# Patient Record
Sex: Female | Born: 1935 | Race: White | Hispanic: No | Smoking: Never smoker
Health system: Southern US, Community
[De-identification: ages and names within clinical notes are randomized; demographics above are authoritative.]

## PROBLEM LIST (undated history)

## (undated) DIAGNOSIS — M1990 Unspecified osteoarthritis, unspecified site: Secondary | ICD-10-CM

## (undated) DIAGNOSIS — K8020 Calculus of gallbladder without cholecystitis without obstruction: Secondary | ICD-10-CM

## (undated) DIAGNOSIS — K224 Dyskinesia of esophagus: Secondary | ICD-10-CM

## (undated) DIAGNOSIS — K589 Irritable bowel syndrome without diarrhea: Secondary | ICD-10-CM

## (undated) DIAGNOSIS — Z9889 Other specified postprocedural states: Secondary | ICD-10-CM

## (undated) DIAGNOSIS — Z8711 Personal history of peptic ulcer disease: Secondary | ICD-10-CM

## (undated) DIAGNOSIS — Z859 Personal history of malignant neoplasm, unspecified: Secondary | ICD-10-CM

## (undated) DIAGNOSIS — Z872 Personal history of diseases of the skin and subcutaneous tissue: Secondary | ICD-10-CM

## (undated) DIAGNOSIS — Z87442 Personal history of urinary calculi: Secondary | ICD-10-CM

## (undated) DIAGNOSIS — K5790 Diverticulosis of intestine, part unspecified, without perforation or abscess without bleeding: Secondary | ICD-10-CM

## (undated) DIAGNOSIS — I10 Essential (primary) hypertension: Secondary | ICD-10-CM

## (undated) DIAGNOSIS — M797 Fibromyalgia: Secondary | ICD-10-CM

## (undated) DIAGNOSIS — F419 Anxiety disorder, unspecified: Secondary | ICD-10-CM

## (undated) DIAGNOSIS — K311 Adult hypertrophic pyloric stenosis: Secondary | ICD-10-CM

## (undated) DIAGNOSIS — L03115 Cellulitis of right lower limb: Secondary | ICD-10-CM

## (undated) DIAGNOSIS — Z8719 Personal history of other diseases of the digestive system: Secondary | ICD-10-CM

## (undated) DIAGNOSIS — K635 Polyp of colon: Secondary | ICD-10-CM

## (undated) DIAGNOSIS — I639 Cerebral infarction, unspecified: Secondary | ICD-10-CM

## (undated) DIAGNOSIS — J309 Allergic rhinitis, unspecified: Secondary | ICD-10-CM

## (undated) DIAGNOSIS — E7800 Pure hypercholesterolemia, unspecified: Secondary | ICD-10-CM

## (undated) DIAGNOSIS — M719 Bursopathy, unspecified: Secondary | ICD-10-CM

## (undated) DIAGNOSIS — J189 Pneumonia, unspecified organism: Secondary | ICD-10-CM

## (undated) DIAGNOSIS — K219 Gastro-esophageal reflux disease without esophagitis: Secondary | ICD-10-CM

## (undated) HISTORY — DX: Personal history of malignant neoplasm, unspecified: Z98.890

## (undated) HISTORY — PX: DIAGNOSTIC MAMMOGRAM: HXRAD719

## (undated) HISTORY — DX: Calculus of gallbladder without cholecystitis without obstruction: K80.20

## (undated) HISTORY — DX: Personal history of diseases of the skin and subcutaneous tissue: Z87.2

## (undated) HISTORY — DX: Adult hypertrophic pyloric stenosis: K31.1

## (undated) HISTORY — DX: Polyp of colon: K63.5

## (undated) HISTORY — PX: CATARACT EXTRACTION W/ INTRAOCULAR LENS  IMPLANT, BILATERAL: SHX1307

## (undated) HISTORY — DX: Allergic rhinitis, unspecified: J30.9

## (undated) HISTORY — DX: Diverticulosis of intestine, part unspecified, without perforation or abscess without bleeding: K57.90

## (undated) HISTORY — DX: Personal history of malignant neoplasm, unspecified: Z85.9

## (undated) HISTORY — DX: Other specified postprocedural states: Z98.890

## (undated) HISTORY — DX: Dyskinesia of esophagus: K22.4

## (undated) HISTORY — PX: CHOLECYSTECTOMY OPEN: SUR202

## (undated) HISTORY — PX: TONSILLECTOMY: SUR1361

## (undated) HISTORY — DX: Irritable bowel syndrome, unspecified: K58.9

## (undated) HISTORY — PX: COLONOSCOPY: SHX174

## (undated) HISTORY — PX: APPENDECTOMY: SHX54

---

## 2014-10-04 DIAGNOSIS — I639 Cerebral infarction, unspecified: Secondary | ICD-10-CM

## 2014-10-04 HISTORY — DX: Cerebral infarction, unspecified: I63.9

## 2016-01-20 DIAGNOSIS — M797 Fibromyalgia: Secondary | ICD-10-CM | POA: Diagnosis not present

## 2016-01-20 DIAGNOSIS — K219 Gastro-esophageal reflux disease without esophagitis: Secondary | ICD-10-CM | POA: Diagnosis not present

## 2016-01-20 DIAGNOSIS — R1310 Dysphagia, unspecified: Secondary | ICD-10-CM | POA: Diagnosis not present

## 2016-01-20 DIAGNOSIS — M3500 Sicca syndrome, unspecified: Secondary | ICD-10-CM | POA: Diagnosis not present

## 2016-01-24 DIAGNOSIS — M160 Bilateral primary osteoarthritis of hip: Secondary | ICD-10-CM | POA: Diagnosis not present

## 2016-01-24 DIAGNOSIS — M797 Fibromyalgia: Secondary | ICD-10-CM | POA: Diagnosis not present

## 2016-01-24 DIAGNOSIS — M3500 Sicca syndrome, unspecified: Secondary | ICD-10-CM | POA: Diagnosis not present

## 2016-01-24 DIAGNOSIS — M7061 Trochanteric bursitis, right hip: Secondary | ICD-10-CM | POA: Diagnosis not present

## 2016-02-07 DIAGNOSIS — E7800 Pure hypercholesterolemia, unspecified: Secondary | ICD-10-CM | POA: Diagnosis not present

## 2016-02-14 DIAGNOSIS — R0609 Other forms of dyspnea: Secondary | ICD-10-CM | POA: Diagnosis not present

## 2016-02-14 DIAGNOSIS — I10 Essential (primary) hypertension: Secondary | ICD-10-CM | POA: Diagnosis not present

## 2016-02-14 DIAGNOSIS — J849 Interstitial pulmonary disease, unspecified: Secondary | ICD-10-CM | POA: Diagnosis not present

## 2016-02-14 DIAGNOSIS — E7800 Pure hypercholesterolemia, unspecified: Secondary | ICD-10-CM | POA: Diagnosis not present

## 2016-02-15 LAB — BASIC METABOLIC PANEL
BUN: 18 mg/dL (ref 4–21)
CREATININE: 0.7 mg/dL (ref 0.5–1.1)
Glucose: 93 mg/dL
Potassium: 4 mmol/L (ref 3.4–5.3)
Sodium: 139 mmol/L (ref 137–147)

## 2016-02-15 LAB — LIPID PANEL
HDL: 99 mg/dL — AB (ref 35–70)
LDL CALC: 114 mg/dL
Triglycerides: 140 mg/dL (ref 40–160)

## 2016-02-15 LAB — TSH: TSH: 1.11 u[IU]/mL (ref 0.41–5.90)

## 2016-02-15 LAB — CBC AND DIFFERENTIAL
HCT: 40 % (ref 36–46)
HEMOGLOBIN: 13.2 g/dL (ref 12.0–16.0)
Neutrophils Absolute: 6 /uL
PLATELETS: 233 10*3/uL (ref 150–399)
WBC: 8.9 10*3/mL

## 2016-02-15 LAB — HEPATIC FUNCTION PANEL
ALK PHOS: 57 U/L (ref 25–125)
ALT: 25 U/L (ref 7–35)
AST: 23 U/L (ref 13–35)
Bilirubin, Total: 0.4 mg/dL

## 2016-02-22 DIAGNOSIS — I351 Nonrheumatic aortic (valve) insufficiency: Secondary | ICD-10-CM | POA: Diagnosis not present

## 2016-02-22 DIAGNOSIS — I272 Other secondary pulmonary hypertension: Secondary | ICD-10-CM | POA: Diagnosis not present

## 2016-02-22 DIAGNOSIS — I340 Nonrheumatic mitral (valve) insufficiency: Secondary | ICD-10-CM | POA: Diagnosis not present

## 2016-02-22 DIAGNOSIS — R0609 Other forms of dyspnea: Secondary | ICD-10-CM | POA: Diagnosis not present

## 2016-02-23 DIAGNOSIS — M25551 Pain in right hip: Secondary | ICD-10-CM | POA: Diagnosis not present

## 2016-02-23 DIAGNOSIS — M3500 Sicca syndrome, unspecified: Secondary | ICD-10-CM | POA: Diagnosis not present

## 2016-02-23 DIAGNOSIS — M160 Bilateral primary osteoarthritis of hip: Secondary | ICD-10-CM | POA: Diagnosis not present

## 2016-02-23 DIAGNOSIS — G5700 Lesion of sciatic nerve, unspecified lower limb: Secondary | ICD-10-CM | POA: Diagnosis not present

## 2016-02-23 DIAGNOSIS — M62838 Other muscle spasm: Secondary | ICD-10-CM | POA: Diagnosis not present

## 2016-02-23 DIAGNOSIS — M25552 Pain in left hip: Secondary | ICD-10-CM | POA: Diagnosis not present

## 2016-02-23 DIAGNOSIS — M7630 Iliotibial band syndrome, unspecified leg: Secondary | ICD-10-CM | POA: Diagnosis not present

## 2016-02-23 DIAGNOSIS — M797 Fibromyalgia: Secondary | ICD-10-CM | POA: Diagnosis not present

## 2016-02-23 DIAGNOSIS — M7061 Trochanteric bursitis, right hip: Secondary | ICD-10-CM | POA: Diagnosis not present

## 2016-03-06 DIAGNOSIS — H35363 Drusen (degenerative) of macula, bilateral: Secondary | ICD-10-CM | POA: Diagnosis not present

## 2016-03-06 DIAGNOSIS — H43812 Vitreous degeneration, left eye: Secondary | ICD-10-CM | POA: Diagnosis not present

## 2016-03-06 DIAGNOSIS — H16213 Exposure keratoconjunctivitis, bilateral: Secondary | ICD-10-CM | POA: Diagnosis not present

## 2016-03-06 DIAGNOSIS — Z961 Presence of intraocular lens: Secondary | ICD-10-CM | POA: Diagnosis not present

## 2016-03-06 DIAGNOSIS — H10503 Unspecified blepharoconjunctivitis, bilateral: Secondary | ICD-10-CM | POA: Diagnosis not present

## 2016-03-06 DIAGNOSIS — H04123 Dry eye syndrome of bilateral lacrimal glands: Secondary | ICD-10-CM | POA: Diagnosis not present

## 2016-03-07 DIAGNOSIS — L578 Other skin changes due to chronic exposure to nonionizing radiation: Secondary | ICD-10-CM | POA: Diagnosis not present

## 2016-03-07 DIAGNOSIS — I10 Essential (primary) hypertension: Secondary | ICD-10-CM | POA: Diagnosis not present

## 2016-03-07 DIAGNOSIS — L820 Inflamed seborrheic keratosis: Secondary | ICD-10-CM | POA: Diagnosis not present

## 2016-03-07 DIAGNOSIS — L570 Actinic keratosis: Secondary | ICD-10-CM | POA: Diagnosis not present

## 2016-03-13 DIAGNOSIS — R0609 Other forms of dyspnea: Secondary | ICD-10-CM | POA: Diagnosis not present

## 2016-03-23 DIAGNOSIS — M62838 Other muscle spasm: Secondary | ICD-10-CM | POA: Diagnosis not present

## 2016-03-23 DIAGNOSIS — M797 Fibromyalgia: Secondary | ICD-10-CM | POA: Diagnosis not present

## 2016-03-23 DIAGNOSIS — M7061 Trochanteric bursitis, right hip: Secondary | ICD-10-CM | POA: Diagnosis not present

## 2016-03-23 DIAGNOSIS — M160 Bilateral primary osteoarthritis of hip: Secondary | ICD-10-CM | POA: Diagnosis not present

## 2016-03-23 DIAGNOSIS — G5700 Lesion of sciatic nerve, unspecified lower limb: Secondary | ICD-10-CM | POA: Diagnosis not present

## 2016-03-23 DIAGNOSIS — M7630 Iliotibial band syndrome, unspecified leg: Secondary | ICD-10-CM | POA: Diagnosis not present

## 2016-03-23 DIAGNOSIS — M3500 Sicca syndrome, unspecified: Secondary | ICD-10-CM | POA: Diagnosis not present

## 2016-04-03 HISTORY — PX: CARDIOVASCULAR STRESS TEST: SHX262

## 2016-04-05 DIAGNOSIS — E785 Hyperlipidemia, unspecified: Secondary | ICD-10-CM | POA: Diagnosis not present

## 2016-04-05 DIAGNOSIS — I340 Nonrheumatic mitral (valve) insufficiency: Secondary | ICD-10-CM | POA: Diagnosis not present

## 2016-04-05 DIAGNOSIS — I350 Nonrheumatic aortic (valve) stenosis: Secondary | ICD-10-CM | POA: Diagnosis not present

## 2016-04-05 DIAGNOSIS — I10 Essential (primary) hypertension: Secondary | ICD-10-CM | POA: Diagnosis not present

## 2016-04-05 DIAGNOSIS — R0600 Dyspnea, unspecified: Secondary | ICD-10-CM | POA: Diagnosis not present

## 2016-04-05 DIAGNOSIS — R079 Chest pain, unspecified: Secondary | ICD-10-CM | POA: Diagnosis not present

## 2016-04-05 DIAGNOSIS — I351 Nonrheumatic aortic (valve) insufficiency: Secondary | ICD-10-CM | POA: Diagnosis not present

## 2016-04-18 DIAGNOSIS — R0602 Shortness of breath: Secondary | ICD-10-CM | POA: Diagnosis not present

## 2016-04-18 DIAGNOSIS — R079 Chest pain, unspecified: Secondary | ICD-10-CM | POA: Diagnosis not present

## 2016-04-18 DIAGNOSIS — R0600 Dyspnea, unspecified: Secondary | ICD-10-CM | POA: Diagnosis not present

## 2016-05-04 DIAGNOSIS — M797 Fibromyalgia: Secondary | ICD-10-CM | POA: Diagnosis not present

## 2016-05-04 DIAGNOSIS — G5700 Lesion of sciatic nerve, unspecified lower limb: Secondary | ICD-10-CM | POA: Diagnosis not present

## 2016-05-04 DIAGNOSIS — M160 Bilateral primary osteoarthritis of hip: Secondary | ICD-10-CM | POA: Diagnosis not present

## 2016-05-04 DIAGNOSIS — M7630 Iliotibial band syndrome, unspecified leg: Secondary | ICD-10-CM | POA: Diagnosis not present

## 2016-05-04 DIAGNOSIS — M3500 Sicca syndrome, unspecified: Secondary | ICD-10-CM | POA: Diagnosis not present

## 2016-05-04 DIAGNOSIS — M7061 Trochanteric bursitis, right hip: Secondary | ICD-10-CM | POA: Diagnosis not present

## 2016-05-04 DIAGNOSIS — M62838 Other muscle spasm: Secondary | ICD-10-CM | POA: Diagnosis not present

## 2016-05-09 DIAGNOSIS — I350 Nonrheumatic aortic (valve) stenosis: Secondary | ICD-10-CM | POA: Diagnosis not present

## 2016-05-09 DIAGNOSIS — R0600 Dyspnea, unspecified: Secondary | ICD-10-CM | POA: Diagnosis not present

## 2016-05-09 DIAGNOSIS — I10 Essential (primary) hypertension: Secondary | ICD-10-CM | POA: Diagnosis not present

## 2016-05-09 DIAGNOSIS — E785 Hyperlipidemia, unspecified: Secondary | ICD-10-CM | POA: Diagnosis not present

## 2016-05-09 DIAGNOSIS — R079 Chest pain, unspecified: Secondary | ICD-10-CM | POA: Diagnosis not present

## 2016-05-09 DIAGNOSIS — I340 Nonrheumatic mitral (valve) insufficiency: Secondary | ICD-10-CM | POA: Diagnosis not present

## 2016-05-09 DIAGNOSIS — I351 Nonrheumatic aortic (valve) insufficiency: Secondary | ICD-10-CM | POA: Diagnosis not present

## 2016-05-15 DIAGNOSIS — R1310 Dysphagia, unspecified: Secondary | ICD-10-CM | POA: Diagnosis not present

## 2016-05-15 DIAGNOSIS — I10 Essential (primary) hypertension: Secondary | ICD-10-CM | POA: Diagnosis not present

## 2016-05-15 DIAGNOSIS — M3509 Sicca syndrome with other organ involvement: Secondary | ICD-10-CM | POA: Diagnosis not present

## 2016-05-15 DIAGNOSIS — K219 Gastro-esophageal reflux disease without esophagitis: Secondary | ICD-10-CM | POA: Diagnosis not present

## 2016-06-14 DIAGNOSIS — Z79899 Other long term (current) drug therapy: Secondary | ICD-10-CM | POA: Diagnosis not present

## 2016-06-15 DIAGNOSIS — R0600 Dyspnea, unspecified: Secondary | ICD-10-CM | POA: Diagnosis not present

## 2016-06-15 DIAGNOSIS — I10 Essential (primary) hypertension: Secondary | ICD-10-CM | POA: Diagnosis not present

## 2016-06-15 DIAGNOSIS — K219 Gastro-esophageal reflux disease without esophagitis: Secondary | ICD-10-CM | POA: Diagnosis not present

## 2016-06-15 DIAGNOSIS — R079 Chest pain, unspecified: Secondary | ICD-10-CM | POA: Diagnosis not present

## 2016-06-15 DIAGNOSIS — I351 Nonrheumatic aortic (valve) insufficiency: Secondary | ICD-10-CM | POA: Diagnosis not present

## 2016-06-15 DIAGNOSIS — I340 Nonrheumatic mitral (valve) insufficiency: Secondary | ICD-10-CM | POA: Diagnosis not present

## 2016-06-15 DIAGNOSIS — I350 Nonrheumatic aortic (valve) stenosis: Secondary | ICD-10-CM | POA: Diagnosis not present

## 2016-06-15 DIAGNOSIS — E785 Hyperlipidemia, unspecified: Secondary | ICD-10-CM | POA: Diagnosis not present

## 2016-06-19 DIAGNOSIS — M797 Fibromyalgia: Secondary | ICD-10-CM | POA: Diagnosis not present

## 2016-06-19 DIAGNOSIS — M160 Bilateral primary osteoarthritis of hip: Secondary | ICD-10-CM | POA: Diagnosis not present

## 2016-06-19 DIAGNOSIS — M7630 Iliotibial band syndrome, unspecified leg: Secondary | ICD-10-CM | POA: Diagnosis not present

## 2016-06-19 DIAGNOSIS — G5700 Lesion of sciatic nerve, unspecified lower limb: Secondary | ICD-10-CM | POA: Diagnosis not present

## 2016-06-19 DIAGNOSIS — M62838 Other muscle spasm: Secondary | ICD-10-CM | POA: Diagnosis not present

## 2016-06-19 DIAGNOSIS — M3500 Sicca syndrome, unspecified: Secondary | ICD-10-CM | POA: Diagnosis not present

## 2016-06-19 DIAGNOSIS — M7061 Trochanteric bursitis, right hip: Secondary | ICD-10-CM | POA: Diagnosis not present

## 2016-06-21 DIAGNOSIS — I10 Essential (primary) hypertension: Secondary | ICD-10-CM | POA: Diagnosis not present

## 2016-06-21 DIAGNOSIS — E7800 Pure hypercholesterolemia, unspecified: Secondary | ICD-10-CM | POA: Diagnosis not present

## 2016-07-10 DIAGNOSIS — M160 Bilateral primary osteoarthritis of hip: Secondary | ICD-10-CM | POA: Diagnosis not present

## 2016-07-10 DIAGNOSIS — M25552 Pain in left hip: Secondary | ICD-10-CM | POA: Diagnosis not present

## 2016-07-12 DIAGNOSIS — R109 Unspecified abdominal pain: Secondary | ICD-10-CM | POA: Diagnosis not present

## 2016-07-12 DIAGNOSIS — K219 Gastro-esophageal reflux disease without esophagitis: Secondary | ICD-10-CM | POA: Diagnosis not present

## 2016-07-12 DIAGNOSIS — I10 Essential (primary) hypertension: Secondary | ICD-10-CM | POA: Diagnosis not present

## 2016-07-12 DIAGNOSIS — M3500 Sicca syndrome, unspecified: Secondary | ICD-10-CM | POA: Diagnosis not present

## 2016-08-01 DIAGNOSIS — M47816 Spondylosis without myelopathy or radiculopathy, lumbar region: Secondary | ICD-10-CM | POA: Diagnosis not present

## 2016-08-01 DIAGNOSIS — G5700 Lesion of sciatic nerve, unspecified lower limb: Secondary | ICD-10-CM | POA: Diagnosis not present

## 2016-08-01 DIAGNOSIS — M7061 Trochanteric bursitis, right hip: Secondary | ICD-10-CM | POA: Diagnosis not present

## 2016-08-01 DIAGNOSIS — M7630 Iliotibial band syndrome, unspecified leg: Secondary | ICD-10-CM | POA: Diagnosis not present

## 2016-08-01 DIAGNOSIS — M160 Bilateral primary osteoarthritis of hip: Secondary | ICD-10-CM | POA: Diagnosis not present

## 2016-08-01 DIAGNOSIS — M3500 Sicca syndrome, unspecified: Secondary | ICD-10-CM | POA: Diagnosis not present

## 2016-08-01 DIAGNOSIS — M62838 Other muscle spasm: Secondary | ICD-10-CM | POA: Diagnosis not present

## 2016-08-01 DIAGNOSIS — M797 Fibromyalgia: Secondary | ICD-10-CM | POA: Diagnosis not present

## 2016-09-05 DIAGNOSIS — I10 Essential (primary) hypertension: Secondary | ICD-10-CM | POA: Diagnosis not present

## 2016-09-05 DIAGNOSIS — L918 Other hypertrophic disorders of the skin: Secondary | ICD-10-CM | POA: Diagnosis not present

## 2016-09-05 DIAGNOSIS — L570 Actinic keratosis: Secondary | ICD-10-CM | POA: Diagnosis not present

## 2016-09-05 DIAGNOSIS — L820 Inflamed seborrheic keratosis: Secondary | ICD-10-CM | POA: Diagnosis not present

## 2016-09-05 DIAGNOSIS — L578 Other skin changes due to chronic exposure to nonionizing radiation: Secondary | ICD-10-CM | POA: Diagnosis not present

## 2016-10-09 DIAGNOSIS — I10 Essential (primary) hypertension: Secondary | ICD-10-CM | POA: Diagnosis not present

## 2016-10-09 DIAGNOSIS — E7800 Pure hypercholesterolemia, unspecified: Secondary | ICD-10-CM | POA: Diagnosis not present

## 2016-10-10 LAB — CBC AND DIFFERENTIAL
HCT: 37 % (ref 36–46)
HEMATOCRIT: 37 % (ref 36–46)
Hemoglobin: 12.4 g/dL (ref 12.0–16.0)
Hemoglobin: 12.4 g/dL (ref 12.0–16.0)
NEUTROS ABS: 4 /uL
Neutrophils Absolute: 4 /uL
PLATELETS: 301 10*3/uL (ref 150–399)
Platelets: 301 10*3/uL (ref 150–399)
WBC: 5.7 10^3/mL
WBC: 5.7 10^3/mL

## 2016-10-10 LAB — HEPATIC FUNCTION PANEL
ALT: 14 U/L (ref 7–35)
AST: 22 U/L (ref 13–35)
Alkaline Phosphatase: 49 U/L (ref 25–125)
BILIRUBIN, TOTAL: 0.4 mg/dL

## 2016-10-10 LAB — BASIC METABOLIC PANEL
BUN: 17 mg/dL (ref 4–21)
Glucose: 109 mg/dL
POTASSIUM: 4.3 mmol/L (ref 3.4–5.3)
Sodium: 143 mmol/L (ref 137–147)

## 2016-10-12 DIAGNOSIS — R109 Unspecified abdominal pain: Secondary | ICD-10-CM | POA: Diagnosis not present

## 2016-10-12 DIAGNOSIS — K219 Gastro-esophageal reflux disease without esophagitis: Secondary | ICD-10-CM | POA: Diagnosis not present

## 2016-10-12 DIAGNOSIS — I10 Essential (primary) hypertension: Secondary | ICD-10-CM | POA: Diagnosis not present

## 2016-10-16 DIAGNOSIS — Z23 Encounter for immunization: Secondary | ICD-10-CM | POA: Diagnosis not present

## 2016-10-16 DIAGNOSIS — E7800 Pure hypercholesterolemia, unspecified: Secondary | ICD-10-CM | POA: Diagnosis not present

## 2016-10-16 DIAGNOSIS — I10 Essential (primary) hypertension: Secondary | ICD-10-CM | POA: Diagnosis not present

## 2016-10-16 DIAGNOSIS — M8580 Other specified disorders of bone density and structure, unspecified site: Secondary | ICD-10-CM | POA: Diagnosis not present

## 2016-11-01 DIAGNOSIS — M160 Bilateral primary osteoarthritis of hip: Secondary | ICD-10-CM | POA: Diagnosis not present

## 2016-11-01 DIAGNOSIS — M797 Fibromyalgia: Secondary | ICD-10-CM | POA: Diagnosis not present

## 2016-11-01 DIAGNOSIS — M47816 Spondylosis without myelopathy or radiculopathy, lumbar region: Secondary | ICD-10-CM | POA: Diagnosis not present

## 2016-11-01 DIAGNOSIS — G5700 Lesion of sciatic nerve, unspecified lower limb: Secondary | ICD-10-CM | POA: Diagnosis not present

## 2016-11-01 DIAGNOSIS — M62838 Other muscle spasm: Secondary | ICD-10-CM | POA: Diagnosis not present

## 2016-11-01 DIAGNOSIS — M3500 Sicca syndrome, unspecified: Secondary | ICD-10-CM | POA: Diagnosis not present

## 2016-11-01 DIAGNOSIS — M7630 Iliotibial band syndrome, unspecified leg: Secondary | ICD-10-CM | POA: Diagnosis not present

## 2016-11-01 DIAGNOSIS — M7061 Trochanteric bursitis, right hip: Secondary | ICD-10-CM | POA: Diagnosis not present

## 2016-11-10 DIAGNOSIS — M25552 Pain in left hip: Secondary | ICD-10-CM | POA: Diagnosis not present

## 2016-11-10 DIAGNOSIS — M1612 Unilateral primary osteoarthritis, left hip: Secondary | ICD-10-CM | POA: Diagnosis not present

## 2016-11-15 DIAGNOSIS — M47816 Spondylosis without myelopathy or radiculopathy, lumbar region: Secondary | ICD-10-CM | POA: Diagnosis not present

## 2016-11-15 DIAGNOSIS — M7061 Trochanteric bursitis, right hip: Secondary | ICD-10-CM | POA: Diagnosis not present

## 2016-11-15 DIAGNOSIS — G5700 Lesion of sciatic nerve, unspecified lower limb: Secondary | ICD-10-CM | POA: Diagnosis not present

## 2016-11-15 DIAGNOSIS — M797 Fibromyalgia: Secondary | ICD-10-CM | POA: Diagnosis not present

## 2016-11-15 DIAGNOSIS — M3500 Sicca syndrome, unspecified: Secondary | ICD-10-CM | POA: Diagnosis not present

## 2016-11-15 DIAGNOSIS — M62838 Other muscle spasm: Secondary | ICD-10-CM | POA: Diagnosis not present

## 2016-11-15 DIAGNOSIS — M7630 Iliotibial band syndrome, unspecified leg: Secondary | ICD-10-CM | POA: Diagnosis not present

## 2016-11-15 DIAGNOSIS — M160 Bilateral primary osteoarthritis of hip: Secondary | ICD-10-CM | POA: Diagnosis not present

## 2016-12-06 DIAGNOSIS — Z79899 Other long term (current) drug therapy: Secondary | ICD-10-CM | POA: Diagnosis not present

## 2016-12-13 DIAGNOSIS — M7061 Trochanteric bursitis, right hip: Secondary | ICD-10-CM | POA: Diagnosis not present

## 2016-12-13 DIAGNOSIS — M3500 Sicca syndrome, unspecified: Secondary | ICD-10-CM | POA: Diagnosis not present

## 2016-12-13 DIAGNOSIS — M7630 Iliotibial band syndrome, unspecified leg: Secondary | ICD-10-CM | POA: Diagnosis not present

## 2016-12-13 DIAGNOSIS — M797 Fibromyalgia: Secondary | ICD-10-CM | POA: Diagnosis not present

## 2016-12-13 DIAGNOSIS — G5700 Lesion of sciatic nerve, unspecified lower limb: Secondary | ICD-10-CM | POA: Diagnosis not present

## 2016-12-13 DIAGNOSIS — M62838 Other muscle spasm: Secondary | ICD-10-CM | POA: Diagnosis not present

## 2016-12-13 DIAGNOSIS — M47816 Spondylosis without myelopathy or radiculopathy, lumbar region: Secondary | ICD-10-CM | POA: Diagnosis not present

## 2016-12-13 DIAGNOSIS — M160 Bilateral primary osteoarthritis of hip: Secondary | ICD-10-CM | POA: Diagnosis not present

## 2016-12-18 DIAGNOSIS — M160 Bilateral primary osteoarthritis of hip: Secondary | ICD-10-CM | POA: Diagnosis not present

## 2016-12-22 DIAGNOSIS — E7800 Pure hypercholesterolemia, unspecified: Secondary | ICD-10-CM | POA: Diagnosis not present

## 2016-12-22 DIAGNOSIS — I10 Essential (primary) hypertension: Secondary | ICD-10-CM | POA: Diagnosis not present

## 2016-12-23 LAB — CBC AND DIFFERENTIAL
HCT: 36 % (ref 36–46)
HEMATOCRIT: 36 % (ref 36–46)
HEMOGLOBIN: 12.2 g/dL (ref 12.0–16.0)
Hemoglobin: 12.2 g/dL (ref 12.0–16.0)
Neutrophils Absolute: 4 /uL
PLATELETS: 396 10*3/uL (ref 150–399)
WBC: 5.9 10^3/mL
WBC: 5.9 10^3/mL

## 2016-12-23 LAB — BASIC METABOLIC PANEL
BUN: 14 mg/dL (ref 4–21)
Creatinine: 0.7 mg/dL (ref 0.5–1.1)
Glucose: 83 mg/dL
Glucose: 83 mg/dL
Potassium: 3.9 mmol/L (ref 3.4–5.3)
SODIUM: 141 mmol/L (ref 137–147)

## 2016-12-23 LAB — HEPATIC FUNCTION PANEL
ALT: 15 U/L (ref 7–35)
AST: 22 U/L (ref 13–35)
Alkaline Phosphatase: 62 U/L (ref 25–125)
BILIRUBIN, TOTAL: 0.3 mg/dL

## 2016-12-23 LAB — LIPID PANEL
CHOLESTEROL: 182 mg/dL (ref 0–200)
CHOLESTEROL: 182 mg/dL (ref 0–200)
HDL: 73 mg/dL — AB (ref 35–70)
LDL CALC: 83 mg/dL
TRIGLYCERIDES: 129 mg/dL (ref 40–160)

## 2016-12-26 DIAGNOSIS — I10 Essential (primary) hypertension: Secondary | ICD-10-CM | POA: Diagnosis not present

## 2016-12-26 DIAGNOSIS — F419 Anxiety disorder, unspecified: Secondary | ICD-10-CM | POA: Diagnosis not present

## 2016-12-26 DIAGNOSIS — E7800 Pure hypercholesterolemia, unspecified: Secondary | ICD-10-CM | POA: Diagnosis not present

## 2017-01-02 DIAGNOSIS — M797 Fibromyalgia: Secondary | ICD-10-CM | POA: Diagnosis not present

## 2017-01-02 DIAGNOSIS — M7630 Iliotibial band syndrome, unspecified leg: Secondary | ICD-10-CM | POA: Diagnosis not present

## 2017-01-02 DIAGNOSIS — M7061 Trochanteric bursitis, right hip: Secondary | ICD-10-CM | POA: Diagnosis not present

## 2017-01-02 DIAGNOSIS — M3500 Sicca syndrome, unspecified: Secondary | ICD-10-CM | POA: Diagnosis not present

## 2017-01-02 DIAGNOSIS — M47816 Spondylosis without myelopathy or radiculopathy, lumbar region: Secondary | ICD-10-CM | POA: Diagnosis not present

## 2017-01-02 DIAGNOSIS — G5700 Lesion of sciatic nerve, unspecified lower limb: Secondary | ICD-10-CM | POA: Diagnosis not present

## 2017-01-02 DIAGNOSIS — M160 Bilateral primary osteoarthritis of hip: Secondary | ICD-10-CM | POA: Diagnosis not present

## 2017-01-02 DIAGNOSIS — M62838 Other muscle spasm: Secondary | ICD-10-CM | POA: Diagnosis not present

## 2017-01-11 DIAGNOSIS — M1612 Unilateral primary osteoarthritis, left hip: Secondary | ICD-10-CM | POA: Diagnosis not present

## 2017-01-20 DIAGNOSIS — I10 Essential (primary) hypertension: Secondary | ICD-10-CM | POA: Diagnosis not present

## 2017-01-20 DIAGNOSIS — M79605 Pain in left leg: Secondary | ICD-10-CM | POA: Diagnosis not present

## 2017-01-20 DIAGNOSIS — M25552 Pain in left hip: Secondary | ICD-10-CM | POA: Diagnosis not present

## 2017-01-24 DIAGNOSIS — M7630 Iliotibial band syndrome, unspecified leg: Secondary | ICD-10-CM | POA: Diagnosis not present

## 2017-01-24 DIAGNOSIS — G5700 Lesion of sciatic nerve, unspecified lower limb: Secondary | ICD-10-CM | POA: Diagnosis not present

## 2017-01-24 DIAGNOSIS — M160 Bilateral primary osteoarthritis of hip: Secondary | ICD-10-CM | POA: Diagnosis not present

## 2017-01-24 DIAGNOSIS — M3500 Sicca syndrome, unspecified: Secondary | ICD-10-CM | POA: Diagnosis not present

## 2017-01-24 DIAGNOSIS — M797 Fibromyalgia: Secondary | ICD-10-CM | POA: Diagnosis not present

## 2017-01-24 DIAGNOSIS — M47816 Spondylosis without myelopathy or radiculopathy, lumbar region: Secondary | ICD-10-CM | POA: Diagnosis not present

## 2017-01-24 DIAGNOSIS — M7061 Trochanteric bursitis, right hip: Secondary | ICD-10-CM | POA: Diagnosis not present

## 2017-01-24 DIAGNOSIS — M62838 Other muscle spasm: Secondary | ICD-10-CM | POA: Diagnosis not present

## 2017-01-30 ENCOUNTER — Encounter (HOSPITAL_BASED_OUTPATIENT_CLINIC_OR_DEPARTMENT_OTHER): Payer: Self-pay | Admitting: Emergency Medicine

## 2017-01-30 ENCOUNTER — Inpatient Hospital Stay (HOSPITAL_BASED_OUTPATIENT_CLINIC_OR_DEPARTMENT_OTHER)
Admission: EM | Admit: 2017-01-30 | Discharge: 2017-02-01 | DRG: 872 | Disposition: A | Discharge status: Home / Self Care | Payer: Medicare Other | Attending: Internal Medicine | Admitting: Internal Medicine

## 2017-01-30 DIAGNOSIS — Z79899 Other long term (current) drug therapy: Secondary | ICD-10-CM | POA: Diagnosis not present

## 2017-01-30 DIAGNOSIS — R748 Abnormal levels of other serum enzymes: Secondary | ICD-10-CM

## 2017-01-30 DIAGNOSIS — Z8249 Family history of ischemic heart disease and other diseases of the circulatory system: Secondary | ICD-10-CM | POA: Diagnosis not present

## 2017-01-30 DIAGNOSIS — Z8673 Personal history of transient ischemic attack (TIA), and cerebral infarction without residual deficits: Secondary | ICD-10-CM | POA: Diagnosis not present

## 2017-01-30 DIAGNOSIS — I248 Other forms of acute ischemic heart disease: Secondary | ICD-10-CM | POA: Diagnosis present

## 2017-01-30 DIAGNOSIS — Z885 Allergy status to narcotic agent status: Secondary | ICD-10-CM

## 2017-01-30 DIAGNOSIS — A46 Erysipelas: Secondary | ICD-10-CM | POA: Diagnosis present

## 2017-01-30 DIAGNOSIS — Z9889 Other specified postprocedural states: Secondary | ICD-10-CM

## 2017-01-30 DIAGNOSIS — Z823 Family history of stroke: Secondary | ICD-10-CM | POA: Diagnosis not present

## 2017-01-30 DIAGNOSIS — Z7982 Long term (current) use of aspirin: Secondary | ICD-10-CM

## 2017-01-30 DIAGNOSIS — I10 Essential (primary) hypertension: Secondary | ICD-10-CM | POA: Diagnosis not present

## 2017-01-30 DIAGNOSIS — L03115 Cellulitis of right lower limb: Secondary | ICD-10-CM | POA: Diagnosis present

## 2017-01-30 DIAGNOSIS — A419 Sepsis, unspecified organism: Principal | ICD-10-CM | POA: Diagnosis present

## 2017-01-30 DIAGNOSIS — E876 Hypokalemia: Secondary | ICD-10-CM | POA: Diagnosis not present

## 2017-01-30 DIAGNOSIS — R778 Other specified abnormalities of plasma proteins: Secondary | ICD-10-CM | POA: Diagnosis present

## 2017-01-30 DIAGNOSIS — M797 Fibromyalgia: Secondary | ICD-10-CM | POA: Diagnosis present

## 2017-01-30 DIAGNOSIS — K580 Irritable bowel syndrome with diarrhea: Secondary | ICD-10-CM | POA: Diagnosis present

## 2017-01-30 DIAGNOSIS — Z9049 Acquired absence of other specified parts of digestive tract: Secondary | ICD-10-CM

## 2017-01-30 DIAGNOSIS — Z66 Do not resuscitate: Secondary | ICD-10-CM | POA: Diagnosis present

## 2017-01-30 DIAGNOSIS — R7989 Other specified abnormal findings of blood chemistry: Secondary | ICD-10-CM | POA: Diagnosis present

## 2017-01-30 DIAGNOSIS — Z886 Allergy status to analgesic agent status: Secondary | ICD-10-CM | POA: Diagnosis not present

## 2017-01-30 HISTORY — DX: Essential (primary) hypertension: I10

## 2017-01-30 HISTORY — DX: Bursopathy, unspecified: M71.9

## 2017-01-30 HISTORY — DX: Gastro-esophageal reflux disease without esophagitis: K21.9

## 2017-01-30 HISTORY — DX: Personal history of other diseases of the digestive system: Z87.19

## 2017-01-30 HISTORY — DX: Pneumonia, unspecified organism: J18.9

## 2017-01-30 HISTORY — DX: Other specified abnormal findings of blood chemistry: R79.89

## 2017-01-30 HISTORY — DX: Cellulitis of right lower limb: L03.115

## 2017-01-30 HISTORY — DX: Personal history of peptic ulcer disease: Z87.11

## 2017-01-30 HISTORY — DX: Pure hypercholesterolemia, unspecified: E78.00

## 2017-01-30 HISTORY — DX: Personal history of urinary calculi: Z87.442

## 2017-01-30 HISTORY — DX: Unspecified osteoarthritis, unspecified site: M19.90

## 2017-01-30 HISTORY — DX: Anxiety disorder, unspecified: F41.9

## 2017-01-30 HISTORY — DX: Fibromyalgia: M79.7

## 2017-01-30 HISTORY — DX: Cerebral infarction, unspecified: I63.9

## 2017-01-30 LAB — I-STAT CG4 LACTIC ACID, ED
LACTIC ACID, VENOUS: 2.66 mmol/L — AB (ref 0.5–1.9)
LACTIC ACID, VENOUS: 0.82 mmol/L (ref 0.5–1.9)

## 2017-01-30 LAB — BASIC METABOLIC PANEL
Anion gap: 11 (ref 5–15)
BUN: 20 mg/dL (ref 6–20)
CALCIUM: 9 mg/dL (ref 8.9–10.3)
CO2: 30 mmol/L (ref 22–32)
CREATININE: 0.76 mg/dL (ref 0.44–1.00)
Chloride: 95 mmol/L — ABNORMAL LOW (ref 101–111)
GFR calc Af Amer: >60 mL/min (ref >60)
GLUCOSE: 137 mg/dL — AB (ref 65–99)
Potassium: 2.6 mmol/L — CL (ref 3.5–5.1)
Sodium: 136 mmol/L (ref 135–145)

## 2017-01-30 LAB — CBC WITH DIFFERENTIAL/PLATELET
BASOS ABS: 0 10*3/uL (ref 0.0–0.1)
Basophils Relative: 0 %
EOS ABS: 0 10*3/uL (ref 0.0–0.7)
Eosinophils Relative: 0 %
HCT: 39.9 % (ref 36.0–46.0)
Hemoglobin: 13.2 g/dL (ref 12.0–15.0)
LYMPHS ABS: 0.9 10*3/uL (ref 0.7–4.0)
Lymphocytes Relative: 7 %
MCH: 31.1 pg (ref 26.0–34.0)
MCHC: 33.1 g/dL (ref 30.0–36.0)
MCV: 94.1 fL (ref 78.0–100.0)
Monocytes Absolute: 0.8 10*3/uL (ref 0.1–1.0)
Monocytes Relative: 6 %
NEUTROS ABS: 11.4 10*3/uL — AB (ref 1.7–7.7)
Neutrophils Relative %: 87 %
Platelets: 213 10*3/uL (ref 150–400)
RBC: 4.24 MIL/uL (ref 3.87–5.11)
RDW: 14.8 % (ref 11.5–15.5)
WBC: 13.1 10*3/uL — ABNORMAL HIGH (ref 4.0–10.5)

## 2017-01-30 LAB — MAGNESIUM: Magnesium: 1.8 mg/dL (ref 1.7–2.4)

## 2017-01-30 LAB — TROPONIN I
Troponin I: 0.08 ng/mL (ref <0.03)
Troponin I: 0.07 ng/mL (ref <0.03)

## 2017-01-30 MED ORDER — POTASSIUM CHLORIDE CRYS ER 20 MEQ PO TBCR
40.0000 meq | EXTENDED_RELEASE_TABLET | Freq: Once | ORAL | Status: AC
  Administered 2017-01-30: 40 meq via ORAL
  Filled 2017-01-30: qty 2

## 2017-01-30 MED ORDER — POTASSIUM CHLORIDE 10 MEQ/100ML IV SOLN
10.0000 meq | INTRAVENOUS | Status: AC
  Administered 2017-01-30 (×3): 10 meq via INTRAVENOUS
  Filled 2017-01-30 (×3): qty 100

## 2017-01-30 MED ORDER — ONDANSETRON HCL 4 MG/2ML IJ SOLN: 4.0000 mg | Freq: Four times a day (QID) | INTRAMUSCULAR | Status: DC | PRN

## 2017-01-30 MED ORDER — CEFAZOLIN IN D5W 1 GM/50ML IV SOLN
1.0000 g | Freq: Three times a day (TID) | INTRAVENOUS | Status: DC
Start: 2017-01-30 — End: 2017-02-01
  Administered 2017-01-30 – 2017-02-01 (×5): 1 g via INTRAVENOUS
  Filled 2017-01-30 (×6): qty 50

## 2017-01-30 MED ORDER — SODIUM CHLORIDE 0.9 % IV SOLN
INTRAVENOUS | Status: AC
  Administered 2017-01-30: 23:00:00 via INTRAVENOUS

## 2017-01-30 MED ORDER — SODIUM CHLORIDE 0.9% FLUSH
3.0000 mL | Freq: Two times a day (BID) | INTRAVENOUS | Status: DC
  Administered 2017-01-31 – 2017-02-01 (×3): 3 mL via INTRAVENOUS

## 2017-01-30 MED ORDER — TRAMADOL HCL 50 MG PO TABS: 50.0000 mg | ORAL_TABLET | Freq: Four times a day (QID) | ORAL | Status: DC | PRN

## 2017-01-30 MED ORDER — FAMOTIDINE 20 MG PO TABS
20.0000 mg | ORAL_TABLET | Freq: Two times a day (BID) | ORAL | Status: DC
  Administered 2017-01-30 – 2017-02-01 (×4): 20 mg via ORAL
  Filled 2017-01-30 (×4): qty 1

## 2017-01-30 MED ORDER — ACETAMINOPHEN 650 MG RE SUPP: 650.0000 mg | Freq: Four times a day (QID) | RECTAL | Status: DC | PRN

## 2017-01-30 MED ORDER — DIPHENOXYLATE-ATROPINE 2.5-0.025 MG PO TABS
1.0000 | ORAL_TABLET | Freq: Four times a day (QID) | ORAL | Status: DC | PRN
  Administered 2017-01-31: 1 via ORAL
  Filled 2017-01-30: qty 1

## 2017-01-30 MED ORDER — ASPIRIN 81 MG PO CHEW
324.0000 mg | CHEWABLE_TABLET | Freq: Once | ORAL | Status: AC
  Administered 2017-01-30: 324 mg via ORAL
  Filled 2017-01-30: qty 4

## 2017-01-30 MED ORDER — HYDROCHLOROTHIAZIDE 12.5 MG PO CAPS: 12.5000 mg | ORAL_CAPSULE | Freq: Every day | ORAL | Status: DC

## 2017-01-30 MED ORDER — MAGNESIUM OXIDE 400 (241.3 MG) MG PO TABS
400.0000 mg | ORAL_TABLET | Freq: Once | ORAL | Status: AC
  Administered 2017-01-30: 400 mg via ORAL
  Filled 2017-01-30: qty 1

## 2017-01-30 MED ORDER — ENSURE ENLIVE PO LIQD
237.0000 mL | Freq: Two times a day (BID) | ORAL | Status: DC
Start: 2017-01-31 — End: 2017-02-01
  Administered 2017-01-31: 237 mL via ORAL

## 2017-01-30 MED ORDER — ASPIRIN EC 325 MG PO TBEC
325.0000 mg | DELAYED_RELEASE_TABLET | Freq: Every day | ORAL | Status: DC
  Administered 2017-01-31 – 2017-02-01 (×2): 325 mg via ORAL
  Filled 2017-01-30 (×2): qty 1

## 2017-01-30 MED ORDER — ENOXAPARIN SODIUM 40 MG/0.4ML ~~LOC~~ SOLN
40.0000 mg | SUBCUTANEOUS | Status: DC
  Administered 2017-01-30 – 2017-01-31 (×2): 40 mg via SUBCUTANEOUS
  Filled 2017-01-30 (×2): qty 0.4

## 2017-01-30 MED ORDER — ACETAMINOPHEN 500 MG PO TABS
500.0000 mg | ORAL_TABLET | Freq: Once | ORAL | Status: AC
  Administered 2017-01-30: 500 mg via ORAL
  Filled 2017-01-30: qty 1

## 2017-01-30 MED ORDER — ONDANSETRON HCL 4 MG PO TABS: 4.0000 mg | ORAL_TABLET | Freq: Four times a day (QID) | ORAL | Status: DC | PRN

## 2017-01-30 MED ORDER — SODIUM CHLORIDE 0.9 % IV BOLUS (SEPSIS)
500.0000 mL | Freq: Once | INTRAVENOUS | Status: AC
  Administered 2017-01-30: 500 mL via INTRAVENOUS

## 2017-01-30 MED ORDER — ONDANSETRON 4 MG PO TBDP
4.0000 mg | ORAL_TABLET | Freq: Once | ORAL | Status: AC
  Administered 2017-01-30: 4 mg via ORAL
  Filled 2017-01-30: qty 1

## 2017-01-30 MED ORDER — ACETAMINOPHEN 325 MG PO TABS
650.0000 mg | ORAL_TABLET | Freq: Four times a day (QID) | ORAL | Status: DC | PRN
  Administered 2017-01-31 – 2017-02-01 (×3): 650 mg via ORAL
  Filled 2017-01-30 (×3): qty 2

## 2017-01-30 MED ORDER — DICYCLOMINE HCL 10 MG PO CAPS
10.0000 mg | ORAL_CAPSULE | Freq: Three times a day (TID) | ORAL | Status: DC
  Administered 2017-01-30 – 2017-02-01 (×6): 10 mg via ORAL
  Filled 2017-01-30 (×6): qty 1

## 2017-01-30 MED ORDER — ATORVASTATIN CALCIUM 10 MG PO TABS
10.0000 mg | ORAL_TABLET | Freq: Every day | ORAL | Status: DC
  Administered 2017-01-30 – 2017-02-01 (×3): 10 mg via ORAL
  Filled 2017-01-30 (×3): qty 1

## 2017-01-30 MED ORDER — CEFAZOLIN IN D5W 1 GM/50ML IV SOLN
1.0000 g | Freq: Once | INTRAVENOUS | Status: AC
  Administered 2017-01-30: 1 g via INTRAVENOUS
  Filled 2017-01-30: qty 50

## 2017-01-30 MED ORDER — FOSINOPRIL SODIUM-HCTZ 10-12.5 MG PO TABS: 1.0000 | ORAL_TABLET | Freq: Every day | ORAL | Status: DC

## 2017-01-30 MED ORDER — LISINOPRIL 10 MG PO TABS: 10.0000 mg | ORAL_TABLET | Freq: Every day | ORAL | Status: DC

## 2017-01-30 MED ORDER — ALPRAZOLAM 0.5 MG PO TABS
0.5000 mg | ORAL_TABLET | Freq: Every evening | ORAL | Status: DC | PRN
  Administered 2017-01-30 – 2017-01-31 (×2): 0.5 mg via ORAL
  Filled 2017-01-30 (×2): qty 1

## 2017-01-30 NOTE — ED Notes (Signed)
Attempt to call report nurse not available 

## 2017-01-30 NOTE — ED Notes (Signed)
Report given to [facility] 

## 2017-01-30 NOTE — ED Notes (Signed)
Pt now reports that she awoke feeling bad. Says she was nauseated but did not vomit and then had several episodes of diarrhea. Denies blood in stool. States she took her lomotil today.

## 2017-01-30 NOTE — Progress Notes (Signed)
   Patient coming from [facility] [place] for extensive cellulitis of the right lower extremity, elevated lactic acid, hypokalemia with EKG changes, and a vague chest pain complaint that does not appear to be related to ACS. Patient will be started on IV antibiotics, IV fluids and monitored under observation status on telemetry. Patient opted to [facility].  Linna Darner, MD [facility] 01/30/2017, 3:55 PM

## 2017-01-30 NOTE — H&P (Signed)
History and Physical  Patient Name: Susan [name]     [id number]    DOB: 03/25/1924    DOA: 01/30/2017 PCP: No PCP Per Patient   Patient coming from: Home  Chief Complaint: Foot pain and redness/swelling  HPI: Susan [name] is a 83 y.o. female with a past medical history significant for HTN, hx of stroke, IBS who presents with foot pain and redness for 1 week.  The patient was in her usual state of health until about a week ago when she hit her RIGHT shin and got an abrasion.  She subsequently developed swelling and pain of her right foot, swelling on the dorsum, then redness, then worsening pain.  Yesterday, she had severe pain with standing, then today she woke up with generalized malaise, nausea and so finally came to the ER.    ED course: -Temp 99.73F, heart rate 95, respirations 20, pulse ox normal, blood pressure 113/75 -Na 136, K 2.6, Cr 0.76, WBC 13.1K, Hgb 13.2 -Lactic acid 2.66 -Troponin 0.08 -Magnesium 1.8 -Blood cultures were obtained, cefazolin was given, aspirin 325 was given, 70 mEq of potassium were given, gentle fluids were given, and TRH rest except in transfer for cellulitis and early sepsis    With regard to elevated troponin, the patient denies chest pain per se, but does note that she got no sleep last night, normal metabolic heart was "pounding. She has no exertional dyspnea or exertional chest discomfort today. She is no previous history of coronary disease, although she did have a stroke some years ago, no residual deficits.  The patient has lived alone in [place] until about a week ago when she moved down to [place], to move into independent living at [facility].       ROS: Review of Systems  Constitutional: Positive for malaise/fatigue. Negative for chills and fever.  Cardiovascular: Positive for palpitations. Negative for chest pain.  Gastrointestinal: Positive for nausea.  Skin: Positive for rash (rednes and sselling of right foot).  All other  systems reviewed and are negative.         Past Medical History:  Diagnosis Date  . Anxiety   . Bursitis   . Fibromyalgia   . Hypertension   . Osteoarthritis     Past Surgical History:  Procedure Laterality Date  . APPENDECTOMY    . CHOLECYSTECTOMY    . TONSILLECTOMY      Social History: Patient lived alone in [place], still drives, but gave up her car at time of movign down here (which she is actually still in the process of doing, staying with her daguthers while they move things into her new indep living apartment at [employer]).  The patient walks unassisgted.  She is from [place] originally.  She is a never smoker.  Mostly a homemaker.  Independent with all ADLs and IADLs at baseline, no dementia.    Allergies  Allergen Reactions  . Codeine   . Morphine And Related     Family history: family history includes Heart attack in her father, maternal grandfather, maternal grandmother, mother, paternal grandfather, and paternal grandmother; Stroke in her father, maternal grandfather, maternal grandmother, mother, paternal grandfather, and paternal grandmother.  Prior to Admission medications   Medication Sig Start Date End Date Taking? Authorizing Provider  acetaminophen (TYLENOL) 650 MG CR tablet Take 650 mg by mouth every 8 (eight) hours as needed for pain.   Yes Historical Provider, MD  ALPRAZolam Duanne Moron) 0.5 MG tablet Take 0.5 mg by mouth at bedtime as  needed for anxiety.   Yes Historical Provider, MD  aspirin EC 325 MG tablet Take 325 mg by mouth daily.   Yes Historical Provider, MD  atorvastatin (LIPITOR) 10 MG tablet Take 10 mg by mouth daily.   Yes Historical Provider, MD  Biotin (BIOTIN 5000) 5 MG CAPS Take by mouth.   Yes Historical Provider, MD  calcium-vitamin D (OSCAL WITH D) 500-200 MG-UNIT tablet Take 1 tablet by mouth.   Yes Historical Provider, MD  dicyclomine (BENTYL) 10 MG capsule Take 10 mg by mouth 4 (four) times daily -  before meals and at bedtime.    Yes Historical Provider, MD  diphenoxylate-atropine (LOMOTIL) 2.5-0.025 MG tablet Take 1 tablet by mouth 4 (four) times daily as needed for diarrhea or loose stools.   Yes Historical Provider, MD  famotidine (PEPCID) 20 MG tablet Take 20 mg by mouth 2 (two) times daily.   Yes Historical Provider, MD  fexofenadine (ALLEGRA) 180 MG tablet Take 180 mg by mouth daily.   Yes Historical Provider, MD  fosinopril-hydrochlorothiazide (MONOPRIL-HCT) 10-12.5 MG tablet Take 1 tablet by mouth daily.   Yes Historical Provider, MD  loperamide (IMODIUM) 2 MG capsule Take 2 mg by mouth as needed for diarrhea or loose stools.   Yes Historical Provider, MD  traMADol (ULTRAM) 50 MG tablet Take 50 mg by mouth every 6 (six) hours as needed.   Yes Historical Provider, MD       Physical Exam: BP 104/64   Pulse 71   Temp 99.4 F (37.4 C) (Rectal)   Resp 17   Ht 5\' 1"  (1.549 m)   Wt 49 kg (108 lb)   SpO2 96%   BMI 20.41 kg/m  General appearance: Well-developed, elderly adult female, alert and in no acute distress.   Eyes: Anicteric, conjunctiva pink, lids and lashes normal. PERRL.    ENT: No nasal deformity, discharge, epistaxis.  Hearing diminished. OP moist without lesions.   Neck: No neck masses.  Trachea midline.  No thyromegaly/tenderness. Lymph: No cervical or supraclavicular lymphadenopathy. Skin: Warm and dry.  No jaundice.  No suspicious rashes or lesions other than pictured below on right foot:  Cardiac: Tachcyardic, nl [id number], soft systolic murmur.  Capillary refill is brisk.  JVP normal.  Trace pitting LE edema in right foot.  Radial and DP pulses 2+ and symmetric. Respiratory: Normal respiratory rate and rhythm.  CTAB without rales or wheezes. Abdomen: Abdomen soft.  No TTP. No ascites, distension, hepatosplenomegaly.   MSK: No deformities or effusions.  No cyanosis or clubbing. Neuro: Cranial nerves 3-12 intact.  Sensation intact to light touch. Speech is fluent.  Muscle strength normal.      Psych: Sensorium intact and responding to questions, attention normal.  Behavior appropriate.  Affect normal.  Judgment and insight appear normal.     Labs on Admission:  I have personally reviewed following labs and imaging studies: CBC:  Recent Labs Lab 01/30/17 1312  WBC 13.1*  NEUTROABS 11.4*  HGB 13.2  HCT 39.9  MCV 94.1  PLT 123456   Basic Metabolic Panel:  Recent Labs Lab 01/30/17 1312  NA 136  K 2.6*  CL 95*  CO2 30  GLUCOSE 137*  BUN 20  CREATININE 0.76  CALCIUM 9.0  MG 1.8   GFR: Estimated Creatinine Clearance: 35.3 mL/min (by C-G formula based on SCr of 0.76 mg/dL).  Liver Function Tests: No results for input(s): AST, ALT, ALKPHOS, BILITOT, PROT, ALBUMIN in the last 168 hours. No results for input(s): LIPASE,  AMYLASE in the last 168 hours. No results for input(s): AMMONIA in the last 168 hours. Coagulation Profile: No results for input(s): INR, PROTIME in the last 168 hours. Cardiac Enzymes:  Recent Labs Lab 01/30/17 1312  TROPONINI 0.08*   BNP (last 3 results) No results for input(s): PROBNP in the last 8760 hours. HbA1C: No results for input(s): HGBA1C in the last 72 hours. CBG: No results for input(s): GLUCAP in the last 168 hours. Lipid Profile: No results for input(s): CHOL, HDL, LDLCALC, TRIG, CHOLHDL, LDLDIRECT in the last 72 hours. Thyroid Function Tests: No results for input(s): TSH, T4TOTAL, FREET4, T3FREE, THYROIDAB in the last 72 hours. Anemia Panel: No results for input(s): VITAMINB12, FOLATE, FERRITIN, TIBC, IRON, RETICCTPCT in the last 72 hours. Sepsis Labs: Lactic acid 2.66 --> 0.8 on repeat within 6 hours Invalid input(s): PROCALCITONIN, LACTICIDVEN Recent Results (from the past 240 hour(s))  Blood culture (routine x 2)     Status: None (Preliminary result)   Collection Time: 01/30/17  1:42 PM  Result Value Ref Range Status   Specimen Description   Final    BLOOD LEFT ARM Performed at [facility], [address], [place], [place] [address]    Special Requests BOTTLES DRAWN AEROBIC AND ANAEROBIC [facility]  Final   Culture PENDING  Incomplete   Report Status PENDING  Incomplete  Blood culture (routine x 2)     Status: None (Preliminary result)   Collection Time: 01/30/17  1:42 PM  Result Value Ref Range Status   Specimen Description   Final    BLOOD RIGHT ARM Performed at [facility], [address], [place],  [address]    Special Requests BOTTLES DRAWN AEROBIC AND ANAEROBIC [facility]  Final   Culture PENDING  Incomplete   Report Status PENDING  Incomplete         Radiological Exams on Admission: Personally reviewed: No results found.  EKG: Independently reviewed. Rate 69, QTc 438, inferior and lateral ST depressions, no previous for comparison.      Assessment/Plan  1. Cellulitis/erysipelas of right foot, early sepsis:  Suspected source cellulitis. Organism unknown.   Patient meets criteria given tachycardia, tachypnea, leukocytosis, and evidence of organ dysfunction.  Lactate 2.66 mmol/L and repeat completed within 6 hours.  This patient is not at high risk of poor outcomes with a qSOFA score of 1.  Antibiotics delivered in the ED.    -Sepsis bundle utilized:  -Blood and urine cultures drawn  -Small bolus given in ED  -Antibiotics: cefazolin IV  -Repeat renal function and complete blood count in AM  -Check uric acid  -Elevate extremity     2. Hypokalemia:  Got 70 mEq K at [facility], mag low normal. -Repeat K tomorrow -Small oral mag suppl  3. Troponin:  Has hx of CV disease (stroke), no personal hx of MI.  Probably demand ischemia in setting of sepsis.  Has no PCP set up here in [place] yet.   -Trend troponin -Defer cards consult while in hospital vs after discharge pending troponin trend  4. Hypertension and stroke:  Normotensive at arrival. -Hold ACEi-HCTZ until hemodynamics  clearer -Continue aspirin 325 and statin  5. IBS:  -Continue bentyl scheduled and  Lomotil PRN  6. Other medications:  -Continue alprazolam -Continue H2RA      DVT prophylaxis: Lovenox  Code Status: DO NOT RESUSCITATE  Family Communication: Daughter at bedside, overnight plan discussed, CODE STATUS confirmed  Disposition Plan: Anticipate IV antibiotics, trend  cultures and monitor clinical status. Consults called: None Admission status: INPATIENT        Medical decision making: Patient seen at 9:42 PM on 01/30/2017.  What exists of the patient's chart was reviewed in depth and summarized above.  Clinical condition: improving symptoms, hemodynamicsally stable, BP soft and heart rate fast but stable for telemetry floor.        Edwin Dada [facility] Pager [phone]      At the time of admission, it appears that the appropriate admission status for this patient is INPATIENT. This is judged to be reasonable and necessary in order to provide the required intensity of service to ensure the patient's safety given the presenting symptoms, physical exam findings, and initial radiographic and laboratory data in the context of their chronic comorbidities.  Together, these circumstances are felt to place her at high risk for further clinical deterioration threatening life, limb, or organ.   Patient requires inpatient status due to high intensity of service, high risk for further deterioration and high frequency of surveillance required because of this acute illness that poses a threat to life, limb or bodily function.  Factors that support inpatient status include:  cellulitis with evidence of sepsis/organ injury (lactate 2.66, troponin 0.08) Likely derangements (hypokalemia, K2.6) Advanced age History of stroke  I certify that at the point of admission it is my clinical judgment that the patient will require inpatient hospital care spanning beyond 2 midnights from the point of admission and that early discharge would result in unnecessary risk of  decompensation and readmission or threat to life, limb or bodily function.

## 2017-01-30 NOTE — ED Provider Notes (Signed)
[facility] Provider Note   CSN: [id number] Arrival date & time: 01/30/17  1222     History   Chief Complaint Chief Complaint  Patient presents with  . Foot Pain  . Diarrhea  . Nausea    HPI Susan [name] is a 83 y.o. female.  HPI  83 year old female with right foot pain and swelling and redness over the past 48 hours. About for 5 days ago she bumped it in to a table and has a small abrasion. Since then has had a little clear drainage from the injury and then now has noticed swelling and redness. No fevers. Since last night patient has also had nausea. She feels like she wants to vomit but hasn't. She has also had multiple loose watery stools this morning. Had some mild abdominal cramping that is gone. The GI issues are recurrent issue for her. She was given Zofran earlier and feels much better. Denies chest pain or shortness of breath but she felt some palpitations last night.  Past Medical History:  Diagnosis Date  . Anxiety   . Bursitis   . Fibromyalgia   . Hypertension   . Osteoarthritis     Patient Active Problem List   Diagnosis Date Noted  . Cellulitis 01/30/2017    Past Surgical History:  Procedure Laterality Date  . APPENDECTOMY    . CHOLECYSTECTOMY    . TONSILLECTOMY      OB History    No data available       Home Medications    Prior to Admission medications   Medication Sig Start Date End Date Taking? Authorizing Provider  acetaminophen (TYLENOL) 650 MG CR tablet Take 650 mg by mouth every 8 (eight) hours as needed for pain.   Yes Historical Provider, MD  ALPRAZolam Duanne Moron) 0.5 MG tablet Take 0.5 mg by mouth at bedtime as needed for anxiety.   Yes Historical Provider, MD  aspirin EC 325 MG tablet Take 325 mg by mouth daily.   Yes Historical Provider, MD  atorvastatin (LIPITOR) 10 MG tablet Take 10 mg by mouth daily.   Yes Historical Provider, MD  Biotin (BIOTIN 5000) 5 MG CAPS Take by mouth.   Yes Historical Provider, MD    calcium-vitamin D (OSCAL WITH D) 500-200 MG-UNIT tablet Take 1 tablet by mouth.   Yes Historical Provider, MD  dicyclomine (BENTYL) 10 MG capsule Take 10 mg by mouth 4 (four) times daily -  before meals and at bedtime.   Yes Historical Provider, MD  diphenoxylate-atropine (LOMOTIL) 2.5-0.025 MG tablet Take 1 tablet by mouth 4 (four) times daily as needed for diarrhea or loose stools.   Yes Historical Provider, MD  famotidine (PEPCID) 20 MG tablet Take 20 mg by mouth 2 (two) times daily.   Yes Historical Provider, MD  fexofenadine (ALLEGRA) 180 MG tablet Take 180 mg by mouth daily.   Yes Historical Provider, MD  fosinopril-hydrochlorothiazide (MONOPRIL-HCT) 10-12.5 MG tablet Take 1 tablet by mouth daily.   Yes Historical Provider, MD  loperamide (IMODIUM) 2 MG capsule Take 2 mg by mouth as needed for diarrhea or loose stools.   Yes Historical Provider, MD  traMADol (ULTRAM) 50 MG tablet Take 50 mg by mouth every 6 (six) hours as needed.   Yes Historical Provider, MD    Family History No family history on file.  Social History Social History  Substance Use Topics  . Smoking status: Never Smoker  . Smokeless tobacco: Never Used  . Alcohol use No  Allergies   Codeine and Morphine and related   Review of Systems Review of Systems  Constitutional: Negative for fever.  Cardiovascular: Positive for palpitations. Negative for chest pain.  Gastrointestinal: Positive for abdominal pain, diarrhea and nausea. Negative for vomiting.  Musculoskeletal: Positive for myalgias.  Skin: Positive for color change and wound.  All other systems reviewed and are negative.    Physical Exam Updated Vital Signs BP 118/59   Pulse 67   Temp 99.4 F (37.4 C) (Rectal)   Resp 13   Ht 5\' 1"  (1.549 m)   Wt 108 lb (49 kg)   SpO2 98%   BMI 20.41 kg/m   Physical Exam  Constitutional: She is oriented to person, place, and time. She appears well-developed and well-nourished.  HENT:  Head:  Normocephalic and atraumatic.  Right Ear: External ear normal.  Left Ear: External ear normal.  Nose: Nose normal.  Eyes: Right eye exhibits no discharge. Left eye exhibits no discharge.  Cardiovascular: Normal rate, regular rhythm and normal heart sounds.   Pulses:      Dorsalis pedis pulses are 1+ on the right side, and 1+ on the left side.  Pulmonary/Chest: Effort normal and breath sounds normal.  Abdominal: Soft. There is no tenderness.  Musculoskeletal:       Right foot: There is tenderness (mild) and swelling.  See image below. Edema with redness in foot primarily tracking up to mid-shin. Small superificial abrasion to anterior mid shin  Neurological: She is alert and oriented to person, place, and time.  Skin: Skin is warm and dry.  Nursing note and vitals reviewed.      ED Treatments / Results  Labs (all labs ordered are listed, but only abnormal results are displayed) Labs Reviewed  BASIC METABOLIC PANEL - Abnormal; Notable for the following:       Result Value   Potassium 2.6 (*)    Chloride 95 (*)    Glucose, Bld 137 (*)    All other components within normal limits  CBC WITH DIFFERENTIAL/PLATELET - Abnormal; Notable for the following:    WBC 13.1 (*)    Neutro Abs 11.4 (*)    All other components within normal limits  TROPONIN I - Abnormal; Notable for the following:    Troponin I 0.08 (*)    All other components within normal limits  I-STAT CG4 LACTIC ACID, ED - Abnormal; Notable for the following:    Lactic Acid, Venous 2.66 (*)    All other components within normal limits  CULTURE, BLOOD (ROUTINE X 2)  CULTURE, BLOOD (ROUTINE X 2)  MAGNESIUM    EKG  EKG Interpretation  Date/Time:  Tuesday January 30 2017 13:44:51 EST Ventricular Rate:  69 PR Interval:    QRS Duration: 94 QT Interval:  401 QTC Calculation: 430 R Axis:   18 Text Interpretation:  Sinus rhythm Consider left atrial enlargement Probable LVH with secondary repol abnrm ST depr, consider  ischemia, inferior leads No old tracing to compare Confirmed by Taiten Brawn MD, Bhavesh Vazquez [phone]) on 01/30/2017 3:22:20 PM       Radiology No results found.  Procedures Procedures (including critical care time)  Medications Ordered in ED Medications  potassium chloride 10 mEq in 100 mL IVPB (10 mEq Intravenous New Bag/Given 01/30/17 1527)  ondansetron (ZOFRAN-ODT) disintegrating tablet 4 mg (4 mg Oral Given 01/30/17 1259)  sodium chloride 0.9 % bolus 500 mL (0 mLs Intravenous Stopped 01/30/17 1450)  ceFAZolin (ANCEF) IVPB 1 g/50 mL premix (0 g Intravenous  Stopped 01/30/17 1430)  potassium chloride SA (K-DUR,KLOR-CON) CR tablet 40 mEq (40 mEq Oral Given 01/30/17 1351)  acetaminophen (TYLENOL) tablet 500 mg (500 mg Oral Given 01/30/17 1414)  aspirin chewable tablet 324 mg (324 mg Oral Given 01/30/17 1527)     Initial Impression / Assessment and Plan / ED Course  I have reviewed the triage vital signs and the nursing notes.  Pertinent labs & imaging results that were available during my care of the patient were reviewed by me and considered in my medical decision making (see chart for details).  Clinical Course as of Jan 31 1556  Tue Jan 30, 2017  1319 Overall appears well. Will check rectal temp, labs, give fluids.  [SG]  1518 Patient is feeling better. However given her hypokalemia elevated white blood cell count, elevated lactate and mildly elevated troponin, I feel like observation is warranted. I don't think she is truly having a heart attack but she did have a nonspecific uncomfortable feeling in her chest with an abnormal ECG with no comparison. I think she's septic but she is 89 and could decompensate quicker. We'll admit to the hospitalist.  [SG]    Clinical Course User Index [SG] Sherwood Gambler, MD   Dr Marily Memos to admit, tele obs.  Final Clinical Impressions(s) / ED Diagnoses   Final diagnoses:  Cellulitis of right foot  Hypokalemia    New Prescriptions New Prescriptions   No  medications on file     Sherwood Gambler, MD 01/30/17 1557

## 2017-01-30 NOTE — ED Triage Notes (Signed)
Pt reports R foot pain and swelling since last week. Pt reports redness started yesterday. Pitting edema noted and hot to touch.

## 2017-01-31 LAB — BASIC METABOLIC PANEL
Anion gap: 4 — ABNORMAL LOW (ref 5–15)
BUN: 9 mg/dL (ref 6–20)
CO2: 29 mmol/L (ref 22–32)
Calcium: 8 mg/dL — ABNORMAL LOW (ref 8.9–10.3)
Chloride: 106 mmol/L (ref 101–111)
Creatinine, Ser: 0.56 mg/dL (ref 0.44–1.00)
GFR calc Af Amer: >60 mL/min (ref >60)
GLUCOSE: 99 mg/dL (ref 65–99)
POTASSIUM: 3.9 mmol/L (ref 3.5–5.1)
Sodium: 139 mmol/L (ref 135–145)

## 2017-01-31 LAB — CBC
HCT: 31.9 % — ABNORMAL LOW (ref 36.0–46.0)
Hemoglobin: 10.3 g/dL — ABNORMAL LOW (ref 12.0–15.0)
MCH: 30.6 pg (ref 26.0–34.0)
MCHC: 32.3 g/dL (ref 30.0–36.0)
MCV: 94.7 fL (ref 78.0–100.0)
PLATELETS: 183 10*3/uL (ref 150–400)
RBC: 3.37 MIL/uL — AB (ref 3.87–5.11)
RDW: 15 % (ref 11.5–15.5)
WBC: 8.6 10*3/uL (ref 4.0–10.5)

## 2017-01-31 LAB — TROPONIN I: Troponin I: 0.07 ng/mL (ref <0.03)

## 2017-01-31 LAB — URIC ACID: Uric Acid, Serum: 5.4 mg/dL (ref 2.3–6.6)

## 2017-01-31 NOTE — Progress Notes (Signed)
Initial Nutrition Assessment  DOCUMENTATION CODES:   Not applicable  INTERVENTION:  Continue Ensure Enlive po BID, each supplement provides 350 kcal and 20 grams of protein  Encourage adequate PO intake.   NUTRITION DIAGNOSIS:   Increased nutrient needs related to wound healing as evidenced by estimated needs.  GOAL:   Patient will meet greater than or equal to 90% of their needs  MONITOR:   PO intake, Supplement acceptance, Labs, Weight trends, Skin, I & O's  REASON FOR ASSESSMENT:   Malnutrition Screening Tool    ASSESSMENT:   83 y.o. female with a past medical history significant for HTN, hx of stroke, IBS who presents with foot pain and redness for 1 week.  Pt reports having a decreased appetite which has been ongoing over the past 2 week which she states is stress induced. Meal completion 25% this AM. Pt reports usually consuming at least 2 meals a day with an Ensure shake at least once daily. Usual body weight unknown to patient however reports having a 5 lb weight loss over the past 2 months (weight loss not significant for time frame). Pt currently has Ensure ordered. RD to continue with current orders. Pt encouraged to eat her food at meals. Pt educated on the importance of protein intake.   Nutrition-Focused physical exam completed. Findings are no fat depletion, moderate to severe muscle depletion, and moderate edema.   Labs and medications reviewed.   Diet Order:  Diet Heart Room service appropriate? Yes; Fluid consistency: Thin  Skin:  Wound (see comment) (cellulitis R foot)  Last BM:  2/28  Height:   Ht Readings from Last 1 Encounters:  01/30/17 5\' 1"  (1.549 m)    Weight:   Wt Readings from Last 1 Encounters:  01/30/17 108 lb (49 kg)    Ideal Body Weight:  47.7 kg  BMI:  Body mass index is 20.41 kg/m.  Estimated Nutritional Needs:   Kcal:  1400-1600  Protein:  55-65 grams  Fluid:  >/= 1.5 L/day  EDUCATION NEEDS:   Education needs  addressed  Corrin Parker, MS, RD, LDN Pager # [phone] After hours/ weekend pager # [phone]

## 2017-01-31 NOTE — Progress Notes (Signed)
PROGRESS NOTE  Susan [name]  MEQ:[id number] DOB: Apr 25, 1924 DOA: 01/30/2017 PCP: Pcp Not In System Outpatient Specialists:  Subjective: Feels much better, redness and swelling improving, continue antibiotics and elevation.  Brief Narrative:  Susan [name] is a 83 y.o. female with a past medical history significant for HTN, hx of stroke, IBS who presents with foot pain and redness for 1 week.  The patient was in her usual state of health until about a week ago when she hit her RIGHT shin and got an abrasion.  She subsequently developed swelling and pain of her right foot, swelling on the dorsum, then redness, then worsening pain.  Yesterday, she had severe pain with standing, then today she woke up with generalized malaise, nausea and so finally came to the ER.    Assessment & Plan:   Principal Problem:   Cellulitis of right foot Active Problems:   Hypokalemia   Essential hypertension   Elevated troponin   Cellulitis/erysipelas of right foot, early sepsis:  -Suspected source cellulitis. Organism unknown.  -Met criteria on admission with tachycardia, leukocytosis and evidence of organ dysfunction with elevated lactate of 2.66.  -Sepsis bundle utilized:             -Blood and urine cultures drawn             -Small bolus given in ED             -Antibiotics: cefazolin IV             -Repeat renal function and complete blood count in AM             -Check uric acid             -Elevate extremity -Ambulate.  Hypokalemia:  -Severe hypokalemia with potassium of 2.6, this is repleted with oral supplements, potassium today 3.9.  Troponin elevation   -Troponin slightly elevated at 0.07, 3 sets showed flat trend. -The pattern of troponin elevation is consistent with demand ischemia secondary to sepsis rather than it is ACS. -Continue aspirin and statins.  Hypertension and stroke:  Normotensive at arrival. -Hold ACEi-HCTZ until hemodynamics  clearer -Continue aspirin 325 and  statin  IBS:  -Continue bentyl scheduled and Lomotil PRN  Other medications:  -Continue alprazolam -Continue H2RA    DVT prophylaxis: Subcutaneous heparin Code Status: Full Code Family Communication:  Disposition Plan:  Diet: Diet Heart Room service appropriate? Yes; Fluid consistency: Thin  Consultants:   None   Procedures:   None  Antimicrobials:   Cefazolin  Objective: Vitals:   01/30/17 1830 01/30/17 1900 01/31/17 0217 01/31/17 0500  BP: 102/70 104/64  (!) 126/56  Pulse: 72 71  60  Resp: _0 Temp:   98.5 F (36.9 C) 98.5 F (36.9 C)  TempSrc:   Oral Oral  SpO2: 99% 96%  97%  Weight:      Height:        Intake/Output Summary (Last 24 hours) at 01/31/17 1121 Last data filed at 01/31/17 0900  Gross per 24 hour  Intake              770 ml  Output                0 ml  Net              770 ml   Filed Weights   01/30/17 1234  Weight: 49 kg (108 lb)    Examination: General exam: Appears calm and comfortable  Respiratory system: Clear to auscultation. Respiratory effort normal. Cardiovascular system: S1 & S2 heard, RRR. No JVD, murmurs, rubs, gallops or clicks. No pedal edema. Gastrointestinal system: Abdomen is nondistended, soft and nontender. No organomegaly or masses felt. Normal bowel sounds heard. Central nervous system: Alert and oriented. No focal neurological deficits. Extremities: Symmetric 5 x 5 power. Skin: No rashes, lesions or ulcers Psychiatry: Judgement and insight appear normal. Mood & affect appropriate.   Data Reviewed: I have personally reviewed following labs and imaging studies  CBC:  Recent Labs Lab 01/30/17 1312 01/31/17 0236  WBC 13.1* 8.6  NEUTROABS 11.4*  --   HGB 13.2 10.3*  HCT 39.9 31.9*  MCV 94.1 94.7  PLT 213 628   Basic Metabolic Panel:  Recent Labs Lab 01/30/17 1312 01/31/17 0236  NA 136 139  K 2.6* 3.9  CL 95* 106  CO2 30 29  GLUCOSE 137* 99  BUN 20 9  CREATININE 0.76 0.56  CALCIUM  9.0 8.0*  MG 1.8  --    GFR: Estimated Creatinine Clearance: 35.3 mL/min (by C-G formula based on SCr of 0.56 mg/dL). Liver Function Tests: No results for input(s): AST, ALT, ALKPHOS, BILITOT, PROT, ALBUMIN in the last 168 hours. No results for input(s): LIPASE, AMYLASE in the last 168 hours. No results for input(s): AMMONIA in the last 168 hours. Coagulation Profile: No results for input(s): INR, PROTIME in the last 168 hours. Cardiac Enzymes:  Recent Labs Lab 01/30/17 1312 01/30/17 2149 01/31/17 0236  TROPONINI 0.08* 0.07* 0.07*   BNP (last 3 results) No results for input(s): PROBNP in the last 8760 hours. HbA1C: No results for input(s): HGBA1C in the last 72 hours. CBG: No results for input(s): GLUCAP in the last 168 hours. Lipid Profile: No results for input(s): CHOL, HDL, LDLCALC, TRIG, CHOLHDL, LDLDIRECT in the last 72 hours. Thyroid Function Tests: No results for input(s): TSH, T4TOTAL, FREET4, T3FREE, THYROIDAB in the last 72 hours. Anemia Panel: No results for input(s): VITAMINB12, FOLATE, FERRITIN, TIBC, IRON, RETICCTPCT in the last 72 hours. Urine analysis: No results found for: COLORURINE, APPEARANCEUR, [place], [place], GLUCOSEU, HGBUR, BILIRUBINUR, KETONESUR, PROTEINUR, UROBILINOGEN, NITRITE, LEUKOCYTESUR Sepsis Labs: _0 (procalcitonin:4,lacticidven:4)  ) Recent Results (from the past 240 hour(s))  Blood culture (routine x 2)     Status: None (Preliminary result)   Collection Time: 01/30/17  1:42 PM  Result Value Ref Range Status   Specimen Description   Final    BLOOD LEFT ARM Performed at [facility], [address], [place], [place] [address]    Special Requests BOTTLES DRAWN AEROBIC AND ANAEROBIC [facility] EACH  Final   Culture PENDING  Incomplete   Report Status PENDING  Incomplete  Blood culture (routine x 2)     Status: None (Preliminary result)   Collection Time: 01/30/17  1:42 PM  Result Value Ref Range Status   Specimen Description    Final    BLOOD RIGHT ARM Performed at [facility], [address], [place], [place] [address]    Special Requests BOTTLES DRAWN AEROBIC AND ANAEROBIC [facility] EACH  Final   Culture PENDING  Incomplete   Report Status PENDING  Incomplete     Invalid input(s): PROCALCITONIN, LACTICACIDVEN   Radiology Studies: No results found.      Scheduled Meds: . aspirin EC  325 mg Oral Daily  . atorvastatin  10 mg Oral Daily  .  ceFAZolin (ANCEF) IV  1 g Intravenous Q8H  . dicyclomine  10 mg Oral TID  AC & HS  . enoxaparin (LOVENOX) injection  40 mg Subcutaneous Q24H  . famotidine  20 mg Oral BID  . feeding supplement (ENSURE ENLIVE)  237 mL Oral BID BM  . sodium chloride flush  3 mL Intravenous Q12H   Continuous Infusions:   LOS: 1 day    Time spent: 35 minutes    Kylena Mole A, MD [facility] Pager [phone]  If 7PM-7AM, please contact night-coverage [email or web address] Password [email or web address] 01/31/2017, 11:21 AM

## 2017-02-01 LAB — HEMOGLOBIN A1C
Hgb A1c MFr Bld: 5.6 % (ref 4.8–5.6)
Mean Plasma Glucose: 114 mg/dL

## 2017-02-01 MED ORDER — CEPHALEXIN 500 MG PO CAPS: 500.0000 mg | ORAL_CAPSULE | Freq: Three times a day (TID) | ORAL | 0 refills | Status: DC

## 2017-02-01 MED ORDER — DOXYCYCLINE HYCLATE 100 MG PO TABS: 100.0000 mg | ORAL_TABLET | Freq: Two times a day (BID) | ORAL | 0 refills | Status: DC

## 2017-02-01 NOTE — Plan of Care (Signed)
Problem: Skin Integrity: Goal: Risk for impaired skin integrity will decrease Outcome: Not Progressing Multiple abraisions noted to arms and legs and covered with band aids  Problem: Tissue Perfusion: Goal: Risk factors for ineffective tissue perfusion will decrease Outcome: Progressing No S/S of DVT noted  Problem: Activity: Goal: Risk for activity intolerance will decrease Outcome: Progressing Ambulates to BR with minimum assistance, tolerates well  Problem: Bowel/Gastric: Goal: Will not experience complications related to bowel motility Outcome: Progressing No bowel issues reported

## 2017-02-01 NOTE — Progress Notes (Signed)
Responded to [facility] Consult to assist with AD/HPOA.  Patient has AD from [place]. I instructed her to give her nurse a copy for her chart. I also spoke with her nurse to insure that copy was put in chart.  Patient was very pleasant and said she was feeling very well today and was excited about being discharged today. She also wanted a AD form from [facility] to consider possibly changing later after talking with family.  Chaplain available as needed.   02/01/17 1038  Clinical Encounter Type  Visited With Patient;Health care provider  Visit Type Initial;Spiritual support  Referral From Nurse  Spiritual Encounters  Spiritual Needs Literature;Emotional  Stress Factors  Patient Stress Factors None identified  Advance Directives (For Healthcare)  Does Patient Have a Medical Advance Directive? Yes  Type of Advance Directive Healthcare Power of Susan [name], [place]

## 2017-02-01 NOTE — Discharge Summary (Signed)
Physician Discharge Summary  [name]:[id number] DOB: [birth year] DOA: 01/30/2017  PCP: Pcp Not In System  Admit date: 01/30/2017 Discharge date: 02/01/2017  Admitted From: Home Disposition: Home  Recommendations for Outpatient Follow-up:  1. Follow up with PCP in 1-2 weeks 2. Please obtain BMP/CBC in one week 3. Continue antibiotics for 1 more week.  Home Health: NA Equipment/Devices:NA  Discharge Condition: Stable CODE STATUS: Full Code Diet recommendation: Diet Heart Room service appropriate? Yes; Fluid consistency: Thin Diet - low sodium heart healthy  Brief/Interim Summary: Susan [name] a 83 y.o.femalewith a past medical history significant for HTN, hx of stroke, IBSwho presents with foot pain and redness for 1 week. The patient was in her usual state of health until about a week ago when she hit her RIGHT shin and got an abrasion. She subsequently developed swelling and pain of her right foot, swelling on the dorsum, then redness, then worsening pain. Yesterday, she had severe pain with standing, then today she woke up with generalized malaise, nausea and so finally came to the ER.    Discharge Diagnoses:  Principal Problem:   Cellulitis of right foot Active Problems:   Hypokalemia   Essential hypertension   Elevated troponin   Cellulitis/erysipelasof right foot, early sepsis: -Suspected source cellulitis. Organism unknown.  -Met criteria on admission with tachycardia, leukocytosis and evidence of organ dysfunction with elevated lactate of 2.66.  -Sepsis bundle utilized with IV fluids and IV antibiotics. Lactate back to normal at 0.82. -Sepsis physiology resolved. -Patient was in cefazolin, on discharge doxycycline and Keflex for one more week.  Hypokalemia: -Severe hypokalemia with potassium of 2.6, this is repleted with oral supplements, potassium today 3.9.  Troponin elevation  -Troponin slightly elevated at 0.08--> 0.07-->0.07, 3 sets  showed flat trend. -The pattern of troponin elevation is consistent with demand ischemia secondary to sepsis rather than it is ACS. -Continue aspirin and statins. No further workup.  Hypertension and stroke: Normotensive at arrival. -Hold ACEi-HCTZ until hemodynamics clearer -Continue aspirin 325 and statin  IBS: -Continue bentyl scheduled and Lomotil PRN  Other medications: -Continue alprazolam -Continue H2RA   Discharge Instructions  Discharge Instructions    Diet - low sodium heart healthy    Complete by:  As directed    Increase activity slowly    Complete by:  As directed      Allergies as of 02/01/2017      Reactions   Butrans [buprenorphine] Other (See Comments)   Severe chest pains   Codeine Other (See Comments)   Severe Chest pains   Morphine And Related Other (See Comments)   Severe chest pains      Medication List    TAKE these medications   acetaminophen 650 MG CR tablet Commonly known as:  TYLENOL Take 650 mg by mouth every 8 (eight) hours as needed for pain.   ALPRAZolam 0.5 MG tablet Commonly known as:  XANAX Take 0.5 mg by mouth at bedtime as needed for anxiety.   aspirin EC 325 MG tablet Take 325 mg by mouth daily.   atorvastatin 10 MG tablet Commonly known as:  LIPITOR Take 10 mg by mouth daily.   BIOTIN 5000 5 MG Caps Generic drug:  Biotin Take 5 mg by mouth daily.   calcium-vitamin D 500-200 MG-UNIT tablet Commonly known as:  OSCAL WITH D Take 1 tablet by mouth.   cephALEXin 500 MG capsule Commonly known as:  KEFLEX Take 1 capsule (500 mg total) by mouth 3 (three) times daily.  cyclobenzaprine 5 MG tablet Commonly known as:  FLEXERIL Take 5 mg by mouth 3 (three) times daily as needed for spasms.   dicyclomine 10 MG capsule Commonly known as:  BENTYL Take 10 mg by mouth 2 (two) times daily.   diphenoxylate-atropine 2.5-0.025 MG tablet Commonly known as:  LOMOTIL Take 1 tablet by mouth 4 (four) times daily as needed  for diarrhea or loose stools.   doxycycline 100 MG tablet Commonly known as:  VIBRA-TABS Take 1 tablet (100 mg total) by mouth 2 (two) times daily.   Enalapril-Hydrochlorothiazide 5-12.5 MG tablet Take 1 tablet by mouth daily.   famotidine 20 MG tablet Commonly known as:  PEPCID Take 20 mg by mouth 2 (two) times daily.   fexofenadine 180 MG tablet Commonly known as:  ALLEGRA Take 180 mg by mouth daily.   loperamide 2 MG capsule Commonly known as:  IMODIUM Take 2 mg by mouth as needed for diarrhea or loose stools.   traMADol 50 MG tablet Commonly known as:  ULTRAM Take 50 mg by mouth every 6 (six) hours as needed for moderate pain.       Allergies  Allergen Reactions  . Butrans [Buprenorphine] Other (See Comments)    Severe chest pains  . Codeine Other (See Comments)    Severe Chest pains  . Morphine And Related Other (See Comments)    Severe chest pains    Consultations:  None  Procedures None  Radiological studies:  No results found.  Subjective:  Discharge Exam: Vitals:   01/31/17 0500 01/31/17 1500 01/31/17 2000 02/01/17 0630  BP: (!) 126/56 (!) 124/52 (!) 142/68 (!) 115/50  Pulse: 60 62 69 71  Resp: '18 18 18   ' Temp: 98.5 F (36.9 C) 98.3 F (36.8 C) 99 F (37.2 C) 98.9 F (37.2 C)  TempSrc: Oral Oral Oral Oral  SpO2: 97% 98% 96% 96%  Weight:      Height:       General: Pt is alert, awake, not in acute distress Cardiovascular: RRR, S1/S2 +, no rubs, no gallops Respiratory: CTA bilaterally, no wheezing, no rhonchi Abdominal: Soft, NT, ND, bowel sounds + Extremities: no edema, no cyanosis   The results of significant diagnostics from this hospitalization (including imaging, microbiology, ancillary and laboratory) are listed below for reference.    Microbiology: Recent Results (from the past 240 hour(s))  Blood culture (routine x 2)     Status: None (Preliminary result)   Collection Time: 01/30/17  1:42 PM  Result Value Ref Range Status    Specimen Description BLOOD LEFT ARM  Final   Special Requests BOTTLES DRAWN AEROBIC AND ANAEROBIC 5CC EACH  Final   Culture   Final    NO GROWTH < 24 HOURS Performed at [facility], [place] [address], [place], [place] [address]    Report Status PENDING  Incomplete  Blood culture (routine x 2)     Status: None (Preliminary result)   Collection Time: 01/30/17  1:42 PM  Result Value Ref Range Status   Specimen Description BLOOD RIGHT ARM  Final   Special Requests BOTTLES DRAWN AEROBIC AND ANAEROBIC 5CC EACH  Final   Culture   Final    NO GROWTH < 24 HOURS Performed at [facility], [place] [address] [place], [place] [address]    Report Status PENDING  Incomplete     Labs: BNP (last 3 results) No results for input(s): BNP in the last 8760 hours. Basic Metabolic Panel:  Recent  Labs Lab 01/30/17 1312 01/31/17 0236  NA 136 139  K 2.6* 3.9  CL 95* 106  CO2 30 29  GLUCOSE 137* 99  BUN 20 9  CREATININE 0.76 0.56  CALCIUM 9.0 8.0*  MG 1.8  --    Liver Function Tests: No results for input(s): AST, ALT, ALKPHOS, BILITOT, PROT, ALBUMIN in the last 168 hours. No results for input(s): LIPASE, AMYLASE in the last 168 hours. No results for input(s): AMMONIA in the last 168 hours. CBC:  Recent Labs Lab 01/30/17 1312 01/31/17 0236  WBC 13.1* 8.6  NEUTROABS 11.4*  --   HGB 13.2 10.3*  HCT 39.9 31.9*  MCV 94.1 94.7  PLT 213 183   Cardiac Enzymes:  Recent Labs Lab 01/30/17 1312 01/30/17 2149 01/31/17 0236  TROPONINI 0.08* 0.07* 0.07*   BNP: Invalid input(s): POCBNP CBG: No results for input(s): GLUCAP in the last 168 hours. D-Dimer No results for input(s): DDIMER in the last 72 hours. Hgb A1c  Recent Labs  01/31/17 0236  HGBA1C 5.6   Lipid Profile No results for input(s): CHOL, HDL, LDLCALC, TRIG, CHOLHDL, LDLDIRECT in the last 72 hours. Thyroid function studies No results for input(s): TSH, T4TOTAL, T3FREE, THYROIDAB in the last 72  hours.  Invalid input(s): FREET3 Anemia work up No results for input(s): VITAMINB12, FOLATE, FERRITIN, TIBC, IRON, RETICCTPCT in the last 72 hours. Urinalysis No results found for: COLORURINE, APPEARANCEUR, [place], [place], [place], [place], [place], [place], PROTEINUR, UROBILINOGEN, NITRITE, LEUKOCYTESUR Sepsis Labs Invalid input(s): PROCALCITONIN,  WBC,  LACTICIDVEN Microbiology Recent Results (from the past 240 hour(s))  Blood culture (routine x 2)     Status: None (Preliminary result)   Collection Time: 01/30/17  1:42 PM  Result Value Ref Range Status   Specimen Description BLOOD LEFT ARM  Final   Special Requests BOTTLES DRAWN AEROBIC AND ANAEROBIC 5CC EACH  Final   Culture   Final    NO GROWTH < 24 HOURS Performed at [facility], [place] [address] [place], [place] [address]    Report Status PENDING  Incomplete  Blood culture (routine x 2)     Status: None (Preliminary result)   Collection Time: 01/30/17  1:42 PM  Result Value Ref Range Status   Specimen Description BLOOD RIGHT ARM  Final   Special Requests BOTTLES DRAWN AEROBIC AND ANAEROBIC 5CC EACH  Final   Culture   Final    NO GROWTH < 24 HOURS Performed at [facility], [place] [address], [place], [place] [address]    Report Status PENDING  Incomplete     Time coordinating discharge: Over 30 minutes  SIGNED:   Birdie Hopes, MD  [facility] 02/01/2017, 10:32 AM Pager   If 7PM-7AM, please contact night-coverage [email or web address] Password [email or web address]

## 2017-02-04 LAB — CULTURE, BLOOD (ROUTINE X 2)
CULTURE: NO GROWTH
Culture: NO GROWTH

## 2017-02-12 NOTE — Progress Notes (Signed)
[name] [place], [place] [address] Phone: [phone] Subjective:     CC: Arthritis  MCN:[id number]  Susan [name] is a 83 y.o. female coming in with complaint of arthritis. Patient just moved here recently. Patient has known arthritis changes of multiple joints patient states. Has been seen for left hip arthritis. Has had 3 different intra-articular injections over the course of time last one approximate 6 weeks previously. Patient states that in she has pain all the time. Takes Tylenol regularly. Unable to take anti-inflammatories secondary to history of a TIA and is on a high-dose aspirin. Patient wants to make sure that she is steadily some if she needs any type of intervention. Patient was avoid any type of surgical intervention. Patient continues to try to stay active but needs T8 of a walker. Sometimes mild radiation to the feet with toes. Seems noticing more with sitting. Some increasing fatigue recently. Patient was seen in the hospital recently for more of a cellulitis. Healing well.     Past Medical History:  Diagnosis Date  . Anxiety   . Arthritis    "spine, hips" (01/30/2017)  . Bursitis   . Cellulitis of right foot 01/30/2017  . Fibromyalgia    "qwhere" (01/30/2017)  . GERD (gastroesophageal reflux disease)   . High cholesterol   . History of kidney stones   . History of stomach ulcers   . Hypertension   . Osteoarthritis   . Pneumonia 2000s X 2   "twice" (01/30/2017)  . Stroke [facility]) 10/2014   denies residual on 01/30/2017   Past Surgical History:  Procedure Laterality Date  . APPENDECTOMY    . CATARACT EXTRACTION W/ INTRAOCULAR LENS  IMPLANT, BILATERAL Bilateral   . CHOLECYSTECTOMY OPEN    . TONSILLECTOMY     Social History   Social History  . Marital status: Widowed    Spouse name: N/A  . Number of children: N/A  . Years of education: N/A   Social History Main Topics  . Smoking status: Never Smoker  .  Smokeless tobacco: Never Used  . Alcohol use Yes     Comment: 01/30/2017 "glass of red wine maybe once/month"  . Drug use: No  . Sexual activity: Not Asked   Other Topics Concern  . None   Social History Narrative  . None   Allergies  Allergen Reactions  . Butrans [Buprenorphine] Other (See Comments)    Severe chest pains  . Codeine Other (See Comments)    Severe Chest pains  . Morphine And Related Other (See Comments)    Severe chest pains   Family History  Problem Relation Age of Onset  . Stroke Mother   . Heart attack Mother   . Stroke Father   . Heart attack Father   . Heart attack Maternal Grandmother   . Stroke Maternal Grandmother   . Stroke Paternal Grandmother   . Heart attack Paternal Grandmother   . Heart attack Maternal Grandfather   . Stroke Maternal Grandfather   . Stroke Paternal Grandfather   . Heart attack Paternal Grandfather     Past medical history, social, surgical and family history all reviewed in electronic medical record.  No pertanent information unless stated regarding to the chief complaint.   Review of Systems:Review of systems updated and as accurate as of 02/13/17  No headache, visual changes, nausea, vomiting, diarrhea, constipation, dizziness, abdominal pain, skin rash, fevers, chills, night sweats, weight loss, swollen lymph nodes,  chest  pain, shortness of breath, mood changes. Positive body aches, muscle aches.  Objective  Blood pressure 120/82, pulse 79, height 5\' 1"  (1.549 m), weight 108 lb (49 kg), SpO2 98 %. Systems examined below as of 02/13/17   General: No apparent distress alert and oriented x3 mood and affect normal, dressed appropriately.  HEENT: Pupils equal, extraocular movements intact  Respiratory: Patient's speak in full sentences and does not appear short of breath  Cardiovascular: No lower extremity edema, non tender, no erythema  Skin: Warm dry intact with no signs of infection or rash on extremities or on axial  skeleton. Significant bruising noted multiple areas. Abdomen: Soft nontender  Neuro: Cranial nerves II through XII are intact, neurovascularly intact in all extremities with 2+ DTRs and 2+ pulses.  Lymph: No lymphadenopathy of posterior or anterior cervical chain or axillae bilaterally.  Gait Antalgic gait with the aid of a walker MSK:  Mild tender with mild limitation of range of motion and good stability and symmetric strength and tone of shoulders, elbows, wrist,  knee and ankles bilaterally. Severe arthritic changes of multiple joints. Hip exam shows the patient does have decreased internal range of motion right greater than left more pain on the left side. Neurovascular intact distally. Patient does have muscle atrophy of the legs bilaterally. Decreased range of motion of the back in all planes.    Impression and Recommendations:     This case required medical decision making of moderate complexity.      Note: This dictation was prepared with Dragon dictation along with smaller phrase technology. Any transcriptional errors that result from this process are unintentional.

## 2017-02-13 ENCOUNTER — Ambulatory Visit (INDEPENDENT_AMBULATORY_CARE_PROVIDER_SITE_OTHER): Payer: Medicare Other | Admitting: Family Medicine

## 2017-02-13 ENCOUNTER — Encounter: Payer: Self-pay | Admitting: Family Medicine

## 2017-02-13 DIAGNOSIS — M1610 Unilateral primary osteoarthritis, unspecified hip: Secondary | ICD-10-CM | POA: Diagnosis not present

## 2017-02-13 MED ORDER — VITAMIN D (ERGOCALCIFEROL) 1.25 MG (50000 UNIT) PO CAPS: 50000.0000 [IU] | ORAL_CAPSULE | ORAL | 0 refills | Status: DC

## 2017-02-13 NOTE — Assessment & Plan Note (Signed)
Severe arthritis of the hips bilaterally. Patient has more pain on the left side. Has responded previously to injections in week we'll be able to do this under ultrasound guidance at necessary. Has been 6 weeks since the previous one. Discussed with patient about potential icing, home exercises, once weekly vitamin D given for muscle strength and endurance. We discussed proper shoes that can decrease the amount of pain. Patient also to consider formal physical therapy which patient declined today. Patient come back again in 4 weeks for further evaluation.

## 2017-02-13 NOTE — Patient Instructions (Addendum)
Good to see you  When in pain Ice 20 minutes 2 times daily. Usually after activity and before bed. Stay active.  Tylenol 500mg  3 times daily scheduled Once weekly vitamin D for next 12 weeks. Stop the daily  Over the counter look for Turmeric 500mg  daily but watch for easy bruising and stomach pain  Iron 65mg  with 500mg  of vitamin C 3 times a week  Good shoes with rigid bottom.  Susan [name], [place] or New balance greater then 700 See me again in 4 weeks we can always repeat the hip injections if needed.

## 2017-02-14 DIAGNOSIS — E785 Hyperlipidemia, unspecified: Secondary | ICD-10-CM | POA: Diagnosis not present

## 2017-02-14 DIAGNOSIS — J302 Other seasonal allergic rhinitis: Secondary | ICD-10-CM | POA: Diagnosis not present

## 2017-02-14 DIAGNOSIS — I10 Essential (primary) hypertension: Secondary | ICD-10-CM | POA: Diagnosis not present

## 2017-02-14 DIAGNOSIS — K219 Gastro-esophageal reflux disease without esophagitis: Secondary | ICD-10-CM | POA: Diagnosis not present

## 2017-02-15 ENCOUNTER — Encounter: Payer: Self-pay | Admitting: Internal Medicine

## 2017-03-13 ENCOUNTER — Encounter: Payer: Self-pay | Admitting: Family Medicine

## 2017-03-13 ENCOUNTER — Ambulatory Visit (INDEPENDENT_AMBULATORY_CARE_PROVIDER_SITE_OTHER): Payer: Medicare Other | Admitting: Family Medicine

## 2017-03-13 DIAGNOSIS — M1610 Unilateral primary osteoarthritis, unspecified hip: Secondary | ICD-10-CM

## 2017-03-13 NOTE — Progress Notes (Signed)
Pre-visit discussion using our clinic review tool. No additional management support is needed unless otherwise documented below in the visit note.  

## 2017-03-13 NOTE — Assessment & Plan Note (Signed)
Stable at the moment. No cement changes. Follow-up again in 6 weeks. Can repeat injections as needed. Otherwise we'll not do any more aggressive therapy.

## 2017-03-13 NOTE — Patient Instructions (Addendum)
Good to see you  Heat then when you need it Do not change a thing We will have Baldo Ash get the labs COnsider our other building Which will be [facility] and Dr. Deborra Medina.  See em again in 6-8 weeks or otherwise when you need me.

## 2017-03-13 NOTE — Progress Notes (Signed)
[name] [place], [place] [address] Phone: [phone] Subjective:     CC: Arthritis  NOM:[id number]  Susan [name] is a 83 y.o. female coming in with complaint of arthritis. Patient just moved here recently. Patient has known arthritis changes of multiple joints patient states. Has been seen for left hip arthritis. Has had 3 different intra-articular injections over the course of time last one approximately 10 weeks ago. Patient was to do conservative therapy including vitamin D, iron, Tylenol as well as some mild home exercises. Patient states 50% better. States that she is not having as many aches and pains. Patient states that she is doing relatively well. She is walking better with the walker as well.     Past Medical History:  Diagnosis Date  . Allergic rhinitis   . Anxiety   . Arthritis    "spine, hips" (01/30/2017)  . Bursitis   . Cellulitis of right foot 01/30/2017  . Fibromyalgia    "qwhere" (01/30/2017)  . GERD (gastroesophageal reflux disease)   . High cholesterol   . History of kidney stones   . History of stomach ulcers   . Hypertension   . IBS (irritable bowel syndrome)   . Osteoarthritis   . Pneumonia 2000s X 2   "twice" (01/30/2017)  . Stroke [facility]) 10/2014   denies residual on 01/30/2017   Past Surgical History:  Procedure Laterality Date  . APPENDECTOMY    . CATARACT EXTRACTION W/ INTRAOCULAR LENS  IMPLANT, BILATERAL Bilateral   . CHOLECYSTECTOMY OPEN    . TONSILLECTOMY     Social History   Social History  . Marital status: Widowed    Spouse name: N/A  . Number of children: N/A  . Years of education: N/A   Social History Main Topics  . Smoking status: Never Smoker  . Smokeless tobacco: Never Used  . Alcohol use Yes     Comment: 01/30/2017 "glass of red wine maybe once/month"  . Drug use: No  . Sexual activity: Not Asked   Other Topics Concern  . None   Social History Narrative  . None    Allergies  Allergen Reactions  . Butrans [Buprenorphine] Other (See Comments)    Severe chest pains  . Codeine Other (See Comments)    Severe Chest pains  . Morphine And Related Other (See Comments)    Severe chest pains   Family History  Problem Relation Age of Onset  . Stroke Mother   . Heart attack Mother   . Stroke Father   . Heart attack Father   . Heart attack Maternal Grandmother   . Stroke Maternal Grandmother   . Stroke Paternal Grandmother   . Heart attack Paternal Grandmother   . Heart attack Maternal Grandfather   . Stroke Maternal Grandfather   . Stroke Paternal Grandfather   . Heart attack Paternal Grandfather     Past medical history, social, surgical and family history all reviewed in electronic medical record.  No pertanent information unless stated regarding to the chief complaint.   Review of Systems: No headache, visual changes, nausea, vomiting, diarrhea, constipation, dizziness, abdominal pain, skin rash, fevers, chills, night sweats, weight loss, swollen lymph nodes, chest pain, shortness of breath, mood changes.  Positive joint pain and muscle aches  Objective  Blood pressure (!) 102/56, pulse 71, resp. rate 16, weight 107 lb 8 oz (48.8 kg), SpO2 97 %.   Systems examined below as of 03/13/17 General: NAD A&O x3  mood, affect normal  HEENT: Pupils equal, extraocular movements intact no nystagmus Respiratory: not short of breath at rest or with speaking Cardiovascular: No lower extremity edema, non tender Skin: Warm dry intact with no signs of infection or rash on extremities or on axial skeleton. Abdomen: Soft nontender, no masses Neuro: Cranial nerves  intact, neurovascularly intact in all extremities with 2+ DTRs and 2+ pulses. Lymph: No lymphadenopathy appreciated today  Gait Antalgic gait with the aid of a walker MSK: Minimal tender with mild limitation of range of motion and good stability and symmetric strength and tone of shoulders, elbows,  wrist,  knee and ankles bilaterally. Severe arthritic changes of multiple joints. Hip exam shows the patient does have decreased internal range of motion mild discomfort over the greater trochanteric area bilaterally. Still has significant muscle atrophy of the legs bilaterally..    Impression and Recommendations:     This case required medical decision making of moderate complexity.      Note: This dictation was prepared with Dragon dictation along with smaller phrase technology. Any transcriptional errors that result from this process are unintentional.

## 2017-03-16 ENCOUNTER — Ambulatory Visit (INDEPENDENT_AMBULATORY_CARE_PROVIDER_SITE_OTHER): Payer: Medicare Other | Admitting: Nurse Practitioner

## 2017-03-16 ENCOUNTER — Encounter: Payer: Self-pay | Admitting: Nurse Practitioner

## 2017-03-16 DIAGNOSIS — I639 Cerebral infarction, unspecified: Secondary | ICD-10-CM | POA: Diagnosis not present

## 2017-03-16 DIAGNOSIS — K2100 Gastro-esophageal reflux disease with esophagitis, without bleeding: Secondary | ICD-10-CM

## 2017-03-16 DIAGNOSIS — R1310 Dysphagia, unspecified: Secondary | ICD-10-CM | POA: Diagnosis not present

## 2017-03-16 DIAGNOSIS — K589 Irritable bowel syndrome without diarrhea: Secondary | ICD-10-CM | POA: Insufficient documentation

## 2017-03-16 DIAGNOSIS — K582 Mixed irritable bowel syndrome: Secondary | ICD-10-CM

## 2017-03-16 DIAGNOSIS — M797 Fibromyalgia: Secondary | ICD-10-CM

## 2017-03-16 NOTE — Assessment & Plan Note (Signed)
Last colonoscopy 2014: colon polyp removed (benign per patient). Lomotil and imodium prn.

## 2017-03-16 NOTE — Patient Instructions (Addendum)
If you have medicare related insurance (such as traditional Medicare, [employer], [employer], or similar), Please make an appointment at the scheduling desk with Sharee Pimple, the [employer], for your Wellness visit in this office, which is a benefit with your insurance.  Please sign medical release to get records from previous pcpc and cardiologist.  Please wear knee high compression stocking during the day and off at night.  Maintain upcoming appt with GI.  She provided copy of living will (reviewed and sent to medical record to scan into chart).  Fall Prevention in the Home Falls can cause injuries and can affect people from all age groups. There are many simple things that you can do to make your home safe and to help prevent falls. What can I do on the outside of my home?  Regularly repair the edges of walkways and driveways and fix any cracks.  Remove high doorway thresholds.  Trim any shrubbery on the main path into your home.  Use bright outdoor lighting.  Clear walkways of debris and clutter, including tools and rocks.  Regularly check that handrails are securely fastened and in good repair. Both sides of any steps should have handrails.  Install guardrails along the edges of any raised decks or porches.  Have leaves, snow, and ice cleared regularly.  Use sand or salt on walkways during winter months.  In the garage, clean up any spills right away, including grease or oil spills. What can I do in the bathroom?  Use night lights.  Install grab bars by the toilet and in the tub and shower. Do not use towel bars as grab bars.  Use non-skid mats or decals on the floor of the tub or shower.  If you need to sit down while you are in the shower, use a plastic, non-slip stool.  Keep the floor dry. Immediately clean up any water that spills on the floor.  Remove soap buildup in the tub or shower on a regular basis.  Attach bath mats securely  with double-sided non-slip rug tape.  Remove throw rugs and other tripping hazards from the floor. What can I do in the bedroom?  Use night lights.  Make sure that a bedside light is easy to reach.  Do not use oversized bedding that drapes onto the floor.  Have a firm chair that has side arms to use for getting dressed.  Remove throw rugs and other tripping hazards from the floor. What can I do in the kitchen?  Clean up any spills right away.  Avoid walking on wet floors.  Place frequently used items in easy-to-reach places.  If you need to reach for something above you, use a sturdy step stool that has a grab bar.  Keep electrical cables out of the way.  Do not use floor polish or wax that makes floors slippery. If you have to use wax, make sure that it is non-skid floor wax.  Remove throw rugs and other tripping hazards from the floor. What can I do in the stairways?  Do not leave any items on the stairs.  Make sure that there are handrails on both sides of the stairs. Fix handrails that are broken or loose. Make sure that handrails are as long as the stairways.  Check any carpeting to make sure that it is firmly attached to the stairs. Fix any carpet that is loose or worn.  Avoid having throw rugs at the top or bottom of stairways, or  secure the rugs with carpet tape to prevent them from moving.  Make sure that you have a light switch at the top of the stairs and the bottom of the stairs. If you do not have them, have them installed. What are some other fall prevention tips?  Wear closed-toe shoes that fit well and support your feet. Wear shoes that have rubber soles or low heels.  When you use a stepladder, make sure that it is completely opened and that the sides are firmly locked. Have someone hold the ladder while you are using it. Do not climb a closed stepladder.  Add color or contrast paint or tape to grab bars and handrails in your home. Place contrasting  color strips on the first and last steps.  Use mobility aids as needed, such as canes, walkers, scooters, and crutches.  Turn on lights if it is dark. Replace any light bulbs that burn out.  Set up furniture so that there are clear paths. Keep the furniture in the same spot.  Fix any uneven floor surfaces.  Choose a carpet design that does not hide the edge of steps of a stairway.  Be aware of any and all pets.  Review your medicines with your healthcare provider. Some medicines can cause dizziness or changes in blood pressure, which increase your risk of falling. Talk with your health care provider about other ways that you can decrease your risk of falls. This may include working with a physical therapist or trainer to improve your strength, balance, and endurance. This information is not intended to replace advice given to you by your health care provider. Make sure you discuss any questions you have with your health care provider. Document Released: 11/10/2002 Document Revised: 04/18/2016 Document Reviewed: 12/25/2014 Elsevier Interactive Patient Education  2017 [employer].

## 2017-03-16 NOTE — Progress Notes (Signed)
Subjective:    Patient ID: Susan [name], female    DOB: Feb 07, 1924, 83 y.o.   MRN: [id number]  Patient presents today for establish care (new patient)   HPI  Edema: Bilateral feet Intermittent over several years. Worsen since moved from [place] to [facility] 12/2016. No compression stocking use. Previous furosemide used with some improvement. No pain with ambulation. No fever  She is in office today with daughter Vaughan Basta. HCPOA in daughter deborah Rallis.  She lives in [facility] ([facility] [place] [place] from [place] to [facility] 01/2017.  Needs to be scheduled for AWV.   denies need for medication refill at this time.  Immunizations: (TDAP, Hep C screen, Pneumovax, Influenza, zoster)  Health Maintenance  Topic Date Due  . Tetanus Vaccine  11/26/1945  . DEXA scan (bone density measurement)  11/27/1991  . Pneumonia vaccines (1 of 2 - PCV13) 11/27/1991  . Flu Shot  07/04/2017   Diet:regular Weight:  Wt Readings from Last 3 Encounters:  03/16/17 106 lb (48.1 kg)  03/13/17 107 lb 8 oz (48.8 kg)  02/13/17 108 lb (49 kg)    Exercise:none Fall Risk: Fall Risk  03/16/2017  Falls in the past year? Yes  Number falls in past yr: 2 or more  Injury with Fall? No   Home Safety:Independent living [facility])  Depression/Suicide: Depression screen [facility] 2/9 03/16/2017  Decreased Interest 0  Down, Depressed, Hopeless 0  PHQ - 2 Score 0   Advanced Directive: Advanced Directives 02/01/2017  Does Patient Have a Medical Advance Directive? Yes  Type of Advance Directive [place]  Does patient want to make changes to medical advance directive? -  Copy of [place] in Chart? -  Pre-existing out of facility DNR order (yellow form or pink MOST form) -    Medications and allergies reviewed with patient and updated if appropriate.  Patient Active Problem List   Diagnosis Date Noted  . IBS (irritable bowel syndrome) 03/16/2017  . Dysphagia 03/16/2017  .  GERD with esophagitis 03/16/2017  . Arthritis of hip 02/13/2017  . Cellulitis of right foot 01/30/2017  . Hypokalemia 01/30/2017  . Essential hypertension 01/30/2017  . Elevated troponin 01/30/2017    Current Outpatient Prescriptions on File Prior to Visit  Medication Sig Dispense Refill  . acetaminophen (TYLENOL) 650 MG CR tablet Take 650 mg by mouth every 8 (eight) hours as needed for pain.    Marland Kitchen ALPRAZolam (XANAX) 0.5 MG tablet Take 0.5 mg by mouth at bedtime as needed for anxiety.    Marland Kitchen aspirin EC 325 MG tablet Take 325 mg by mouth daily.    Marland Kitchen atorvastatin (LIPITOR) 10 MG tablet Take 10 mg by mouth daily.    . Biotin (BIOTIN 5000) 5 MG CAPS Take 5 mg by mouth daily.     Marland Kitchen dicyclomine (BENTYL) 10 MG capsule Take 10 mg by mouth 2 (two) times daily.     . diphenoxylate-atropine (LOMOTIL) 2.5-0.025 MG tablet Take 1 tablet by mouth 4 (four) times daily as needed for diarrhea or loose stools.    . Enalapril-Hydrochlorothiazide 5-12.5 MG tablet Take 1 tablet by mouth daily.   3  . famotidine (PEPCID) 20 MG tablet Take 20 mg by mouth 2 (two) times daily.    . fexofenadine (ALLEGRA) 180 MG tablet Take 180 mg by mouth daily.    Marland Kitchen loperamide (IMODIUM) 2 MG capsule Take 2 mg by mouth as needed for diarrhea or loose stools.    . Vitamin D,  Ergocalciferol, (DRISDOL) 50000 units CAPS capsule Take 1 capsule (50,000 Units total) by mouth every 7 (seven) days. 12 capsule 0  . cyclobenzaprine (FLEXERIL) 5 MG tablet Take 5 mg by mouth 3 (three) times daily as needed for spasms.  0   No current facility-administered medications on file prior to visit.     Past Medical History:  Diagnosis Date  . Allergic rhinitis   . Anxiety   . Arthritis    "spine, hips" (01/30/2017)  . Bursitis   . Cellulitis of right foot 01/30/2017  . Fibromyalgia    "qwhere" (01/30/2017)  . GERD (gastroesophageal reflux disease)   . High cholesterol   . History of kidney stones   . History of stomach ulcers   . Hypertension    . IBS (irritable bowel syndrome)   . Osteoarthritis   . Pneumonia 2000s X 2   "twice" (01/30/2017)  . Stroke [facility]) 10/2014   denies residual on 01/30/2017    Past Surgical History:  Procedure Laterality Date  . APPENDECTOMY    . CATARACT EXTRACTION W/ INTRAOCULAR LENS  IMPLANT, BILATERAL Bilateral   . CHOLECYSTECTOMY OPEN    . TONSILLECTOMY      Social History   Social History  . Marital status: Widowed    Spouse name: N/A  . Number of children: N/A  . Years of education: N/A   Social History Main Topics  . Smoking status: Never Smoker  . Smokeless tobacco: Never Used  . Alcohol use Yes     Comment: 01/30/2017 "glass of red wine maybe once/month"  . Drug use: No  . Sexual activity: Not Asked   Other Topics Concern  . None   Social History Narrative  . None    Family History  Problem Relation Age of Onset  . Stroke Mother   . Heart attack Mother   . Stroke Father   . Heart attack Father   . Heart attack Maternal Grandmother   . Stroke Maternal Grandmother   . Stroke Paternal Grandmother   . Heart attack Paternal Grandmother   . Heart attack Maternal Grandfather   . Stroke Maternal Grandfather   . Stroke Paternal Grandfather   . Heart attack Paternal Grandfather         Review of Systems  Constitutional: Negative for fever, malaise/fatigue and weight loss.  HENT: Negative for congestion and sore throat.   Eyes:       Negative for visual changes  Respiratory: Negative for cough and shortness of breath.   Cardiovascular: Positive for leg swelling. Negative for chest pain and palpitations.  Gastrointestinal: Positive for abdominal pain, constipation and diarrhea. Negative for blood in stool, heartburn, melena and nausea.  Genitourinary: Negative for dysuria, frequency and urgency.  Musculoskeletal: Negative for falls, joint pain and myalgias.  Skin: Negative for rash.  Neurological: Negative for dizziness, sensory change and headaches.    Endo/Heme/Allergies: Does not bruise/bleed easily.  Psychiatric/Behavioral: Negative for depression, substance abuse and suicidal ideas. The patient is not nervous/anxious.     Objective:   Vitals:   03/16/17 1408  BP: 122/68  Pulse: 60  Temp: 98.2 F (36.8 C)    Body mass index is 20.03 kg/m.   Physical Examination:  Physical Exam  Constitutional: She is oriented to person, place, and time and well-developed, well-nourished, and in no distress. No distress.  HENT:  Right Ear: External ear normal.  Left Ear: External ear normal.  Nose: Nose normal.  Mouth/Throat: Oropharynx is clear and moist. No  oropharyngeal exudate.  Eyes: Conjunctivae and EOM are normal. Pupils are equal, round, and reactive to light. No scleral icterus.  Neck: Normal range of motion. Neck supple. No thyromegaly present.  Cardiovascular: Normal rate and intact distal pulses.   Murmur heard. Regular heart sounds with ectopic beats  Pulmonary/Chest: Effort normal and breath sounds normal. She exhibits no tenderness.  Abdominal: Soft. Bowel sounds are normal. She exhibits no distension. There is no tenderness.  Musculoskeletal: Normal range of motion. She exhibits edema. She exhibits no tenderness.  Ambulates with walker  Lymphadenopathy:    She has no cervical adenopathy.  Neurological: She is alert and oriented to person, place, and time. Gait normal.  Skin: Skin is warm and dry. There is erythema.  Psychiatric: Affect and judgment normal.  Vitals reviewed.   ASSESSMENT and PLAN:  Zuzanna was seen today for establish care.  Diagnoses and all orders for this visit:  Irritable bowel syndrome with both constipation and diarrhea  Dysphagia, unspecified type  GERD with esophagitis   IBS (irritable bowel syndrome) Last colonoscopy 2014: colon polyp removed (benign per patient). Lomotil and imodium prn.  Dysphagia Last endoscopy 2008: pyloric stenosis      Follow up: Return in about 7  weeks (around 05/04/2017) for HTN, edema (61mins appt) need to be fasting.  Wilfred Lacy, NP

## 2017-03-16 NOTE — Assessment & Plan Note (Signed)
Last endoscopy 2008: pyloric stenosis

## 2017-03-16 NOTE — Progress Notes (Signed)
Pre visit review using our clinic review tool, if applicable. No additional management support is needed unless otherwise documented below in the visit note. 

## 2017-03-19 ENCOUNTER — Encounter: Payer: Self-pay | Admitting: Nurse Practitioner

## 2017-03-19 NOTE — Progress Notes (Signed)
Abstracted result and sent to scan  

## 2017-03-21 ENCOUNTER — Encounter: Payer: Self-pay | Admitting: Nurse Practitioner

## 2017-03-21 DIAGNOSIS — I639 Cerebral infarction, unspecified: Secondary | ICD-10-CM | POA: Insufficient documentation

## 2017-03-21 DIAGNOSIS — Z8673 Personal history of transient ischemic attack (TIA), and cerebral infarction without residual deficits: Secondary | ICD-10-CM | POA: Insufficient documentation

## 2017-03-21 DIAGNOSIS — M797 Fibromyalgia: Secondary | ICD-10-CM | POA: Insufficient documentation

## 2017-03-23 ENCOUNTER — Encounter: Payer: Self-pay | Admitting: Internal Medicine

## 2017-03-23 ENCOUNTER — Telehealth: Payer: Self-pay | Admitting: Nurse Practitioner

## 2017-03-23 ENCOUNTER — Ambulatory Visit (INDEPENDENT_AMBULATORY_CARE_PROVIDER_SITE_OTHER): Payer: Medicare Other | Admitting: Internal Medicine

## 2017-03-23 VITALS — BP 164/70 | HR 60 | Ht 61.0 in | Wt 109.2 lb

## 2017-03-23 DIAGNOSIS — K219 Gastro-esophageal reflux disease without esophagitis: Secondary | ICD-10-CM | POA: Diagnosis not present

## 2017-03-23 DIAGNOSIS — K224 Dyskinesia of esophagus: Secondary | ICD-10-CM | POA: Diagnosis not present

## 2017-03-23 DIAGNOSIS — I639 Cerebral infarction, unspecified: Secondary | ICD-10-CM

## 2017-03-23 DIAGNOSIS — K259 Gastric ulcer, unspecified as acute or chronic, without hemorrhage or perforation: Secondary | ICD-10-CM | POA: Diagnosis not present

## 2017-03-23 DIAGNOSIS — K311 Adult hypertrophic pyloric stenosis: Secondary | ICD-10-CM

## 2017-03-23 DIAGNOSIS — K580 Irritable bowel syndrome with diarrhea: Secondary | ICD-10-CM | POA: Diagnosis not present

## 2017-03-23 MED ORDER — COLESTIPOL HCL 1 G PO TABS: 2.0000 g | ORAL_TABLET | Freq: Two times a day (BID) | ORAL | 6 refills | Status: DC

## 2017-03-23 MED ORDER — FAMOTIDINE 20 MG PO TABS: 20.0000 mg | ORAL_TABLET | Freq: Two times a day (BID) | ORAL | 6 refills | Status: DC

## 2017-03-23 NOTE — Patient Instructions (Signed)
We have sent the following medications to your pharmacy for you to pick up at your convenience: Pepcid, Colestid  Please call back in one month to let us know how you are doing.

## 2017-03-23 NOTE — Telephone Encounter (Signed)
Incoming records from Dr. Kathrene Alu 3 Pages

## 2017-03-27 ENCOUNTER — Encounter: Payer: Self-pay | Admitting: Internal Medicine

## 2017-03-27 NOTE — Progress Notes (Signed)
HISTORY OF PRESENT ILLNESS:  Susan [name] is a 83 y.o. female with past medical history as listed below who is referred by her primary care provider Dr. Jani [name] established GI care. The patient is accompanied by her daughter. The patient reports long-standing GI problems including irritable bowel syndrome (diarrhea predominant), GERD, and dysphagia associated with esophageal dysmotility. Also, a history of pyloric stenosis secondary to peptic ulcer disease for which she is undergone balloon dilation of the pylorus by her report. Her GI care has been principally provided in [place]. We have very limited outside records which have been reviewed. Specifically, a report indicating that she underwent colonoscopy 04/03/2013 and was found to have benign adenomatous colon polyps measuring less than 1 cm. 3 year follow-up recommended despite her age. Also, report that stated she has esophageal dysmotility on an x-ray which explained her swallowing difficulties. No stricture or other lesions. The patient's #1 complaint is that of chronic diarrhea alternating with less frequent constipation. She has used loperamide and Lomotil, particularly when she needs to leave the house. No bleeding. She states that she has been diagnosed with IBS for many years. She is status post remote cholecystectomy 1953. She denies being evaluated for microscopic colitis. She denies any empiric therapies other than antidiarrheals. She states she will have difficulty with her bowels proximate 10 days out of the month. Negative as much as 3 days without trouble. Next, she does have occasional indigestion with bloating. Her chronic dysphagia to liquids and solids is unchanged. She describes coughing or choking spells with liquids. She denies any obstructive symptoms consistent with symptomatic pyloric stenosis such as early satiety or episodes of regurgitation or vomiting. No bleeding. She takes famotidine for GERD. Dicyclomine for abdominal  cramps. Comprehensive metabolic panel from February 2018 shows no significant abnormalities. CBC reveals hemoglobin 10.3 with MCV 94.7. Hemoglobin A1c 5.6. She ambulates with a walker  REVIEW OF SYSTEMS:  Relevant review of systems in history of present illness. All non-GI ROS negative except for allergies, anxiety, arthritis, back pain, fatigue, hearing impairment, muscle pains, ankle swelling  Past Medical History:  Diagnosis Date  . Allergic rhinitis   . Anxiety   . Arthritis    "spine, hips" (01/30/2017)  . Bursitis   . Cellulitis of right foot 01/30/2017  . Colon polyps   . Diverticulosis   . Fibromyalgia    "qwhere" (01/30/2017)  . Gallstones   . GERD (gastroesophageal reflux disease)   . High cholesterol   . History of kidney stones   . History of stomach ulcers   . Hypertension   . IBS (irritable bowel syndrome)   . Osteoarthritis   . Pneumonia 2000s X 2   "twice" (01/30/2017)  . Pyloric stenosis   . Stroke [facility]) 10/2014   denies residual on 01/30/2017    Past Surgical History:  Procedure Laterality Date  . APPENDECTOMY    . CATARACT EXTRACTION W/ INTRAOCULAR LENS  IMPLANT, BILATERAL Bilateral   . CHOLECYSTECTOMY OPEN    . TONSILLECTOMY      Social History Susan [name]  reports that she has never smoked. She has never used smokeless tobacco. She reports that she drinks alcohol. She reports that she does not use drugs.  family history includes Heart attack in her father, maternal grandfather, maternal grandmother, mother, paternal grandfather, and paternal grandmother; Stroke in her father, maternal grandfather, maternal grandmother, mother, paternal grandfather, and paternal grandmother.  Allergies  Allergen Reactions  . Butrans [Buprenorphine] Other (See Comments)    Severe  chest pains  . Codeine Other (See Comments)    Severe Chest pains  . Morphine And Related Other (See Comments)    Severe chest pains       PHYSICAL EXAMINATION: Vital signs: BP (!)  164/70   Pulse 60   Ht 5\' 1"  (1.549 m)   Wt 109 lb 3.2 oz (49.5 kg)   BMI 20.63 kg/m   Constitutional: generally well-appearing, no acute distress Psychiatric: alert and oriented x3, cooperative Eyes: extraocular movements intact, anicteric, conjunctiva pink Mouth: oral pharynx moist, no lesions Neck: suppleWithout thyromegaly Lymph: no lymphadenopathy Cardiovascular: heart regular rate and rhythm, no murmur Lungs: clear to auscultation bilaterally Abdomen: soft, nontender, nondistended, no obvious ascites, no peritoneal signs, normal bowel sounds, no organomegaly. Prior surgical incision well-healed Rectal: Omitted Extremities: no lower extremity edema bilaterally Skin: no lesions on visible extremities Neuro: No focal deficits. Cranial nerves intact  ASSESSMENT:  #1. Diarrhea predominant IBS. Cannot exclude microscopic colitis or bile salt related diarrhea #2. GERD. Minimal classic symptoms on famotidine #3. Chronic dysphagia related to dysmotility #4. History of pyloric stenosis requiring balloon dilation. Currently asymptomatic #5. History of adenomatous colon polyps. Last colonoscopy 2014   PLAN:  #1. Discussed the role of colonoscopy to evaluate for microscopic colitis. She is not interested #2. Continue famotidine #3. Reflux precautions #4. Prescribed Colestid 2 g twice a day. Proper way to take the agent reviewed #5. To make it easy around the patient, I have asked her to Contact the office in one month to report to [place] how she is responding to Colestid. Continue, adjust, or discontinue as indicated. #6. Ongoing general medical care with Dr. Maudie [name] #7. Extensive Outside records requested. To be reviewed  A copy of this consultation note has been sent to Dr. Maudie [name] , Susan [name]

## 2017-04-02 ENCOUNTER — Encounter: Payer: Self-pay | Admitting: Family Medicine

## 2017-04-06 ENCOUNTER — Encounter: Payer: Self-pay | Admitting: Nurse Practitioner

## 2017-04-06 DIAGNOSIS — E785 Hyperlipidemia, unspecified: Secondary | ICD-10-CM | POA: Insufficient documentation

## 2017-04-06 DIAGNOSIS — K311 Adult hypertrophic pyloric stenosis: Secondary | ICD-10-CM | POA: Insufficient documentation

## 2017-04-06 DIAGNOSIS — M3500 Sicca syndrome, unspecified: Secondary | ICD-10-CM | POA: Insufficient documentation

## 2017-04-06 DIAGNOSIS — H3530 Unspecified macular degeneration: Secondary | ICD-10-CM | POA: Insufficient documentation

## 2017-04-13 ENCOUNTER — Encounter: Payer: Self-pay | Admitting: Nurse Practitioner

## 2017-04-16 ENCOUNTER — Encounter: Payer: Self-pay | Admitting: Nurse Practitioner

## 2017-04-16 LAB — BRAIN NATRIURETIC PEPTIDE: Brain Natriuretic Peptide: 42.5

## 2017-04-16 NOTE — Progress Notes (Signed)
Abstracted result and sent to scan  

## 2017-04-23 ENCOUNTER — Encounter: Payer: Self-pay | Admitting: Nurse Practitioner

## 2017-04-23 DIAGNOSIS — F418 Other specified anxiety disorders: Secondary | ICD-10-CM

## 2017-04-23 MED ORDER — ALPRAZOLAM 0.5 MG PO TABS: 0.5000 mg | ORAL_TABLET | Freq: Every evening | ORAL | 0 refills | Status: DC | PRN

## 2017-04-23 NOTE — Progress Notes (Signed)
[name] [address], [place] [address] Phone: [phone] Subjective:     CC: ArthritisHips bilaterally follow-up  GEX:[id number]  Susan [name] is a 83 y.o. female coming in with complaint of arthritis. Patient just moved here recently. Patient has known arthritis changes of multiple joints patient states. Has been seen for left hip arthritis. Has had 3 different intra-articular injections  Patient is having worsening pain on the lateral aspect of the hips. Patient states that his bilateral. Describes pain as a dull, throbbing aching sensation. Has to be using a walker on a more regular basis. Patient states it is waking her up at night and affecting daily activities.      Past Medical History:  Diagnosis Date  . Actinic keratosis, hx of    chest wall (2012)  . Allergic rhinitis   . Anxiety   . Arthritis    "spine, hips" (01/30/2017)  . Bursitis   . Cellulitis of right foot 01/30/2017  . Colon polyps   . Diverticulosis   . Fibromyalgia    "qwhere" (01/30/2017)  . Gallstones   . GERD (gastroesophageal reflux disease)   . High cholesterol   . History of kidney stones   . History of stomach ulcers   . Hx of squamous cell carcinoma excision    right mandible (2011), right arm (2014)  . Hypertension   . IBS (irritable bowel syndrome)   . Osteoarthritis   . Pneumonia 2000s X 2   "twice" (01/30/2017)  . Pyloric stenosis   . Stroke [facility]) 10/2014   denies residual on 01/30/2017   Past Surgical History:  Procedure Laterality Date  . APPENDECTOMY    . CARDIOVASCULAR STRESS TEST  04/2016   nuclear cardiolite stress test done in [place] (no ischemia, no LVH, no LV systolic function)  . CATARACT EXTRACTION W/ INTRAOCULAR LENS  IMPLANT, BILATERAL Bilateral   . CHOLECYSTECTOMY OPEN    . COLONOSCOPY  2009, 2014  . DIAGNOSTIC MAMMOGRAM  2009, 2012  . TONSILLECTOMY     Social History   Social History  . Marital status: Widowed   Spouse name: N/A  . Number of children: N/A  . Years of education: N/A   Social History Main Topics  . Smoking status: Never Smoker  . Smokeless tobacco: Never Used  . Alcohol use Yes     Comment: glass of red wine maybe once week  . Drug use: No  . Sexual activity: Not Asked   Other Topics Concern  . None   Social History Narrative  . None   Allergies  Allergen Reactions  . Butrans [Buprenorphine] Other (See Comments)    Severe chest pains  . Codeine Other (See Comments)    Severe Chest pains  . Morphine And Related Other (See Comments)    Severe chest pains   Family History  Problem Relation Age of Onset  . Stroke Mother   . Heart attack Mother   . Stroke Father   . Heart attack Father   . Heart attack Maternal Grandmother   . Stroke Maternal Grandmother   . Stroke Paternal Grandmother   . Heart attack Paternal Grandmother   . Heart attack Maternal Grandfather   . Stroke Maternal Grandfather   . Stroke Paternal Grandfather   . Heart attack Paternal Grandfather   . Colon cancer Neg Hx   . Pancreatic cancer Neg Hx   . Stomach cancer Neg Hx   . Esophageal cancer Neg Hx  Past medical history, social, surgical and family history all reviewed in electronic medical record.  No pertanent information unless stated regarding to the chief complaint.  Review of Systems: No headache, visual changes, nausea, vomiting, diarrhea, constipation, dizziness, abdominal pain, skin rash, fevers, chills, night sweats, weight loss, swollen lymph nodes, , chest pain, shortness of breath, mood changes.  Positive muscle aches, joint aches, body aches  Objective  Blood pressure 122/74, pulse 67, height 5\' 1"  (1.549 m), weight 111 lb (50.3 kg), SpO2 96 %.   Systems examined below as of 04/24/17 General: NAD A&O x3 mood, affect normal  HEENT: Pupils equal, extraocular movements intact no nystagmus Respiratory: not short of breath at rest or with speaking Cardiovascular: No lower  extremity edema, non tender Skin: Warm dry intact with no signs of infection or rash on extremities or on axial skeleton. Abdomen: Soft nontender, no masses Neuro: Cranial nerves  intact, neurovascularly intact in all extremities with 2+ DTRs and 2+ pulses. Lymph: No lymphadenopathy appreciated today  Gait Antalgic gait with the aid of a walker MSK: Minimal tender with mild limitation of range of motion and good stability and symmetric strength and tone of shoulders, elbows, wrist,  knee and ankles bilaterally. Severe arthritic changes of multiple joints. Hip exam shows the patient does have decreased internal range of motion bilaterally in a patient does have increasing discomfort with Corky Sox test bilaterally. More pain over the lateral aspect of the hips bilaterally. Decreasing range of motion. Neurovascular intact distally.   Procedure: Real-time Ultrasound Guided Injection of right greater trochanteric bursitis secondary to patient's body habitus Device: GE Logiq Q7 Ultrasound guided injection is preferred based studies that show increased duration, increased effect, greater accuracy, decreased procedural pain, increased response rate, and decreased cost with ultrasound guided versus blind injection.  Verbal informed consent obtained.  Time-out conducted.  Noted no overlying erythema, induration, or other signs of local infection.  Skin prepped in a sterile fashion.  Local anesthesia: Topical Ethyl chloride.  With sterile technique and under real time ultrasound guidance:  Greater trochanteric area was visualized and patient's bursa was noted. A 22-gauge 3 inch needle was inserted and 4 cc of 0.5% Marcaine and 1 cc of Kenalog 40 mg/dL was injected. Pictures taken Completed without difficulty  Pain immediately resolved suggesting accurate placement of the medication.  Advised to call if fevers/chills, erythema, induration, drainage, or persistent bleeding.  Images permanently stored and  available for review in the ultrasound unit.  Impression: Technically successful ultrasound guided injection.   Procedure: Real-time Ultrasound Guided Injection of left  greater trochanteric bursitis secondary to patient's body habitus Device: GE Logiq Q7  Ultrasound guided injection is preferred based studies that show increased duration, increased effect, greater accuracy, decreased procedural pain, increased response rate, and decreased cost with ultrasound guided versus blind injection.  Verbal informed consent obtained.  Time-out conducted.  Noted no overlying erythema, induration, or other signs of local infection.  Skin prepped in a sterile fashion.  Local anesthesia: Topical Ethyl chloride.  With sterile technique and under real time ultrasound guidance:  Greater trochanteric area was visualized and patient's bursa was noted. A 22-gauge 3 inch needle was inserted and 4 cc of 0.5% Marcaine and 1 cc of Kenalog 40 mg/dL was injected. Pictures taken Completed without difficulty  Pain immediately resolved suggesting accurate placement of the medication.  Advised to call if fevers/chills, erythema, induration, drainage, or persistent bleeding.  Images permanently stored and available for review in the ultrasound unit.  Impression: Technically successful ultrasound guided injection.    Impression and Recommendations:     This case required medical decision making of moderate complexity.      Note: This dictation was prepared with Dragon dictation along with smaller phrase technology. Any transcriptional errors that result from this process are unintentional.

## 2017-04-24 ENCOUNTER — Encounter: Payer: Self-pay | Admitting: Family Medicine

## 2017-04-24 ENCOUNTER — Ambulatory Visit (INDEPENDENT_AMBULATORY_CARE_PROVIDER_SITE_OTHER): Payer: Medicare Other | Admitting: Family Medicine

## 2017-04-24 ENCOUNTER — Ambulatory Visit: Payer: Self-pay

## 2017-04-24 VITALS — BP 122/74 | HR 67 | Ht 61.0 in | Wt 111.0 lb

## 2017-04-24 DIAGNOSIS — M7062 Trochanteric bursitis, left hip: Secondary | ICD-10-CM | POA: Diagnosis not present

## 2017-04-24 DIAGNOSIS — M25552 Pain in left hip: Secondary | ICD-10-CM

## 2017-04-24 DIAGNOSIS — M7061 Trochanteric bursitis, right hip: Secondary | ICD-10-CM | POA: Insufficient documentation

## 2017-04-24 DIAGNOSIS — I639 Cerebral infarction, unspecified: Secondary | ICD-10-CM | POA: Diagnosis not present

## 2017-04-24 MED ORDER — VITAMIN D (ERGOCALCIFEROL) 1.25 MG (50000 UNIT) PO CAPS: 50000.0000 [IU] | ORAL_CAPSULE | ORAL | 0 refills | Status: DC

## 2017-04-24 NOTE — Patient Instructions (Signed)
Good to see you  We will inject both sides of the house today  Ice is your friend.  Continue to be active.  Can repeat every 3 months See me again in 6-8 weeks if still having pain  .

## 2017-04-24 NOTE — Assessment & Plan Note (Signed)
Bilateral injections given. Patient's did have some good resolution of pain medially. We discussed that there can be some possible lumbar pathology. Patient also has known hip arthritis that could be contribute in. We will discuss at follow-up in 4-6 weeks. Patient has other medications for breakthrough pain.

## 2017-05-16 ENCOUNTER — Encounter: Payer: Self-pay | Admitting: Nurse Practitioner

## 2017-05-16 ENCOUNTER — Ambulatory Visit: Payer: Medicare Other | Admitting: Nurse Practitioner

## 2017-05-16 ENCOUNTER — Other Ambulatory Visit (INDEPENDENT_AMBULATORY_CARE_PROVIDER_SITE_OTHER): Payer: Medicare Other

## 2017-05-16 ENCOUNTER — Ambulatory Visit (INDEPENDENT_AMBULATORY_CARE_PROVIDER_SITE_OTHER): Payer: Medicare Other | Admitting: Nurse Practitioner

## 2017-05-16 VITALS — BP 138/76 | HR 55 | Temp 98.2°F | Ht 61.0 in | Wt 109.0 lb

## 2017-05-16 DIAGNOSIS — E876 Hypokalemia: Secondary | ICD-10-CM | POA: Diagnosis not present

## 2017-05-16 DIAGNOSIS — K582 Mixed irritable bowel syndrome: Secondary | ICD-10-CM | POA: Diagnosis not present

## 2017-05-16 DIAGNOSIS — D649 Anemia, unspecified: Secondary | ICD-10-CM

## 2017-05-16 DIAGNOSIS — R4589 Other symptoms and signs involving emotional state: Secondary | ICD-10-CM

## 2017-05-16 DIAGNOSIS — E782 Mixed hyperlipidemia: Secondary | ICD-10-CM

## 2017-05-16 DIAGNOSIS — I10 Essential (primary) hypertension: Secondary | ICD-10-CM

## 2017-05-16 DIAGNOSIS — Z0000 Encounter for general adult medical examination without abnormal findings: Secondary | ICD-10-CM | POA: Diagnosis not present

## 2017-05-16 DIAGNOSIS — F419 Anxiety disorder, unspecified: Secondary | ICD-10-CM | POA: Insufficient documentation

## 2017-05-16 DIAGNOSIS — Z23 Encounter for immunization: Secondary | ICD-10-CM | POA: Diagnosis not present

## 2017-05-16 DIAGNOSIS — H9193 Unspecified hearing loss, bilateral: Secondary | ICD-10-CM

## 2017-05-16 DIAGNOSIS — F418 Other specified anxiety disorders: Secondary | ICD-10-CM

## 2017-05-16 DIAGNOSIS — H9190 Unspecified hearing loss, unspecified ear: Secondary | ICD-10-CM | POA: Insufficient documentation

## 2017-05-16 LAB — CBC WITH DIFFERENTIAL/PLATELET
BASOS ABS: 0 10*3/uL (ref 0.0–0.1)
Basophils Relative: 0.6 % (ref 0.0–3.0)
EOS PCT: 0.7 % (ref 0.0–5.0)
Eosinophils Absolute: 0 10*3/uL (ref 0.0–0.7)
HEMATOCRIT: 41.8 % (ref 36.0–46.0)
Hemoglobin: 13.7 g/dL (ref 12.0–15.0)
LYMPHS PCT: 19 % (ref 12.0–46.0)
Lymphs Abs: 1.3 10*3/uL (ref 0.7–4.0)
MCHC: 32.8 g/dL (ref 30.0–36.0)
MCV: 93 fl (ref 78.0–100.0)
MONOS PCT: 9.2 % (ref 3.0–12.0)
Monocytes Absolute: 0.7 10*3/uL (ref 0.1–1.0)
NEUTROS ABS: 5 10*3/uL (ref 1.4–7.7)
Neutrophils Relative %: 70.5 % (ref 43.0–77.0)
Platelets: 275 10*3/uL (ref 150.0–400.0)
RBC: 4.49 Mil/uL (ref 3.87–5.11)
RDW: 13.5 % (ref 11.5–15.5)
WBC: 7.1 10*3/uL (ref 4.0–10.5)

## 2017-05-16 LAB — BASIC METABOLIC PANEL
BUN: 13 mg/dL (ref 6–23)
CHLORIDE: 99 meq/L (ref 96–112)
CO2: 31 mEq/L (ref 19–32)
CREATININE: 0.67 mg/dL (ref 0.40–1.20)
Calcium: 10.1 mg/dL (ref 8.4–10.5)
GFR: 87.79 mL/min (ref >60.00)
Glucose, Bld: 95 mg/dL (ref 70–99)
Potassium: 3.9 mEq/L (ref 3.5–5.1)
Sodium: 139 mEq/L (ref 135–145)

## 2017-05-16 LAB — LIPID PANEL
Cholesterol: 235 mg/dL — ABNORMAL HIGH (ref 0–200)
HDL: 100.7 mg/dL (ref >39.00)
LDL CALC: 111 mg/dL — AB (ref 0–99)
NONHDL: 134.41
Total CHOL/HDL Ratio: 2
Triglycerides: 118 mg/dL (ref 0.0–149.0)
VLDL: 23.6 mg/dL (ref 0.0–40.0)

## 2017-05-16 LAB — IRON AND TIBC
%SAT: 25 % (ref 11–50)
IRON: 98 ug/dL (ref 45–160)
TIBC: 397 ug/dL (ref 250–450)
UIBC: 299 ug/dL

## 2017-05-16 LAB — FERRITIN: Ferritin: 51.4 ng/mL (ref 10.0–291.0)

## 2017-05-16 MED ORDER — DIPHENOXYLATE-ATROPINE 2.5-0.025 MG/5ML PO LIQD: 5.0000 mL | Freq: Three times a day (TID) | ORAL | 0 refills | Status: DC | PRN

## 2017-05-16 MED ORDER — ALPRAZOLAM 0.5 MG PO TABS: 0.5000 mg | ORAL_TABLET | Freq: Every evening | ORAL | 1 refills | Status: DC | PRN

## 2017-05-16 NOTE — Progress Notes (Signed)
Pre visit review using our clinic review tool, if applicable. No additional management support is needed unless otherwise documented below in the visit note. 

## 2017-05-16 NOTE — Assessment & Plan Note (Signed)
Has appt with [facility].

## 2017-05-16 NOTE — Progress Notes (Signed)
Subjective:  Patient ID: Susan [name], female    DOB: [birth year]  Age: 83 y.o. MRN: [id number]  CC: Follow-up (follow up and lab-better for diarrhea and constipation-see Susan [name] at 13--tdap?) and Medicare Wellness  Medical screening examination/treatment/procedure(s) were performed by he Nino Parsley, Therapist, sports. As primary care provider I was immediately available for consulation/collaboration. I agree with above documentation. Wilfred Lacy, AGNP-C  HPI   IBS-mixed: Managed with lomotil or imodium or bentyl. No blood in stool, no melena, no mucus, no change in appetite, no weight loss. No nausea, no vomiting. Recent flare due to consumption of Hot dogs 2days ago.  Edema: Resolved since last OV.  Hyperlipidemia: Taking Lipitor as prescribed.  HTN: Stable with enalapril HCTZ.  Outpatient Medications Prior to Visit  Medication Sig Dispense Refill  . acetaminophen (TYLENOL) 650 MG CR tablet Take 650 mg by mouth every 8 (eight) hours as needed for pain.    . Ascorbic Acid (VITAMIN C) 250 MG CHEW Chew 250 mg by mouth 3 (three) times a week. 2 tab 3 time a week    . aspirin EC 325 MG tablet Take 325 mg by mouth daily.    Marland Kitchen atorvastatin (LIPITOR) 10 MG tablet Take 10 mg by mouth daily.    Marland Kitchen dicyclomine (BENTYL) 10 MG capsule Take 10 mg by mouth 2 (two) times daily.     . Enalapril-Hydrochlorothiazide 5-12.5 MG tablet Take 1 tablet by mouth daily.   3  . famotidine (PEPCID) 20 MG tablet Take 1 tablet (20 mg total) by mouth 2 (two) times daily. 60 tablet 6  . fexofenadine (ALLEGRA) 180 MG tablet Take 180 mg by mouth daily.    . IRON PO Take 65 mg by mouth 3 (three) times a week.    . Vitamin D, Ergocalciferol, (DRISDOL) 50000 units CAPS capsule Take 1 capsule (50,000 Units total) by mouth every 7 (seven) days. 12 capsule 0  . ALPRAZolam (XANAX) 0.5 MG tablet Take 1 tablet (0.5 mg total) by mouth at bedtime as needed for anxiety. 30 tablet 0  . diphenoxylate-atropine (LOMOTIL) 2.5-0.025 MG  tablet Take 1 tablet by mouth 4 (four) times daily as needed for diarrhea or loose stools.    Marland Kitchen loperamide (IMODIUM) 2 MG capsule Take 2 mg by mouth as needed for diarrhea or loose stools.    . Biotin (BIOTIN 5000) 5 MG CAPS Take 5 mg by mouth daily.      No facility-administered medications prior to visit.     ROS See HPI  Objective:  BP 138/76   Pulse (!) 55   Temp 98.2 F (36.8 C)   Ht 5\' 1"  (1.549 m)   Wt 109 lb (49.4 kg)   SpO2 97%   BMI 20.60 kg/m   BP Readings from Last 3 Encounters:  05/16/17 138/76  04/24/17 122/74  03/23/17 (!) 164/70    Wt Readings from Last 3 Encounters:  05/16/17 109 lb (49.4 kg)  04/24/17 111 lb (50.3 kg)  03/23/17 109 lb 3.2 oz (49.5 kg)    Physical Exam  Constitutional: She is oriented to person, place, and time.  HENT:  Mouth/Throat: No oropharyngeal exudate.  Neck: Normal range of motion. Neck supple.  Cardiovascular: Normal rate and regular rhythm.   Murmur heard. Pulmonary/Chest: Effort normal and breath sounds normal.  Abdominal: Soft. She exhibits distension. There is no tenderness. There is no rebound and no guarding.  Musculoskeletal: She exhibits no edema or tenderness.  Neurological: She is alert and oriented to person, place,  and time.  Vitals reviewed.   Lab Results  Component Value Date   WBC 7.1 05/16/2017   HGB 13.7 05/16/2017   HCT 41.8 05/16/2017   PLT 275.0 05/16/2017   GLUCOSE 95 05/16/2017   CHOL 235 (H) 05/16/2017   TRIG 118.0 05/16/2017   HDL 100.70 05/16/2017   LDLCALC 111 (H) 05/16/2017   ALT 15 12/23/2016   AST 22 12/23/2016   NA 139 05/16/2017   K 3.9 05/16/2017   CL 99 05/16/2017   CREATININE 0.67 05/16/2017   BUN 13 05/16/2017   CO2 31 05/16/2017   TSH 1.11 02/15/2016   HGBA1C 5.6 01/31/2017    No results found.  Assessment & Plan:   Susan [name] was seen today for follow-up and medicare wellness.  Diagnoses and all orders for this visit:  Irritable bowel syndrome with both constipation  and diarrhea -     diphenoxylate-atropine (LOMOTIL) 2.5-0.025 MG/5ML liquid; Take 5 mLs by mouth 3 (three) times daily as needed for diarrhea or loose stools.  Anxiety about health -     ALPRAZolam (XANAX) 0.5 MG tablet; Take 1 tablet (0.5 mg total) by mouth at bedtime as needed for anxiety.  Essential hypertension -     Basic metabolic panel; Future  Mixed hyperlipidemia -     Lipid panel; Future  Hypokalemia -     Basic metabolic panel; Future  Anemia, unspecified type -     CBC w/Diff; Future -     Iron and TIBC; Future -     Ferritin; Future  Need for pneumococcal vaccination -     Pneumococcal conjugate vaccine 13-valent  Encounter for Medicare annual wellness exam  Bilateral hearing loss, unspecified hearing loss type   I have discontinued Susan [name]'s diphenoxylate-atropine and loperamide. I am also having her start on diphenoxylate-atropine. Additionally, I am having her maintain her atorvastatin, fexofenadine, aspirin EC, Biotin, acetaminophen, dicyclomine, Enalapril-Hydrochlorothiazide, IRON PO, Vitamin C, famotidine, Vitamin D (Ergocalciferol), and ALPRAZolam.  Meds ordered this encounter  Medications  . ALPRAZolam (XANAX) 0.5 MG tablet    Sig: Take 1 tablet (0.5 mg total) by mouth at bedtime as needed for anxiety.    Dispense:  30 tablet    Refill:  1    Do not fill before 05/24/2017    Order Specific Question:   Supervising Provider    Answer:   Cassandria Anger [[id number]]  . diphenoxylate-atropine (LOMOTIL) 2.5-0.025 MG/5ML liquid    Sig: Take 5 mLs by mouth 3 (three) times daily as needed for diarrhea or loose stools.    Dispense:  120 mL    Refill:  0    Order Specific Question:   Supervising Provider    Answer:   Cassandria Anger [[id number]]    Follow-up: Return in about 6 months (around 11/15/2017) for hyperlipidemia, HTN.  Wilfred Lacy, NP   Medical screening examination/treatment/procedure(s) were performed by he Nino Parsley, RN. As primary  care provider I was immediately available for consulation/collaboration. I agree with above documentation. Wilfred Lacy, AGNP-C

## 2017-05-16 NOTE — Progress Notes (Signed)
Subjective:   Susan [name] is a 83 y.o. female who presents for an Initial Medicare Annual Wellness Visit.  Review of Systems    No ROS.  Medicare Wellness Visit. Additional risk factors are reflected in the social history.  Cardiac Risk Factors include: advanced age (>71men, >23 women);dyslipidemia;hypertension Sleep patterns: feels rested on waking, gets up 1-2 times nightly to void and sleeps 6-7 hours nightly.    Home Safety/Smoke Alarms: Feels safe in home. Smoke alarms in place.  Living environment; residence and Adult nurse: assisted living, equipment: Walkers, Type: [employer] fast streamer, Type: Tub Surveyor, quantity, no firearms. Lives at [facility], good family support Seat Belt Safety/Bike Helmet: Wears seat belt.   Counseling:   Eye Exam- appointment yearly Dental- appointment every 6 months     Objective:    Today's Vitals   05/16/17 0959 05/16/17 1057  BP: 138/76   Pulse: (!) 55   Temp: 98.2 F (36.8 C)   SpO2: 97%   Weight: 109 lb (49.4 kg)   Height: 5\' 1"  (1.549 m)   PainSc:  2    Body mass index is 20.6 kg/m.   Current Medications (verified) Outpatient Encounter Prescriptions as of 05/16/2017  Medication Sig  . acetaminophen (TYLENOL) 650 MG CR tablet Take 650 mg by mouth every 8 (eight) hours as needed for pain.  Marland Kitchen ALPRAZolam (XANAX) 0.5 MG tablet Take 1 tablet (0.5 mg total) by mouth at bedtime as needed for anxiety.  . Ascorbic Acid (VITAMIN C) 250 MG CHEW Chew 250 mg by mouth 3 (three) times a week. 2 tab 3 time a week  . aspirin EC 325 MG tablet Take 325 mg by mouth daily.  Marland Kitchen atorvastatin (LIPITOR) 10 MG tablet Take 10 mg by mouth daily.  Marland Kitchen dicyclomine (BENTYL) 10 MG capsule Take 10 mg by mouth 2 (two) times daily.   . Enalapril-Hydrochlorothiazide 5-12.5 MG tablet Take 1 tablet by mouth daily.   . famotidine (PEPCID) 20 MG tablet Take 1 tablet (20 mg total) by mouth 2 (two) times daily.  . fexofenadine (ALLEGRA) 180 MG tablet Take  180 mg by mouth daily.  . IRON PO Take 65 mg by mouth 3 (three) times a week.  . Vitamin D, Ergocalciferol, (DRISDOL) 50000 units CAPS capsule Take 1 capsule (50,000 Units total) by mouth every 7 (seven) days.  . [DISCONTINUED] ALPRAZolam (XANAX) 0.5 MG tablet Take 1 tablet (0.5 mg total) by mouth at bedtime as needed for anxiety.  . [DISCONTINUED] diphenoxylate-atropine (LOMOTIL) 2.5-0.025 MG tablet Take 1 tablet by mouth 4 (four) times daily as needed for diarrhea or loose stools.  . [DISCONTINUED] loperamide (IMODIUM) 2 MG capsule Take 2 mg by mouth as needed for diarrhea or loose stools.  . Biotin (BIOTIN 5000) 5 MG CAPS Take 5 mg by mouth daily.   . diphenoxylate-atropine (LOMOTIL) 2.5-0.025 MG/5ML liquid Take 5 mLs by mouth 3 (three) times daily as needed for diarrhea or loose stools.   No facility-administered encounter medications on file as of 05/16/2017.     Allergies (verified) Butrans [buprenorphine]; Codeine; and Morphine and related   History: Past Medical History:  Diagnosis Date  . Actinic keratosis, hx of    chest wall (2012)  . Allergic rhinitis   . Anxiety   . Arthritis    "spine, hips" (01/30/2017)  . Bursitis   . Cellulitis of right foot 01/30/2017  . Colon polyps   . Diverticulosis   . Fibromyalgia    "qwhere" (01/30/2017)  .  Gallstones   . GERD (gastroesophageal reflux disease)   . High cholesterol   . History of kidney stones   . History of stomach ulcers   . Hx of squamous cell carcinoma excision    right mandible (2011), right arm (2014)  . Hypertension   . IBS (irritable bowel syndrome)   . Osteoarthritis   . Pneumonia 2000s X 2   "twice" (01/30/2017)  . Pyloric stenosis   . Stroke [facility]) 10/2014   denies residual on 01/30/2017   Past Surgical History:  Procedure Laterality Date  . APPENDECTOMY    . CARDIOVASCULAR STRESS TEST  04/2016   nuclear cardiolite stress test done in [place] (no ischemia, no LVH, no LV systolic function)  . CATARACT  EXTRACTION W/ INTRAOCULAR LENS  IMPLANT, BILATERAL Bilateral   . CHOLECYSTECTOMY OPEN    . COLONOSCOPY  2009, 2014  . DIAGNOSTIC MAMMOGRAM  2009, 2012  . TONSILLECTOMY     Family History  Problem Relation Age of Onset  . Stroke Mother   . Heart attack Mother   . Stroke Father   . Heart attack Father   . Heart attack Maternal Grandmother   . Stroke Maternal Grandmother   . Stroke Paternal Grandmother   . Heart attack Paternal Grandmother   . Heart attack Maternal Grandfather   . Stroke Maternal Grandfather   . Stroke Paternal Grandfather   . Heart attack Paternal Grandfather   . Colon cancer Neg Hx   . Pancreatic cancer Neg Hx   . Stomach cancer Neg Hx   . Esophageal cancer Neg Hx    Social History   Occupational History  . Not on file.   Social History Main Topics  . Smoking status: Never Smoker  . Smokeless tobacco: Never Used  . Alcohol use Yes     Comment: glass of red Mykael Trott maybe once week  . Drug use: No  . Sexual activity: Not on file    Tobacco Counseling Counseling given: Not Answered   Activities of Daily Living In your present state of health, do you have any difficulty performing the following activities: 05/16/2017 01/30/2017  Hearing? Tempie Donning  Vision? N N  Difficulty concentrating or making decisions? N Y  Walking or climbing stairs? Y Y  Dressing or bathing? N N  Doing errands, shopping? Tempie Donning  Preparing Food and eating ? Y -  Using the Toilet? N -  In the past six months, have you accidently leaked urine? N -  Do you have problems with loss of bowel control? Y -  Managing your Medications? N -  Managing your Finances? N -  Housekeeping or managing your Housekeeping? Y -  Some recent data might be hidden    Immunizations and Health Maintenance Immunization History  Administered Date(s) Administered  . Pneumococcal Conjugate-13 05/16/2017   Health Maintenance Due  Topic Date Due  . PNA vac Low Risk Adult (2 of 2 - PPSV23) 12/04/2013     Patient Care Team: Nche, Charlene Brooke, NP as PCP - General (Internal Medicine)  Indicate any recent Medical Services you may have received from other than [facility] providers in the past year (date may be approximate).     Assessment:   This is a routine wellness examination for Yennifer.Physical assessment deferred to PCP.   Hearing/Vision screen Hearing Screening Comments: HOH, recently went to audiologist  Dietary issues and exercise activities discussed: Current Exercise Habits: Home exercise routine, Type of exercise: stretching;Other - see comments (Exercises from physical  therapy ), Time (Minutes): 20, Frequency (Times/Week): 5, Weekly Exercise (Minutes/Week): 100, Intensity: Mild, Exercise limited by: orthopedic condition(s)  Diet (meal preparation, eat out, water intake, caffeinated beverages, dairy products, fruits and vegetables): in general, a "healthy" diet  , well balanced, low salt drinks 2-3 glasses of water per day, states she does not have much of an appetite and supplements with ensure.   Discussed small frequent meals, snacks, encouraged patient to increase daily water intake.     Goals    . To be as active and as independent as possible      Depression Screen PHQ 2/9 Scores 05/16/2017 03/16/2017  PHQ - 2 Score 0 0    Fall Risk Fall Risk  05/16/2017 03/16/2017  Falls in the past year? Yes Yes  Number falls in past yr: 2 or more 2 or more  Injury with Fall? - No  Risk for fall due to : Impaired balance/gait;Impaired mobility -  Follow up Falls prevention discussed -    Cognitive Function: MMSE - Mini Mental State Exam 05/16/2017  Orientation to time 5  Orientation to Place 5  Registration 3  Attention/ Calculation 5  Recall 2  Language- name 2 objects 2  Language- repeat 1  Language- follow 3 step command 3  Language- read & follow direction 1  Write a sentence 1  Copy design 1  Total score 29        Screening Tests Health Maintenance  Topic Date  Due  . PNA vac Low Risk Adult (2 of 2 - PPSV23) 12/04/2013  . TETANUS/TDAP  05/06/2018 (Originally 11/26/1943)  . INFLUENZA VACCINE  07/04/2017  . DEXA SCAN  Completed      Plan:  Continue doing brain stimulating activities (puzzles, reading, adult coloring books, staying active) to keep memory sharp.   Continue to eat heart healthy diet (full of fruits, vegetables, whole grains, lean protein, water--limit salt, fat, and sugar intake) and increase physical activity as tolerated.  I have personally reviewed and noted the following in the patient's chart:   . Medical and social history . Use of alcohol, tobacco or illicit drugs  . Current medications and supplements . Functional ability and status . Nutritional status . Physical activity . Advanced directives . List of other physicians . Vitals . Screenings to include cognitive, depression, and falls . Referrals and appointments  In addition, I have reviewed and discussed with patient certain preventive protocols, quality metrics, and best practice recommendations. A written personalized care plan for preventive services as well as general preventive health recommendations were provided to patient.     Michiel Cowboy, RN   05/16/2017

## 2017-05-16 NOTE — Patient Instructions (Signed)
Please go to basement for blood draw.  Use only lomotil for diarrhea  Diet for Irritable Bowel Syndrome When you have irritable bowel syndrome (IBS), the foods you eat and your eating habits are very important. IBS may cause various symptoms, such as abdominal pain, constipation, or diarrhea. Choosing the right foods can help ease discomfort caused by these symptoms. Work with your health care provider and dietitian to find the best eating plan to help control your symptoms. What general guidelines do I need to follow?  Keep a food diary. This will help you identify foods that cause symptoms. Write down: ? What you eat and when. ? What symptoms you have. ? When symptoms occur in relation to your meals.  Avoid foods that cause symptoms. Talk with your dietitian about other ways to get the same nutrients that are in these foods.  Eat more foods that contain fiber. Take a fiber supplement if directed by your dietitian.  Eat your meals slowly, in a relaxed setting.  Aim to eat 5-6 small meals per day. Do not skip meals.  Drink enough fluids to keep your urine clear or pale yellow.  Ask your health care provider if you should take an over-the-counter probiotic during flare-ups to help restore healthy gut bacteria.  If you have cramping or diarrhea, try making your meals low in fat and high in carbohydrates. Examples of carbohydrates are pasta, rice, whole grain breads and cereals, fruits, and vegetables.  If dairy products cause your symptoms to flare up, try eating less of them. You might be able to handle yogurt better than other dairy products because it contains bacteria that help with digestion. What foods are not recommended? The following are some foods and drinks that may worsen your symptoms:  Fatty foods, such as [place] fries.  Milk products, such as cheese or ice cream.  Chocolate.  Alcohol.  Products with caffeine, such as coffee.  Carbonated drinks, such as  soda.  The items listed above may not be a complete list of foods and beverages to avoid. Contact your dietitian for more information. What foods are good sources of fiber? Your health care provider or dietitian may recommend that you eat more foods that contain fiber. Fiber can help reduce constipation and other IBS symptoms. Add foods with fiber to your diet a little at a time so that your body can get used to them. Too much fiber at once might cause gas and swelling of your abdomen. The following are some foods that are good sources of fiber:  Apples.  Peaches.  Pears.  Berries.  Figs.  Broccoli (raw).  Cabbage.  Carrots.  Raw peas.  Kidney beans.  Lima beans.  Whole grain bread.  Whole grain cereal.  Where to find more information: [employer] for Functional Gastrointestinal Disorders: www.iffgd.[employer] of Diabetes and Digestive and Kidney Diseases: NetworkAffair.co.za.aspx This information is not intended to replace advice given to you by your health care provider. Make sure you discuss any questions you have with your health care provider. Document Released: 02/10/2004 Document Revised: 04/27/2016 Document Reviewed: 02/20/2014 Elsevier Interactive Patient Education  2018 [employer].    Dysphagia Dysphagia is trouble swallowing. This condition occurs when solids and liquids stick in a person's throat on the way down to the stomach, or when food takes longer to get to the stomach. You may have problems swallowing food, liquids, or both. You may also have pain while trying to swallow. It may take you more time  and effort to swallow something. What are the causes? This condition is caused by:  Problems with the muscles. They may make it difficult for you to move food and liquids through the tube that connects your mouth to your stomach (esophagus). You may have ulcers, scar  tissue, or inflammation that blocks the normal passage of food and liquids. Causes of these problems include: ? Acid reflux from your stomach into your esophagus (gastroesophageal reflux). ? Infections. ? Radiation treatment for cancer. ? Medicines taken without enough fluids to wash them down into your stomach.  Nerve problems. These prevent signals from being sent to the muscles of your esophagus to squeeze (contract) and move what you swallow down to your stomach.  Globus pharyngeus. This is a common problem that involves feeling like something is stuck in the throat or a sense of trouble with swallowing even though nothing is wrong with the swallowing passages.  Stroke. This can affect the nerves and make it difficult to swallow.  Certain conditions, such as cerebral palsy or Parkinson disease.  What are the signs or symptoms? Common symptoms of this condition include:  A feeling that solids or liquids are stuck in your throat on the way down to the stomach.  Food taking too long to get to the stomach.  Other symptoms include:  Food moving back from your stomach to your mouth (regurgitation).  Noises coming from your throat.  Chest discomfort with swallowing.  A feeling of fullness when swallowing.  Drooling, especially when the throat is blocked.  Pain while swallowing.  Heartburn.  Coughing or gagging while trying to swallow.  How is this diagnosed? This condition is diagnosed by:  Barium X-ray. In this test, you swallow a white substance (contrast medium)that sticks to the inside of your esophagus. X-ray images are then taken.  Endoscopy. In this test, a flexible telescope is inserted down your throat to look at your esophagus and your stomach.  CT scans and MRI.  How is this treated? Treatment for dysphagia depends on the cause of the condition:  If the dysphagia is caused by acid reflux or infection, medicines may be used. They may include antibiotics and  heartburn medicines.  If the dysphagia is caused by problems with your muscles, swallowing therapy may be used to help you strengthen your swallowing muscles. You may have to do specific exercises to strengthen the muscles or stretch them.  If the dysphagia is caused by a blockage or mass, procedures to remove the blockage may be done. You may need surgery and a feeding tube.  You may need to make diet changes. Ask your health care provider for specific instructions. Follow these instructions at home: Eating and drinking  Try to eat soft food that is easier to swallow.  Follow any diet changes as told by your health care provider.  Cut your food into small pieces and eat slowly.  Eat and drink only when you are sitting upright.  Do not drink alcohol or caffeine. If you need help quitting, ask your health care provider. General instructions  Check your weight every day to make sure you are not losing weight.  Take over-the-counter and prescription medicines only as told by your health care provider.  If you were prescribed an antibiotic medicine, take it as told by your health care provider. Do not stop taking the antibiotic even if you start to feel better.  Do not use any products that contain nicotine or tobacco, such as cigarettes and e-cigarettes.  If you need help quitting, ask your health care provider.  Keep all follow-up visits as told by your health care provider. This is important. Contact a health care provider if:  You lose weight because you cannot swallow.  You cough when you drink liquids (aspiration).  You cough up partially digested food. Get help right away if:  You cannot swallow your saliva.  You have shortness of breath or a fever, or both.  You have a hoarse voice and also have trouble swallowing. Summary  Dysphagia is trouble swallowing. This condition occurs when solids and liquids stick in a person's throat on the way down to the stomach, or when  food takes longer to get to the stomach.  Dysphagia has many possible causes and symptoms.  Treatment for dysphagia depends on the cause of the condition. This information is not intended to replace advice given to you by your health care provider. Make sure you discuss any questions you have with your health care provider. Document Released: 11/17/2000 Document Revised: 11/09/2016 Document Reviewed: 11/09/2016 Elsevier Interactive Patient Education  2017 [employer].

## 2017-05-17 ENCOUNTER — Ambulatory Visit: Payer: Medicare Other | Admitting: Nurse Practitioner

## 2017-05-29 ENCOUNTER — Encounter: Payer: Self-pay | Admitting: Nurse Practitioner

## 2017-05-29 DIAGNOSIS — H9193 Unspecified hearing loss, bilateral: Secondary | ICD-10-CM

## 2017-05-31 ENCOUNTER — Encounter: Payer: Self-pay | Admitting: Nurse Practitioner

## 2017-06-08 DIAGNOSIS — H01001 Unspecified blepharitis right upper eyelid: Secondary | ICD-10-CM | POA: Diagnosis not present

## 2017-06-08 DIAGNOSIS — H52203 Unspecified astigmatism, bilateral: Secondary | ICD-10-CM | POA: Diagnosis not present

## 2017-06-08 DIAGNOSIS — H43813 Vitreous degeneration, bilateral: Secondary | ICD-10-CM | POA: Diagnosis not present

## 2017-06-08 DIAGNOSIS — H04123 Dry eye syndrome of bilateral lacrimal glands: Secondary | ICD-10-CM | POA: Diagnosis not present

## 2017-06-18 ENCOUNTER — Encounter: Payer: Self-pay | Admitting: Family Medicine

## 2017-06-19 ENCOUNTER — Ambulatory Visit: Payer: Self-pay

## 2017-06-19 ENCOUNTER — Encounter: Payer: Self-pay | Admitting: Family Medicine

## 2017-06-19 ENCOUNTER — Ambulatory Visit (INDEPENDENT_AMBULATORY_CARE_PROVIDER_SITE_OTHER): Payer: Medicare Other | Admitting: Family Medicine

## 2017-06-19 VITALS — BP 122/70 | HR 95 | Ht 61.0 in | Wt 111.0 lb

## 2017-06-19 DIAGNOSIS — M25552 Pain in left hip: Secondary | ICD-10-CM

## 2017-06-19 DIAGNOSIS — M7062 Trochanteric bursitis, left hip: Secondary | ICD-10-CM | POA: Diagnosis not present

## 2017-06-19 DIAGNOSIS — I639 Cerebral infarction, unspecified: Secondary | ICD-10-CM | POA: Diagnosis not present

## 2017-06-19 DIAGNOSIS — M7061 Trochanteric bursitis, right hip: Secondary | ICD-10-CM | POA: Diagnosis not present

## 2017-06-19 NOTE — Progress Notes (Signed)
[name] [address],  [address] Phone: [phone] Subjective:    I'm seeing this patient by the request  of:    CC: left hip pain   [id number]  Susan [name] is a 83 y.o. female coming in with complaint of eft hip pain. Patient has been seen previously and had greater than 10 bursitis. Has been many months since patient has had an injection. Recently did have a fall. Fell onto her left hip. Had significant bruising. Difficulty walking for 2 days and now seems to be improving again.Patient states that unfortunately the pain on the lateral aspect of the hip is worsening. Waking her up at night. Some dull, throbbing aching discomfort. Patient states that it is affecting some her daily activities such as walking long distances.     Past Medical History:  Diagnosis Date  . Actinic keratosis, hx of    chest wall (2012)  . Allergic rhinitis   . Anxiety   . Arthritis    "spine, hips" (01/30/2017)  . Bursitis   . Cellulitis of right foot 01/30/2017  . Colon polyps   . Diverticulosis   . Fibromyalgia    "qwhere" (01/30/2017)  . Gallstones   . GERD (gastroesophageal reflux disease)   . High cholesterol   . History of kidney stones   . History of stomach ulcers   . Hx of squamous cell carcinoma excision    right mandible (2011), right arm (2014)  . Hypertension   . IBS (irritable bowel syndrome)   . Osteoarthritis   . Pneumonia 2000s X 2   "twice" (01/30/2017)  . Pyloric stenosis   . Stroke [facility]) 10/2014   denies residual on 01/30/2017   Past Surgical History:  Procedure Laterality Date  . APPENDECTOMY    . CARDIOVASCULAR STRESS TEST  04/2016   nuclear cardiolite stress test done in [place] (no ischemia, no LVH, no LV systolic function)  . CATARACT EXTRACTION W/ INTRAOCULAR LENS  IMPLANT, BILATERAL Bilateral   . CHOLECYSTECTOMY OPEN    . COLONOSCOPY  2009, 2014  . DIAGNOSTIC MAMMOGRAM  2009, 2012  . TONSILLECTOMY      Social History   Social History  . Marital status: Widowed    Spouse name: N/A  . Number of children: N/A  . Years of education: N/A   Social History Main Topics  . Smoking status: Never Smoker  . Smokeless tobacco: Never Used  . Alcohol use Yes     Comment: glass of red wine maybe once week  . Drug use: No  . Sexual activity: Not on file   Other Topics Concern  . Not on file   Social History Narrative  . No narrative on file   Allergies  Allergen Reactions  . Butrans [Buprenorphine] Other (See Comments)    Severe chest pains  . Codeine Other (See Comments)    Severe Chest pains  . Morphine And Related Other (See Comments)    Severe chest pains   Family History  Problem Relation Age of Onset  . Stroke Mother   . Heart attack Mother   . Stroke Father   . Heart attack Father   . Heart attack Maternal Grandmother   . Stroke Maternal Grandmother   . Stroke Paternal Grandmother   . Heart attack Paternal Grandmother   . Heart attack Maternal Grandfather   . Stroke Maternal Grandfather   . Stroke Paternal Grandfather   . Heart attack Paternal Grandfather   .  Colon cancer Neg Hx   . Pancreatic cancer Neg Hx   . Stomach cancer Neg Hx   . Esophageal cancer Neg Hx     Past medical history, social, surgical and family history all reviewed in electronic medical record.  No pertanent information unless stated regarding to the chief complaint.   Review of Systems:Review of systems updated and as accurate as of 06/19/17  No headache, visual changes, nausea, vomiting, diarrhea, constipation, dizziness, abdominal pain, skin rash, fevers, chills, night sweats, weight loss, swollen lymph nodes, body aches, joint swelling, muscle aches, chest pain, shortness of breath, mood changes.   Objective  Height 5\' 1"  (1.549 m), weight 111 lb (50.3 kg). Systems examined below as of 06/19/17   General: No apparent distress alert and oriented x3 mood and affect normal, dressed  appropriately.  HEENT: Pupils equal, extraocular movements intact  Respiratory: Patient's speak in full sentences and does not appear short of breath  Cardiovascular: No lower extremity edema, non tender, no erythema  Skin: Warm dry intact with no signs of infection or rash on extremities or on axial skeleton.  Abdomen: Soft nontender  Neuro: Cranial nerves II through XII are intact, neurovascularly intact in all extremities with 2+ DTRs and 2+ pulses.  Lymph: No lymphadenopathy of posterior or anterior cervical chain or axillae bilaterally.  Gait antalgic gait walking with the aid of a walker.  MSK:  tender with full range of motion and good stability and symmetric strength and tone of shoulders, elbows, wrist,  knee and ankles bilaterally. arthritic changes of multiple joints Hip exam bilaterally shows significant pain on the side of the left hip. Patient pain GT area.    Procedure: Real-time Ultrasound Guided Injection of left  greater trochanteric bursitis secondary to patient's body habitus Device: GE Logiq Q7  Ultrasound guided injection is preferred based studies that show increased duration, increased effect, greater accuracy, decreased procedural pain, increased response rate, and decreased cost with ultrasound guided versus blind injection.  Verbal informed consent obtained.  Time-out conducted.  Noted no overlying erythema, induration, or other signs of local infection.  Skin prepped in a sterile fashion.  Local anesthesia: Topical Ethyl chloride.  With sterile technique and under real time ultrasound guidance:  Greater trochanteric area was visualized and patient's bursa was noted. A 22-gauge 3 inch needle was inserted and 4 cc of 0.5% Marcaine and 1 cc of Kenalog 40 mg/dL was injected. Pictures taken  Completed without difficulty  Pain immediately resolved suggesting accurate placement of the medication.  Advised to call if fevers/chills, erythema, induration, drainage, or  persistent bleeding.  Images permanently stored and available for review in the ultrasound unit.  Impression: Technically successful ultrasound guided injection.   Impression and Recommendations:     This case required medical decision making of moderate complexity.      Note: This dictation was prepared with Dragon dictation along with smaller phrase technology. Any transcriptional errors that result from this process are unintentional.

## 2017-06-19 NOTE — Assessment & Plan Note (Signed)
Did have fall. contusion noted.  Will do well after injection, discuss arnica No sign of fracture Continue conservative therapy  See me again in 4 weeks.

## 2017-06-19 NOTE — Progress Notes (Signed)
Pre visit review using our clinic review tool, if applicable. No additional management support is needed unless otherwise documented below in the visit note. 

## 2017-06-19 NOTE — Patient Instructions (Signed)
Good to see yo u Ice 20 minutes 2 times daily. Usually after activity and before bed. pennsaid pinkie amount topically 2 times daily as needed.  Arnica lotion can help a lot for brusing 2 times a day  See me again in 4 weeks if you need me

## 2017-06-25 DIAGNOSIS — H903 Sensorineural hearing loss, bilateral: Secondary | ICD-10-CM | POA: Diagnosis not present

## 2017-07-17 ENCOUNTER — Encounter: Payer: Self-pay | Admitting: Nurse Practitioner

## 2017-07-17 ENCOUNTER — Telehealth: Payer: Self-pay | Admitting: Nurse Practitioner

## 2017-07-17 DIAGNOSIS — F418 Other specified anxiety disorders: Secondary | ICD-10-CM

## 2017-07-17 MED ORDER — ENALAPRIL-HYDROCHLOROTHIAZIDE 5-12.5 MG PO TABS: 1.0000 | ORAL_TABLET | Freq: Every day | ORAL | 1 refills | Status: DC

## 2017-07-17 MED ORDER — ALPRAZOLAM 0.5 MG PO TABS: 0.5000 mg | ORAL_TABLET | Freq: Every evening | ORAL | 1 refills | Status: DC | PRN

## 2017-07-17 NOTE — Telephone Encounter (Signed)
rx sent to pharmacy as requested

## 2017-07-18 ENCOUNTER — Ambulatory Visit (INDEPENDENT_AMBULATORY_CARE_PROVIDER_SITE_OTHER): Payer: Medicare Other | Admitting: Family Medicine

## 2017-07-18 ENCOUNTER — Ambulatory Visit (INDEPENDENT_AMBULATORY_CARE_PROVIDER_SITE_OTHER)
Admission: RE | Admit: 2017-07-18 | Discharge: 2017-07-18 | Disposition: A | Discharge status: Home / Self Care | Payer: Medicare Other | Source: Ambulatory Visit | Attending: Family Medicine | Admitting: Family Medicine

## 2017-07-18 ENCOUNTER — Other Ambulatory Visit: Payer: Self-pay

## 2017-07-18 ENCOUNTER — Encounter: Payer: Self-pay | Admitting: Family Medicine

## 2017-07-18 VITALS — BP 138/72 | HR 78 | Ht 61.0 in | Wt 111.0 lb

## 2017-07-18 DIAGNOSIS — M5416 Radiculopathy, lumbar region: Secondary | ICD-10-CM

## 2017-07-18 DIAGNOSIS — M7062 Trochanteric bursitis, left hip: Secondary | ICD-10-CM

## 2017-07-18 DIAGNOSIS — I639 Cerebral infarction, unspecified: Secondary | ICD-10-CM

## 2017-07-18 DIAGNOSIS — M545 Low back pain: Secondary | ICD-10-CM | POA: Diagnosis not present

## 2017-07-18 DIAGNOSIS — M7061 Trochanteric bursitis, right hip: Secondary | ICD-10-CM | POA: Diagnosis not present

## 2017-07-18 MED ORDER — GABAPENTIN 100 MG PO CAPS: 100.0000 mg | ORAL_CAPSULE | Freq: Every day | ORAL | 3 refills | Status: DC

## 2017-07-18 NOTE — Assessment & Plan Note (Signed)
Patient did not make any significant improvement. Does have a history of fibromyalgia. It is concerned with patients different medical problems I do not want to increase any type of treatment or medications due to much. Will start a very low dose gabapentin 100 mg. Warned of potential side effects. We discussed continuing icing regimen. X-rays the hip to rule out occult fracture from the fall as well as the lower back for any lumbar radiculopathy. Do think that there is some degenerative changes and encourage patient to continue the vitamin D and iron supplementation. Follow-up again in 2 weeks

## 2017-07-18 NOTE — Progress Notes (Signed)
[name] [address], [place] [address] Phone: [phone] Subjective:     CC: left hip pain f/u  [id number]  Susan [name] is a 83 y.o. female coming in with complaint of eft hip pain. Found to have more of a greater trochanter bursitis. Was seen one month ago and was given an injection for the greater trochanter bursa. Because patient did have an acute injury patient is following up again. Patient states it is better little bit. Not completely gone. Still having severe amount of pain and she tries to increase activity. Seems down and radiate towards her knee. Patient is concerned with this. Patient was continued to be active and tries to take care of first..    Past Medical History:  Diagnosis Date  . Actinic keratosis, hx of    chest wall (2012)  . Allergic rhinitis   . Anxiety   . Arthritis    "spine, hips" (01/30/2017)  . Bursitis   . Cellulitis of right foot 01/30/2017  . Colon polyps   . Diverticulosis   . Fibromyalgia    "qwhere" (01/30/2017)  . Gallstones   . GERD (gastroesophageal reflux disease)   . High cholesterol   . History of kidney stones   . History of stomach ulcers   . Hx of squamous cell carcinoma excision    right mandible (2011), right arm (2014)  . Hypertension   . IBS (irritable bowel syndrome)   . Osteoarthritis   . Pneumonia 2000s X 2   "twice" (01/30/2017)  . Pyloric stenosis   . Stroke [facility]) 10/2014   denies residual on 01/30/2017   Past Surgical History:  Procedure Laterality Date  . APPENDECTOMY    . CARDIOVASCULAR STRESS TEST  04/2016   nuclear cardiolite stress test done in [place] (no ischemia, no LVH, no LV systolic function)  . CATARACT EXTRACTION W/ INTRAOCULAR LENS  IMPLANT, BILATERAL Bilateral   . CHOLECYSTECTOMY OPEN    . COLONOSCOPY  2009, 2014  . DIAGNOSTIC MAMMOGRAM  2009, 2012  . TONSILLECTOMY     Social History   Social History  . Marital status: Widowed    Spouse name:  N/A  . Number of children: N/A  . Years of education: N/A   Social History Main Topics  . Smoking status: Never Smoker  . Smokeless tobacco: Never Used  . Alcohol use Yes     Comment: glass of red wine maybe once week  . Drug use: No  . Sexual activity: Not Asked   Other Topics Concern  . None   Social History Narrative  . None   Allergies  Allergen Reactions  . Butrans [Buprenorphine] Other (See Comments)    Severe chest pains  . Codeine Other (See Comments)    Severe Chest pains  . Morphine And Related Other (See Comments)    Severe chest pains   Family History  Problem Relation Age of Onset  . Stroke Mother   . Heart attack Mother   . Stroke Father   . Heart attack Father   . Heart attack Maternal Grandmother   . Stroke Maternal Grandmother   . Stroke Paternal Grandmother   . Heart attack Paternal Grandmother   . Heart attack Maternal Grandfather   . Stroke Maternal Grandfather   . Stroke Paternal Grandfather   . Heart attack Paternal Grandfather   . Colon cancer Neg Hx   . Pancreatic cancer Neg Hx   . Stomach cancer Neg Hx   .  Esophageal cancer Neg Hx     Past medical history, social, surgical and family history all reviewed in electronic medical record.  No pertanent information unless stated regarding to the chief complaint.   Review of Systems: No headache, visual changes, nausea, vomiting, diarrhea, constipation, dizziness, abdominal pain, skin rash, fevers, chills, night sweats, weight loss, swollen lymph nodes, body aches, joint swelling, chest pain, shortness of breath, mood changes.    Objective  Blood pressure 138/72, pulse 78, height 5\' 1"  (1.549 m), weight 111 lb (50.3 kg), SpO2 96 %.   Systems examined below as of 07/18/17 General: NAD A&O x3 mood, affect normal  HEENT: Pupils equal, extraocular movements intact no nystagmus Respiratory: not short of breath at rest or with speaking Cardiovascular: No lower extremity edema, non tender Skin:  Significant dryness of the skin and does have senile spots Abdomen: Soft nontender, no masses Neuro: Cranial nerves  intact, neurovascularly intact in all extremities with 2+ DTRs and 2+ pulses. Lymph: No lymphadenopathy appreciated today  Gait Antalgic gait with a walker  MSK: Non tender with full range of motion and good stability and symmetric strength and tone of shoulders, elbows, wrist,  knee and ankles bilaterally.  Severe arthritic changes of multiple joints atrophy of the lower Jimmy's bilaterally  Left hip exam shows diffuse tenderness over the lateral aspect, buttocks, as well as in the groin area. Patient does have some limited range of motion lacking the last 10 of internal rotation in the last 10 of external rotation. No crepitus felt. Negative straight leg test. Moderate tenderness to palpation in the paraspinal musculature. Contralateral hip has severe and limited range of motion with internal rotation      Impression and Recommendations:     This case required medical decision making of moderate complexity.      Note: This dictation was prepared with Dragon dictation along with smaller phrase technology. Any transcriptional errors that result from this process are unintentional.

## 2017-07-18 NOTE — Patient Instructions (Addendum)
Good to see you  Xray today downstairs Gabapentin 100mg  at night  Continue the vitamin D once weekly Consider tart cherry extract any dose at night as well  See me again in 2ish weeks to make sure we are doing better

## 2017-07-23 DIAGNOSIS — D485 Neoplasm of uncertain behavior of skin: Secondary | ICD-10-CM | POA: Diagnosis not present

## 2017-07-23 DIAGNOSIS — D1801 Hemangioma of skin and subcutaneous tissue: Secondary | ICD-10-CM | POA: Diagnosis not present

## 2017-07-23 DIAGNOSIS — Z85828 Personal history of other malignant neoplasm of skin: Secondary | ICD-10-CM | POA: Diagnosis not present

## 2017-07-23 DIAGNOSIS — L820 Inflamed seborrheic keratosis: Secondary | ICD-10-CM | POA: Diagnosis not present

## 2017-07-23 DIAGNOSIS — C44329 Squamous cell carcinoma of skin of other parts of face: Secondary | ICD-10-CM | POA: Diagnosis not present

## 2017-07-23 DIAGNOSIS — L821 Other seborrheic keratosis: Secondary | ICD-10-CM | POA: Diagnosis not present

## 2017-07-23 DIAGNOSIS — L814 Other melanin hyperpigmentation: Secondary | ICD-10-CM | POA: Diagnosis not present

## 2017-07-23 DIAGNOSIS — L570 Actinic keratosis: Secondary | ICD-10-CM | POA: Diagnosis not present

## 2017-08-01 ENCOUNTER — Ambulatory Visit (INDEPENDENT_AMBULATORY_CARE_PROVIDER_SITE_OTHER): Payer: Medicare Other | Admitting: Family Medicine

## 2017-08-01 ENCOUNTER — Encounter: Payer: Self-pay | Admitting: Family Medicine

## 2017-08-01 DIAGNOSIS — M7062 Trochanteric bursitis, left hip: Secondary | ICD-10-CM

## 2017-08-01 DIAGNOSIS — I639 Cerebral infarction, unspecified: Secondary | ICD-10-CM

## 2017-08-01 DIAGNOSIS — M7061 Trochanteric bursitis, right hip: Secondary | ICD-10-CM

## 2017-08-01 NOTE — Patient Instructions (Signed)
Good to see you  I am glad the gabapentin is helping Continue everything else you are doing Use the pennsaid when you need it up to 2 times a day  See me again in 2-3 months

## 2017-08-01 NOTE — Progress Notes (Signed)
[facility] [name], [place] [address] Phone: [phone] Subjective:     CC: left hip pain f/u  [id number]  Susan [name] is a 83 y.o. female coming in with complaint of eft hip pain. Found to have more of a greater trochanter bursitis. Was seen one month ago and was given an injection for the greater trochanter bursa. Was having pain that seems to be more radicular. Sent to have x-rays of the back. This was inability visualized by me showing severe degenerative disc disease of the lumbar spine with osteopenia. Patient states that the gabapentin that she was prescribed has helped out significantly. Patient states that she is 50% better.  Past Medical History:  Diagnosis Date  . Actinic keratosis, hx of    chest wall (2012)  . Allergic rhinitis   . Anxiety   . Arthritis    "spine, hips" (01/30/2017)  . Bursitis   . Cellulitis of right foot 01/30/2017  . Colon polyps   . Diverticulosis   . Fibromyalgia    "qwhere" (01/30/2017)  . Gallstones   . GERD (gastroesophageal reflux disease)   . High cholesterol   . History of kidney stones   . History of stomach ulcers   . Hx of squamous cell carcinoma excision    right mandible (2011), right arm (2014)  . Hypertension   . IBS (irritable bowel syndrome)   . Osteoarthritis   . Pneumonia 2000s X 2   "twice" (01/30/2017)  . Pyloric stenosis   . Stroke [facility]) 10/2014   denies residual on 01/30/2017   Past Surgical History:  Procedure Laterality Date  . APPENDECTOMY    . CARDIOVASCULAR STRESS TEST  04/2016   nuclear cardiolite stress test done in [place] (no ischemia, no LVH, no LV systolic function)  . CATARACT EXTRACTION W/ INTRAOCULAR LENS  IMPLANT, BILATERAL Bilateral   . CHOLECYSTECTOMY OPEN    . COLONOSCOPY  2009, 2014  . DIAGNOSTIC MAMMOGRAM  2009, 2012  . TONSILLECTOMY     Social History   Social History  . Marital status: Widowed    Spouse name: N/A  . Number of children:  N/A  . Years of education: N/A   Social History Main Topics  . Smoking status: Never Smoker  . Smokeless tobacco: Never Used  . Alcohol use Yes     Comment: glass of red wine maybe once week  . Drug use: No  . Sexual activity: Not Asked   Other Topics Concern  . None   Social History Narrative  . None   Allergies  Allergen Reactions  . Butrans [Buprenorphine] Other (See Comments)    Severe chest pains  . Codeine Other (See Comments)    Severe Chest pains  . Morphine And Related Other (See Comments)    Severe chest pains   Family History  Problem Relation Age of Onset  . Stroke Mother   . Heart attack Mother   . Stroke Father   . Heart attack Father   . Heart attack Maternal Grandmother   . Stroke Maternal Grandmother   . Stroke Paternal Grandmother   . Heart attack Paternal Grandmother   . Heart attack Maternal Grandfather   . Stroke Maternal Grandfather   . Stroke Paternal Grandfather   . Heart attack Paternal Grandfather   . Colon cancer Neg Hx   . Pancreatic cancer Neg Hx   . Stomach cancer Neg Hx   . Esophageal cancer Neg Hx  Past medical history, social, surgical and family history all reviewed in electronic medical record.  No pertanent information unless stated regarding to the chief complaint.   Review of Systems: No headache, visual changes, nausea, vomiting, diarrhea, constipation, dizziness, abdominal pain, skin rash, fevers, chills, night sweats, weight loss, swollen lymph nodes, body aches, joint swelling, muscle aches, chest pain, shortness of breath, mood changes.   Objective  Blood pressure 122/80, pulse 63, height 5\' 1"  (1.549 m), weight 114 lb (51.7 kg), SpO2 97 %.   Systems examined below as of 08/01/17 General: NAD A&O x3 mood, affect normal  HEENT: Pupils equal, extraocular movements intact no nystagmus Respiratory: not short of breath at rest or with speaking Cardiovascular: No lower extremity edema, non tender Skin: Warm dry intact  with no signs of infection or rash on extremities or on axial skeleton. Abdomen: Soft nontender, no masses Neuro: Cranial nerves  intact, neurovascularly intact in all extremities with 2+ DTRs and 2+ pulses. Lymph: No lymphadenopathy appreciated today    Gait Antalgic gait with a walker  MSK: Non tender with full range of motion and good stability and symmetric strength and tone of shoulders, elbows, wrist,  knee and ankles bilaterally.  Severe arthritic changes of multiple joints  Patient's hip exam shows significant decrease in internal range of motion bilaterally. Patient still has soreness that is out of proportion to the amount of palpation to the lateral aspect of the hips. Patient feels that it is improved.     Impression and Recommendations:     This case required medical decision making of moderate complexity.      Note: This dictation was prepared with Dragon dictation along with smaller phrase technology. Any transcriptional errors that result from this process are unintentional.

## 2017-08-01 NOTE — Assessment & Plan Note (Signed)
Patient seems to be doing relatively well at this time. No significant changes in management. Continue the gabapentin at night as well as the topical anti-inflammatories and follow-up again in 2-3 months.

## 2017-08-02 ENCOUNTER — Encounter: Payer: Self-pay | Admitting: Nurse Practitioner

## 2017-08-02 MED ORDER — ATORVASTATIN CALCIUM 10 MG PO TABS: 10.0000 mg | ORAL_TABLET | Freq: Every day | ORAL | 1 refills | Status: DC

## 2017-08-14 ENCOUNTER — Other Ambulatory Visit: Payer: Self-pay | Admitting: Family Medicine

## 2017-08-14 DIAGNOSIS — D0439 Carcinoma in situ of skin of other parts of face: Secondary | ICD-10-CM | POA: Diagnosis not present

## 2017-08-14 MED ORDER — VITAMIN D (ERGOCALCIFEROL) 1.25 MG (50000 UNIT) PO CAPS: 50000.0000 [IU] | ORAL_CAPSULE | ORAL | 0 refills | Status: DC

## 2017-08-24 ENCOUNTER — Encounter: Payer: Self-pay | Admitting: Family Medicine

## 2017-08-24 DIAGNOSIS — H01004 Unspecified blepharitis left upper eyelid: Secondary | ICD-10-CM | POA: Diagnosis not present

## 2017-08-24 DIAGNOSIS — H01005 Unspecified blepharitis left lower eyelid: Secondary | ICD-10-CM | POA: Diagnosis not present

## 2017-08-24 DIAGNOSIS — H01001 Unspecified blepharitis right upper eyelid: Secondary | ICD-10-CM | POA: Diagnosis not present

## 2017-08-24 DIAGNOSIS — H04123 Dry eye syndrome of bilateral lacrimal glands: Secondary | ICD-10-CM | POA: Diagnosis not present

## 2017-09-03 ENCOUNTER — Encounter: Payer: Self-pay | Admitting: Nurse Practitioner

## 2017-09-03 NOTE — Telephone Encounter (Signed)
Pt send a massage report taking 2 times daily but our direction once a day. Will inform pt that rx sent to [facility] on 07/17/2017 for 6 mo supply.

## 2017-09-06 ENCOUNTER — Encounter: Payer: Self-pay | Admitting: Nurse Practitioner

## 2017-09-07 ENCOUNTER — Encounter: Payer: Self-pay | Admitting: Nurse Practitioner

## 2017-09-07 ENCOUNTER — Ambulatory Visit (INDEPENDENT_AMBULATORY_CARE_PROVIDER_SITE_OTHER): Payer: Medicare Other | Admitting: Nurse Practitioner

## 2017-09-07 VITALS — BP 160/78 | HR 84 | Temp 98.1°F | Ht 61.0 in | Wt 116.0 lb

## 2017-09-07 DIAGNOSIS — F418 Other specified anxiety disorders: Secondary | ICD-10-CM | POA: Diagnosis not present

## 2017-09-07 DIAGNOSIS — I10 Essential (primary) hypertension: Secondary | ICD-10-CM

## 2017-09-07 DIAGNOSIS — Z23 Encounter for immunization: Secondary | ICD-10-CM | POA: Diagnosis not present

## 2017-09-07 DIAGNOSIS — K582 Mixed irritable bowel syndrome: Secondary | ICD-10-CM

## 2017-09-07 MED ORDER — HYDROCHLOROTHIAZIDE 12.5 MG PO TABS: 12.5000 mg | ORAL_TABLET | Freq: Every day | ORAL | 1 refills | Status: DC

## 2017-09-07 MED ORDER — DIPHENOXYLATE-ATROPINE 2.5-0.025 MG PO TABS: 1.0000 | ORAL_TABLET | Freq: Three times a day (TID) | ORAL | 1 refills | Status: DC | PRN

## 2017-09-07 MED ORDER — ENALAPRIL MALEATE 5 MG PO TABS: 5.0000 mg | ORAL_TABLET | Freq: Two times a day (BID) | ORAL | 1 refills | Status: DC

## 2017-09-07 MED ORDER — ALPRAZOLAM 0.5 MG PO TABS: 0.5000 mg | ORAL_TABLET | Freq: Every evening | ORAL | 2 refills | Status: DC | PRN

## 2017-09-07 MED ORDER — METOPROLOL SUCCINATE ER 25 MG PO TB24: 25.0000 mg | ORAL_TABLET | Freq: Every day | ORAL | 1 refills | Status: DC

## 2017-09-07 NOTE — Assessment & Plan Note (Signed)
Continue use of xanax 0.5mg  hs prn

## 2017-09-07 NOTE — Patient Instructions (Signed)
Please call medical records about paper copy of advance directive: [phone].  Continue to hold gabapentin.  Stop enalapril-HCTZ. Start Enalapril 5mg  twice a day, HCTZ 12.5mg  once a day, and metoprolol 25mg  once a day.  Continue to check BP 3 times a week and record. Bring BP records to next office visit.

## 2017-09-07 NOTE — Progress Notes (Signed)
Subjective:  Patient ID: Susan [name], female    DOB: [birth year]  Age: 83 y.o. MRN: [id number]  CC: Medication Refill (medication follow up--lomotil consult: req tablet? enalapil consult. flu shot?) and Foot Swelling (feet swelling--comes and goes--high BP up and down)   HPI HTN: Home BP reading: 140s-180s. HR: 60-80 Restless with elevated BP x 1week. She increased enalapril dose to 5-12.5 BID in last 2weeks No change in diet. Gabapentin was started 11month ago to treat hip pain. She stopped medication thinking that me be cause of acute increase in BP. Has not noticed improvement in BP with holding gabapenitn in last 1week.  Anxiety: Stable with use of xanax prn.  IBS: Need lomotil changed from liquid to tablet.  Outpatient Medications Prior to Visit  Medication Sig Dispense Refill  . acetaminophen (TYLENOL) 650 MG CR tablet Take 650 mg by mouth every 8 (eight) hours as needed for pain.    . Ascorbic Acid (VITAMIN C) 250 MG CHEW Chew 250 mg by mouth 3 (three) times a week. 2 tab 3 time a week    . aspirin EC 325 MG tablet Take 325 mg by mouth daily.    Marland Kitchen atorvastatin (LIPITOR) 10 MG tablet Take 1 tablet (10 mg total) by mouth daily. 90 tablet 1  . dicyclomine (BENTYL) 10 MG capsule Take 10 mg by mouth 2 (two) times daily.     . famotidine (PEPCID) 20 MG tablet Take 1 tablet (20 mg total) by mouth 2 (two) times daily. 60 tablet 6  . fexofenadine (ALLEGRA) 180 MG tablet Take 180 mg by mouth daily.    . IRON PO Take 65 mg by mouth 3 (three) times a week.    . Vitamin D, Ergocalciferol, (DRISDOL) 50000 units CAPS capsule Take 1 capsule (50,000 Units total) by mouth every 7 (seven) days. 12 capsule 0  . ALPRAZolam (XANAX) 0.5 MG tablet Take 1 tablet (0.5 mg total) by mouth at bedtime as needed for anxiety. 30 tablet 1  . diphenoxylate-atropine (LOMOTIL) 2.5-0.025 MG/5ML liquid Take 5 mLs by mouth 3 (three) times daily as needed for diarrhea or loose stools. 120 mL 0  .  Enalapril-Hydrochlorothiazide 5-12.5 MG tablet Take 1 tablet by mouth daily. (Patient taking differently: Take 2 tablets by mouth daily. ) 90 tablet 1  . gabapentin (NEURONTIN) 100 MG capsule Take 1 capsule (100 mg total) by mouth at bedtime. (Patient not taking: Reported on 09/07/2017) 30 capsule 3   No facility-administered medications prior to visit.     ROS Review of Systems  Constitutional: Negative for malaise/fatigue.  Eyes: Negative for blurred vision.  Respiratory: Negative for cough, sputum production and shortness of breath.   Cardiovascular: Positive for leg swelling. Negative for chest pain and palpitations.       Chronic edema: waxing and waning  Gastrointestinal: Negative for nausea.  Musculoskeletal: Negative for falls.  Skin: Negative.   Neurological: Negative for dizziness, weakness and headaches.  Psychiatric/Behavioral: Negative for depression. The patient does not have insomnia.     Objective:  BP (!) 160/78   Pulse 84   Temp 98.1 F (36.7 C)   Ht 5\' 1"  (1.549 m)   Wt 116 lb (52.6 kg)   SpO2 97%   BMI 21.92 kg/m   BP Readings from Last 3 Encounters:  09/07/17 (!) 160/78  08/01/17 122/80  07/18/17 138/72    Wt Readings from Last 3 Encounters:  09/07/17 116 lb (52.6 kg)  08/01/17 114 lb (51.7 kg)  07/18/17 111 lb (  50.3 kg)    Physical Exam  Constitutional: She is oriented to person, place, and time. No distress.  Cardiovascular: Normal rate and regular rhythm.   Murmur heard. Pulmonary/Chest: Effort normal and breath sounds normal.  Musculoskeletal: She exhibits edema. She exhibits no tenderness.  Neurological: She is alert and oriented to person, place, and time.  Skin: No erythema.  Psychiatric: She has a normal mood and affect. Her behavior is normal.  Vitals reviewed.   Lab Results  Component Value Date   WBC 7.1 05/16/2017   HGB 13.7 05/16/2017   HCT 41.8 05/16/2017   PLT 275.0 05/16/2017   GLUCOSE 95 05/16/2017   CHOL 235 (H)  05/16/2017   TRIG 118.0 05/16/2017   HDL 100.70 05/16/2017   LDLCALC 111 (H) 05/16/2017   ALT 15 12/23/2016   AST 22 12/23/2016   NA 139 05/16/2017   K 3.9 05/16/2017   CL 99 05/16/2017   CREATININE 0.67 05/16/2017   BUN 13 05/16/2017   CO2 31 05/16/2017   TSH 1.11 02/15/2016   HGBA1C 5.6 01/31/2017    Dg Lumbar Spine Complete  Result Date: 07/18/2017 CLINICAL DATA:  Status post fall 1 month ago.  Low back pain EXAM: LUMBAR SPINE - COMPLETE 4+ VIEW COMPARISON:  None. FINDINGS: There are 5 nonrib bearing lumbar-type vertebral bodies. The vertebral body heights are maintained. There generalized osteopenia. 5 mm anterolisthesis of L4 on L5 secondary to facet disease. There is no spondylolysis. There is no acute fracture. Degenerative disc disease with disc height loss at L2-3, L3-4, L4-5 L5-S1. Bilateral facet arthropathy throughout the lumbar spine most severe at L4-5 and L5-S1. The SI joints are unremarkable. There is abdominal aortic atherosclerosis. IMPRESSION: 1.  No acute osseous injury of the lumbar spine. 2. Lumbar spine spondylosis as described above. Electronically Signed   By: Susan [name]   On: 07/18/2017 14:08    Assessment & Plan:   Susan [name] was seen today for medication refill and foot swelling.  Diagnoses and all orders for this visit:  Essential hypertension -     hydrochlorothiazide (HYDRODIURIL) 12.5 MG tablet; Take 1 tablet (12.5 mg total) by mouth daily. -     enalapril (VASOTEC) 5 MG tablet; Take 1 tablet (5 mg total) by mouth 2 (two) times daily. -     metoprolol succinate (TOPROL-XL) 25 MG 24 hr tablet; Take 1 tablet (25 mg total) by mouth daily.  Anxiety about health -     ALPRAZolam (XANAX) 0.5 MG tablet; Take 1 tablet (0.5 mg total) by mouth at bedtime as needed for anxiety.  Irritable bowel syndrome with both constipation and diarrhea -     diphenoxylate-atropine (LOMOTIL) 2.5-0.025 MG tablet; Take 1 tablet by mouth 3 (three) times daily as needed for  diarrhea or loose stools.  Need for influenza vaccination -     Flu vaccine HIGH DOSE PF   I have discontinued Susan [name]'s diphenoxylate-atropine, Enalapril-Hydrochlorothiazide, and gabapentin. I am also having her start on hydrochlorothiazide, enalapril, metoprolol succinate, and diphenoxylate-atropine. Additionally, I am having her maintain her fexofenadine, aspirin EC, acetaminophen, dicyclomine, IRON PO, Vitamin C, famotidine, atorvastatin, Vitamin D (Ergocalciferol), fluorouracil, and ALPRAZolam.  Meds ordered this encounter  Medications  . fluorouracil (EFUDEX) 5 % cream  . hydrochlorothiazide (HYDRODIURIL) 12.5 MG tablet    Sig: Take 1 tablet (12.5 mg total) by mouth daily.    Dispense:  90 tablet    Refill:  1    Order Specific Question:   Supervising Provider  Answer:   Cassandria Anger [[id number]]  . enalapril (VASOTEC) 5 MG tablet    Sig: Take 1 tablet (5 mg total) by mouth 2 (two) times daily.    Dispense:  180 tablet    Refill:  1    Order Specific Question:   Supervising Provider    Answer:   Cassandria Anger [[id number]]  . metoprolol succinate (TOPROL-XL) 25 MG 24 hr tablet    Sig: Take 1 tablet (25 mg total) by mouth daily.    Dispense:  30 tablet    Refill:  1    Order Specific Question:   Supervising Provider    Answer:   Cassandria Anger [[id number]]  . diphenoxylate-atropine (LOMOTIL) 2.5-0.025 MG tablet    Sig: Take 1 tablet by mouth 3 (three) times daily as needed for diarrhea or loose stools.    Dispense:  90 tablet    Refill:  1    Order Specific Question:   Supervising Provider    Answer:   Cassandria Anger [[id number]]  . ALPRAZolam (XANAX) 0.5 MG tablet    Sig: Take 1 tablet (0.5 mg total) by mouth at bedtime as needed for anxiety.    Dispense:  30 tablet    Refill:  2    Do not refill before 09/16/2017    Order Specific Question:   Supervising Provider    Answer:   Cassandria Anger [[id number]]    Follow-up: Return in about 4 weeks (around  10/05/2017) for HTN (fasting for repeat labs: BMP hepatic panel, lipid panel) at [facility] location[facility].  Wilfred Lacy, NP

## 2017-09-07 NOTE — Assessment & Plan Note (Addendum)
Waxing and waning BP Enalapril dose was already increased by patient. No change in BP. Today I added metoprolol XL 25mg  once a day, maintain enalapril 5mg  BID and decrease HCTZ to 12.5mg  once a day. Amlodipine not used due to chronic LE edema. F/up in 4weeks, repeat labs

## 2017-10-01 ENCOUNTER — Other Ambulatory Visit: Payer: Medicare Other

## 2017-10-01 ENCOUNTER — Encounter: Payer: Self-pay | Admitting: Family Medicine

## 2017-10-01 ENCOUNTER — Ambulatory Visit (INDEPENDENT_AMBULATORY_CARE_PROVIDER_SITE_OTHER): Payer: Medicare Other | Admitting: Family Medicine

## 2017-10-01 ENCOUNTER — Ambulatory Visit: Payer: Self-pay

## 2017-10-01 VITALS — BP 110/70 | HR 69 | Ht 62.0 in | Wt 119.0 lb

## 2017-10-01 DIAGNOSIS — M25551 Pain in right hip: Secondary | ICD-10-CM

## 2017-10-01 DIAGNOSIS — M25552 Pain in left hip: Secondary | ICD-10-CM | POA: Diagnosis not present

## 2017-10-01 DIAGNOSIS — I639 Cerebral infarction, unspecified: Secondary | ICD-10-CM

## 2017-10-01 DIAGNOSIS — M7061 Trochanteric bursitis, right hip: Secondary | ICD-10-CM

## 2017-10-01 DIAGNOSIS — M7062 Trochanteric bursitis, left hip: Secondary | ICD-10-CM | POA: Diagnosis not present

## 2017-10-01 NOTE — Progress Notes (Signed)
[name] [address] [place], [place] [address] Phone: [phone] Subjective:    I'm seeing this patient by the request  of:    CC: Bilateral hip pain  [id number]  Susan [name] is a 83 y.o. female coming in with complaint of bilateral hip pain. Has had greater trochanter bursitis previously as well as left hip arthritis. Patient was last given bilateral greater trochanteric injections back in May. Does have known arthritic changes of the back. Was doing better with gabapentin but concern that was causing some swelling of the legs. Patient discontinued due to continued to have swelling of the legs. Patient states  she discontinued the medication and then unfortunate still having the swelling. Following up with her primary care provider for this. Patient sates that the pain though in the hips is keeping her up at night. Mild back pain but not as bad as much she's had previously.     Past Medical History:  Diagnosis Date  . Actinic keratosis, hx of    chest wall (2012)  . Allergic rhinitis   . Anxiety   . Arthritis    "spine, hips" (01/30/2017)  . Bursitis   . Cellulitis of right foot 01/30/2017  . Colon polyps   . Diverticulosis   . Fibromyalgia    "qwhere" (01/30/2017)  . Gallstones   . GERD (gastroesophageal reflux disease)   . High cholesterol   . History of kidney stones   . History of stomach ulcers   . Hx of squamous cell carcinoma excision    right mandible (2011), right arm (2014)  . Hypertension   . IBS (irritable bowel syndrome)   . Osteoarthritis   . Pneumonia 2000s X 2   "twice" (01/30/2017)  . Pyloric stenosis   . Stroke [facility]) 10/2014   denies residual on 01/30/2017   Past Surgical History:  Procedure Laterality Date  . APPENDECTOMY    . CARDIOVASCULAR STRESS TEST  04/2016   nuclear cardiolite stress test done in [place] (no ischemia, no LVH, no LV systolic function)  . CATARACT EXTRACTION W/ INTRAOCULAR LENS  IMPLANT,  BILATERAL Bilateral   . CHOLECYSTECTOMY OPEN    . COLONOSCOPY  2009, 2014  . DIAGNOSTIC MAMMOGRAM  2009, 2012  . TONSILLECTOMY     Social History   Social History  . Marital status: Widowed    Spouse name: N/A  . Number of children: N/A  . Years of education: N/A   Social History Main Topics  . Smoking status: Never Smoker  . Smokeless tobacco: Never Used  . Alcohol use Yes     Comment: glass of red wine maybe once week  . Drug use: No  . Sexual activity: Not Asked   Other Topics Concern  . None   Social History Narrative  . None   Allergies  Allergen Reactions  . Butrans [Buprenorphine] Other (See Comments)    Severe chest pains  . Codeine Other (See Comments)    Severe Chest pains  . Morphine And Related Other (See Comments)    Severe chest pains   Family History  Problem Relation Age of Onset  . Stroke Mother   . Heart attack Mother   . Stroke Father   . Heart attack Father   . Heart attack Maternal Grandmother   . Stroke Maternal Grandmother   . Stroke Paternal Grandmother   . Heart attack Paternal Grandmother   . Heart attack Maternal Grandfather   . Stroke Maternal Grandfather   .  Stroke Paternal Grandfather   . Heart attack Paternal Grandfather   . Colon cancer Neg Hx   . Pancreatic cancer Neg Hx   . Stomach cancer Neg Hx   . Esophageal cancer Neg Hx      Past medical history, social, surgical and family history all reviewed in electronic medical record.  No pertanent information unless stated regarding to the chief complaint.   Review of Systems:Review of systems updated and as accurate as of 10/01/17  No headache, visual changes, nausea, vomiting, diarrhea, constipation, dizziness, abdominal pain, skin rash, fevers, chills, night sweats, weight loss, swollen lymph nodes, body aches, joint swelling, muscle aches, chest pain, shortness of breath, mood changes.   Objective  Blood pressure 110/70, pulse 69, height 5\' 2"  (1.575 m), weight 119 lb (54  kg), SpO2 96 %. Systems examined below as of 10/01/17   General: No apparent distress alert and oriented x3 mood and affect normal, dressed appropriately.  HEENT: Pupils equal, extraocular movements intact  Respiratory: Patient's speak in full sentences and does not appear short of breath  Cardiovascular: No lower extremity edema, non tender, no erythema  Skin: Warm dry intact with no signs of infection or rash on extremities or on axial skeleton.  Abdomen: Soft nontender  Neuro: Cranial nerves II through XII are intact, neurovascularly intact in all extremities with 2+ DTRs and 2+ pulses.  Lymph: No lymphadenopathy of posterior or anterior cervical chain or axillae bilaterally.  Gait Antalgic gait MSK:  Non tender with full range of motion and good stability and symmetric strength and tone of shoulders, elbows, wrist,  knee and ankles bilaterally. Severe arthritic changes of multiple joints Hip exam shows severe tenderness to palpation over the greater trochanteric area. Patient does have some mild swelling on the left lateral aspect of the hip. Patient does have decreasing internal range of motion of the left hip compared to the right hip. Negative straight leg test but positive hamstring tightness bilaterally.   Procedure: Real-time Ultrasound Guided Injection of left  greater trochanteric bursitis secondary to patient's body habitus Device: GE Logiq Q7  Ultrasound guided injection is preferred based studies that show increased duration, increased effect, greater accuracy, decreased procedural pain, increased response rate, and decreased cost with ultrasound guided versus blind injection.  Verbal informed consent obtained.  Time-out conducted.  Noted no overlying erythema, induration, or other signs of local infection.  Skin prepped in a sterile fashion.  Local anesthesia: Topical Ethyl chloride.  With sterile technique and under real time ultrasound guidance:  Greater trochanteric area  was visualized and patient's bursa was noted. A 22-gauge 3 inch needle was inserted and 4 cc of 0.5% Marcaine andspirated 7 ml ofblood-tinged fluidthen injected 1 cc of Kenalog 40 mg/dL was injected. Pictures taken Completed without difficulty  Pain immediately resolved suggesting accurate placement of the medication.  Advised to call if fevers/chills, erythema, induration, drainage, or persistent bleeding.  Images permanently stored and available for review in the ultrasound unit.  Impression: Technically successful ultrasound guided injection.   Procedure: Real-time Ultrasound Guided Injection of right greater trochanteric bursitis secondary to patient's body habitus Device: GE Logiq Q7 Ultrasound guided injection is preferred based studies that show increased duration, increased effect, greater accuracy, decreased procedural pain, increased response rate, and decreased cost with ultrasound guided versus blind injection.  Verbal informed consent obtained.  Time-out conducted.  Noted no overlying erythema, induration, or other signs of local infection.  Skin prepped in a sterile fashion.  Local anesthesia: Topical  Ethyl chloride.  With sterile technique and under real time ultrasound guidance:  Greater trochanteric area was visualized and patient's bursa was noted. A 22-gauge 3 inch needle was inserted and 4 cc of 0.5% Marcaine and 1 cc of Kenalog 40 mg/dL was injected. Pictures taken Completed without difficulty  Pain immediately resolved suggesting accurate placement of the medication.  Advised to call if fevers/chills, erythema, induration, drainage, or persistent bleeding.  Images permanently stored and available for review in the ultrasound unit.  Impression: Technically successful ultrasound guided injection.   Impression and Recommendations:     This case required medical decision making of moderate complexity.      Note: This dictation was prepared with Dragon dictation along  with smaller phrase technology. Any transcriptional errors that result from this process are unintentional.

## 2017-10-01 NOTE — Assessment & Plan Note (Signed)
Bilateral injections given today. Worsening symptoms. Patient can have some underlying radiculopathy. Patient does not want to start on the gabapentin again because the secondary to the swelling. We discussed other medications but would patient's other comorbidities and do not think that it is worth the potential risk. We discussed icing regimen, home exercise, which activities to do a which ones to avoid. Patient follow-up again in 4 weeks for further evaluation and treatment.

## 2017-10-01 NOTE — Patient Instructions (Signed)
Good to see you  Consider 100mg  of the gabapentin at night Injected both hips today  Ice is your friend.  Can repeat every 10 weeks if needed Make another appointment in 10 weeks Discuss lasix with Baldo Ash.

## 2017-10-01 NOTE — Addendum Note (Signed)
Addended by: Douglass Rivers T on: 10/01/2017 04:24 PM   Modules accepted: Orders

## 2017-10-04 DIAGNOSIS — D0439 Carcinoma in situ of skin of other parts of face: Secondary | ICD-10-CM | POA: Diagnosis not present

## 2017-10-10 ENCOUNTER — Ambulatory Visit: Payer: Medicare Other | Admitting: Nurse Practitioner

## 2017-10-16 ENCOUNTER — Other Ambulatory Visit (INDEPENDENT_AMBULATORY_CARE_PROVIDER_SITE_OTHER): Payer: Medicare Other

## 2017-10-16 ENCOUNTER — Ambulatory Visit (INDEPENDENT_AMBULATORY_CARE_PROVIDER_SITE_OTHER): Payer: Medicare Other | Admitting: Internal Medicine

## 2017-10-16 ENCOUNTER — Encounter: Payer: Self-pay | Admitting: Internal Medicine

## 2017-10-16 VITALS — BP 142/84 | HR 66 | Temp 98.2°F | Ht 62.0 in | Wt 114.0 lb

## 2017-10-16 DIAGNOSIS — I639 Cerebral infarction, unspecified: Secondary | ICD-10-CM | POA: Diagnosis not present

## 2017-10-16 DIAGNOSIS — I10 Essential (primary) hypertension: Secondary | ICD-10-CM

## 2017-10-16 DIAGNOSIS — Z1329 Encounter for screening for other suspected endocrine disorder: Secondary | ICD-10-CM

## 2017-10-16 DIAGNOSIS — E782 Mixed hyperlipidemia: Secondary | ICD-10-CM

## 2017-10-16 DIAGNOSIS — K224 Dyskinesia of esophagus: Secondary | ICD-10-CM | POA: Diagnosis not present

## 2017-10-16 HISTORY — DX: Dyskinesia of esophagus: K22.4

## 2017-10-16 LAB — LIPID PANEL
CHOLESTEROL: 225 mg/dL — AB (ref 0–200)
HDL: 90.7 mg/dL (ref >39.00)
LDL CALC: 104 mg/dL — AB (ref 0–99)
NonHDL: 134.59
TRIGLYCERIDES: 154 mg/dL — AB (ref 0.0–149.0)
Total CHOL/HDL Ratio: 2
VLDL: 30.8 mg/dL (ref 0.0–40.0)

## 2017-10-16 LAB — TSH: TSH: 1.77 u[IU]/mL (ref 0.35–4.50)

## 2017-10-16 LAB — COMPREHENSIVE METABOLIC PANEL
ALBUMIN: 4 g/dL (ref 3.5–5.2)
ALT: 15 U/L (ref 0–35)
AST: 17 U/L (ref 0–37)
Alkaline Phosphatase: 67 U/L (ref 39–117)
BUN: 24 mg/dL — AB (ref 6–23)
CHLORIDE: 102 meq/L (ref 96–112)
CO2: 31 meq/L (ref 19–32)
Calcium: 9.5 mg/dL (ref 8.4–10.5)
Creatinine, Ser: 0.67 mg/dL (ref 0.40–1.20)
GFR: 87.71 mL/min (ref >60.00)
Glucose, Bld: 135 mg/dL — ABNORMAL HIGH (ref 70–99)
POTASSIUM: 4.1 meq/L (ref 3.5–5.1)
SODIUM: 141 meq/L (ref 135–145)
Total Bilirubin: 0.3 mg/dL (ref 0.2–1.2)
Total Protein: 7 g/dL (ref 6.0–8.3)

## 2017-10-16 LAB — CBC WITH DIFFERENTIAL/PLATELET
BASOS PCT: 0.5 % (ref 0.0–3.0)
Basophils Absolute: 0 10*3/uL (ref 0.0–0.1)
Eosinophils Absolute: 0.1 10*3/uL (ref 0.0–0.7)
Eosinophils Relative: 1.6 % (ref 0.0–5.0)
HCT: 41 % (ref 36.0–46.0)
HEMOGLOBIN: 13.5 g/dL (ref 12.0–15.0)
Lymphocytes Relative: 19.2 % (ref 12.0–46.0)
Lymphs Abs: 1.7 10*3/uL (ref 0.7–4.0)
MCHC: 32.9 g/dL (ref 30.0–36.0)
MCV: 94.4 fl (ref 78.0–100.0)
MONO ABS: 0.8 10*3/uL (ref 0.1–1.0)
Monocytes Relative: 8.9 % (ref 3.0–12.0)
NEUTROS ABS: 6.2 10*3/uL (ref 1.4–7.7)
Neutrophils Relative %: 69.8 % (ref 43.0–77.0)
PLATELETS: 258 10*3/uL (ref 150.0–400.0)
RBC: 4.34 Mil/uL (ref 3.87–5.11)
RDW: 13.5 % (ref 11.5–15.5)
WBC: 8.9 10*3/uL (ref 4.0–10.5)

## 2017-10-16 MED ORDER — LOSARTAN POTASSIUM 100 MG PO TABS: 100.0000 mg | ORAL_TABLET | Freq: Every day | ORAL | 3 refills | Status: DC

## 2017-10-16 NOTE — Patient Instructions (Addendum)
OK to stop the enalapril  Please take all new medication as prescribed - the losartan 100 mg  Please continue all other medications as before, and refills have been done if requested.  Please have the pharmacy call with any other refills you may need.  Please continue your efforts at being more active, low cholesterol diet, and weight control.  You are otherwise up to date with prevention measures today.  Please keep your appointments with your specialists as you may have planned  Please go to the LAB in the Basement (turn left off the elevator) for the tests to be done today  You will be contacted by phone if any changes need to be made immediately.  Otherwise, you will receive a letter about your results with an explanation, but please check with MyChart first.  Please remember to sign up for MyChart if you have not done so, as this will be important to you in the future with finding out test results, communicating by private email, and scheduling acute appointments online when needed.  Please return in 6 months, or sooner if needed

## 2017-10-16 NOTE — Assessment & Plan Note (Addendum)
With distal RUE weakness since then (just the hand per pt), stable overall by history and exam, and pt to continue medical treatment as before,  to f/u any worsening symptoms or concerns

## 2017-10-16 NOTE — Progress Notes (Signed)
Subjective:    Patient ID: Susan [name], female    DOB: February 26, 1924, 83 y.o.   MRN: [id number]  HPI  Here to f/u; overall doing ok,  Pt denies chest pain, increasing sob or doe, wheezing, orthopnea, PND, increased LE swelling, palpitations, dizziness or syncope.  Pt denies new neurological symptoms such as new headache, or facial or extremity weakness or numbness.  Pt denies polydipsia, polyuria, or low sugar episode.  Pt states overall good compliance with meds, mostly trying to follow appropriate diet, with wt overall down several lbs recently. Denies reduced appetite,.   Wt Readings from Last 3 Encounters:  10/16/17 114 lb (51.7 kg)  10/01/17 119 lb (54 kg)  09/07/17 116 lb (52.6 kg)  Did have recent BP med change due to higher values and feet swelling. Since then BP at home seems labile with low value of 124/70 and high of 198/105, but seems average is likely more in the 140/-160 range.  Recently has hct decrsaed from 25 to 12.5, and  New metoprololXL 25 mg daily.   BP Readings from Last 3 Encounters:  10/16/17 (!) 142/84  10/01/17 110/70  09/07/17 (!) 160/78  Also c/o intermitent mild dysphagia chronic, mentions a GI ordered Barium swallow with esophageal motility dysfunction years ago, but now worse recently  But has to monitor pill size.  Just moved here from [place] as she has family here.  Has been seeing Dr Tamala Julian;   Had myalgias with lipitor 20 mg, but can tolerate the 10 mg.   Sold her car in Feb, lives in independent living [place] which transports her here.   Overall pain level is much improved with seeing sports medicine, especially with improved hip pain.  No other interval hx Past Medical History:  Diagnosis Date  . Actinic keratosis, hx of    chest wall (2012)  . Allergic rhinitis   . Anxiety   . Arthritis    "spine, hips" (01/30/2017)  . Bursitis   . Cellulitis of right foot 01/30/2017  . Colon polyps   . Diverticulosis   . Esophageal dysmotilities 10/16/2017  .  Fibromyalgia    "qwhere" (01/30/2017)  . Gallstones   . GERD (gastroesophageal reflux disease)   . High cholesterol   . History of kidney stones   . History of stomach ulcers   . Hx of squamous cell carcinoma excision    right mandible (2011), right arm (2014)  . Hypertension   . IBS (irritable bowel syndrome)   . Osteoarthritis   . Pneumonia 2000s X 2   "twice" (01/30/2017)  . Pyloric stenosis   . Stroke [facility]) 10/2014   denies residual on 01/30/2017   Past Surgical History:  Procedure Laterality Date  . APPENDECTOMY    . CARDIOVASCULAR STRESS TEST  04/2016   nuclear cardiolite stress test done in [place] (no ischemia, no LVH, no LV systolic function)  . CATARACT EXTRACTION W/ INTRAOCULAR LENS  IMPLANT, BILATERAL Bilateral   . CHOLECYSTECTOMY OPEN    . COLONOSCOPY  2009, 2014  . DIAGNOSTIC MAMMOGRAM  2009, 2012  . TONSILLECTOMY      reports that  has never smoked. she has never used smokeless tobacco. She reports that she drinks alcohol. She reports that she does not use drugs. family history includes Heart attack in her father, maternal grandfather, maternal grandmother, mother, paternal grandfather, and paternal grandmother; Stroke in her father, maternal grandfather, maternal grandmother, mother, paternal grandfather, and paternal grandmother. Allergies  Allergen Reactions  . Butrans [  Buprenorphine] Other (See Comments)    Severe chest pains  . Codeine Other (See Comments)    Severe Chest pains  . Morphine And Related Other (See Comments)    Severe chest pains   Current Outpatient Medications on File Prior to Visit  Medication Sig Dispense Refill  . acetaminophen (TYLENOL) 650 MG CR tablet Take 650 mg by mouth every 8 (eight) hours as needed for pain.    Marland Kitchen ALPRAZolam (XANAX) 0.5 MG tablet Take 1 tablet (0.5 mg total) by mouth at bedtime as needed for anxiety. 30 tablet 2  . Ascorbic Acid (VITAMIN C) 250 MG CHEW Chew 250 mg by mouth 3 (three) times a week. 2 tab 3 time a  week    . aspirin EC 325 MG tablet Take 325 mg by mouth daily.    Marland Kitchen atorvastatin (LIPITOR) 10 MG tablet Take 1 tablet (10 mg total) by mouth daily. 90 tablet 1  . dicyclomine (BENTYL) 10 MG capsule Take 10 mg by mouth 2 (two) times daily.     . diphenoxylate-atropine (LOMOTIL) 2.5-0.025 MG tablet Take 1 tablet by mouth 3 (three) times daily as needed for diarrhea or loose stools. 90 tablet 1  . famotidine (PEPCID) 20 MG tablet Take 1 tablet (20 mg total) by mouth 2 (two) times daily. 60 tablet 6  . fexofenadine (ALLEGRA) 180 MG tablet Take 180 mg by mouth daily.    . fluorouracil (EFUDEX) 5 % cream     . hydrochlorothiazide (HYDRODIURIL) 12.5 MG tablet Take 1 tablet (12.5 mg total) by mouth daily. 90 tablet 1  . IRON PO Take 65 mg by mouth 3 (three) times a week.    . metoprolol succinate (TOPROL-XL) 25 MG 24 hr tablet Take 1 tablet (25 mg total) by mouth daily. 30 tablet 1  . Omega-3 1000 MG CAPS Take by mouth.    . Vitamin D, Ergocalciferol, (DRISDOL) 50000 units CAPS capsule Take 1 capsule (50,000 Units total) by mouth every 7 (seven) days. 12 capsule 0   No current facility-administered medications on file prior to visit.    Review of Systems  Constitutional: Negative for other unusual diaphoresis or sweats HENT: Negative for ear discharge or swelling Eyes: Negative for other worsening visual disturbances Respiratory: Negative for stridor or other swelling  Gastrointestinal: Negative for worsening distension or other blood Genitourinary: Negative for retention or other urinary change Musculoskeletal: Negative for other MSK pain or swelling Skin: Negative for color change or other new lesions Neurological: Negative for worsening tremors and other numbness  Psychiatric/Behavioral: Negative for worsening agitation or other fatigue All other system neg per pt    Objective:   Physical Exam BP (!) 142/84   Pulse 66   Temp 98.2 F (36.8 C) (Oral)   Ht 5\' 2"  (1.575 m)   Wt 114 lb  (51.7 kg)   SpO2 98%   BMI 20.85 kg/m  VS noted,  Constitutional: Pt appears in NAD HENT: Head: NCAT.  Right Ear: External ear normal.  Left Ear: External ear normal.  Eyes: . Pupils are equal, round, and reactive to light. Conjunctivae and EOM are normal Nose: without d/c or deformity Neck: Neck supple. Gross normal ROM Cardiovascular: Normal rate and regular rhythm.   Pulmonary/Chest: Effort normal and breath sounds without rales or wheezing.  Abd:  Soft, NT, ND, + BS, no organomegaly Neurological: Pt is alert. At baseline orientation, motor grossly intact except for mild distal RUE motor 4+/56 Skin: Skin is warm. No rashes, other new  lesions, no LE edema Psychiatric: Pt behavior is normal without agitation  No other exam findings    Assessment & Plan:

## 2017-10-17 ENCOUNTER — Other Ambulatory Visit (INDEPENDENT_AMBULATORY_CARE_PROVIDER_SITE_OTHER): Payer: Medicare Other

## 2017-10-17 DIAGNOSIS — R739 Hyperglycemia, unspecified: Secondary | ICD-10-CM

## 2017-10-17 LAB — HEMOGLOBIN A1C: HEMOGLOBIN A1C: 5.8 % (ref 4.6–6.5)

## 2017-10-18 NOTE — Assessment & Plan Note (Signed)
Mild, declines GI referral,  to f/u any worsening symptoms or concerns

## 2017-10-18 NOTE — Assessment & Plan Note (Signed)
Lab Results  Component Value Date   LDLCALC 104 (H) 10/16/2017  stable overall by history and exam, recent data reviewed with pt, and pt to continue medical treatment as before,  to f/u any worsening symptoms or concerns

## 2017-10-18 NOTE — Assessment & Plan Note (Signed)
Ok for change enalapril to losartan 100 qd for better control,  to f/u any worsening symptoms or concerns

## 2017-11-02 ENCOUNTER — Other Ambulatory Visit: Payer: Self-pay | Admitting: Family Medicine

## 2017-11-02 ENCOUNTER — Encounter: Payer: Self-pay | Admitting: Internal Medicine

## 2017-11-02 DIAGNOSIS — I10 Essential (primary) hypertension: Secondary | ICD-10-CM

## 2017-11-02 MED ORDER — VITAMIN D (ERGOCALCIFEROL) 1.25 MG (50000 UNIT) PO CAPS: 50000.0000 [IU] | ORAL_CAPSULE | ORAL | 0 refills | Status: DC

## 2017-11-02 MED ORDER — METOPROLOL SUCCINATE ER 25 MG PO TB24: 25.0000 mg | ORAL_TABLET | Freq: Every day | ORAL | 1 refills | Status: DC

## 2017-11-02 MED ORDER — FAMOTIDINE 20 MG PO TABS: 20.0000 mg | ORAL_TABLET | Freq: Two times a day (BID) | ORAL | 6 refills | Status: DC

## 2017-11-05 ENCOUNTER — Encounter: Payer: Self-pay | Admitting: Family Medicine

## 2017-11-05 ENCOUNTER — Ambulatory Visit: Payer: Medicare Other | Admitting: Nurse Practitioner

## 2017-11-07 ENCOUNTER — Ambulatory Visit: Payer: Medicare Other | Admitting: Family Medicine

## 2017-12-11 ENCOUNTER — Encounter: Payer: Self-pay | Admitting: Internal Medicine

## 2017-12-11 NOTE — Progress Notes (Signed)
[name] [address], [place] [address] Phone: [phone] Subjective:    I'm seeing this patient by the request  of:    CC: Back and bilateral hip pain follow-up  [id number]  Susan [name] is a 84 y.o. female coming in with complaint of back and bilateral hip pain.  Found to have greater trochanteric bursitis.  Last injection was given 10 weeks ago.  Patient states that she did have relief from the last injection and would like them again.   Past Medical History:  Diagnosis Date  . Actinic keratosis, hx of    chest wall (2012)  . Allergic rhinitis   . Anxiety   . Arthritis    "spine, hips" (01/30/2017)  . Bursitis   . Cellulitis of right foot 01/30/2017  . Colon polyps   . Diverticulosis   . Esophageal dysmotilities 10/16/2017  . Fibromyalgia    "qwhere" (01/30/2017)  . Gallstones   . GERD (gastroesophageal reflux disease)   . High cholesterol   . History of kidney stones   . History of stomach ulcers   . Hx of squamous cell carcinoma excision    right mandible (2011), right arm (2014)  . Hypertension   . IBS (irritable bowel syndrome)   . Osteoarthritis   . Pneumonia 2000s X 2   "twice" (01/30/2017)  . Pyloric stenosis   . Stroke [facility]) 10/2014   denies residual on 01/30/2017   Past Surgical History:  Procedure Laterality Date  . APPENDECTOMY    . CARDIOVASCULAR STRESS TEST  04/2016   nuclear cardiolite stress test done in [place] (no ischemia, no LVH, no LV systolic function)  . CATARACT EXTRACTION W/ INTRAOCULAR LENS  IMPLANT, BILATERAL Bilateral   . CHOLECYSTECTOMY OPEN    . COLONOSCOPY  2009, 2014  . DIAGNOSTIC MAMMOGRAM  2009, 2012  . TONSILLECTOMY     Social History   Socioeconomic History  . Marital status: Widowed    Spouse name: None  . Number of children: None  . Years of education: None  . Highest education level: None  Social Needs  . Financial resource strain: None  . Food insecurity - worry:  None  . Food insecurity - inability: None  . Transportation needs - medical: None  . Transportation needs - non-medical: None  Occupational History  . None  Tobacco Use  . Smoking status: Never Smoker  . Smokeless tobacco: Never Used  Substance and Sexual Activity  . Alcohol use: Yes    Comment: glass of red wine maybe once week  . Drug use: No  . Sexual activity: None  Other Topics Concern  . None  Social History Narrative  . None   Allergies  Allergen Reactions  . Butrans [Buprenorphine] Other (See Comments)    Severe chest pains  . Codeine Other (See Comments)    Severe Chest pains  . Morphine And Related Other (See Comments)    Severe chest pains   Family History  Problem Relation Age of Onset  . Stroke Mother   . Heart attack Mother   . Stroke Father   . Heart attack Father   . Heart attack Maternal Grandmother   . Stroke Maternal Grandmother   . Stroke Paternal Grandmother   . Heart attack Paternal Grandmother   . Heart attack Maternal Grandfather   . Stroke Maternal Grandfather   . Stroke Paternal Grandfather   . Heart attack Paternal Grandfather   . Colon cancer Neg Hx   .  Pancreatic cancer Neg Hx   . Stomach cancer Neg Hx   . Esophageal cancer Neg Hx      Past medical history, social, surgical and family history all reviewed in electronic medical record.  No pertanent information unless stated regarding to the chief complaint.   Review of Systems:Review of systems updated and as accurate as of 12/12/17  No headache, visual changes, nausea, vomiting, diarrhea, constipation, dizziness, abdominal pain, skin rash, fevers, chills, night sweats, weight loss, swollen lymph nodes, body aches, joint swelling,  chest pain, shortness of breath, mood changes.  Positive muscle aches  Objective  Blood pressure 120/76, pulse 62, height 5\' 2"  (1.575 m), weight 114 lb (51.7 kg), SpO2 96 %. Systems examined below as of 12/12/17   General: No apparent distress alert  and oriented x3 mood and affect normal, dressed appropriately.  HEENT: Pupils equal, extraocular movements intact  Respiratory: Patient's speak in full sentences and does not appear short of breath  Cardiovascular: No lower extremity edema, non tender, no erythema  Skin: Warm dry intact with no signs of infection or rash on extremities or on axial skeleton.  Abdomen: Soft nontender  Neuro: Cranial nerves II through XII are intact, neurovascularly intact in all extremities with 2+ DTRs and 2+ pulses.  Lymph: No lymphadenopathy of posterior or anterior cervical chain or axillae bilaterally.  Gait antalgic gait MSK:  Non tender with full range of motion and good stability and symmetric strength and tone of shoulders, elbows, wrist,  knee and ankles bilaterally.  Significant arthritic changes Bilateral hips does show some decreased range of motion in all planes.  Severe tenderness over the greater trochanteric area.  Mild positive neurovascularly intact distally.  2+ distal pulses.  Strength is 4 out of 5 but symmetric  After verbal consent patient was prepped in sterile fashion with alcohol swabs. Ethyl chloride used patient was injected with a 22-gauge 3 inch needle into the RIGHT lateral hip in the greater trochanteric area under ultrasound guidance. Picture was taken. Patient had 4 cc of 0.5% Marcaine and 1 cc of Kenalog 40 mg/dL injected. Patient tolerated the procedure well and no blood loss. Pain completely resolved after injection stating proper placement. Post injection instructions given.   After verbal consent patient was prepped in sterile fashion with alcohol swabs. Ethyl chloride used patient was injected with a 22-gauge 3 inch needle into the left lateral hip in the greater trochanteric area under ultrasound guidance. Picture was taken. Patient had 4 cc of 0.5% Marcaine and 1 cc of Kenalog 40 mg/dL injected. Patient tolerated the procedure well and no blood loss. Pain completely resolved  after injection stating proper placement. Post injection instructions given.    Impression and Recommendations:     This case required medical decision making of moderate complexity.      Note: This dictation was prepared with Dragon dictation along with smaller phrase technology. Any transcriptional errors that result from this process are unintentional.

## 2017-12-12 ENCOUNTER — Encounter: Payer: Self-pay | Admitting: Family Medicine

## 2017-12-12 ENCOUNTER — Other Ambulatory Visit: Payer: Self-pay | Admitting: Internal Medicine

## 2017-12-12 ENCOUNTER — Ambulatory Visit (INDEPENDENT_AMBULATORY_CARE_PROVIDER_SITE_OTHER): Payer: Medicare Other | Admitting: Family Medicine

## 2017-12-12 DIAGNOSIS — M7061 Trochanteric bursitis, right hip: Secondary | ICD-10-CM

## 2017-12-12 DIAGNOSIS — M7062 Trochanteric bursitis, left hip: Secondary | ICD-10-CM | POA: Diagnosis not present

## 2017-12-12 NOTE — Patient Instructions (Signed)
Good to see you See me again in 10 weeks 

## 2017-12-12 NOTE — Assessment & Plan Note (Addendum)
Patient given bilateral injections again today..  Follow-up again in 10 weeks topical anti-inflammatories given.  Discussed icing regimen and home exercise.

## 2017-12-17 ENCOUNTER — Telehealth: Payer: Self-pay | Admitting: Internal Medicine

## 2017-12-17 DIAGNOSIS — F418 Other specified anxiety disorders: Secondary | ICD-10-CM

## 2017-12-17 MED ORDER — ALPRAZOLAM 0.5 MG PO TABS: 0.5000 mg | ORAL_TABLET | Freq: Every evening | ORAL | 2 refills | Status: DC | PRN

## 2017-12-17 NOTE — Telephone Encounter (Signed)
Done erx 

## 2018-01-15 ENCOUNTER — Encounter: Payer: Self-pay | Admitting: Internal Medicine

## 2018-01-16 ENCOUNTER — Telehealth: Payer: Self-pay | Admitting: Internal Medicine

## 2018-01-16 NOTE — Telephone Encounter (Signed)
Anywhere else that we could schedule the patient with Dr Jenny Reichmann?

## 2018-01-16 NOTE — Telephone Encounter (Signed)
Copied from [place]. Topic: Appointment Scheduling - Scheduling Inquiry for Clinic >> Jan 16, 2018  2:25 PM Neva Seat wrote: Pt's daughter called to say pt is getting worse from last visit w/ Dr. Jenny Reichmann.  He told them to call back if she doesn't get better.  Pt wants to only see Dr. Jenny Reichmann this week.  There was no available time to fit pt in, but the [place] slots on Friday.

## 2018-01-16 NOTE — Telephone Encounter (Signed)
Schedule in one of the same day slots on Friday. Thanks!

## 2018-01-17 NOTE — Telephone Encounter (Signed)
Appointment scheduled.

## 2018-01-18 ENCOUNTER — Encounter: Payer: Self-pay | Admitting: Internal Medicine

## 2018-01-18 ENCOUNTER — Ambulatory Visit (INDEPENDENT_AMBULATORY_CARE_PROVIDER_SITE_OTHER): Payer: Medicare Other | Admitting: Internal Medicine

## 2018-01-18 ENCOUNTER — Ambulatory Visit (INDEPENDENT_AMBULATORY_CARE_PROVIDER_SITE_OTHER)
Admission: RE | Admit: 2018-01-18 | Discharge: 2018-01-18 | Disposition: A | Discharge status: Home / Self Care | Payer: Medicare Other | Source: Ambulatory Visit | Attending: Internal Medicine | Admitting: Internal Medicine

## 2018-01-18 VITALS — BP 128/86 | HR 61 | Temp 98.3°F | Ht 62.0 in | Wt 114.0 lb

## 2018-01-18 DIAGNOSIS — R059 Cough, unspecified: Secondary | ICD-10-CM | POA: Insufficient documentation

## 2018-01-18 DIAGNOSIS — I10 Essential (primary) hypertension: Secondary | ICD-10-CM

## 2018-01-18 DIAGNOSIS — R062 Wheezing: Secondary | ICD-10-CM | POA: Diagnosis not present

## 2018-01-18 DIAGNOSIS — R05 Cough: Secondary | ICD-10-CM | POA: Diagnosis not present

## 2018-01-18 DIAGNOSIS — R0602 Shortness of breath: Secondary | ICD-10-CM | POA: Diagnosis not present

## 2018-01-18 MED ORDER — METHYLPREDNISOLONE ACETATE 80 MG/ML IJ SUSP
80.0000 mg | Freq: Once | INTRAMUSCULAR | Status: AC
  Administered 2018-01-18: 80 mg via INTRAMUSCULAR

## 2018-01-18 MED ORDER — PREDNISONE 10 MG PO TABS
ORAL_TABLET | ORAL | 0 refills | Status: DC
Start: 2018-01-18 — End: 2018-05-01

## 2018-01-18 MED ORDER — HYDROCODONE-HOMATROPINE 5-1.5 MG/5ML PO SYRP: 5.0000 mL | ORAL_SOLUTION | Freq: Four times a day (QID) | ORAL | 0 refills | Status: AC | PRN

## 2018-01-18 MED ORDER — LEVOFLOXACIN 250 MG PO TABS: 250.0000 mg | ORAL_TABLET | Freq: Every day | ORAL | 0 refills | Status: AC

## 2018-01-18 NOTE — Patient Instructions (Signed)
You had the steroid shot today  Please take all new medication as prescribed - the antibiotic, cough medicine if needed, and Prednisone  Please continue all other medications as before, and refills have been done if requested.  Please have the pharmacy call with any other refills you may need.  Please keep your appointments with your specialists as you may have planned  Please go to the XRAY Department in the Basement (go straight as you get off the elevator) for the x-ray testing  You will be contacted by phone if any changes need to be made immediately.  Otherwise, you will receive a letter about your results with an explanation, but please check with MyChart first.  Please remember to sign up for MyChart if you have not done so, as this will be important to you in the future with finding out test results, communicating by private email, and scheduling acute appointments online when needed.

## 2018-01-18 NOTE — Progress Notes (Signed)
Subjective:    Patient ID: Susan [name], female    DOB: February 09, 1924, 84 y.o.   MRN: [id number]  HPI   Here with acute onset mild to mod 7 days gradually worsening ST, HA, myalgias, hoarseness, general weakness and malaise, with prod cough greenish sputum with much chest congestion, but Pt denies chest pain, increased sob or doe, wheezing, orthopnea, PND, increased LE swelling, palpitations, dizziness or syncope.  Pt denies polydipsia, polyuria.  Pt denies new neurological symptoms such as new headache, or facial or extremity weakness or numbness   Past Medical History:  Diagnosis Date  . Actinic keratosis, hx of    chest wall (2012)  . Allergic rhinitis   . Anxiety   . Arthritis    "spine, hips" (01/30/2017)  . Bursitis   . Cellulitis of right foot 01/30/2017  . Colon polyps   . Diverticulosis   . Esophageal dysmotilities 10/16/2017  . Fibromyalgia    "qwhere" (01/30/2017)  . Gallstones   . GERD (gastroesophageal reflux disease)   . High cholesterol   . History of kidney stones   . History of stomach ulcers   . Hx of squamous cell carcinoma excision    right mandible (2011), right arm (2014)  . Hypertension   . IBS (irritable bowel syndrome)   . Osteoarthritis   . Pneumonia 2000s X 2   "twice" (01/30/2017)  . Pyloric stenosis   . Stroke [facility]) 10/2014   denies residual on 01/30/2017   Past Surgical History:  Procedure Laterality Date  . APPENDECTOMY    . CARDIOVASCULAR STRESS TEST  04/2016   nuclear cardiolite stress test done in [place] (no ischemia, no LVH, no LV systolic function)  . CATARACT EXTRACTION W/ INTRAOCULAR LENS  IMPLANT, BILATERAL Bilateral   . CHOLECYSTECTOMY OPEN    . COLONOSCOPY  2009, 2014  . DIAGNOSTIC MAMMOGRAM  2009, 2012  . TONSILLECTOMY      reports that  has never smoked. she has never used smokeless tobacco. She reports that she drinks alcohol. She reports that she does not use drugs. family history includes Heart attack in her father, maternal  grandfather, maternal grandmother, mother, paternal grandfather, and paternal grandmother; Stroke in her father, maternal grandfather, maternal grandmother, mother, paternal grandfather, and paternal grandmother. Allergies  Allergen Reactions  . Butrans [Buprenorphine] Other (See Comments)    Severe chest pains  . Codeine Other (See Comments)    Severe Chest pains  . Morphine And Related Other (See Comments)    Severe chest pains   Current Outpatient Medications on File Prior to Visit  Medication Sig Dispense Refill  . acetaminophen (TYLENOL) 650 MG CR tablet Take 650 mg by mouth every 8 (eight) hours as needed for pain.    Marland Kitchen ALPRAZolam (XANAX) 0.5 MG tablet Take 1 tablet (0.5 mg total) by mouth at bedtime as needed for anxiety. 30 tablet 2  . Ascorbic Acid (VITAMIN C) 250 MG CHEW Chew 250 mg by mouth 3 (three) times a week. 2 tab 3 time a week    . aspirin EC 325 MG tablet Take 325 mg by mouth daily.    Marland Kitchen atorvastatin (LIPITOR) 10 MG tablet Take 1 tablet (10 mg total) by mouth daily. 90 tablet 1  . dicyclomine (BENTYL) 10 MG capsule Take 10 mg by mouth 2 (two) times daily.     . diphenoxylate-atropine (LOMOTIL) 2.5-0.025 MG tablet Take 1 tablet by mouth 3 (three) times daily as needed for diarrhea or loose stools. 90 tablet 1  .  famotidine (PEPCID) 20 MG tablet Take 1 tablet (20 mg total) by mouth 2 (two) times daily. 60 tablet 6  . fexofenadine (ALLEGRA) 180 MG tablet Take 180 mg by mouth daily.    . hydrochlorothiazide (HYDRODIURIL) 12.5 MG tablet Take 1 tablet (12.5 mg total) by mouth daily. 90 tablet 1  . IRON PO Take 65 mg by mouth 3 (three) times a week.    . loperamide (IMODIUM) 2 MG capsule Take by mouth as needed for diarrhea or loose stools.    Marland Kitchen losartan (COZAAR) 100 MG tablet Take 1 tablet (100 mg total) daily by mouth. 90 tablet 3  . metoprolol succinate (TOPROL-XL) 25 MG 24 hr tablet Take 1 tablet (25 mg total) by mouth daily. 90 tablet 1  . Omega-3 1000 MG CAPS Take by  mouth.    . Vitamin D, Ergocalciferol, (DRISDOL) 50000 units CAPS capsule Take 1 capsule (50,000 Units total) by mouth every 7 (seven) days. 12 capsule 0   No current facility-administered medications on file prior to visit.    Review of Systems  Constitutional: Negative for other unusual diaphoresis or sweats HENT: Negative for ear discharge or swelling Eyes: Negative for other worsening visual disturbances Respiratory: Negative for stridor or other swelling  Gastrointestinal: Negative for worsening distension or other blood Genitourinary: Negative for retention or other urinary change Musculoskeletal: Negative for other MSK pain or swelling Skin: Negative for color change or other new lesions Neurological: Negative for worsening tremors and other numbness  Psychiatric/Behavioral: Negative for worsening agitation or other fatigue All other system neg per pt    Objective:   Physical Exam BP 128/86   Pulse 61   Temp 98.3 F (36.8 C) (Oral)   Ht 5\' 2"  (1.575 m)   Wt 114 lb (51.7 kg)   SpO2 96%   BMI 20.85 kg/m  VS noted, mild to mod ill Constitutional: Pt appears in NAD HENT: Head: NCAT.  Right Ear: External ear normal.  Left Ear: External ear normal.  Eyes: . Pupils are equal, round, and reactive to light. Conjunctivae and EOM are normal Nose: without d/c or deformity Neck: Neck supple. Gross normal ROM Cardiovascular: Normal rate and regular rhythm.   Pulmonary/Chest: Effort normal and breath sounds decreased without rales but with few scattered wheezing.  Neurological: Pt is alert. At baseline orientation, motor grossly intact Skin: Skin is warm. No rashes, other new lesions, no LE edema Psychiatric: Pt behavior is normal without agitation  No other exam findings    Assessment & Plan:

## 2018-01-19 NOTE — Assessment & Plan Note (Signed)
Mild to mod, for depomedrol 80 IM, predpac asd,  to f/u any worsening symptoms or concerns

## 2018-01-19 NOTE — Assessment & Plan Note (Signed)
Mild to mod, c/w bonchitis vs pna, for antibx course, cough med prn,  to f/u any worsening symptoms or concerns

## 2018-01-19 NOTE — Assessment & Plan Note (Signed)
stable overall by history and exam, recent data reviewed with pt, and pt to continue medical treatment as before,  to f/u any worsening symptoms or concerns BP Readings from Last 3 Encounters:  01/18/18 128/86  12/12/17 120/76  10/16/17 (!) 142/84

## 2018-01-29 ENCOUNTER — Other Ambulatory Visit: Payer: Self-pay | Admitting: Nurse Practitioner

## 2018-02-11 ENCOUNTER — Other Ambulatory Visit: Payer: Self-pay | Admitting: Family Medicine

## 2018-02-20 ENCOUNTER — Ambulatory Visit: Payer: Self-pay

## 2018-02-20 ENCOUNTER — Ambulatory Visit (INDEPENDENT_AMBULATORY_CARE_PROVIDER_SITE_OTHER): Payer: Medicare Other | Admitting: Family Medicine

## 2018-02-20 ENCOUNTER — Encounter: Payer: Self-pay | Admitting: Family Medicine

## 2018-02-20 VITALS — BP 130/72 | HR 56 | Ht 62.0 in | Wt 115.0 lb

## 2018-02-20 DIAGNOSIS — M7062 Trochanteric bursitis, left hip: Secondary | ICD-10-CM | POA: Diagnosis not present

## 2018-02-20 DIAGNOSIS — M25552 Pain in left hip: Principal | ICD-10-CM

## 2018-02-20 DIAGNOSIS — M7061 Trochanteric bursitis, right hip: Secondary | ICD-10-CM | POA: Diagnosis not present

## 2018-02-20 DIAGNOSIS — M25551 Pain in right hip: Secondary | ICD-10-CM | POA: Diagnosis not present

## 2018-02-20 NOTE — Progress Notes (Signed)
[name] [address], [place] [address] Phone: [phone] Subjective:      CC: Bilateral hip pain  SEG:[id number]  Susan [name] is a 84 y.o. female coming in with complaint of hip pain. The injections did alleviate her pain but she has had an increase in her pain since last visit.  Patient has not been seen for greater than 10 weeks.  Has responded well to the greater trochanteric injections previously.  Starting to have more pain that is waking her up at night.       Past Medical History:  Diagnosis Date  . Actinic keratosis, hx of    chest wall (2012)  . Allergic rhinitis   . Anxiety   . Arthritis    "spine, hips" (01/30/2017)  . Bursitis   . Cellulitis of right foot 01/30/2017  . Colon polyps   . Diverticulosis   . Esophageal dysmotilities 10/16/2017  . Fibromyalgia    "qwhere" (01/30/2017)  . Gallstones   . GERD (gastroesophageal reflux disease)   . High cholesterol   . History of kidney stones   . History of stomach ulcers   . Hx of squamous cell carcinoma excision    right mandible (2011), right arm (2014)  . Hypertension   . IBS (irritable bowel syndrome)   . Osteoarthritis   . Pneumonia 2000s X 2   "twice" (01/30/2017)  . Pyloric stenosis   . Stroke [facility]) 10/2014   denies residual on 01/30/2017   Past Surgical History:  Procedure Laterality Date  . APPENDECTOMY    . CARDIOVASCULAR STRESS TEST  04/2016   nuclear cardiolite stress test done in [place] (no ischemia, no LVH, no LV systolic function)  . CATARACT EXTRACTION W/ INTRAOCULAR LENS  IMPLANT, BILATERAL Bilateral   . CHOLECYSTECTOMY OPEN    . COLONOSCOPY  2009, 2014  . DIAGNOSTIC MAMMOGRAM  2009, 2012  . TONSILLECTOMY     Social History   Socioeconomic History  . Marital status: Widowed    Spouse name: None  . Number of children: None  . Years of education: None  . Highest education level: None  Social Needs  . Financial resource strain: None  .  Food insecurity - worry: None  . Food insecurity - inability: None  . Transportation needs - medical: None  . Transportation needs - non-medical: None  Occupational History  . None  Tobacco Use  . Smoking status: Never Smoker  . Smokeless tobacco: Never Used  Substance and Sexual Activity  . Alcohol use: Yes    Comment: glass of red wine maybe once week  . Drug use: No  . Sexual activity: None  Other Topics Concern  . None  Social History Narrative  . None   Allergies  Allergen Reactions  . Butrans [Buprenorphine] Other (See Comments)    Severe chest pains  . Codeine Other (See Comments)    Severe Chest pains  . Morphine And Related Other (See Comments)    Severe chest pains   Family History  Problem Relation Age of Onset  . Stroke Mother   . Heart attack Mother   . Stroke Father   . Heart attack Father   . Heart attack Maternal Grandmother   . Stroke Maternal Grandmother   . Stroke Paternal Grandmother   . Heart attack Paternal Grandmother   . Heart attack Maternal Grandfather   . Stroke Maternal Grandfather   . Stroke Paternal Grandfather   . Heart attack Paternal  Grandfather   . Colon cancer Neg Hx   . Pancreatic cancer Neg Hx   . Stomach cancer Neg Hx   . Esophageal cancer Neg Hx      Past medical history, social, surgical and family history all reviewed in electronic medical record.  No pertanent information unless stated regarding to the chief complaint.   Review of Systems:Review of systems updated and as accurate as of 02/20/18  No headache, visual changes, nausea, vomiting, diarrhea, constipation, dizziness, abdominal pain, skin rash, fevers, chills, night sweats, weight loss, swollen lymph nodes, body aches, joint swelling, chest pain, shortness of breath, mood changes.  Positive muscle aches  Objective  Blood pressure 130/72, pulse (!) 56, height 5\' 2"  (1.575 m), weight 115 lb (52.2 kg), SpO2 97 %. Systems examined below as of 02/20/18   General:  No apparent distress alert and oriented x3 mood and affect normal, dressed appropriately.  HEENT: Pupils equal, extraocular movements intact  Respiratory: Patient's speak in full sentences and does not appear short of breath  Cardiovascular: No lower extremity edema, non tender, no erythema  Skin: Warm dry intact with no signs of infection or rash on extremities or on axial skeleton.  Abdomen: Soft nontender  Neuro: Cranial nerves II through XII are intact, neurovascularly intact in all extremities with 2+ DTRs and 2+ pulses.  Lymph: No lymphadenopathy of posterior or anterior cervical chain or axillae bilaterally.  Gait antalgic gait walking with the aid of a walker MSK:  tender with limited range of motion and good stability and symmetric strength and tone of shoulders, elbows, wrist,and ankles bilaterally.  Bilateral hip pain, seems to be over the greater trochanteric area.  Arthritic changes of multiple joints including the hips with decreasing range of motion in all planes.  Negative straight leg test bilaterally     Impression and Recommendations:     This case required medical decision making of moderate complexity.      Note: This dictation was prepared with Dragon dictation along with smaller phrase technology. Any transcriptional errors that result from this process are unintentional.

## 2018-02-20 NOTE — Assessment & Plan Note (Signed)
Patient given injections.  Tolerated the procedure well.  Discussed icing regimen and home exercises.  Discussed which activities to doing which wants to avoid.  Discussed icing regimen.  Follow-up again in 10 weeks.

## 2018-02-20 NOTE — Patient Instructions (Addendum)
Good to see you  Susan [name] is your friend. Ice 20 minutes 2 times daily. Usually after activity and before bed. Injected both sides  See me again in 10 weeks

## 2018-03-01 ENCOUNTER — Other Ambulatory Visit: Payer: Self-pay | Admitting: Nurse Practitioner

## 2018-03-01 DIAGNOSIS — I10 Essential (primary) hypertension: Secondary | ICD-10-CM

## 2018-03-12 ENCOUNTER — Ambulatory Visit: Payer: Medicare Other | Admitting: Family Medicine

## 2018-03-14 ENCOUNTER — Other Ambulatory Visit: Payer: Self-pay | Admitting: Internal Medicine

## 2018-03-14 DIAGNOSIS — F418 Other specified anxiety disorders: Secondary | ICD-10-CM

## 2018-03-14 NOTE — Telephone Encounter (Signed)
02/11/2018 30# 

## 2018-03-14 NOTE — Telephone Encounter (Signed)
Done erx 

## 2018-04-12 ENCOUNTER — Other Ambulatory Visit: Payer: Self-pay | Admitting: Internal Medicine

## 2018-04-12 DIAGNOSIS — I10 Essential (primary) hypertension: Secondary | ICD-10-CM

## 2018-04-16 ENCOUNTER — Other Ambulatory Visit: Payer: Self-pay | Admitting: Internal Medicine

## 2018-04-16 DIAGNOSIS — I10 Essential (primary) hypertension: Secondary | ICD-10-CM

## 2018-04-17 ENCOUNTER — Ambulatory Visit: Payer: Medicare Other | Admitting: Internal Medicine

## 2018-04-20 ENCOUNTER — Other Ambulatory Visit: Payer: Self-pay | Admitting: Internal Medicine

## 2018-04-20 DIAGNOSIS — I10 Essential (primary) hypertension: Secondary | ICD-10-CM

## 2018-04-21 NOTE — Progress Notes (Addendum)
[name] [place], [place] [address] Phone: [phone] Subjective:     CC: Bilateral hip pain  ION:[id number]  Susan [name] is a 84 y.o. female coming in with complaint of bilateral hip pain.  Patient states the left side of her hip started having increasing pain.  Went from a sitting to standing position he has severe amount of pain.  This was associated with bruising that is still there.  Approximately 1 week ago.  Patient states that the right side of the hip is starting to worsen again.     Past Medical History:  Diagnosis Date  . Actinic keratosis, hx of    chest wall (2012)  . Allergic rhinitis   . Anxiety   . Arthritis    "spine, hips" (01/30/2017)  . Bursitis   . Cellulitis of right foot 01/30/2017  . Colon polyps   . Diverticulosis   . Esophageal dysmotilities 10/16/2017  . Fibromyalgia    "qwhere" (01/30/2017)  . Gallstones   . GERD (gastroesophageal reflux disease)   . High cholesterol   . History of kidney stones   . History of stomach ulcers   . Hx of squamous cell carcinoma excision    right mandible (2011), right arm (2014)  . Hypertension   . IBS (irritable bowel syndrome)   . Osteoarthritis   . Pneumonia 2000s X 2   "twice" (01/30/2017)  . Pyloric stenosis   . Stroke [facility]) 10/2014   denies residual on 01/30/2017   Past Surgical History:  Procedure Laterality Date  . APPENDECTOMY    . CARDIOVASCULAR STRESS TEST  04/2016   nuclear cardiolite stress test done in [place] (no ischemia, no LVH, no LV systolic function)  . CATARACT EXTRACTION W/ INTRAOCULAR LENS  IMPLANT, BILATERAL Bilateral   . CHOLECYSTECTOMY OPEN    . COLONOSCOPY  2009, 2014  . DIAGNOSTIC MAMMOGRAM  2009, 2012  . TONSILLECTOMY     Social History   Socioeconomic History  . Marital status: Widowed    Spouse name: Not on file  . Number of children: Not on file  . Years of education: Not on file  . Highest education level: Not on file    Occupational History  . Not on file  Social Needs  . Financial resource strain: Not on file  . Food insecurity:    Worry: Not on file    Inability: Not on file  . Transportation needs:    Medical: Not on file    Non-medical: Not on file  Tobacco Use  . Smoking status: Never Smoker  . Smokeless tobacco: Never Used  Substance and Sexual Activity  . Alcohol use: Yes    Comment: glass of red wine maybe once week  . Drug use: No  . Sexual activity: Not on file  Lifestyle  . Physical activity:    Days per week: Not on file    Minutes per session: Not on file  . Stress: Not on file  Relationships  . Social connections:    Talks on phone: Not on file    Gets together: Not on file    Attends religious service: Not on file    Active member of club or organization: Not on file    Attends meetings of clubs or organizations: Not on file    Relationship status: Not on file  Other Topics Concern  . Not on file  Social History Narrative  . Not on file   Allergies  Allergen Reactions  . Butrans [Buprenorphine] Other (See Comments)    Severe chest pains  . Codeine Other (See Comments)    Severe Chest pains  . Morphine And Related Other (See Comments)    Severe chest pains   Family History  Problem Relation Age of Onset  . Stroke Mother   . Heart attack Mother   . Stroke Father   . Heart attack Father   . Heart attack Maternal Grandmother   . Stroke Maternal Grandmother   . Stroke Paternal Grandmother   . Heart attack Paternal Grandmother   . Heart attack Maternal Grandfather   . Stroke Maternal Grandfather   . Stroke Paternal Grandfather   . Heart attack Paternal Grandfather   . Colon cancer Neg Hx   . Pancreatic cancer Neg Hx   . Stomach cancer Neg Hx   . Esophageal cancer Neg Hx      Past medical history, social, surgical and family history all reviewed in electronic medical record.  No pertanent information unless stated regarding to the chief complaint.    Review of Systems:Review of systems updated and as accurate as of 04/22/18  No headache, visual changes, nausea, vomiting, diarrhea, constipation, dizziness, abdominal pain, skin rash, fevers, chills, night sweats, weight loss, swollen lymph nodes, body aches, , chest pain, shortness of breath, mood changes.  Positive joint swelling and muscle aches  Objective  Blood pressure 140/82, pulse 66, height 5\' 2"  (1.575 m), weight 112 lb (50.8 kg), SpO2 97 %. Systems examined below as of 04/22/18   General: No apparent distress alert and oriented x3 mood and affect normal, dressed appropriately.  HEENT: Pupils equal, extraocular movements intact  Respiratory: Patient's speak in full sentences and does not appear short of breath  Cardiovascular: No lower extremity edema, non tender, no erythema  Skin: Warm dry intact with no signs of infection or rash on extremities or on axial skeleton.  Abdomen: Soft nontender  Neuro: Cranial nerves II through XII are intact, neurovascularly intact in all extremities with 2+ DTRs and 2+ pulses.  Lymph: No lymphadenopathy of posterior or anterior cervical chain or axillae bilaterally.  Gait antalgic gait MSK:  Non tender with full range of motion and good stability and symmetric strength and tone of shoulders, elbows, wrist, knee and ankles bilaterally.  Left hip shows significant bruising.  Tenderness to palpation over the lateral aspect mild positive Corky Sox  Patient is severely tender to palpation.  Limited range of motion in all planes. Right hip shows significant tenderness to palpation over the lateral aspect of the greater trochanteric area.   After verbal consent patient was prepped with alcohol swab and with a 21-gauge 2 inch needle patient was injected with 1 cc of 0.5% Marcaine and 1 cc of Kenalog 40 mg/mL into the right greater trochanteric area.  No blood loss.  After verbal consent patient was prepped with alcohol swabs and with a 21-gauge 2 inch  needle patient had an attempted aspiration of the fluctuant hematoma noted.  Unfortunately needle aspiration was  notsuccessful.  1 cc of 0.5% Marcaine used.  Postinjection instructions given    Impression and Recommendations:     This case required medical decision making of moderate complexity.      Note: This dictation was prepared with Dragon dictation along with smaller phrase technology. Any transcriptional errors that result from this process are unintentional.

## 2018-04-22 ENCOUNTER — Encounter: Payer: Self-pay | Admitting: Family Medicine

## 2018-04-22 ENCOUNTER — Ambulatory Visit (INDEPENDENT_AMBULATORY_CARE_PROVIDER_SITE_OTHER): Payer: Medicare Other | Admitting: Family Medicine

## 2018-04-22 DIAGNOSIS — M7062 Trochanteric bursitis, left hip: Secondary | ICD-10-CM | POA: Diagnosis not present

## 2018-04-22 DIAGNOSIS — S7002XA Contusion of left hip, initial encounter: Secondary | ICD-10-CM | POA: Insufficient documentation

## 2018-04-22 DIAGNOSIS — M7061 Trochanteric bursitis, right hip: Secondary | ICD-10-CM | POA: Diagnosis not present

## 2018-04-22 NOTE — Assessment & Plan Note (Signed)
Patient does have a large contusion of the left hip.  Patient will has been doing relatively well and is able to ambulate.  We discussed with patient at great length about what to avoid.  Patient had attempted aspiration today with no significant improvement.  We discussed with patient that if he continued doing the injections at this frequency we can may have more atrophy of the musculature surrounding the area.  Do not feel comfortable giving patient pain medications.  Patient wants relief of the pain so we do injections and patient understands the potential consequences of this.  Patient will follow-up with me again in 2 to 3 weeks to make sure that the bruising goes away

## 2018-04-22 NOTE — Patient Instructions (Signed)
Good to see you  Injected the hips again today  I hope it helps.  Try to stay active  pennsaid pinkie amount topically 2 times daily as needed.  See me again in 10 weeks

## 2018-04-30 ENCOUNTER — Other Ambulatory Visit: Payer: Self-pay | Admitting: Family Medicine

## 2018-04-30 ENCOUNTER — Other Ambulatory Visit: Payer: Self-pay | Admitting: Internal Medicine

## 2018-05-01 ENCOUNTER — Ambulatory Visit (INDEPENDENT_AMBULATORY_CARE_PROVIDER_SITE_OTHER): Payer: Medicare Other | Admitting: Internal Medicine

## 2018-05-01 ENCOUNTER — Other Ambulatory Visit (INDEPENDENT_AMBULATORY_CARE_PROVIDER_SITE_OTHER): Payer: Medicare Other

## 2018-05-01 ENCOUNTER — Encounter: Payer: Self-pay | Admitting: Internal Medicine

## 2018-05-01 VITALS — BP 114/72 | HR 83 | Temp 98.1°F | Ht 62.0 in | Wt 111.0 lb

## 2018-05-01 DIAGNOSIS — I10 Essential (primary) hypertension: Secondary | ICD-10-CM

## 2018-05-01 DIAGNOSIS — E782 Mixed hyperlipidemia: Secondary | ICD-10-CM

## 2018-05-01 DIAGNOSIS — I639 Cerebral infarction, unspecified: Secondary | ICD-10-CM

## 2018-05-01 DIAGNOSIS — R739 Hyperglycemia, unspecified: Secondary | ICD-10-CM | POA: Insufficient documentation

## 2018-05-01 LAB — LIPID PANEL
CHOL/HDL RATIO: 3
CHOLESTEROL: 227 mg/dL — AB (ref 0–200)
HDL: 80.1 mg/dL (ref >39.00)
NonHDL: 146.41
TRIGLYCERIDES: 213 mg/dL — AB (ref 0.0–149.0)
VLDL: 42.6 mg/dL — ABNORMAL HIGH (ref 0.0–40.0)

## 2018-05-01 LAB — BASIC METABOLIC PANEL
BUN: 22 mg/dL (ref 6–23)
CALCIUM: 9.7 mg/dL (ref 8.4–10.5)
CO2: 32 mEq/L (ref 19–32)
CREATININE: 0.64 mg/dL (ref 0.40–1.20)
Chloride: 101 mEq/L (ref 96–112)
GFR: 92.36 mL/min (ref >60.00)
Glucose, Bld: 127 mg/dL — ABNORMAL HIGH (ref 70–99)
Potassium: 3.5 mEq/L (ref 3.5–5.1)
Sodium: 142 mEq/L (ref 135–145)

## 2018-05-01 LAB — HEPATIC FUNCTION PANEL
ALBUMIN: 4.1 g/dL (ref 3.5–5.2)
ALK PHOS: 47 U/L (ref 39–117)
ALT: 15 U/L (ref 0–35)
AST: 18 U/L (ref 0–37)
Bilirubin, Direct: 0.1 mg/dL (ref 0.0–0.3)
TOTAL PROTEIN: 7.3 g/dL (ref 6.0–8.3)
Total Bilirubin: 0.5 mg/dL (ref 0.2–1.2)

## 2018-05-01 LAB — LDL CHOLESTEROL, DIRECT: LDL DIRECT: 102 mg/dL

## 2018-05-01 LAB — HEMOGLOBIN A1C: HEMOGLOBIN A1C: 5.6 % (ref 4.6–6.5)

## 2018-05-01 NOTE — Patient Instructions (Signed)

## 2018-05-01 NOTE — Progress Notes (Signed)
Subjective:    Patient ID: Susan [name], female    DOB: 03-25-1924, 84 y.o.   MRN: [id number]  HPI  Here with daughter to f/u general medical concernes,  overall doing ok,  Pt denies chest pain, increasing sob or doe, wheezing, orthopnea, PND, increased LE swelling, palpitations, dizziness or syncope.  Pt denies new neurological symptoms such as new headache, or facial or extremity weakness or numbness.  Pt denies polydipsia, polyuria, or low sugar episode.  Pt states overall good compliance with meds, mostly trying to follow appropriate diet, with wt overall stable, and remains fairly active for age.  No new complaints or interval hx except C/o left lateral thigh pain, had an appt with DrSmith last wk, being tx with topical after cortisone recent.   Past Medical History:  Diagnosis Date  . Actinic keratosis, hx of    chest wall (2012)  . Allergic rhinitis   . Anxiety   . Arthritis    "spine, hips" (01/30/2017)  . Bursitis   . Cellulitis of right foot 01/30/2017  . Colon polyps   . Diverticulosis   . Esophageal dysmotilities 10/16/2017  . Fibromyalgia    "qwhere" (01/30/2017)  . Gallstones   . GERD (gastroesophageal reflux disease)   . High cholesterol   . History of kidney stones   . History of stomach ulcers   . Hx of squamous cell carcinoma excision    right mandible (2011), right arm (2014)  . Hypertension   . IBS (irritable bowel syndrome)   . Osteoarthritis   . Pneumonia 2000s X 2   "twice" (01/30/2017)  . Pyloric stenosis   . Stroke [facility]) 10/2014   denies residual on 01/30/2017   Past Surgical History:  Procedure Laterality Date  . APPENDECTOMY    . CARDIOVASCULAR STRESS TEST  04/2016   nuclear cardiolite stress test done in [place] (no ischemia, no LVH, no LV systolic function)  . CATARACT EXTRACTION W/ INTRAOCULAR LENS  IMPLANT, BILATERAL Bilateral   . CHOLECYSTECTOMY OPEN    . COLONOSCOPY  2009, 2014  . DIAGNOSTIC MAMMOGRAM  2009, 2012  . TONSILLECTOMY      reports that she has never smoked. She has never used smokeless tobacco. She reports that she drinks alcohol. She reports that she does not use drugs. family history includes Heart attack in her father, maternal grandfather, maternal grandmother, mother, paternal grandfather, and paternal grandmother; Stroke in her father, maternal grandfather, maternal grandmother, mother, paternal grandfather, and paternal grandmother. Allergies  Allergen Reactions  . Butrans [Buprenorphine] Other (See Comments)    Severe chest pains  . Codeine Other (See Comments)    Severe Chest pains  . Morphine And Related Other (See Comments)    Severe chest pains   Current Outpatient Medications on File Prior to Visit  Medication Sig Dispense Refill  . acetaminophen (TYLENOL) 650 MG CR tablet Take 650 mg by mouth every 8 (eight) hours as needed for pain.    Marland Kitchen ALPRAZolam (XANAX) 0.5 MG tablet TAKE 1 TABLET BY MOUTH ONCE DAILY AT BEDTIME AS NEEDED FOR ANXIETY 30 tablet 2  . Ascorbic Acid (VITAMIN C) 250 MG CHEW Chew 250 mg by mouth 3 (three) times a week. 2 tab 3 time a week    . aspirin EC 325 MG tablet Take 325 mg by mouth daily.    Marland Kitchen atorvastatin (LIPITOR) 10 MG tablet TAKE 1 TABLET BY MOUTH ONCE DAILY 90 tablet 0  . dicyclomine (BENTYL) 10 MG capsule Take 10 mg by  mouth 2 (two) times daily.     . diphenoxylate-atropine (LOMOTIL) 2.5-0.025 MG tablet Take 1 tablet by mouth 3 (three) times daily as needed for diarrhea or loose stools. 90 tablet 1  . famotidine (PEPCID) 20 MG tablet Take 1 tablet (20 mg total) by mouth 2 (two) times daily. 60 tablet 6  . fexofenadine (ALLEGRA) 180 MG tablet Take 180 mg by mouth daily.    . hydrochlorothiazide (HYDRODIURIL) 12.5 MG tablet TAKE 1 TABLET BY MOUTH ONCE DAILY 90 tablet 0  . IRON PO Take 65 mg by mouth 3 (three) times a week.    . loperamide (IMODIUM) 2 MG capsule Take by mouth as needed for diarrhea or loose stools.    Marland Kitchen losartan (COZAAR) 100 MG tablet Take 1 tablet (100 mg  total) daily by mouth. 90 tablet 3  . metoprolol succinate (TOPROL-XL) 25 MG 24 hr tablet TAKE 1 TABLET BY MOUTH ONCE DAILY 90 tablet 0  . Omega-3 1000 MG CAPS Take by mouth.     No current facility-administered medications on file prior to visit.    Review of Systems  Constitutional: Negative for other unusual diaphoresis or sweats HENT: Negative for ear discharge or swelling Eyes: Negative for other worsening visual disturbances Respiratory: Negative for stridor or other swelling  Gastrointestinal: Negative for worsening distension or other blood Genitourinary: Negative for retention or other urinary change Musculoskeletal: Negative for other MSK pain or swelling Skin: Negative for color change or other new lesions Neurological: Negative for worsening tremors and other numbness  Psychiatric/Behavioral: Negative for worsening agitation or other fatigue All other system neg per pt    Objective:   Physical Exam BP 114/72   Pulse 83   Temp 98.1 F (36.7 C) (Oral)   Ht 5\' 2"  (1.575 m)   Wt 111 lb (50.3 kg)   SpO2 96%   BMI 20.30 kg/m  VS noted, frail but able to get up on exam table with small 1 person assist Constitutional: Pt appears in NAD HENT: Head: NCAT.  Right Ear: External ear normal.  Left Ear: External ear normal.  Eyes: . Pupils are equal, round, and reactive to light. Conjunctivae and EOM are normal Nose: without d/c or deformity Neck: Neck supple. Gross normal ROM Cardiovascular: Normal rate and regular rhythm.   Pulmonary/Chest: Effort normal and breath sounds without rales or wheezing.  Abd:  Soft, NT, ND, + BS, no organomegaly Neurological: Pt is alert. At baseline orientation, motor grossly intact Skin: Skin is warm. No rashes, other new lesions, no LE edema Psychiatric: Pt behavior is normal without agitation  No other exam findings    Assessment & Plan:

## 2018-05-01 NOTE — Assessment & Plan Note (Signed)
stable overall by history and exam, recent data reviewed with pt, and pt to continue medical treatment as before,  to f/u any worsening symptoms or concerns. Lab Results  Component Value Date   LDLCALC 104 (H) 10/16/2017

## 2018-05-01 NOTE — Assessment & Plan Note (Signed)
stable overall by history and exam, recent data reviewed with pt, and pt to continue medical treatment as before,  to f/u any worsening symptoms or concerns Lab Results  Component Value Date   HGBA1C 5.6 05/01/2018

## 2018-05-01 NOTE — Assessment & Plan Note (Signed)
stable overall by history and exam, recent data reviewed with pt, and pt to continue medical treatment as before,  to f/u any worsening symptoms or concerns BP Readings from Last 3 Encounters:  05/01/18 114/72  04/22/18 140/82  02/20/18 130/72

## 2018-05-01 NOTE — Assessment & Plan Note (Signed)
stable overall by history and exam, and pt to continue medical treatment as before,  to f/u any worsening symptoms or concerns 

## 2018-05-07 NOTE — Progress Notes (Signed)
[name] [address] [place],  [address] Phone: [phone] Subjective:    I'm seeing this patient by the request  of:    CC: Hip pain follow-up  [id number]  Susan [name] is a 84 y.o. female coming in with complaint of hip pain.  Patient fell previously and did have a large hematoma on the inside of the hip.  Attempted aspiration with no significant success.  Patient has been doing compression shorts, heat, and monitoring.  Patient since then making significant progress.  80% better.  Feels that the soccer compression shorts at night are helping.     Past Medical History:  Diagnosis Date  . Actinic keratosis, hx of    chest wall (2012)  . Allergic rhinitis   . Anxiety   . Arthritis    "spine, hips" (01/30/2017)  . Bursitis   . Cellulitis of right foot 01/30/2017  . Colon polyps   . Diverticulosis   . Esophageal dysmotilities 10/16/2017  . Fibromyalgia    "qwhere" (01/30/2017)  . Gallstones   . GERD (gastroesophageal reflux disease)   . High cholesterol   . History of kidney stones   . History of stomach ulcers   . Hx of squamous cell carcinoma excision    right mandible (2011), right arm (2014)  . Hypertension   . IBS (irritable bowel syndrome)   . Osteoarthritis   . Pneumonia 2000s X 2   "twice" (01/30/2017)  . Pyloric stenosis   . Stroke [facility]) 10/2014   denies residual on 01/30/2017   Past Surgical History:  Procedure Laterality Date  . APPENDECTOMY    . CARDIOVASCULAR STRESS TEST  04/2016   nuclear cardiolite stress test done in [place] (no ischemia, no LVH, no LV systolic function)  . CATARACT EXTRACTION W/ INTRAOCULAR LENS  IMPLANT, BILATERAL Bilateral   . CHOLECYSTECTOMY OPEN    . COLONOSCOPY  2009, 2014  . DIAGNOSTIC MAMMOGRAM  2009, 2012  . TONSILLECTOMY     Social History   Socioeconomic History  . Marital status: Widowed    Spouse name: Not on file  . Number of children: Not on file  . Years of  education: Not on file  . Highest education level: Not on file  Occupational History  . Not on file  Social Needs  . Financial resource strain: Not on file  . Food insecurity:    Worry: Not on file    Inability: Not on file  . Transportation needs:    Medical: Not on file    Non-medical: Not on file  Tobacco Use  . Smoking status: Never Smoker  . Smokeless tobacco: Never Used  Substance and Sexual Activity  . Alcohol use: Yes    Comment: glass of red wine maybe once week  . Drug use: No  . Sexual activity: Not on file  Lifestyle  . Physical activity:    Days per week: Not on file    Minutes per session: Not on file  . Stress: Not on file  Relationships  . Social connections:    Talks on phone: Not on file    Gets together: Not on file    Attends religious service: Not on file    Active member of club or organization: Not on file    Attends meetings of clubs or organizations: Not on file    Relationship status: Not on file  Other Topics Concern  . Not on file  Social History Narrative  .  Not on file   Allergies  Allergen Reactions  . Butrans [Buprenorphine] Other (See Comments)    Severe chest pains  . Codeine Other (See Comments)    Severe Chest pains  . Morphine And Related Other (See Comments)    Severe chest pains   Family History  Problem Relation Age of Onset  . Stroke Mother   . Heart attack Mother   . Stroke Father   . Heart attack Father   . Heart attack Maternal Grandmother   . Stroke Maternal Grandmother   . Stroke Paternal Grandmother   . Heart attack Paternal Grandmother   . Heart attack Maternal Grandfather   . Stroke Maternal Grandfather   . Stroke Paternal Grandfather   . Heart attack Paternal Grandfather   . Colon cancer Neg Hx   . Pancreatic cancer Neg Hx   . Stomach cancer Neg Hx   . Esophageal cancer Neg Hx      Past medical history, social, surgical and family history all reviewed in electronic medical record.  No pertanent  information unless stated regarding to the chief complaint.   Review of Systems:Review of systems updated and as accurate as of 05/08/18  No headache, visual changes, nausea, vomiting, diarrhea, constipation, dizziness, abdominal pain, skin rash, fevers, chills, night sweats, weight loss, swollen lymph nodes, body aches, joint swelling, chest pain, shortness of breath, mood changes.  Positive muscle aches  Objective  Blood pressure 130/90, pulse 61, height 5\' 2"  (1.575 m), weight 113 lb (51.3 kg), SpO2 97 %. Systems examined below as of 05/08/18   General: No apparent distress alert and oriented x3 mood and affect normal, dressed appropriately.  HEENT: Pupils equal, extraocular movements intact  Respiratory: Patient's speak in full sentences and does not appear short of breath  Cardiovascular: No lower extremity edema, non tender, no erythema  Skin: Warm dry intact with no signs of infection or rash on extremities or on axial skeleton.  Abdomen: Soft nontender  Neuro: Cranial nerves II through XII are intact, neurovascularly intact in all extremities with 2+ DTRs and 2+ pulses.  Lymph: No lymphadenopathy of posterior or anterior cervical chain or axillae bilaterally.  Gait antalgic MSK:  tender with mild limited range of motion and good stability and symmetric strength and tone of shoulders, elbows, wrist, knee and ankles bilaterally.  Right hip exam shows the patient does have some atrophy of the musculature.  Patient's hematoma significantly down in size by 80%.  Mild bruising still noted. Still tender to palpation in the area.      Impression and Recommendations:     This case required medical decision making of moderate complexity.      Note: This dictation was prepared with Dragon dictation along with smaller phrase technology. Any transcriptional errors that result from this process are unintentional.

## 2018-05-08 ENCOUNTER — Ambulatory Visit (INDEPENDENT_AMBULATORY_CARE_PROVIDER_SITE_OTHER): Payer: Medicare Other | Admitting: Family Medicine

## 2018-05-08 ENCOUNTER — Encounter: Payer: Self-pay | Admitting: Family Medicine

## 2018-05-08 DIAGNOSIS — S7002XD Contusion of left hip, subsequent encounter: Secondary | ICD-10-CM

## 2018-05-08 DIAGNOSIS — I639 Cerebral infarction, unspecified: Secondary | ICD-10-CM | POA: Diagnosis not present

## 2018-05-08 NOTE — Assessment & Plan Note (Signed)
Improvement at this time.  Continue conservative therapy.  Follow-up in 4 weeks

## 2018-05-08 NOTE — Patient Instructions (Signed)
Good to see you I so glad you are better  Stay active  Can see me again in 2 months if needed for the injections

## 2018-05-14 DIAGNOSIS — D225 Melanocytic nevi of trunk: Secondary | ICD-10-CM | POA: Diagnosis not present

## 2018-05-14 DIAGNOSIS — D1801 Hemangioma of skin and subcutaneous tissue: Secondary | ICD-10-CM | POA: Diagnosis not present

## 2018-05-14 DIAGNOSIS — L814 Other melanin hyperpigmentation: Secondary | ICD-10-CM | POA: Diagnosis not present

## 2018-05-14 DIAGNOSIS — L821 Other seborrheic keratosis: Secondary | ICD-10-CM | POA: Diagnosis not present

## 2018-05-14 DIAGNOSIS — Z85828 Personal history of other malignant neoplasm of skin: Secondary | ICD-10-CM | POA: Diagnosis not present

## 2018-06-10 ENCOUNTER — Other Ambulatory Visit: Payer: Self-pay | Admitting: Internal Medicine

## 2018-06-10 DIAGNOSIS — F418 Other specified anxiety disorders: Secondary | ICD-10-CM

## 2018-06-10 DIAGNOSIS — I10 Essential (primary) hypertension: Secondary | ICD-10-CM

## 2018-06-10 NOTE — Telephone Encounter (Signed)
Done erx 

## 2018-07-05 NOTE — Progress Notes (Signed)
[name] [address], [place] [address] Phone: [phone] Subjective:     CC: Bilateral hips  [id number]  Susan [name] is a 84 y.o. female coming in with complaint of bilateral hip pain. Patient feels like she gets 10 weeks of relief in both hips. Pain is starting to increase again.  Patient states that it is affecting daily activities, waking her up at night.  Makes it very difficult to sleep at night and has been sleeping in a recliner sometimes because of the pain.  States after sitting for a long amount of time and getting up pain seems to be worse as well.     Past Medical History:  Diagnosis Date  . Actinic keratosis, hx of    chest wall (2012)  . Allergic rhinitis   . Anxiety   . Arthritis    "spine, hips" (01/30/2017)  . Bursitis   . Cellulitis of right foot 01/30/2017  . Colon polyps   . Diverticulosis   . Esophageal dysmotilities 10/16/2017  . Fibromyalgia    "qwhere" (01/30/2017)  . Gallstones   . GERD (gastroesophageal reflux disease)   . High cholesterol   . History of kidney stones   . History of stomach ulcers   . Hx of squamous cell carcinoma excision    right mandible (2011), right arm (2014)  . Hypertension   . IBS (irritable bowel syndrome)   . Osteoarthritis   . Pneumonia 2000s X 2   "twice" (01/30/2017)  . Pyloric stenosis   . Stroke [facility]) 10/2014   denies residual on 01/30/2017   Past Surgical History:  Procedure Laterality Date  . APPENDECTOMY    . CARDIOVASCULAR STRESS TEST  04/2016   nuclear cardiolite stress test done in [place] (no ischemia, no LVH, no LV systolic function)  . CATARACT EXTRACTION W/ INTRAOCULAR LENS  IMPLANT, BILATERAL Bilateral   . CHOLECYSTECTOMY OPEN    . COLONOSCOPY  2009, 2014  . DIAGNOSTIC MAMMOGRAM  2009, 2012  . TONSILLECTOMY     Social History   Socioeconomic History  . Marital status: Widowed    Spouse name: Not on file  . Number of children: Not on file  .  Years of education: Not on file  . Highest education level: Not on file  Occupational History  . Not on file  Social Needs  . Financial resource strain: Not on file  . Food insecurity:    Worry: Not on file    Inability: Not on file  . Transportation needs:    Medical: Not on file    Non-medical: Not on file  Tobacco Use  . Smoking status: Never Smoker  . Smokeless tobacco: Never Used  Substance and Sexual Activity  . Alcohol use: Yes    Comment: glass of red wine maybe once week  . Drug use: No  . Sexual activity: Not on file  Lifestyle  . Physical activity:    Days per week: Not on file    Minutes per session: Not on file  . Stress: Not on file  Relationships  . Social connections:    Talks on phone: Not on file    Gets together: Not on file    Attends religious service: Not on file    Active member of club or organization: Not on file    Attends meetings of clubs or organizations: Not on file    Relationship status: Not on file  Other Topics Concern  . Not  on file  Social History Narrative  . Not on file   Allergies  Allergen Reactions  . Butrans [Buprenorphine] Other (See Comments)    Severe chest pains  . Codeine Other (See Comments)    Severe Chest pains  . Morphine And Related Other (See Comments)    Severe chest pains   Family History  Problem Relation Age of Onset  . Stroke Mother   . Heart attack Mother   . Stroke Father   . Heart attack Father   . Heart attack Maternal Grandmother   . Stroke Maternal Grandmother   . Stroke Paternal Grandmother   . Heart attack Paternal Grandmother   . Heart attack Maternal Grandfather   . Stroke Maternal Grandfather   . Stroke Paternal Grandfather   . Heart attack Paternal Grandfather   . Colon cancer Neg Hx   . Pancreatic cancer Neg Hx   . Stomach cancer Neg Hx   . Esophageal cancer Neg Hx      Past medical history, social, surgical and family history all reviewed in electronic medical record.  No  pertanent information unless stated regarding to the chief complaint.   Review of Systems:Review of systems updated and as accurate as of 07/08/18  No headache, visual changes, nausea, vomiting, diarrhea, constipation, dizziness, abdominal pain, skin rash, fevers, chills, night sweats, weight loss, swollen lymph nodes, body aches, joint swelling,  chest pain, shortness of breath, mood changes.  Positive muscle aches  Objective  Blood pressure 140/80, pulse 79, height 5\' 2"  (1.575 m), weight 113 lb (51.3 kg), SpO2 97 %. Systems examined below as of 07/08/18   General: No apparent distress alert and oriented x3 mood and affect normal, dressed appropriately.  HEENT: Pupils equal, extraocular movements intact  Respiratory: Patient's speak in full sentences and does not appear short of breath  Cardiovascular: Trace lower extremity edema, non tender, no erythema  Skin: Warm dry intact with no signs of infection or rash on extremities or on axial skeleton.  Abdomen: Soft nontender  Neuro: Cranial nerves II through XII are intact, neurovascularly intact in all extremities with 2+ DTRs and 2+ pulses.  Lymph: No lymphadenopathy of posterior or anterior cervical chain or axillae bilaterally.  Gait antalgic MSK:  tender with limited range of motion ity and symmetric strength and tone of shoulders, elbows, wrist,  knee and ankles bilaterally.  Hip Exam bilaterally in the setting of loss of range of motion.  Patient has atrophy of the lateral aspects of the hip soft tissue.  Severely tender to palpation over the greater trochanteric area bilaterally.   Procedure: Real-time Ultrasound Guided Injection of right greater trochanteric bursitis secondary to patient's body habitus Device: GE Logiq Q7 Ultrasound guided injection is preferred based studies that show increased duration, increased effect, greater accuracy, decreased procedural pain, increased response rate, and decreased cost with ultrasound guided  versus blind injection.  Verbal informed consent obtained.  Time-out conducted.  Noted no overlying erythema, induration, or other signs of local infection.  Skin prepped in a sterile fashion.  Local anesthesia: Topical Ethyl chloride.  With sterile technique and under real time ultrasound guidance:  Greater trochanteric area was visualized and patient's bursa was noted. A 22-gauge 3 inch needle was inserted and 4 cc of 0.5% Marcaine and 1 cc of Kenalog 40 mg/dL was injected. Pictures taken Completed without difficulty  Pain immediately resolved suggesting accurate placement of the medication.  Advised to call if fevers/chills, erythema, induration, drainage, or persistent bleeding.  Images  permanently stored and available for review in the ultrasound unit.  Impression: Technically successful ultrasound guided injection.   Procedure: Real-time Ultrasound Guided Injection of left  greater trochanteric bursitis secondary to patient's body habitus Device: GE Logiq Q7  Ultrasound guided injection is preferred based studies that show increased duration, increased effect, greater accuracy, decreased procedural pain, increased response rate, and decreased cost with ultrasound guided versus blind injection.  Verbal informed consent obtained.  Time-out conducted.  Noted no overlying erythema, induration, or other signs of local infection.  Skin prepped in a sterile fashion.  Local anesthesia: Topical Ethyl chloride.  With sterile technique and under real time ultrasound guidance:  Greater trochanteric area was visualized and patient's bursa was noted. A 22-gauge 3 inch needle was inserted and 4 cc of 0.5% Marcaine and 1 cc of Kenalog 40 mg/dL was injected. Pictures taken Completed without difficulty  Pain immediately resolved suggesting accurate placement of the medication.  Advised to call if fevers/chills, erythema, induration, drainage, or persistent bleeding.  Images permanently stored and  available for review in the ultrasound unit.  Impression: Technically successful ultrasound guided injection.    Impression and Recommendations:     This case required medical decision making of moderate complexity.      Note: This dictation was prepared with Dragon dictation along with smaller phrase technology. Any transcriptional errors that result from this process are unintentional.

## 2018-07-08 ENCOUNTER — Ambulatory Visit (INDEPENDENT_AMBULATORY_CARE_PROVIDER_SITE_OTHER): Payer: Medicare Other | Admitting: Family Medicine

## 2018-07-08 ENCOUNTER — Ambulatory Visit: Payer: Self-pay

## 2018-07-08 VITALS — BP 140/80 | HR 79 | Ht 62.0 in | Wt 113.0 lb

## 2018-07-08 DIAGNOSIS — M25552 Pain in left hip: Principal | ICD-10-CM

## 2018-07-08 DIAGNOSIS — M7061 Trochanteric bursitis, right hip: Secondary | ICD-10-CM | POA: Diagnosis not present

## 2018-07-08 DIAGNOSIS — M25551 Pain in right hip: Secondary | ICD-10-CM | POA: Diagnosis not present

## 2018-07-08 DIAGNOSIS — I639 Cerebral infarction, unspecified: Secondary | ICD-10-CM | POA: Diagnosis not present

## 2018-07-08 DIAGNOSIS — M7062 Trochanteric bursitis, left hip: Secondary | ICD-10-CM | POA: Diagnosis not present

## 2018-07-08 NOTE — Patient Instructions (Signed)
Good to see you  Ice is your friend See me again in 2 months!

## 2018-07-08 NOTE — Assessment & Plan Note (Signed)
Worsening pain.  Bilateral injections given again today.  Discussed with patient possibly some of the atrophy could be secondary to the injections but patient states that this is the only way that would allow her to continue to be active.  Patient will continue this at this point.  Discussed icing regimen, home exercise, which activities to do which wants to avoid.

## 2018-07-31 DIAGNOSIS — H43813 Vitreous degeneration, bilateral: Secondary | ICD-10-CM | POA: Diagnosis not present

## 2018-07-31 DIAGNOSIS — H52203 Unspecified astigmatism, bilateral: Secondary | ICD-10-CM | POA: Diagnosis not present

## 2018-07-31 DIAGNOSIS — H04123 Dry eye syndrome of bilateral lacrimal glands: Secondary | ICD-10-CM | POA: Diagnosis not present

## 2018-07-31 DIAGNOSIS — Z961 Presence of intraocular lens: Secondary | ICD-10-CM | POA: Diagnosis not present

## 2018-08-08 ENCOUNTER — Other Ambulatory Visit: Payer: Self-pay | Admitting: Internal Medicine

## 2018-08-09 ENCOUNTER — Other Ambulatory Visit: Payer: Self-pay | Admitting: Family Medicine

## 2018-08-09 ENCOUNTER — Other Ambulatory Visit: Payer: Self-pay

## 2018-09-06 ENCOUNTER — Other Ambulatory Visit: Payer: Self-pay | Admitting: Internal Medicine

## 2018-09-06 DIAGNOSIS — F418 Other specified anxiety disorders: Secondary | ICD-10-CM

## 2018-09-06 NOTE — Telephone Encounter (Signed)
   LOV: 05/01/18 NextOV: 11/06/18 Last Filled/Quantity: 08/08/18 30#

## 2018-09-09 ENCOUNTER — Ambulatory Visit: Payer: Medicare Other | Admitting: Family Medicine

## 2018-09-09 NOTE — Progress Notes (Signed)
[name] [address] [place], [place] [address] Phone: [phone] Subjective:   Susan [name], [name]   CC: Bilateral hip pain  [id number]  Susan [name] is a 84 y.o. female coming in with complaint of bilateral hip pain. Pain is constant and is worse with sit to stand and walking. Pain over GT.  Patient has known greater trochanteric bursitis bilaterally.  Has responded well to injections every 2 to 3 months.  Worsening symptoms at this time.  Continues to use a walker.  Patient feels that the pain is starting to affect her daily activities as well as at night.       Past Medical History:  Diagnosis Date  . Actinic keratosis, hx of    chest wall (2012)  . Allergic rhinitis   . Anxiety   . Arthritis    "spine, hips" (01/30/2017)  . Bursitis   . Cellulitis of right foot 01/30/2017  . Colon polyps   . Diverticulosis   . Esophageal dysmotilities 10/16/2017  . Fibromyalgia    "qwhere" (01/30/2017)  . Gallstones   . GERD (gastroesophageal reflux disease)   . High cholesterol   . History of kidney stones   . History of stomach ulcers   . Hx of squamous cell carcinoma excision    right mandible (2011), right arm (2014)  . Hypertension   . IBS (irritable bowel syndrome)   . Osteoarthritis   . Pneumonia 2000s X 2   "twice" (01/30/2017)  . Pyloric stenosis   . Stroke [facility]) 10/2014   denies residual on 01/30/2017   Past Surgical History:  Procedure Laterality Date  . APPENDECTOMY    . CARDIOVASCULAR STRESS TEST  04/2016   nuclear cardiolite stress test done in [place] ([name] ischemia, [name] LVH, [name] LV systolic function)  . CATARACT EXTRACTION W/ INTRAOCULAR LENS  IMPLANT, BILATERAL Bilateral   . CHOLECYSTECTOMY OPEN    . COLONOSCOPY  2009, 2014  . DIAGNOSTIC MAMMOGRAM  2009, 2012  . TONSILLECTOMY     Social History   Socioeconomic History  . Marital status: Widowed    Spouse name: Not on file    . Number of children: Not on file  . Years of education: Not on file  . Highest education level: Not on file  Occupational History  . Not on file  Social Needs  . Financial resource strain: Not on file  . Food insecurity:    Worry: Not on file    Inability: Not on file  . Transportation needs:    Medical: Not on file    Non-medical: Not on file  Tobacco Use  . Smoking status: Never Smoker  . Smokeless tobacco: Never Used  Substance and Sexual Activity  . Alcohol use: Yes    Comment: glass of red wine maybe once week  . Drug use: [name]  . Sexual activity: Not on file  Lifestyle  . Physical activity:    Days per week: Not on file    Minutes per session: Not on file  . Stress: Not on file  Relationships  . Social connections:    Talks on phone: Not on file    Gets together: Not on file    Attends religious service: Not on file    Active member of club or organization: Not on file    Attends meetings of clubs or organizations: Not on file    Relationship status: Not on file  Other Topics Concern  . Not on file  Social History Narrative  . Not on file   Allergies  Allergen Reactions  . Butrans [Buprenorphine] Other (See Comments)    Severe chest pains  . Codeine Other (See Comments)    Severe Chest pains  . Morphine And Related Other (See Comments)    Severe chest pains   Family History  Problem Relation Age of Onset  . Stroke Mother   . Heart attack Mother   . Stroke Father   . Heart attack Father   . Heart attack Maternal Grandmother   . Stroke Maternal Grandmother   . Stroke Paternal Grandmother   . Heart attack Paternal Grandmother   . Heart attack Maternal Grandfather   . Stroke Maternal Grandfather   . Stroke Paternal Grandfather   . Heart attack Paternal Grandfather   . Colon cancer Neg Hx   . Pancreatic cancer Neg Hx   . Stomach cancer Neg Hx   . Esophageal cancer Neg Hx      Current Outpatient Medications (Cardiovascular):  .  atorvastatin  (LIPITOR) 10 MG tablet, TAKE 1 TABLET BY MOUTH ONCE DAILY .  hydrochlorothiazide (HYDRODIURIL) 12.5 MG tablet, TAKE 1 TABLET BY MOUTH ONCE DAILY .  losartan (COZAAR) 100 MG tablet, Take 1 tablet (100 mg total) daily by mouth. .  metoprolol succinate (TOPROL-XL) 25 MG 24 hr tablet, TAKE 1 TABLET BY MOUTH ONCE DAILY  Current Outpatient Medications (Respiratory):  .  fexofenadine (ALLEGRA) 180 MG tablet, Take 180 mg by mouth daily.  Current Outpatient Medications (Analgesics):  .  acetaminophen (TYLENOL) 650 MG CR tablet, Take 650 mg by mouth every 8 (eight) hours as needed for pain. Marland Kitchen  aspirin EC 325 MG tablet, Take 325 mg by mouth daily.  Current Outpatient Medications (Hematological):  Marland Kitchen  IRON PO, Take 65 mg by mouth 3 (three) times a week.  Current Outpatient Medications (Other):  Marland Kitchen  ALPRAZolam (XANAX) 0.5 MG tablet, TAKE 1 TABLET BY MOUTH ONCE DAILY AT BEDTIME AS NEEDED FOR ANXIETY .  Ascorbic Acid (VITAMIN C) 250 MG CHEW, Chew 250 mg by mouth 3 (three) times a week. 2 tab 3 time a week .  dicyclomine (BENTYL) 10 MG capsule, Take 10 mg by mouth 2 (two) times daily.  .  diphenoxylate-atropine (LOMOTIL) 2.5-0.025 MG tablet, Take 1 tablet by mouth 3 (three) times daily as needed for diarrhea or loose stools. .  famotidine (PEPCID) 20 MG tablet, TAKE 1 TABLET BY MOUTH TWICE DAILY .  loperamide (IMODIUM) 2 MG capsule, Take by mouth as needed for diarrhea or loose stools. .  Omega-3 1000 MG CAPS, Take by mouth. .  Vitamin D, Ergocalciferol, (DRISDOL) 50000 units CAPS capsule, TAKE 1 CAPSULE BY MOUTH ONCE A WEEK    Past medical history, social, surgical and family history all reviewed in electronic medical record.  [name] pertanent information unless stated regarding to the chief complaint.   Review of Systems:  [name] headache, visual changes, nausea, vomiting, diarrhea, constipation, dizziness, abdominal pain, skin rash, fevers, chills, night sweats, weight loss, swollen lymph nodes, joint  swelling, , chest pain, shortness of breath, mood changes.  Positive muscle aches  Objective  Blood pressure (!) 142/96, pulse 66, height 5\' 2"  (1.575 m), weight 112 lb (50.8 kg), SpO2 97 %.   General: [name] apparent distress alert and oriented x3 mood and affect normal, dressed appropriately.  Mild increase in kyphosis of the upper back HEENT: Pupils equal, extraocular movements intact  Respiratory: Patient's  speak in full sentences and does not appear short of breath  Cardiovascular: Trace lower extremity edema, non tender, [name] erythema  Skin: Warm dry intact with [name] signs of infection or rash on extremities or on axial skeleton.  Abdomen: Soft nontender  Neuro: Cranial nerves II through XII are intact, neurovascularly intact in all extremities with 2+ DTRs and 2+ pulses.  Lymph: [name] lymphadenopathy of posterior or anterior cervical chain or axillae bilaterally.  Gait antalgic use the aid of a walker MSK:  tender with limited range of motion and good stability and symmetric strength and tone of shoulders, elbows, wrist, knee and ankles bilaterally.   Bilateral hip exam shows the patient has decreased range of motion in all planes.  Patient does have severe tenderness to palpation of the greater trochanteric area bilaterally.  Negative straight leg test.  Patient does have atrophy of the musculature on the lateral aspect of the hips bilaterally   Procedure: Real-time Ultrasound Guided Injection of right greater trochanteric bursitis secondary to patient's body habitus Device: GE Logiq Q7 Ultrasound guided injection is preferred based studies that show increased duration, increased effect, greater accuracy, decreased procedural pain, increased response rate, and decreased cost with ultrasound guided versus blind injection.  Verbal informed consent obtained.  Time-out conducted.  Noted [name] overlying erythema, induration, or other signs of local infection.  Skin prepped in a sterile fashion.  Local  anesthesia: Topical Ethyl chloride.  With sterile technique and under real time ultrasound guidance:  Greater trochanteric area was visualized and patient's bursa was noted. A 22-gauge 3 inch needle was inserted and 4 cc of 0.5% Marcaine and 1 cc of Kenalog 40 mg/dL was injected. Pictures taken Completed without difficulty  Pain immediately resolved suggesting accurate placement of the medication.  Advised to call if fevers/chills, erythema, induration, drainage, or persistent bleeding.  Images permanently stored and available for review in the ultrasound unit.  Impression: Technically successful ultrasound guided injection.   Procedure: Real-time Ultrasound Guided Injection of left  greater trochanteric bursitis secondary to patient's body habitus Device: GE Logiq Q7  Ultrasound guided injection is preferred based studies that show increased duration, increased effect, greater accuracy, decreased procedural pain, increased response rate, and decreased cost with ultrasound guided versus blind injection.  Verbal informed consent obtained.  Time-out conducted.  Noted [name] overlying erythema, induration, or other signs of local infection.  Skin prepped in a sterile fashion.  Local anesthesia: Topical Ethyl chloride.  With sterile technique and under real time ultrasound guidance:  Greater trochanteric area was visualized and patient's bursa was noted. A 22-gauge 3 inch needle was inserted and 4 cc of 0.5% Marcaine and 1 cc of Kenalog 40 mg/dL was injected. Pictures taken Completed without difficulty  Pain immediately resolved suggesting accurate placement of the medication.  Advised to call if fevers/chills, erythema, induration, drainage, or persistent bleeding.  Images permanently stored and available for review in the ultrasound unit.  Impression: Technically successful ultrasound guided injection.   Impression and Recommendations:     This case required medical decision making of moderate  complexity. The above documentation has been reviewed and is accurate and complete Lyndal Pulley, DO       Note: This dictation was prepared with Dragon dictation along with smaller phrase technology. Any transcriptional errors that result from this process are unintentional.

## 2018-09-11 ENCOUNTER — Ambulatory Visit: Payer: Self-pay

## 2018-09-11 ENCOUNTER — Ambulatory Visit (INDEPENDENT_AMBULATORY_CARE_PROVIDER_SITE_OTHER): Payer: Medicare Other | Admitting: Family Medicine

## 2018-09-11 ENCOUNTER — Encounter: Payer: Self-pay | Admitting: Family Medicine

## 2018-09-11 VITALS — BP 142/96 | HR 66 | Ht 62.0 in | Wt 112.0 lb

## 2018-09-11 DIAGNOSIS — I639 Cerebral infarction, unspecified: Secondary | ICD-10-CM | POA: Diagnosis not present

## 2018-09-11 DIAGNOSIS — M25552 Pain in left hip: Secondary | ICD-10-CM

## 2018-09-11 DIAGNOSIS — M7061 Trochanteric bursitis, right hip: Secondary | ICD-10-CM

## 2018-09-11 DIAGNOSIS — M7062 Trochanteric bursitis, left hip: Secondary | ICD-10-CM

## 2018-09-11 DIAGNOSIS — Z23 Encounter for immunization: Secondary | ICD-10-CM | POA: Diagnosis not present

## 2018-09-11 DIAGNOSIS — M25551 Pain in right hip: Secondary | ICD-10-CM | POA: Diagnosis not present

## 2018-09-11 NOTE — Patient Instructions (Signed)
Great to see you  Susan [name] is always your friend Try to pad the side of the hips when laying down  Injected the hips again today as well  Gave you flu shot.  See your primary care for high blood pressure if it continues.  See me again in 8 weeks

## 2018-09-11 NOTE — Assessment & Plan Note (Signed)
Bilateral injections given today.  Warned patient to look for any type of signs of any infectious etiology.  Patient does have atrophy of the sides of the hip secondary to the chronic steroids but it seems to be the only thing that does give patient relief.  Discussed further work-up with patient about potentially back which patient feels very adamant that it is coming from the lateral aspect of her hips.  Patient will continue with the same regimen at this moment.  Follow-up with me again in 2 to 3 months.  Spent  25 minutes with patient face-to-face and had greater than 50% of counseling including as described above in assessment and plan.

## 2018-10-09 ENCOUNTER — Other Ambulatory Visit: Payer: Self-pay | Admitting: Internal Medicine

## 2018-10-28 ENCOUNTER — Other Ambulatory Visit: Payer: Self-pay | Admitting: Internal Medicine

## 2018-11-05 NOTE — Progress Notes (Signed)
[name] [address], [place] [address] Phone: [phone] Subjective:    I'm seeing this patient by the request  of:    CC: Bilateral hip pain  [id number]  Susan [name] is a 84 y.o. female coming in with complaint of bilateral hip pain. States that her hips are doing pretty good. Injections last visit helped more this time than any time before. Has recurrent bursitis as well as a ganglion cyst on the lateral aspect of the hip.  States started waking her up at night again.  Tries to stay active.  Continues to use the walker to help her with locomotion.    Past Medical History:  Diagnosis Date  . Actinic keratosis, hx of    chest wall (2012)  . Allergic rhinitis   . Anxiety   . Arthritis    "spine, hips" (01/30/2017)  . Bursitis   . Cellulitis of right foot 01/30/2017  . Colon polyps   . Diverticulosis   . Esophageal dysmotilities 10/16/2017  . Fibromyalgia    "qwhere" (01/30/2017)  . Gallstones   . GERD (gastroesophageal reflux disease)   . High cholesterol   . History of kidney stones   . History of stomach ulcers   . Hx of squamous cell carcinoma excision    right mandible (2011), right arm (2014)  . Hypertension   . IBS (irritable bowel syndrome)   . Osteoarthritis   . Pneumonia 2000s X 2   "twice" (01/30/2017)  . Pyloric stenosis   . Stroke [facility]) 10/2014   denies residual on 01/30/2017   Past Surgical History:  Procedure Laterality Date  . APPENDECTOMY    . CARDIOVASCULAR STRESS TEST  04/2016   nuclear cardiolite stress test done in [place] (no ischemia, no LVH, no LV systolic function)  . CATARACT EXTRACTION W/ INTRAOCULAR LENS  IMPLANT, BILATERAL Bilateral   . CHOLECYSTECTOMY OPEN    . COLONOSCOPY  2009, 2014  . DIAGNOSTIC MAMMOGRAM  2009, 2012  . TONSILLECTOMY     Social History   Socioeconomic History  . Marital status: Widowed    Spouse name: Not on file  . Number of children: Not on file  . Years of  education: Not on file  . Highest education level: Not on file  Occupational History  . Not on file  Social Needs  . Financial resource strain: Not on file  . Food insecurity:    Worry: Not on file    Inability: Not on file  . Transportation needs:    Medical: Not on file    Non-medical: Not on file  Tobacco Use  . Smoking status: Never Smoker  . Smokeless tobacco: Never Used  Substance and Sexual Activity  . Alcohol use: Yes    Comment: glass of red wine maybe once week  . Drug use: No  . Sexual activity: Not on file  Lifestyle  . Physical activity:    Days per week: Not on file    Minutes per session: Not on file  . Stress: Not on file  Relationships  . Social connections:    Talks on phone: Not on file    Gets together: Not on file    Attends religious service: Not on file    Active member of club or organization: Not on file    Attends meetings of clubs or organizations: Not on file    Relationship status: Not on file  Other Topics Concern  . Not on file  Social History Narrative  . Not on file   Allergies  Allergen Reactions  . Butrans [Buprenorphine] Other (See Comments)    Severe chest pains  . Codeine Other (See Comments)    Severe Chest pains  . Morphine And Related Other (See Comments)    Severe chest pains   Family History  Problem Relation Age of Onset  . Stroke Mother   . Heart attack Mother   . Stroke Father   . Heart attack Father   . Heart attack Maternal Grandmother   . Stroke Maternal Grandmother   . Stroke Paternal Grandmother   . Heart attack Paternal Grandmother   . Heart attack Maternal Grandfather   . Stroke Maternal Grandfather   . Stroke Paternal Grandfather   . Heart attack Paternal Grandfather   . Colon cancer Neg Hx   . Pancreatic cancer Neg Hx   . Stomach cancer Neg Hx   . Esophageal cancer Neg Hx      Current Outpatient Medications (Cardiovascular):  .  atorvastatin (LIPITOR) 10 MG tablet, TAKE 1 TABLET BY MOUTH ONCE  DAILY .  hydrochlorothiazide (HYDRODIURIL) 12.5 MG tablet, TAKE 1 TABLET BY MOUTH ONCE DAILY .  losartan (COZAAR) 100 MG tablet, TAKE 1 TABLET BY MOUTH ONCE DAILY .  metoprolol succinate (TOPROL-XL) 25 MG 24 hr tablet, TAKE 1 TABLET BY MOUTH ONCE DAILY  Current Outpatient Medications (Respiratory):  .  fexofenadine (ALLEGRA) 180 MG tablet, Take 180 mg by mouth daily.  Current Outpatient Medications (Analgesics):  .  acetaminophen (TYLENOL) 650 MG CR tablet, Take 650 mg by mouth every 8 (eight) hours as needed for pain. Marland Kitchen  aspirin EC 325 MG tablet, Take 325 mg by mouth daily.  Current Outpatient Medications (Hematological):  Marland Kitchen  IRON PO, Take 65 mg by mouth 3 (three) times a week.  Current Outpatient Medications (Other):  Marland Kitchen  ALPRAZolam (XANAX) 0.5 MG tablet, TAKE 1 TABLET BY MOUTH ONCE DAILY AT BEDTIME AS NEEDED FOR ANXIETY .  Ascorbic Acid (VITAMIN C) 250 MG CHEW, Chew 250 mg by mouth 3 (three) times a week. 2 tab 3 time a week .  dicyclomine (BENTYL) 10 MG capsule, Take 10 mg by mouth 2 (two) times daily.  .  diphenoxylate-atropine (LOMOTIL) 2.5-0.025 MG tablet, Take 1 tablet by mouth 3 (three) times daily as needed for diarrhea or loose stools. .  famotidine (PEPCID) 20 MG tablet, TAKE 1 TABLET BY MOUTH TWICE DAILY .  loperamide (IMODIUM) 2 MG capsule, Take by mouth as needed for diarrhea or loose stools. .  Omega-3 1000 MG CAPS, Take by mouth. .  Vitamin D, Ergocalciferol, (DRISDOL) 50000 units CAPS capsule, TAKE 1 CAPSULE BY MOUTH ONCE A WEEK    Past medical history, social, surgical and family history all reviewed in electronic medical record.  No pertanent information unless stated regarding to the chief complaint.   Review of Systems:  No headache, visual changes, nausea, vomiting, diarrhea, constipation, dizziness, abdominal pain, skin rash, fevers, chills, night sweats, weight loss, swollen lymph nodes, body aches, joint swelling, muscle aches, chest pain, shortness of breath,  mood changes.  Positive muscle aches Objective  Blood pressure (!) 162/84, pulse 70, height 5\' 2"  (1.575 m), weight 110 lb (49.9 kg), SpO2 97 %.    General: No apparent distress alert and oriented x3 mood and affect normal, dressed appropriately.  HEENT: Pupils equal, extraocular movements intact  Respiratory: Patient's speak in full sentences and does not appear short of breath  Cardiovascular: No lower extremity  edema, non tender, no erythema  Skin: Warm dry intact with no signs of infection or rash on extremities or on axial skeleton.  Abdomen: Soft nontender  Neuro: Cranial nerves II through XII are intact, neurovascularly intact in all extremities with 2+ DTRs and 2+ pulses.  Lymph: No lymphadenopathy of posterior or anterior cervical chain or axillae bilaterally.  Gait antalgic MSK:  tender with mid range of motion and stability and symmetric 4 out of 5 strength and tone of shoulders, elbows, wrist, hip, knee and ankles bilaterally.   Severe tenderness to palpation over the greater trochanteric area bilaterally.  Negative straight leg test.  Patient does have atrophy of the musculature on the lateral aspect of the hip as well as the gluteal tendons.  Healing ulcer noted on the right hip.   Procedure: Real-time Ultrasound Guided Injection of right greater trochanteric bursitis secondary to patient's body habitus Device: GE Logiq Q7 Ultrasound guided injection is preferred based studies that show increased duration, increased effect, greater accuracy, decreased procedural pain, increased response rate, and decreased cost with ultrasound guided versus blind injection.  Verbal informed consent obtained.  Time-out conducted.  Noted no overlying erythema, induration, or other signs of local infection.  Skin prepped in a sterile fashion.  Local anesthesia: Topical Ethyl chloride.  With sterile technique and under real time ultrasound guidance:  Greater trochanteric area was visualized and  patient's bursa was noted. A 22-gauge 3 inch needle was inserted and 4 cc of 0.5% Marcaine and 1 cc of Kenalog 40 mg/dL was injected. Pictures taken Completed without difficulty  Pain immediately resolved suggesting accurate placement of the medication.  Advised to call if fevers/chills, erythema, induration, drainage, or persistent bleeding.  Images permanently stored and available for review in the ultrasound unit.  Impression: Technically successful ultrasound guided injection.   Procedure: Real-time Ultrasound Guided Injection of left  greater trochanteric bursitis secondary to patient's body habitus Device: GE Logiq Q7  Ultrasound guided injection is preferred based studies that show increased duration, increased effect, greater accuracy, decreased procedural pain, increased response rate, and decreased cost with ultrasound guided versus blind injection.  Verbal informed consent obtained.  Time-out conducted.  Noted no overlying erythema, induration, or other signs of local infection.  Skin prepped in a sterile fashion.  Local anesthesia: Topical Ethyl chloride.  With sterile technique and under real time ultrasound guidance:  Greater trochanteric area was visualized and patient's bursa was noted. A 22-gauge 3 inch needle was inserted and 4 cc of 0.5% Marcaine and 1 cc of Kenalog 40 mg/dL was injected. Pictures taken Completed without difficulty  Pain immediately resolved suggesting accurate placement of the medication.  Advised to call if fevers/chills, erythema, induration, drainage, or persistent bleeding.  Images permanently stored and available for review in the ultrasound unit.  Impression: Technically successful ultrasound guided injection.   Impression and Recommendations:     This case required medical decision making of moderate complexity. The above documentation has been reviewed and is accurate and complete Lyndal Pulley, DO       Note: This dictation was prepared  with Dragon dictation along with smaller phrase technology. Any transcriptional errors that result from this process are unintentional.

## 2018-11-06 ENCOUNTER — Encounter: Payer: Self-pay | Admitting: Family Medicine

## 2018-11-06 ENCOUNTER — Encounter: Payer: Self-pay | Admitting: Internal Medicine

## 2018-11-06 ENCOUNTER — Ambulatory Visit: Payer: Self-pay

## 2018-11-06 ENCOUNTER — Ambulatory Visit (INDEPENDENT_AMBULATORY_CARE_PROVIDER_SITE_OTHER): Payer: Medicare Other | Admitting: Internal Medicine

## 2018-11-06 ENCOUNTER — Ambulatory Visit (INDEPENDENT_AMBULATORY_CARE_PROVIDER_SITE_OTHER): Payer: Medicare Other | Admitting: Family Medicine

## 2018-11-06 VITALS — BP 162/92 | HR 87 | Ht 62.0 in | Wt 110.0 lb

## 2018-11-06 VITALS — BP 162/84 | HR 70 | Ht 62.0 in | Wt 110.0 lb

## 2018-11-06 DIAGNOSIS — I639 Cerebral infarction, unspecified: Secondary | ICD-10-CM | POA: Diagnosis not present

## 2018-11-06 DIAGNOSIS — M25552 Pain in left hip: Secondary | ICD-10-CM

## 2018-11-06 DIAGNOSIS — M25551 Pain in right hip: Secondary | ICD-10-CM | POA: Diagnosis not present

## 2018-11-06 DIAGNOSIS — R739 Hyperglycemia, unspecified: Secondary | ICD-10-CM | POA: Diagnosis not present

## 2018-11-06 DIAGNOSIS — I10 Essential (primary) hypertension: Secondary | ICD-10-CM

## 2018-11-06 DIAGNOSIS — I872 Venous insufficiency (chronic) (peripheral): Secondary | ICD-10-CM | POA: Insufficient documentation

## 2018-11-06 DIAGNOSIS — M7062 Trochanteric bursitis, left hip: Secondary | ICD-10-CM

## 2018-11-06 DIAGNOSIS — M7061 Trochanteric bursitis, right hip: Secondary | ICD-10-CM | POA: Diagnosis not present

## 2018-11-06 DIAGNOSIS — R600 Localized edema: Secondary | ICD-10-CM | POA: Diagnosis not present

## 2018-11-06 MED ORDER — HYDROCHLOROTHIAZIDE 25 MG PO TABS: 25.0000 mg | ORAL_TABLET | Freq: Every day | ORAL | 3 refills | Status: DC

## 2018-11-06 MED ORDER — METOPROLOL SUCCINATE ER 50 MG PO TB24: 50.0000 mg | ORAL_TABLET | Freq: Every day | ORAL | 3 refills | Status: DC

## 2018-11-06 NOTE — Patient Instructions (Addendum)
Good to se eyou  Ice is yoru friend Stay active Injected both hips again  See me again in 8 weeks Happy holidays!

## 2018-11-06 NOTE — Assessment & Plan Note (Signed)
For compression stockings, leg elevations, low salt, wt control, and increased hct as above,  to f/u any worsening symptoms or concerns

## 2018-11-06 NOTE — Assessment & Plan Note (Signed)
Uncontrolled, ok for increase hct wt 25 mg, and toprol Xl to 50 qd, cont to monitor BP and HR at home as she does

## 2018-11-06 NOTE — Progress Notes (Signed)
Subjective:    Patient ID: Susan [name], female    DOB: 1924-10-21, 84 y.o.   MRN: [id number]  HPI  Here to f/u; overall doing ok,  Pt denies chest pain, increasing sob or doe, wheezing, orthopnea, PND, palpitations, dizziness or syncope, except does have persistent pedal edema, somewhat worse recently, compressionstockings hurt her legs.  Asking for increased HCT..  Pt denies new neurological symptoms such as new headache, or facial or extremity weakness or numbness.  Pt denies polydipsia, polyuria, or low sugar episode.  Pt states overall good compliance with meds, mostly trying to follow appropriate diet, with wt overall stable,  but little exercise however.  BP has been up and down lately, as high as 199, lower in the 140-150's.  Asks for better BP control Also laments recurring skin tears and wondering if there is anything better than neosporin,gauze and regular tape Past Medical History:  Diagnosis Date  . Actinic keratosis, hx of    chest wall (2012)  . Allergic rhinitis   . Anxiety   . Arthritis    "spine, hips" (01/30/2017)  . Bursitis   . Cellulitis of right foot 01/30/2017  . Colon polyps   . Diverticulosis   . Esophageal dysmotilities 10/16/2017  . Fibromyalgia    "qwhere" (01/30/2017)  . Gallstones   . GERD (gastroesophageal reflux disease)   . High cholesterol   . History of kidney stones   . History of stomach ulcers   . Hx of squamous cell carcinoma excision    right mandible (2011), right arm (2014)  . Hypertension   . IBS (irritable bowel syndrome)   . Osteoarthritis   . Pneumonia 2000s X 2   "twice" (01/30/2017)  . Pyloric stenosis   . Stroke [facility]) 10/2014   denies residual on 01/30/2017   Past Surgical History:  Procedure Laterality Date  . APPENDECTOMY    . CARDIOVASCULAR STRESS TEST  04/2016   nuclear cardiolite stress test done in [place] (no ischemia, no LVH, no LV systolic function)  . CATARACT EXTRACTION W/ INTRAOCULAR LENS  IMPLANT, BILATERAL  Bilateral   . CHOLECYSTECTOMY OPEN    . COLONOSCOPY  2009, 2014  . DIAGNOSTIC MAMMOGRAM  2009, 2012  . TONSILLECTOMY      reports that she has never smoked. She has never used smokeless tobacco. She reports that she drinks alcohol. She reports that she does not use drugs. family history includes Heart attack in her father, maternal grandfather, maternal grandmother, mother, paternal grandfather, and paternal grandmother; Stroke in her father, maternal grandfather, maternal grandmother, mother, paternal grandfather, and paternal grandmother. Allergies  Allergen Reactions  . Butrans [Buprenorphine] Other (See Comments)    Severe chest pains  . Codeine Other (See Comments)    Severe Chest pains  . Morphine And Related Other (See Comments)    Severe chest pains   Current Outpatient Medications on File Prior to Visit  Medication Sig Dispense Refill  . acetaminophen (TYLENOL) 650 MG CR tablet Take 650 mg by mouth every 8 (eight) hours as needed for pain.    Marland Kitchen ALPRAZolam (XANAX) 0.5 MG tablet TAKE 1 TABLET BY MOUTH ONCE DAILY AT BEDTIME AS NEEDED FOR ANXIETY 30 tablet 2  . Ascorbic Acid (VITAMIN C) 250 MG CHEW Chew 250 mg by mouth 3 (three) times a week. 2 tab 3 time a week    . aspirin EC 325 MG tablet Take 325 mg by mouth daily.    Marland Kitchen atorvastatin (LIPITOR) 10 MG tablet TAKE 1  TABLET BY MOUTH ONCE DAILY 90 tablet 0  . dicyclomine (BENTYL) 10 MG capsule Take 10 mg by mouth 2 (two) times daily.     . diphenoxylate-atropine (LOMOTIL) 2.5-0.025 MG tablet Take 1 tablet by mouth 3 (three) times daily as needed for diarrhea or loose stools. 90 tablet 1  . famotidine (PEPCID) 20 MG tablet TAKE 1 TABLET BY MOUTH TWICE DAILY 180 tablet 1  . fexofenadine (ALLEGRA) 180 MG tablet Take 180 mg by mouth daily.    . IRON PO Take 65 mg by mouth 3 (three) times a week.    . loperamide (IMODIUM) 2 MG capsule Take by mouth as needed for diarrhea or loose stools.    Marland Kitchen losartan (COZAAR) 100 MG tablet TAKE 1 TABLET  BY MOUTH ONCE DAILY 90 tablet 0  . Omega-3 1000 MG CAPS Take by mouth.    . Vitamin D, Ergocalciferol, (DRISDOL) 50000 units CAPS capsule TAKE 1 CAPSULE BY MOUTH ONCE A WEEK 12 capsule 0   No current facility-administered medications on file prior to visit.    Review of Systems  Constitutional: Negative for other unusual diaphoresis or sweats HENT: Negative for ear discharge or swelling Eyes: Negative for other worsening visual disturbances Respiratory: Negative for stridor or other swelling  Gastrointestinal: Negative for worsening distension or other blood Genitourinary: Negative for retention or other urinary change Musculoskeletal: Negative for other MSK pain or swelling Skin: Negative for color change or other new lesions Neurological: Negative for worsening tremors and other numbness  Psychiatric/Behavioral: Negative for worsening agitation or other fatigue All other system neg per pt    Objective:   Physical Exam BP (!) 162/92   Pulse 87   Ht 5\' 2"  (1.575 m)   Wt 110 lb (49.9 kg)   SpO2 97%   BMI 20.12 kg/m  VS noted,  Constitutional: Pt appears in NAD HENT: Head: NCAT.  Right Ear: External ear normal.  Left Ear: External ear normal.  Eyes: . Pupils are equal, round, and reactive to light. Conjunctivae and EOM are normal Nose: without d/c or deformity Neck: Neck supple. Gross normal ROM Cardiovascular: Normal rate and regular rhythm.   Pulmonary/Chest: Effort normal and breath sounds without rales or wheezing.  Abd:  Soft, NT, ND, + BS, no organomegaly Neurological: Pt is alert. At baseline orientation, motor grossly intact Skin: Skin is warm. No rashes, other new lesions, has bilat 1+ pedal edema, no acute skin tear today Psychiatric: Pt behavior is normal without agitation  No other exam findings Lab Results  Component Value Date   WBC 8.9 10/16/2017   HGB 13.5 10/16/2017   HCT 41.0 10/16/2017   PLT 258.0 10/16/2017   GLUCOSE 127 (H) 05/01/2018   CHOL 227  (H) 05/01/2018   TRIG 213.0 (H) 05/01/2018   HDL 80.10 05/01/2018   LDLDIRECT 102.0 05/01/2018   LDLCALC 104 (H) 10/16/2017   ALT 15 05/01/2018   AST 18 05/01/2018   NA 142 05/01/2018   K 3.5 05/01/2018   CL 101 05/01/2018   CREATININE 0.64 05/01/2018   BUN 22 05/01/2018   CO2 32 05/01/2018   TSH 1.77 10/16/2017   HGBA1C 5.6 05/01/2018       Assessment & Plan:

## 2018-11-06 NOTE — Assessment & Plan Note (Signed)
Bilateral injection given today.  Discussed icing regimen, home exercise, which activities of doing which wants to avoid.  Follow-up again in 8 weeks

## 2018-11-06 NOTE — Patient Instructions (Signed)
Ok to increase the fluid pill HCT to 25 mg per day  OK to increase the metoprolol ER to 50 mg per day  Please continue all other medications as before, and refills have been done if requested.  Please have the pharmacy call with any other refills you may need.  Please continue your efforts at being more active, low cholesterol diet, and weight control  Please keep your appointments with your specialists as you may have planned  Please return in 6 months, or sooner if needed

## 2018-11-06 NOTE — Assessment & Plan Note (Signed)
stable overall by history and exam, recent data reviewed with pt, and pt to continue medical treatment as before,  to f/u any worsening symptoms or concerns  

## 2018-11-11 ENCOUNTER — Other Ambulatory Visit: Payer: Self-pay | Admitting: Internal Medicine

## 2018-11-11 DIAGNOSIS — K582 Mixed irritable bowel syndrome: Secondary | ICD-10-CM

## 2018-11-11 NOTE — Telephone Encounter (Signed)
Copied from [place] [id number]. Topic: General - Other >> Nov 11, 2018 11:53 AM Lennox Solders wrote: Reason for CRM: pt saw dr Jenny Reichmann on 11-06-18. Pt needs refills on generic lomotil, and atorvastatin. [place] [address]

## 2018-11-13 ENCOUNTER — Other Ambulatory Visit: Payer: Self-pay

## 2018-11-13 ENCOUNTER — Other Ambulatory Visit: Payer: Self-pay | Admitting: Nurse Practitioner

## 2018-11-13 DIAGNOSIS — K582 Mixed irritable bowel syndrome: Secondary | ICD-10-CM

## 2018-11-13 MED ORDER — DIPHENOXYLATE-ATROPINE 2.5-0.025 MG PO TABS: 1.0000 | ORAL_TABLET | Freq: Three times a day (TID) | ORAL | 1 refills | Status: DC | PRN

## 2018-11-13 MED ORDER — ATORVASTATIN CALCIUM 10 MG PO TABS: 10.0000 mg | ORAL_TABLET | Freq: Every day | ORAL | 1 refills | Status: DC

## 2018-11-13 NOTE — Telephone Encounter (Signed)
Requested medication (s) are due for refill today: yes  Requested medication (s) are on the active medication list:yes  Last refill:  Historical provider  Future visit scheduled: yes  Notes to clinic:  Historical provider    Requested Prescriptions  Pending Prescriptions Disp Refills   diphenoxylate-atropine (LOMOTIL) 2.5-0.025 MG tablet 90 tablet 1    Sig: Take 1 tablet by mouth 3 (three) times daily as needed for diarrhea or loose stools.     Not Delegated - Gastroenterology:  Antidiarrheals Failed - 11/12/2018 12:20 PM      Failed - This refill cannot be delegated      Passed - Valid encounter within last 12 months    Recent Outpatient Visits          1 week ago Bilateral hip pain   [place], Olevia Bowens, DO   1 week ago Hyperglycemia   [facility] -Georges Mouse, MD   2 months ago Bilateral hip pain   [place], [place], DO   4 months ago Bilateral hip pain   [place], [place], DO   6 months ago Contusion of left hip, subsequent encounter   [place], [place], DO      Future Appointments            In 1 month Lyndal Pulley, [place], [place]   In 5 months Biagio Borg, MD [place], [facility]         Signed Prescriptions Disp Refills   atorvastatin (LIPITOR) 10 MG tablet 90 tablet 1    Sig: Take 1 tablet (10 mg total) by mouth daily.     Cardiovascular:  Antilipid - Statins Failed - 11/12/2018 12:20 PM      Failed - Total Cholesterol in normal range and within 360 days    Cholesterol  Date Value Ref Range Status  05/01/2018 227 (H) 0 - 200 mg/dL Final    Comment:    ATP III Classification       Desirable:  < 200 mg/dL               Borderline High:  200 - 239 mg/dL          High:  > = 240 mg/dL         Failed - LDL in normal range and  within 360 days    LDL Cholesterol  Date Value Ref Range Status  10/16/2017 104 (H) 0 - 99 mg/dL Final         Failed - Triglycerides in normal range and within 360 days    Triglycerides  Date Value Ref Range Status  05/01/2018 213.0 (H) 0.0 - 149.0 mg/dL Final    Comment:    Normal:  <150 mg/dLBorderline High:  150 - 199 mg/dL         Passed - HDL in normal range and within 360 days    HDL  Date Value Ref Range Status  05/01/2018 80.10 >39.00 mg/dL Final         Passed - Patient is not pregnant      Passed - Valid encounter within last 12 months    Recent Outpatient Visits          1 week ago Bilateral hip pain   [place], Olevia Bowens, DO  1 week ago Hyperglycemia   [place], James W, MD   2 months ago Bilateral hip pain   [place], [place], DO   4 months ago Bilateral hip pain   [place], [place], DO   6 months ago Contusion of left hip, subsequent encounter   [facility], [place], DO      Future Appointments            In 1 month Tamala Julian Olevia Bowens, [place], [place]   In 5 months Jenny Reichmann, Hunt Oris, MD [place], [facility]

## 2018-11-13 NOTE — Telephone Encounter (Signed)
Done erx 

## 2018-11-28 ENCOUNTER — Other Ambulatory Visit: Payer: Self-pay | Admitting: Family Medicine

## 2018-11-29 NOTE — Telephone Encounter (Signed)
Refill done.  

## 2018-12-03 ENCOUNTER — Encounter: Payer: Self-pay | Admitting: Gastroenterology

## 2018-12-03 ENCOUNTER — Ambulatory Visit (INDEPENDENT_AMBULATORY_CARE_PROVIDER_SITE_OTHER): Payer: Medicare Other | Admitting: Gastroenterology

## 2018-12-03 ENCOUNTER — Other Ambulatory Visit (INDEPENDENT_AMBULATORY_CARE_PROVIDER_SITE_OTHER): Payer: Medicare Other

## 2018-12-03 VITALS — BP 150/80 | HR 84 | Ht 61.0 in | Wt 110.0 lb

## 2018-12-03 DIAGNOSIS — K529 Noninfective gastroenteritis and colitis, unspecified: Secondary | ICD-10-CM | POA: Insufficient documentation

## 2018-12-03 DIAGNOSIS — I639 Cerebral infarction, unspecified: Secondary | ICD-10-CM | POA: Diagnosis not present

## 2018-12-03 LAB — IGA: IgA: 266 mg/dL (ref 68–378)

## 2018-12-03 MED ORDER — DICYCLOMINE HCL 10 MG PO CAPS: 10.0000 mg | ORAL_CAPSULE | Freq: Two times a day (BID) | ORAL | 3 refills | Status: DC

## 2018-12-03 MED ORDER — CHOLESTYRAMINE 4 G PO PACK: 4.0000 g | PACK | Freq: Every day | ORAL | 1 refills | Status: DC

## 2018-12-03 NOTE — Progress Notes (Signed)
Agree with assessment and plan. Sometimes Losartan can cause or be an inciting factor for Microscopic/Collagenous colitis. May consider role dependent on Questran and Lomotil and Imodium for Flexible Sigmoidoscopy.

## 2018-12-03 NOTE — Progress Notes (Signed)
12/03/2018 Susan [name] [id number] 1924/07/26   HISTORY OF PRESENT ILLNESS: This is a pleasant 84 year old female who is new to our practice.  She moved to [place] from [place] a little over a year ago, maybe a year and a half ago to be near her daughter.  She is here today with complaints of chronic diarrhea.  She tells me that she has had diarrhea for the past 40 years intermittently.  Says that she was given a diagnosis of irritable bowel.  Says her last colonoscopy was in [place], [place] in 2007 at which time she was found to have diverticulosis and a couple of polyps that were removed.  She currently takes Imodium and/or Lomotil as needed for her diarrhea and takes dicyclomine 10 mg twice daily as needed as well.  She tells me that over the past years or so her diarrhea has become worse or at least more persistent almost on a daily basis at this point.  It is affecting her life and controlling her daily routine and ability to get out and about.  She cannot identify any particular foods that seem to make her symptoms worse.  She is status post cholecystectomy, but that was when she was in her 50s.  After review of her medication list the only medication that I can identify that could be affecting her symptoms is the losartan, which was actually started since she moved down here, shortly over a year ago.  She denies seeing any blood in her stool.  She denies any abdominal pain except for maybe some associated cramping when she has to have a bowel movement.  Her weight has been stable.  TSH was normal 10/2017.   Past Medical History:  Diagnosis Date  . Actinic keratosis, hx of    chest wall (2012)  . Allergic rhinitis   . Anxiety   . Arthritis    "spine, hips" (01/30/2017)  . Bursitis   . Cellulitis of right foot 01/30/2017  . Colon polyps   . Diverticulosis   . Esophageal dysmotilities 10/16/2017  . Fibromyalgia    "qwhere" (01/30/2017)  . Gallstones   . GERD (gastroesophageal  reflux disease)   . High cholesterol   . History of kidney stones   . History of stomach ulcers   . Hx of squamous cell carcinoma excision    right mandible (2011), right arm (2014)  . Hypertension   . IBS (irritable bowel syndrome)   . Osteoarthritis   . Pneumonia 2000s X 2   "twice" (01/30/2017)  . Pyloric stenosis   . Stroke [facility]) 10/2014   denies residual on 01/30/2017   Past Surgical History:  Procedure Laterality Date  . APPENDECTOMY    . CARDIOVASCULAR STRESS TEST  04/2016   nuclear cardiolite stress test done in [place] (no ischemia, no LVH, no LV systolic function)  . CATARACT EXTRACTION W/ INTRAOCULAR LENS  IMPLANT, BILATERAL Bilateral   . CHOLECYSTECTOMY OPEN    . COLONOSCOPY  2009, 2014  . DIAGNOSTIC MAMMOGRAM  2009, 2012  . TONSILLECTOMY      reports that she has never smoked. She has never used smokeless tobacco. She reports current alcohol use. She reports that she does not use drugs. family history includes Heart attack in her father, maternal grandfather, maternal grandmother, mother, paternal grandfather, and paternal grandmother; Stroke in her father, maternal grandfather, maternal grandmother, mother, paternal grandfather, and paternal grandmother. Allergies  Allergen Reactions  . Butrans [Buprenorphine] Other (See Comments)    Severe  chest pains  . Codeine Other (See Comments)    Severe Chest pains  . Morphine And Related Other (See Comments)    Severe chest pains      Outpatient Encounter Medications as of 12/03/2018  Medication Sig  . acetaminophen (TYLENOL) 650 MG CR tablet Take 650 mg by mouth every 8 (eight) hours as needed for pain.  Marland Kitchen ALPRAZolam (XANAX) 0.5 MG tablet TAKE 1 TABLET BY MOUTH ONCE DAILY AT BEDTIME AS NEEDED FOR ANXIETY  . Ascorbic Acid (VITAMIN C) 250 MG CHEW Chew 250 mg by mouth 3 (three) times a week. 2 tab 3 time a week  . aspirin EC 325 MG tablet Take 325 mg by mouth daily.  Marland Kitchen atorvastatin (LIPITOR) 10 MG tablet Take 1 tablet  (10 mg total) by mouth daily.  Marland Kitchen dicyclomine (BENTYL) 10 MG capsule Take 10 mg by mouth 2 (two) times daily.   . diphenoxylate-atropine (LOMOTIL) 2.5-0.025 MG tablet Take 1 tablet by mouth 3 (three) times daily as needed for diarrhea or loose stools.  . famotidine (PEPCID) 20 MG tablet TAKE 1 TABLET BY MOUTH TWICE DAILY  . fexofenadine (ALLEGRA) 180 MG tablet Take 180 mg by mouth daily.  . hydrochlorothiazide (HYDRODIURIL) 25 MG tablet Take 1 tablet (25 mg total) by mouth daily.  . IRON PO Take 65 mg by mouth 3 (three) times a week.  . loperamide (IMODIUM) 2 MG capsule Take by mouth as needed for diarrhea or loose stools.  Marland Kitchen losartan (COZAAR) 100 MG tablet TAKE 1 TABLET BY MOUTH ONCE DAILY  . metoprolol succinate (TOPROL-XL) 50 MG 24 hr tablet Take 1 tablet (50 mg total) by mouth daily. Take with or immediately following a meal.  . Omega 3-6-9 Fatty Acids (OMEGA-3 FUSION) LIQD Take by mouth. 2840 mg, take one teaspoon daily  . Vitamin D, Ergocalciferol, (DRISDOL) 1.25 MG (50000 UT) CAPS capsule TAKE 1 CAPSULE BY MOUTH ONCE A WEEK  . [DISCONTINUED] Omega-3 1000 MG CAPS Take by mouth.   No facility-administered encounter medications on file as of 12/03/2018.      REVIEW OF SYSTEMS  : All other systems reviewed and negative except where noted in the History of Present Illness.   PHYSICAL EXAM: BP (!) 150/80   Pulse 84   Ht 5\' 1"  (1.549 m)   Wt 110 lb (49.9 kg)   BMI 20.78 kg/m  General: Well developed white female in no acute distress Head: Normocephalic and atraumatic Eyes:  Sclerae anicteric, conjunctiva pink. Ears: Normal auditory acuity  Lungs: Clear throughout to auscultation; no increased WOB. Heart: Regular rate and rhythm; no increased WOB. Abdomen: Soft, non-distended.  BS present and hyperactive.  Non-tender. Musculoskeletal: Symmetrical with no gross deformities  Skin: No lesions on visible extremities Extremities: No edema  Neurological: Alert oriented x 4, grossly  non-focal Psychological:  Alert and cooperative. Normal mood and affect  ASSESSMENT AND PLAN: *84 year old female with chronic intermittent diarrhea for the past 40 years.  Reports that she was given a diagnosis of IBS in the past.  Over the past year or more since moving to [place] symptoms have worsen or become more persistent.  She may very well have a couple of conditions contributing to her diarrhea.  IBS is certainly appropriate, but she also started losartan a little over a year ago, around the time her symptoms seemed to worsen.  She also does not have a gallbladder so question if it could be somewhat bile salt related.  We will check labs to  rule out celiac disease and she is going to try to follow a lactose-free diet for 3 weeks.  She is going to make an appointment with her PCP to discuss discontinuing and changing her losartan.  We will try Questran powder once daily to start and can continue either Lomotil or Imodium as needed as well.  We will refill Bentyl 10 mg twice daily for her use as needed.  She will follow-up with me in 3 to 4 weeks.   CC:  Biagio Borg, MD

## 2018-12-03 NOTE — Patient Instructions (Addendum)
If you are age 82 or older, your body mass index should be between 23-30. Your Body mass index is 20.78 kg/m. If this is out of the aforementioned range listed, please consider follow up with your Primary Care Provider.  If you are age 32 or younger, your body mass index should be between 19-25. Your Body mass index is 20.78 kg/m. If this is out of the aformentioned range listed, please consider follow up with your Primary Care Provider.   Your provider has requested that you go to the basement level for lab work before leaving today. Press "B" on the elevator. The lab is located at the first door on the left as you exit the elevator.  We have sent the following medications to your pharmacy for you to pick up at your convenience: [place] have been given a lactose free diet.  Discuss with Dr. Jenny Reichmann - Losartan.  Follow up with me on January 02, 2019 at 11 am.  Thank you for choosing me and [facility].   Alonza Bogus, PA-C

## 2018-12-05 ENCOUNTER — Other Ambulatory Visit: Payer: Self-pay | Admitting: Family Medicine

## 2018-12-05 ENCOUNTER — Other Ambulatory Visit: Payer: Self-pay | Admitting: Internal Medicine

## 2018-12-05 DIAGNOSIS — F418 Other specified anxiety disorders: Secondary | ICD-10-CM

## 2018-12-05 NOTE — Telephone Encounter (Signed)
[place] Controlled Database Checked Last filled: 11/06/18 # 30 LOV w/you: 11/06/18 Next appt w/you: 12/09/18

## 2018-12-05 NOTE — Telephone Encounter (Signed)
Susan [name] is there an alternative that may be more cost efficient for the pt?

## 2018-12-06 ENCOUNTER — Encounter (HOSPITAL_COMMUNITY): Payer: Self-pay | Admitting: Emergency Medicine

## 2018-12-06 ENCOUNTER — Observation Stay (HOSPITAL_COMMUNITY)
Admission: EM | Admit: 2018-12-06 | Discharge: 2018-12-08 | Disposition: A | Discharge status: Home / Self Care | Payer: Medicare Other | Attending: Internal Medicine | Admitting: Internal Medicine

## 2018-12-06 ENCOUNTER — Emergency Department (HOSPITAL_COMMUNITY): Payer: Medicare Other

## 2018-12-06 ENCOUNTER — Other Ambulatory Visit: Payer: Self-pay

## 2018-12-06 DIAGNOSIS — K529 Noninfective gastroenteritis and colitis, unspecified: Secondary | ICD-10-CM | POA: Diagnosis present

## 2018-12-06 DIAGNOSIS — I891 Lymphangitis: Secondary | ICD-10-CM

## 2018-12-06 DIAGNOSIS — Z885 Allergy status to narcotic agent status: Secondary | ICD-10-CM | POA: Insufficient documentation

## 2018-12-06 DIAGNOSIS — R001 Bradycardia, unspecified: Secondary | ICD-10-CM | POA: Insufficient documentation

## 2018-12-06 DIAGNOSIS — L03116 Cellulitis of left lower limb: Secondary | ICD-10-CM | POA: Diagnosis not present

## 2018-12-06 DIAGNOSIS — L03115 Cellulitis of right lower limb: Secondary | ICD-10-CM | POA: Diagnosis present

## 2018-12-06 DIAGNOSIS — R609 Edema, unspecified: Secondary | ICD-10-CM | POA: Diagnosis not present

## 2018-12-06 DIAGNOSIS — M1990 Unspecified osteoarthritis, unspecified site: Secondary | ICD-10-CM | POA: Insufficient documentation

## 2018-12-06 DIAGNOSIS — K219 Gastro-esophageal reflux disease without esophagitis: Secondary | ICD-10-CM | POA: Diagnosis not present

## 2018-12-06 DIAGNOSIS — Z8673 Personal history of transient ischemic attack (TIA), and cerebral infarction without residual deficits: Secondary | ICD-10-CM | POA: Diagnosis not present

## 2018-12-06 DIAGNOSIS — Z7982 Long term (current) use of aspirin: Secondary | ICD-10-CM | POA: Diagnosis not present

## 2018-12-06 DIAGNOSIS — L0390 Cellulitis, unspecified: Secondary | ICD-10-CM | POA: Diagnosis present

## 2018-12-06 DIAGNOSIS — I10 Essential (primary) hypertension: Secondary | ICD-10-CM | POA: Diagnosis not present

## 2018-12-06 DIAGNOSIS — I872 Venous insufficiency (chronic) (peripheral): Secondary | ICD-10-CM | POA: Insufficient documentation

## 2018-12-06 DIAGNOSIS — E7800 Pure hypercholesterolemia, unspecified: Secondary | ICD-10-CM | POA: Insufficient documentation

## 2018-12-06 DIAGNOSIS — L97919 Non-pressure chronic ulcer of unspecified part of right lower leg with unspecified severity: Secondary | ICD-10-CM | POA: Diagnosis not present

## 2018-12-06 DIAGNOSIS — E876 Hypokalemia: Secondary | ICD-10-CM | POA: Diagnosis present

## 2018-12-06 DIAGNOSIS — M797 Fibromyalgia: Secondary | ICD-10-CM | POA: Insufficient documentation

## 2018-12-06 DIAGNOSIS — F419 Anxiety disorder, unspecified: Secondary | ICD-10-CM | POA: Insufficient documentation

## 2018-12-06 HISTORY — DX: Lymphangitis: I89.1

## 2018-12-06 LAB — CBC WITH DIFFERENTIAL/PLATELET
Abs Immature Granulocytes: 0.08 10*3/uL — ABNORMAL HIGH (ref 0.00–0.07)
Basophils Absolute: 0 10*3/uL (ref 0.0–0.1)
Basophils Relative: 1 %
Eosinophils Absolute: 0.1 10*3/uL (ref 0.0–0.5)
Eosinophils Relative: 1 %
HCT: 41.1 % (ref 36.0–46.0)
Hemoglobin: 13.3 g/dL (ref 12.0–15.0)
Immature Granulocytes: 1 %
Lymphocytes Relative: 22 %
Lymphs Abs: 1.4 10*3/uL (ref 0.7–4.0)
MCH: 32 pg (ref 26.0–34.0)
MCHC: 32.4 g/dL (ref 30.0–36.0)
MCV: 98.8 fL (ref 80.0–100.0)
Monocytes Absolute: 0.6 10*3/uL (ref 0.1–1.0)
Monocytes Relative: 10 %
NEUTROS PCT: 65 %
Neutro Abs: 4.1 10*3/uL (ref 1.7–7.7)
Platelets: 277 10*3/uL (ref 150–400)
RBC: 4.16 MIL/uL (ref 3.87–5.11)
RDW: 13.2 % (ref 11.5–15.5)
WBC: 6.3 10*3/uL (ref 4.0–10.5)
nRBC: 0 % (ref 0.0–0.2)

## 2018-12-06 LAB — MAGNESIUM: Magnesium: 2 mg/dL (ref 1.7–2.4)

## 2018-12-06 LAB — COMPREHENSIVE METABOLIC PANEL
ALK PHOS: 41 U/L (ref 38–126)
ALT: 19 U/L (ref 0–44)
AST: 23 U/L (ref 15–41)
Albumin: 3.4 g/dL — ABNORMAL LOW (ref 3.5–5.0)
Anion gap: 12 (ref 5–15)
BUN: 14 mg/dL (ref 8–23)
CO2: 26 mmol/L (ref 22–32)
Calcium: 8.6 mg/dL — ABNORMAL LOW (ref 8.9–10.3)
Chloride: 102 mmol/L (ref 98–111)
Creatinine, Ser: 0.54 mg/dL (ref 0.44–1.00)
GFR calc Af Amer: >60 mL/min (ref >60)
GFR calc non Af Amer: >60 mL/min (ref >60)
GLUCOSE: 96 mg/dL (ref 70–99)
Potassium: 2.9 mmol/L — ABNORMAL LOW (ref 3.5–5.1)
Sodium: 140 mmol/L (ref 135–145)
Total Bilirubin: 0.7 mg/dL (ref 0.3–1.2)
Total Protein: 6.4 g/dL — ABNORMAL LOW (ref 6.5–8.1)

## 2018-12-06 LAB — TISSUE TRANSGLUTAMINASE ABS,IGG,IGA
(tTG) Ab, IgA: 1 U/mL
(tTG) Ab, IgG: 1 U/mL

## 2018-12-06 LAB — BRAIN NATRIURETIC PEPTIDE: B Natriuretic Peptide: 121.7 pg/mL — ABNORMAL HIGH (ref 0.0–100.0)

## 2018-12-06 MED ORDER — DICYCLOMINE HCL 10 MG PO CAPS
10.0000 mg | ORAL_CAPSULE | Freq: Two times a day (BID) | ORAL | Status: DC
  Administered 2018-12-06 – 2018-12-08 (×5): 10 mg via ORAL
  Filled 2018-12-06 (×5): qty 1

## 2018-12-06 MED ORDER — POTASSIUM CHLORIDE CRYS ER 20 MEQ PO TBCR
40.0000 meq | EXTENDED_RELEASE_TABLET | Freq: Two times a day (BID) | ORAL | Status: AC
  Administered 2018-12-06 – 2018-12-07 (×3): 40 meq via ORAL
  Filled 2018-12-06 (×3): qty 2

## 2018-12-06 MED ORDER — HYDROCHLOROTHIAZIDE 25 MG PO TABS
25.0000 mg | ORAL_TABLET | Freq: Every day | ORAL | Status: DC
  Administered 2018-12-06 – 2018-12-08 (×3): 25 mg via ORAL
  Filled 2018-12-06 (×3): qty 1

## 2018-12-06 MED ORDER — ACETAMINOPHEN 650 MG RE SUPP: 650.0000 mg | Freq: Four times a day (QID) | RECTAL | Status: DC | PRN

## 2018-12-06 MED ORDER — SENNOSIDES-DOCUSATE SODIUM 8.6-50 MG PO TABS: 1.0000 | ORAL_TABLET | Freq: Every evening | ORAL | Status: DC | PRN

## 2018-12-06 MED ORDER — ATORVASTATIN CALCIUM 10 MG PO TABS
10.0000 mg | ORAL_TABLET | Freq: Every day | ORAL | Status: DC
  Administered 2018-12-06 – 2018-12-08 (×3): 10 mg via ORAL
  Filled 2018-12-06 (×3): qty 1

## 2018-12-06 MED ORDER — CEFAZOLIN SODIUM-DEXTROSE 2-4 GM/100ML-% IV SOLN
2.0000 g | Freq: Once | INTRAVENOUS | Status: AC
  Administered 2018-12-06: 2 g via INTRAVENOUS
  Filled 2018-12-06: qty 100

## 2018-12-06 MED ORDER — LORATADINE 10 MG PO TABS
10.0000 mg | ORAL_TABLET | Freq: Every day | ORAL | Status: DC
  Administered 2018-12-06 – 2018-12-08 (×3): 10 mg via ORAL
  Filled 2018-12-06 (×3): qty 1

## 2018-12-06 MED ORDER — CEFAZOLIN SODIUM-DEXTROSE 1-4 GM/50ML-% IV SOLN
1.0000 g | Freq: Two times a day (BID) | INTRAVENOUS | Status: DC
  Administered 2018-12-06 – 2018-12-08 (×4): 1 g via INTRAVENOUS
  Filled 2018-12-06 (×5): qty 50

## 2018-12-06 MED ORDER — LOSARTAN POTASSIUM 50 MG PO TABS: 100.0000 mg | ORAL_TABLET | Freq: Every day | ORAL | Status: DC

## 2018-12-06 MED ORDER — LOPERAMIDE HCL 2 MG PO CAPS
2.0000 mg | ORAL_CAPSULE | ORAL | Status: DC | PRN
  Administered 2018-12-06: 2 mg via ORAL
  Filled 2018-12-06: qty 1

## 2018-12-06 MED ORDER — ALPRAZOLAM 0.5 MG PO TABS
0.5000 mg | ORAL_TABLET | Freq: Every evening | ORAL | Status: DC | PRN
  Administered 2018-12-06 – 2018-12-07 (×2): 0.5 mg via ORAL
  Filled 2018-12-06 (×2): qty 1

## 2018-12-06 MED ORDER — ONDANSETRON HCL 4 MG/2ML IJ SOLN: 4.0000 mg | Freq: Four times a day (QID) | INTRAMUSCULAR | Status: DC | PRN

## 2018-12-06 MED ORDER — FAMOTIDINE 20 MG PO TABS
20.0000 mg | ORAL_TABLET | Freq: Two times a day (BID) | ORAL | Status: DC
  Administered 2018-12-06 – 2018-12-08 (×5): 20 mg via ORAL
  Filled 2018-12-06 (×6): qty 1

## 2018-12-06 MED ORDER — LOPERAMIDE HCL 2 MG PO CAPS: 2.0000 mg | ORAL_CAPSULE | ORAL | Status: DC | PRN

## 2018-12-06 MED ORDER — LOSARTAN POTASSIUM 50 MG PO TABS
100.0000 mg | ORAL_TABLET | Freq: Every day | ORAL | Status: DC
  Administered 2018-12-06 – 2018-12-08 (×3): 100 mg via ORAL
  Filled 2018-12-06 (×3): qty 2

## 2018-12-06 MED ORDER — ACETAMINOPHEN 325 MG PO TABS
650.0000 mg | ORAL_TABLET | Freq: Four times a day (QID) | ORAL | Status: DC | PRN
  Administered 2018-12-06 – 2018-12-08 (×5): 650 mg via ORAL
  Filled 2018-12-06 (×5): qty 2

## 2018-12-06 MED ORDER — DIPHENOXYLATE-ATROPINE 2.5-0.025 MG PO TABS
1.0000 | ORAL_TABLET | Freq: Three times a day (TID) | ORAL | Status: DC | PRN
  Administered 2018-12-07: 1 via ORAL
  Filled 2018-12-06: qty 1

## 2018-12-06 MED ORDER — SODIUM CHLORIDE 0.9 % IV SOLN: INTRAVENOUS | Status: DC | PRN

## 2018-12-06 MED ORDER — CHOLESTYRAMINE LIGHT 4 G PO PACK
4.0000 g | PACK | Freq: Every day | ORAL | Status: DC
  Administered 2018-12-06 – 2018-12-07 (×2): 4 g via ORAL
  Filled 2018-12-06 (×3): qty 1

## 2018-12-06 MED ORDER — ENOXAPARIN SODIUM 40 MG/0.4ML ~~LOC~~ SOLN
40.0000 mg | SUBCUTANEOUS | Status: DC
  Administered 2018-12-06 – 2018-12-08 (×3): 40 mg via SUBCUTANEOUS
  Filled 2018-12-06 (×3): qty 0.4

## 2018-12-06 MED ORDER — ONDANSETRON HCL 4 MG PO TABS: 4.0000 mg | ORAL_TABLET | Freq: Four times a day (QID) | ORAL | Status: DC | PRN

## 2018-12-06 MED ORDER — COLLAGENASE 250 UNIT/GM EX OINT
TOPICAL_OINTMENT | Freq: Every day | CUTANEOUS | Status: DC
  Administered 2018-12-06 – 2018-12-08 (×3): via TOPICAL
  Filled 2018-12-06: qty 90

## 2018-12-06 MED ORDER — ASPIRIN EC 325 MG PO TBEC
325.0000 mg | DELAYED_RELEASE_TABLET | Freq: Every day | ORAL | Status: DC
  Administered 2018-12-06 – 2018-12-08 (×3): 325 mg via ORAL
  Filled 2018-12-06 (×3): qty 1

## 2018-12-06 MED ORDER — METOPROLOL SUCCINATE ER 50 MG PO TB24
50.0000 mg | ORAL_TABLET | Freq: Every day | ORAL | Status: DC
  Administered 2018-12-06 – 2018-12-08 (×3): 50 mg via ORAL
  Filled 2018-12-06 (×3): qty 1

## 2018-12-06 MED ORDER — CHOLESTYRAMINE 4 G PO PACK
4.0000 g | PACK | Freq: Every day | ORAL | Status: DC
  Filled 2018-12-06: qty 1

## 2018-12-06 NOTE — ED Provider Notes (Signed)
[facility] DEPT Provider Note   CSN: [id number] Arrival date & time: 12/06/18  0321     History   Chief Complaint Chief Complaint  Patient presents with  . Leg Pain    HPI Susan [name] is a 85 y.o. female who presents emergency department chief complaint of leg swelling and pain.  She is attended by her daughter who is at the bedside.  She has had worsening edema in the bilateral lower extremities over the past month.  Over the past week she noticed worsening pain swelling, erythema and tenderness of the right lower extremity.  Her daughter states that previously she has got septic from cellulitis and they were worried that this might occur.  She denies shortness of breath she denies orthopnea or PND she denies fevers or chills. HPI  Past Medical History:  Diagnosis Date  . Actinic keratosis, hx of    chest wall (2012)  . Allergic rhinitis   . Anxiety   . Arthritis    "spine, hips" (01/30/2017)  . Bursitis   . Cellulitis of right foot 01/30/2017  . Colon polyps   . Diverticulosis   . Esophageal dysmotilities 10/16/2017  . Fibromyalgia    "qwhere" (01/30/2017)  . Gallstones   . GERD (gastroesophageal reflux disease)   . High cholesterol   . History of kidney stones   . History of stomach ulcers   . Hx of squamous cell carcinoma excision    right mandible (2011), right arm (2014)  . Hypertension   . IBS (irritable bowel syndrome)   . Osteoarthritis   . Pneumonia 2000s X 2   "twice" (01/30/2017)  . Pyloric stenosis   . Stroke [facility]) 10/2014   denies residual on 01/30/2017    Patient Active Problem List   Diagnosis Date Noted  . Chronic diarrhea 12/03/2018  . Pedal edema 11/06/2018  . Hyperglycemia 05/01/2018  . Contusion of left hip 04/22/2018  . Esophageal dysmotilities 10/16/2017  . Anxiety about health 05/16/2017  . Hearing loss 05/16/2017  . Greater trochanteric bursitis of both hips 04/24/2017  . Sicca syndrome, unspecified  ([name]) 04/06/2017  . Pyloric stenosis 04/06/2017  . Hyperlipidemia 04/06/2017  . Macular degeneration 04/06/2017  . Fibromyalgia 03/21/2017  . CVA (cerebral vascular accident) ([name]) 03/21/2017  . IBS (irritable bowel syndrome) 03/16/2017  . Dysphagia 03/16/2017  . GERD with esophagitis 03/16/2017  . Arthritis of hip 02/13/2017  . Essential hypertension 01/30/2017  . Elevated troponin 01/30/2017    Past Surgical History:  Procedure Laterality Date  . APPENDECTOMY    . CARDIOVASCULAR STRESS TEST  04/2016   nuclear cardiolite stress test done in [place] (no ischemia, no LVH, no LV systolic function)  . CATARACT EXTRACTION W/ INTRAOCULAR LENS  IMPLANT, BILATERAL Bilateral   . CHOLECYSTECTOMY OPEN    . COLONOSCOPY  2009, 2014  . DIAGNOSTIC MAMMOGRAM  2009, 2012  . TONSILLECTOMY       OB History   No obstetric history on file.      Home Medications    Prior to Admission medications   Medication Sig Start Date End Date Taking? Authorizing Provider  acetaminophen (TYLENOL) 650 MG CR tablet Take 650 mg by mouth every 8 (eight) hours as needed for pain.   Yes [provider]  ALPRAZolam Duanne Moron) 0.5 MG tablet TAKE 1 TABLET BY MOUTH ONCE DAILY AT BEDTIME AS NEEDED FOR ANXIETY 12/05/18  Yes Biagio Borg, MD  Ascorbic Acid (VITAMIN C) 250 MG CHEW Chew 250 mg  by mouth 3 (three) times a week. 2 tab 3 time a week   Yes [provider]  aspirin EC 325 MG tablet Take 325 mg by mouth daily.   Yes [provider]  atorvastatin (LIPITOR) 10 MG tablet Take 1 tablet (10 mg total) by mouth daily. 11/13/18  Yes Biagio Borg, MD  diphenoxylate-atropine (LOMOTIL) 2.5-0.025 MG tablet Take 1 tablet by mouth 3 (three) times daily as needed for diarrhea or loose stools. 11/13/18  Yes Biagio Borg, MD  famotidine (PEPCID) 20 MG tablet TAKE 1 TABLET BY MOUTH TWICE DAILY 10/29/18  Yes Biagio Borg, MD  fexofenadine (ALLEGRA) 180 MG tablet Take 180 mg by mouth daily.   Yes  [provider]  hydrochlorothiazide (HYDRODIURIL) 25 MG tablet Take 1 tablet (25 mg total) by mouth daily. 11/06/18  Yes Biagio Borg, MD  IRON PO Take 65 mg by mouth 3 (three) times a week.   Yes [provider]  loperamide (IMODIUM) 2 MG capsule Take by mouth as needed for diarrhea or loose stools.   Yes [provider]  losartan (COZAAR) 100 MG tablet TAKE 1 TABLET BY MOUTH ONCE DAILY 10/09/18  Yes Biagio Borg, MD  metoprolol succinate (TOPROL-XL) 50 MG 24 hr tablet Take 1 tablet (50 mg total) by mouth daily. Take with or immediately following a meal. 11/06/18  Yes Biagio Borg, MD  Omega 3-6-9 Fatty Acids (OMEGA-3 FUSION) LIQD Take by mouth. 2840 mg, take one teaspoon daily   Yes [provider]  Vitamin D, Ergocalciferol, (DRISDOL) 1.25 MG (50000 UT) CAPS capsule TAKE 1 CAPSULE BY MOUTH ONCE A WEEK 12/05/18  Yes Hulan Saas M, DO  cholestyramine (QUESTRAN) 4 g packet Take 1 packet (4 g total) by mouth daily. 12/03/18   Zehr, Laban Emperor, PA-C  dicyclomine (BENTYL) 10 MG capsule Take 1 capsule (10 mg total) by mouth 2 (two) times daily. 12/03/18   Zehr, Laban Emperor, PA-C    Family History Family History  Problem Relation Age of Onset  . Stroke Mother   . Heart attack Mother   . Stroke Father   . Heart attack Father   . Heart attack Maternal Grandmother   . Stroke Maternal Grandmother   . Stroke Paternal Grandmother   . Heart attack Paternal Grandmother   . Heart attack Maternal Grandfather   . Stroke Maternal Grandfather   . Stroke Paternal Grandfather   . Heart attack Paternal Grandfather   . Colon cancer Neg Hx   . Pancreatic cancer Neg Hx   . Stomach cancer Neg Hx   . Esophageal cancer Neg Hx     Social History Social History   Tobacco Use  . Smoking status: Never Smoker  . Smokeless tobacco: Never Used  Substance Use Topics  . Alcohol use: Yes    Comment: glass of red wine maybe once week  . Drug use: No     Allergies   Butrans  [buprenorphine]; Codeine; and Morphine and related   Review of Systems Review of Systems Ten systems reviewed and are negative for acute change, except as noted in the HPI.    Physical Exam Updated Vital Signs BP (!) 194/86 (BP Location: Left Arm)   Pulse 63   Temp 98.2 F (36.8 C) (Oral)   Resp 16   SpO2 97%   Physical Exam Physical Exam  Nursing note and vitals reviewed. Constitutional: She is oriented to person, place, and time. She appears well-developed and well-nourished.  No distress.  HENT:  Head: Normocephalic and atraumatic.  Eyes: Conjunctivae normal and EOM are normal. Pupils are equal, round, and reactive to light. No scleral icterus.  Neck: Normal range of motion.  Cardiovascular: Normal rate, regular rhythm and normal heart sounds.  Exam reveals no gallop and no friction rub.   No murmur heard. Pulmonary/Chest: Effort normal and breath sounds normal. No respiratory distress.  Abdominal: Soft. Bowel sounds are normal. She exhibits no distension and no mass. There is no tenderness. There is no guarding.  Neurological: She is alert and oriented to person, place, and time.  Skin: Bilateral lower extremity edema, venous stasis dermatitis bilaterally.  The right lower extremity has multiple ulcerations and tissue breakdown with warmth erythema and tenderness suggestive of cellulitis.  Bilateral DP and PT pulses palpable.    ED Treatments / Results  Labs (all labs ordered are listed, but only abnormal results are displayed) Labs Reviewed - No data to display  EKG None  Radiology No results found.  Procedures Procedures (including critical care time)  Medications Ordered in ED Medications - No data to display   Initial Impression / Assessment and Plan / ED Course  I have reviewed the triage vital signs and the nursing notes.  Pertinent labs & imaging results that were available during my care of the patient were reviewed by me and considered in my medical  decision making (see chart for details).    85 year old female who presents with lower extremity cellulitis and edema.  She lives in an independent living facility.  Patient given Ancef.  Notably hypokalemic.  During her stay the emergency department she developed lymphangitis.  Patient will be admitted to hospitalist.  I have spoken to Dr. Loleta Books who is accepted the patient.  He was stable throughout her ER visit.  Final Clinical Impressions(s) / ED Diagnoses   Final diagnoses:  Cellulitis of right lower extremity  Lymphangitis  Hypokalemia    ED Discharge Orders    None       Margarita Mail, PA-C 12/06/18 [id number]    Duffy Bruce, MD 12/06/18 1907

## 2018-12-06 NOTE — Progress Notes (Signed)
PHARMACY NOTE:  ANTIMICROBIAL RENAL DOSAGE ADJUSTMENT  Current antimicrobial regimen includes a mismatch between antimicrobial dosage and estimated renal function.  As per policy approved by the Pharmacy & Therapeutics and Medical Executive Committees, the antimicrobial dosage will be adjusted accordingly.  Current antimicrobial dosage:  Ancef 1gm q8h  Indication: cellulitis  Renal Function:  Estimated Creatinine Clearance: 33.9 mL/min (by C-G formula based on SCr of 0.54 mg/dL). []      On intermittent HD, scheduled: []      On CRRT    Antimicrobial dosage has been changed to:  Ancef 1gm IV q12h    Thank you for allowing pharmacy to be a part of this patient's care.  Lynelle Doctor, [facility] 12/06/2018 9:09 AM

## 2018-12-06 NOTE — ED Notes (Signed)
Patient transported to X-ray 

## 2018-12-06 NOTE — ED Triage Notes (Signed)
Pt from [facility] assisted living came to ed d/t leg pain , leg ulcers and edema. Pt is normally ambulatory but d/t pain and draining rear right leg wound is having difficulty walking.

## 2018-12-06 NOTE — H&P (Signed)
History and Physical  Patient Name: Susan [name]     QQP:[id number]    DOB: January 01, 1924    DOA: 12/06/2018 PCP: Biagio Borg, MD  Patient coming from: [facility]  Chief Complaint: Leg swelling, redness      HPI: Susan [name] is a 85 y.o. F with hx remote stroke no residuals, HTN, chronic diarrhea, and venous insufficiency who presents with right leg swelling and ulcer.  The patient has chronic leg swelling bilaterally, cannot tolerate compression hose, but adheres to low salt and takes HCTZ.  She has chronic venous stasis change to legs, and occasional shin ulcers.  Recently, has had some newer right shin ulcers, and in the last 2-3 days, she has noticed new pain and redness extending from this ulcer.  Tonight, she found the redness had extended all the way up the right inner thigh, so she came to the ER.  No fevers, chills, vomiting.    ED course: -Afebrile, heart rate 63, respirations and pulse ox normal, blood pressure 165/80 -Na 140, K 2.9, Cr 0.5, WBC 6.3K, Hgb 13 - BNP 121 - X-ray of the right tib-fib showed swelling, but no osseous erosions, no fractures -She was given cefazolin, and was noted to have signs of lymphangitis, so the hospitalist group were asked to evaluate for cellulitis with lymphangitis     ROS: Review of Systems  Constitutional: Negative for chills, fever and malaise/fatigue.  Cardiovascular: Positive for leg swelling. Negative for chest pain, palpitations, orthopnea, claudication and PND.  Gastrointestinal: Negative for nausea and vomiting.  All other systems reviewed and are negative.         Past Medical History:  Diagnosis Date  . Actinic keratosis, hx of    chest wall (2012)  . Allergic rhinitis   . Anxiety   . Arthritis    "spine, hips" (01/30/2017)  . Bursitis   . Cellulitis of right foot 01/30/2017  . Colon polyps   . Diverticulosis   . Esophageal dysmotilities 10/16/2017  . Fibromyalgia    "qwhere" (01/30/2017)    . Gallstones   . GERD (gastroesophageal reflux disease)   . High cholesterol   . History of kidney stones   . History of stomach ulcers   . Hx of squamous cell carcinoma excision    right mandible (2011), right arm (2014)  . Hypertension   . IBS (irritable bowel syndrome)   . Osteoarthritis   . Pneumonia 2000s X 2   "twice" (01/30/2017)  . Pyloric stenosis   . Stroke [facility]) 10/2014   denies residual on 01/30/2017    Past Surgical History:  Procedure Laterality Date  . APPENDECTOMY    . CARDIOVASCULAR STRESS TEST  04/2016   nuclear cardiolite stress test done in [place] (no ischemia, no LVH, no LV systolic function)  . CATARACT EXTRACTION W/ INTRAOCULAR LENS  IMPLANT, BILATERAL Bilateral   . CHOLECYSTECTOMY OPEN    . COLONOSCOPY  2009, 2014  . DIAGNOSTIC MAMMOGRAM  2009, 2012  . TONSILLECTOMY      Social History: Patient lives in independent living at [facility].  She moved down here about 2 years ago from [place].  The patient walks unassisted.  Non-smoker.  No dementia.     Allergies  Allergen Reactions  . Butrans [Buprenorphine] Other (See Comments)    Severe chest pains  . Codeine Other (See Comments)    Severe Chest pains  . Morphine And Related Other (See Comments)    Severe chest pains  Family history: family history includes Heart attack in her father, maternal grandfather, maternal grandmother, mother, paternal grandfather, and paternal grandmother; Stroke in her father, maternal grandfather, maternal grandmother, mother, paternal grandfather, and paternal grandmother.  Prior to Admission medications   Medication Sig Start Date End Date Taking? Authorizing Provider  acetaminophen (TYLENOL) 650 MG CR tablet Take 650 mg by mouth every 8 (eight) hours as needed for pain.   Yes [provider]  ALPRAZolam Duanne Moron) 0.5 MG tablet TAKE 1 TABLET BY MOUTH ONCE DAILY AT BEDTIME AS NEEDED FOR ANXIETY 12/05/18  Yes Biagio Borg, MD  Ascorbic Acid (VITAMIN C)  250 MG CHEW Chew 250 mg by mouth 3 (three) times a week. 2 tab 3 time a week   Yes [provider]  aspirin EC 325 MG tablet Take 325 mg by mouth daily.   Yes [provider]  atorvastatin (LIPITOR) 10 MG tablet Take 1 tablet (10 mg total) by mouth daily. 11/13/18  Yes Biagio Borg, MD  diphenoxylate-atropine (LOMOTIL) 2.5-0.025 MG tablet Take 1 tablet by mouth 3 (three) times daily as needed for diarrhea or loose stools. 11/13/18  Yes Biagio Borg, MD  famotidine (PEPCID) 20 MG tablet TAKE 1 TABLET BY MOUTH TWICE DAILY 10/29/18  Yes Biagio Borg, MD  fexofenadine (ALLEGRA) 180 MG tablet Take 180 mg by mouth daily.   Yes [provider]  hydrochlorothiazide (HYDRODIURIL) 25 MG tablet Take 1 tablet (25 mg total) by mouth daily. 11/06/18  Yes Biagio Borg, MD  IRON PO Take 65 mg by mouth 3 (three) times a week.   Yes [provider]  loperamide (IMODIUM) 2 MG capsule Take by mouth as needed for diarrhea or loose stools.   Yes [provider]  losartan (COZAAR) 100 MG tablet TAKE 1 TABLET BY MOUTH ONCE DAILY 10/09/18  Yes Biagio Borg, MD  metoprolol succinate (TOPROL-XL) 50 MG 24 hr tablet Take 1 tablet (50 mg total) by mouth daily. Take with or immediately following a meal. 11/06/18  Yes Biagio Borg, MD  Omega 3-6-9 Fatty Acids (OMEGA-3 FUSION) LIQD Take by mouth. 2840 mg, take one teaspoon daily   Yes [provider]  Vitamin D, Ergocalciferol, (DRISDOL) 1.25 MG (50000 UT) CAPS capsule TAKE 1 CAPSULE BY MOUTH ONCE A WEEK 12/05/18  Yes Hulan Saas M, DO  cholestyramine (QUESTRAN) 4 g packet Take 1 packet (4 g total) by mouth daily. 12/03/18   Zehr, Laban Emperor, PA-C  dicyclomine (BENTYL) 10 MG capsule Take 1 capsule (10 mg total) by mouth 2 (two) times daily. 12/03/18   Zehr, Laban Emperor, PA-C       Physical Exam: BP (!) 165/95 (BP Location: Left Arm)   Pulse 69 Comment: Simultaneous filing. User may not have seen previous data.  Temp 98.2  F (36.8 C) (Oral)   Resp 17   SpO2 95% Comment: Simultaneous filing. User may not have seen previous data. General appearance: Thin elderly adult female, alert and in no acute distress. Interactive.   Eyes: Anicteric, conjunctiva pink, lids and lashes normal. PERRL.    ENT: No nasal deformity, discharge, epistaxis.  Hearing normal. OP moist without lesions.  Dentition normal, lips normal. Neck: No neck masses.  Trachea midline.  No thyromegaly/tenderness. Lymph: No cervical or supraclavicular lymphadenopathy. Skin: Warm and dry.  No jaundice.  On the bilatearl lower legs are varicose veins and venous stasis hyperpigmentation.  In the right shin, anteirorly, there are two quarter-sized ulcerations, one with  black eschar, one newer with yellowish crust.  Surrounding these on the shin there is redness tender to touch.  The calf has well demarcated edema and redness and pain.  This is primarily dependent, but extends up into the groin, possibly consistent with lymphangitis as pictured:      Cardiac: RRR, nl S1-S2, soft SEM.  Capillary refill is brisk.  JVP normal.  Minimal LE edema except dependently right leg, see above.  Radial pulses 2+ and symmetric. Respiratory: Normal respiratory rate and rhythm.  CTAB without rales or wheezes. Abdomen: Abdomen soft.  No TTP. No ascites, distension, hepatosplenomegaly.   MSK: No deformities or effusions.  No cyanosis or clubbing.  Diffuse loss of subq muscle mass and fat. Neuro: Cranial nerves normal.  Sensation intact to light touch. Sensation intact on feet. Speech is fluent.  Muscle strength 5/5 and symmetric.    Psych: Sensorium intact and responding to questions, attention normal.  Behavior appropriate.  Affect normal.  Judgment and insight appear normal.     Labs on Admission:  I have personally reviewed following labs and imaging studies: CBC: Recent Labs  Lab 12/06/18 0541  WBC 6.3  NEUTROABS 4.1  HGB 13.3  HCT 41.1  MCV 98.8  PLT 998    Basic Metabolic Panel: Recent Labs  Lab 12/06/18 0541  NA 140  K 2.9*  CL 102  CO2 26  GLUCOSE 96  BUN 14  CREATININE 0.54  CALCIUM 8.6*   GFR: Estimated Creatinine Clearance: 33.9 mL/min (by C-G formula based on SCr of 0.54 mg/dL).  Liver Function Tests: Recent Labs  Lab 12/06/18 0541  AST 23  ALT 19  ALKPHOS 41  BILITOT 0.7  PROT 6.4*  ALBUMIN 3.4*         Radiological Exams on Admission: Personally reviewed X-ray report below, summarized above: Dg Tibia/fibula Right  Result Date: 12/06/2018 CLINICAL DATA:  Ulcerations at the distal right lower leg. Assess for osseous erosion. EXAM: RIGHT TIBIA AND FIBULA - 2 VIEW COMPARISON:  None. FINDINGS: There is no evidence of fracture or dislocation. No definite osseous erosions are seen. The tibia and fibula appear intact. The knee joint is grossly unremarkable in appearance. The ankle mortise is incompletely assessed, but appears unremarkable. The subtalar joint is within normal limits. A plantar calcaneal spur is noted. Diffuse soft tissue swelling is noted about the lower leg, with underlying soft tissue ulcerations. Scattered vascular calcifications are seen. IMPRESSION: 1. No evidence of fracture or dislocation. No definite osseous erosions seen. 2. Diffuse soft tissue swelling about the lower leg, with soft tissue ulcerations. 3. Scattered vascular calcifications seen. Electronically Signed   By: Garald Balding M.D.   On: 12/06/2018 05:54        Assessment/Plan  Cellulitis with possibly lymphangitis  She has had similar cellulitis in the past (venous stasis ulcer, progressed to cellulitis). She has no systemic symptoms/signs at present, but there is concern that she has lymphangitis, and so I agree with observation and IV antibiotics for 24 hours.    -IV cefazolin  -WOC consult  -CM consult for possible referral to wound care center as an outpatient       Venous insufficiency  The only swelling present now is  related to her cellulitis, and her overal leg swelling seems well managed.  She is intolerant of compression hose, which is unfortunate, but restricting salt.  Also treated with HCTZ.  My only other recommendation for prevention of future cellulitis would be better wound care when  venous ulcers do arise in the future: -Referral to Wound Care center as outpatient  Hypokalemia:  From chronic diarrhea -Check mag -Replete K -Repeat BMP tomorrow  Hypertension:  BP elevated -Restart home HCTZ, losartan, metoprolol  Stroke secondary prevention -Continue aspirin, Lipitor  Chronic diarrhea:  Follows with GI -Continue home anti-motility agents  Other:  -Continue home Xanax as needed       DVT prophylaxis: Lovenox  Code Status: FULL  Family Communication: Daughter at bedside  Disposition Plan: Anticipate IV antibiotics, WOC consult.  If redness improved tomorrow, home with WOC and PCP follow up Consults called: [place], CM Admission status: OBS At the point of initial evaluation, it is my clinical opinion that admission for OBSERVATION is reasonable and necessary because the patient's presenting complaints in the context of their chronic conditions represent sufficient risk of deterioration or significant morbidity to constitute reasonable grounds for close observation in the hospital setting, but that the patient may be medically stable for discharge from the hospital within 24 to 48 hours.    Medical decision making: Patient seen at 8:30 AM on 12/06/2018.  The patient was discussed with Alfredia Client, PA-C.  What exists of the patient's chart was reviewed in depth and summarized above.  Clinical condition: stable.        Edwin Dada [facility] Pager [phone]

## 2018-12-06 NOTE — Consult Note (Addendum)
[facility] Nurse wound consult note Reason for Consult: Consult requested for bilat legs. Both with generalized edema, left foot very puffy with fluid, no open wounds.  Right leg with several wounds from previous falls with various appearances. Wound type: Right upper anterior calf with intact skin, dark purple bruised area approx 2X2cm Right upper calf with intact skin, dark purple bruised area approx .3X.3cm Right middle anterior calf with 1X1cm area of eschar from a previous hematoma; 100% tightly adhered and dry, no odor, drainage, or fluctuance. Right outer calf with .5X.5cm area of eschar from a previous hematoma; 100% tightly adhered and dry, no odor, drainage, or fluctuance. Right posterior calf with partial thickness abrasion; 1X.3X.1cm, pink and moist Dressing procedure/placement/frequency: Santyl ointment to provide enzymatic debridement of nonviable tissue to anterior leg wounds.  Daughter at bedside to discuss plan of care; described topical application of ointment for use daily after discharge and they verbalized understanding.  Pt could benefit from follow-up at the outpatient wound care center after discharge for sharp debridement once the eschar has softened; this must be by physician referral and the care manager can make arrangements if this plan of care is desired.  Please re-consult if further assistance is needed.  Thank-you,  Julien Girt MSN, [place], [place], [place], [place]

## 2018-12-06 NOTE — ED Notes (Signed)
Patient transported back from X-ray 

## 2018-12-06 NOTE — ED Notes (Signed)
Bed: WA06 Expected date:  Expected time:  Means of arrival:  Comments: Bilateral leg wounds

## 2018-12-06 NOTE — ED Notes (Signed)
ED Provider at bedside. 

## 2018-12-06 NOTE — ED Notes (Signed)
Family at bedside. 

## 2018-12-06 NOTE — Care Management Obs Status (Signed)
[facility] NOTIFICATION   Patient Details  Name: Susan [name] MRN: [id number] Date of Birth: 10-10-1924   Medicare Observation Status Notification Given:  Yes    [place] Mouton, RN 12/06/2018, 4:54 PM

## 2018-12-06 NOTE — ED Notes (Signed)
Report given to Kimberly.

## 2018-12-07 DIAGNOSIS — L03116 Cellulitis of left lower limb: Secondary | ICD-10-CM | POA: Diagnosis not present

## 2018-12-07 DIAGNOSIS — K529 Noninfective gastroenteritis and colitis, unspecified: Secondary | ICD-10-CM | POA: Diagnosis not present

## 2018-12-07 DIAGNOSIS — E876 Hypokalemia: Secondary | ICD-10-CM | POA: Diagnosis not present

## 2018-12-07 LAB — BASIC METABOLIC PANEL
Anion gap: 9 (ref 5–15)
BUN: 12 mg/dL (ref 8–23)
CO2: 27 mmol/L (ref 22–32)
CREATININE: 0.55 mg/dL (ref 0.44–1.00)
Calcium: 9.1 mg/dL (ref 8.9–10.3)
Chloride: 105 mmol/L (ref 98–111)
GFR calc Af Amer: >60 mL/min (ref >60)
GFR calc non Af Amer: >60 mL/min (ref >60)
GLUCOSE: 96 mg/dL (ref 70–99)
Potassium: 4.3 mmol/L (ref 3.5–5.1)
Sodium: 141 mmol/L (ref 135–145)

## 2018-12-07 LAB — CBC
HCT: 36.1 % (ref 36.0–46.0)
Hemoglobin: 11.4 g/dL — ABNORMAL LOW (ref 12.0–15.0)
MCH: 31.2 pg (ref 26.0–34.0)
MCHC: 31.6 g/dL (ref 30.0–36.0)
MCV: 98.9 fL (ref 80.0–100.0)
Platelets: 266 10*3/uL (ref 150–400)
RBC: 3.65 MIL/uL — ABNORMAL LOW (ref 3.87–5.11)
RDW: 13.2 % (ref 11.5–15.5)
WBC: 5.7 10*3/uL (ref 4.0–10.5)
nRBC: 0 % (ref 0.0–0.2)

## 2018-12-07 MED ORDER — SODIUM CHLORIDE 0.9 % IV SOLN
INTRAVENOUS | Status: DC | PRN
  Administered 2018-12-07: 250 mL via INTRAVENOUS

## 2018-12-07 NOTE — Evaluation (Signed)
Physical Therapy Evaluation Patient Details Name: Susan [name] MRN: [id number] DOB: [birth year] Today's Date: 12/07/2018   History of Present Illness  Susan [name] is a 85 y.o. F with hx remote stroke no residuals, HTN, chronic diarrhea, and venous insufficiency who presents with right leg swelling and ulcer.  Clinical Impression  Patient presents close to functional baseline.  Currently using rollator for hallway ambulation with S to mod I.  Feel she is safe for d/c home when stable without follow up PT at this time.  No further skilled PT needs, will sign off.     Follow Up Recommendations No PT follow up    Equipment Recommendations  None recommended by PT    Recommendations for Other Services       Precautions / Restrictions Precautions Precautions: Fall Precaution Comments: managing risk using walker      Mobility  Bed Mobility Overal bed mobility: Modified Independent                Transfers Overall transfer level: Modified independent Equipment used: None                Ambulation/Gait Ambulation/Gait assistance: Modified independent (Device/Increase time) Gait Distance (Feet): 150 Feet Assistive device: 4-wheeled walker Gait Pattern/deviations: Step-through pattern;Decreased stride length     General Gait Details: no noted antalgia with gait  Stairs            Wheelchair Mobility    Modified Rankin (Stroke Patients Only)       Balance Overall balance assessment: Mild deficits observed, not formally tested                                           Pertinent Vitals/Pain Pain Assessment: Faces Faces Pain Scale: Hurts little more Pain Location: R leg ulcer Pain Descriptors / Indicators: Sore;Tender Pain Intervention(s): Monitored during session    Home Living Family/patient expects to be discharged to:: Private residence Living Arrangements: Alone   Type of Home: Independent living facility       Home  Layout: One level Home Equipment: Environmental consultant - 4 wheels      Prior Function Level of Independence: Independent with assistive device(s)         Comments: uses rollator in the ILF community, no device in her apartment     Hand Dominance        Extremity/Trunk Assessment   Upper Extremity Assessment Upper Extremity Assessment: Overall WFL for tasks assessed    Lower Extremity Assessment Lower Extremity Assessment: Overall WFL for tasks assessed       Communication   Communication: No difficulties  Cognition Arousal/Alertness: Awake/alert Behavior During Therapy: WFL for tasks assessed/performed Overall Cognitive Status: Within Functional Limits for tasks assessed                                        General Comments General comments (skin integrity, edema, etc.): demonstrated ankle pumps and leg lifts she has been doing in bed    Exercises     Assessment/Plan    PT Assessment Patent does not need any further PT services  PT Problem List         PT Treatment Interventions      PT Goals (Current goals can be found in the Care Plan section)  Acute Rehab PT Goals PT Goal Formulation: All assessment and education complete, DC therapy    Frequency     Barriers to discharge        Co-evaluation               AM-PAC PT "6 Clicks" Mobility  Outcome Measure Help needed turning from your back to your side while in a flat bed without using bedrails?: None Help needed moving from lying on your back to sitting on the side of a flat bed without using bedrails?: None Help needed moving to and from a bed to a chair (including a wheelchair)?: None Help needed standing up from a chair using your arms (e.g., wheelchair or bedside chair)?: None Help needed to walk in hospital room?: None Help needed climbing 3-5 steps with a railing? : A Little 6 Click Score: 23    End of Session   Activity Tolerance: Patient tolerated treatment well Patient  left: with call bell/phone within reach;in bed   PT Visit Diagnosis: Other abnormalities of gait and mobility (R26.89)    Time: 7782-4235 PT Time Calculation (min) (ACUTE ONLY): 18 min   Charges:   PT Evaluation $PT Eval Low Complexity: [place], [place] [facility] [phone] 12/07/2018   Reginia Naas 12/07/2018, 4:49 PM

## 2018-12-07 NOTE — Progress Notes (Signed)
PROGRESS NOTE  Ily Denno MOQ:[id number] DOB: 05/07/1924 DOA: 12/06/2018 PCP: Biagio Borg, MD  HPI/Recap of past 24 hours:  Feeling better, able to walk to the bathroom Daughter at bedside  Assessment/Plan: Principal Problem:   Cellulitis Active Problems:   Hypokalemia   Essential hypertension   Chronic diarrhea   Lymphangitis  Bilateral lower extremity cellulitis: -she report fragile skin, had h/o cellulitis in the legs after skin abrasion -report the same this time, initially on presentation with significant erythema, edema, painful , not able to walk -she is improving, less pain, able to walk today -wound care input appreciated: " Santyl ointment to provide enzymatic debridement of nonviable tissue to anterior leg wounds.  Daughter at bedside to discuss plan of care; described topical application of ointment for use daily after discharge and they verbalized understanding.  Pt could benefit from follow-up at the outpatient wound care center after discharge for sharp debridement once the eschar has softened; this must be by physician referral and the care manager can make arrangements if this plan of care is desired. "  -continue iv abx for another 24hrs, likely can change to oral abx and discharge home tomorrow  Hypokalemia: Likely from HCTZ , she also has chronic diarrhea k replaced,   HTN; continue on losartan, metoprolol, htz  Chronic diarrhea:  Follows with GI -Continue home anti-motility agents  Other:  -Continue home Xanax as needed   Code Status: full  Family Communication: patient and daughter at bedside  Disposition Plan: return to independent living , likely tomorrow   Consultants:  Wound care  Procedures:  none  Antibiotics:  As above   Objective: BP (!) 162/76 (BP Location: Left Arm)   Pulse 60   Temp 98.3 F (36.8 C) (Oral)   Resp 16   Ht 5\' 2"  (1.575 m)   Wt 49.6 kg   SpO2 96%   BMI 20.00 kg/m   Intake/Output Summary  (Last 24 hours) at 12/07/2018 1057 Last data filed at 12/07/2018 0600 Gross per 24 hour  Intake 401.19 ml  Output -  Net 401.19 ml   Filed Weights   12/06/18 0851  Weight: 49.6 kg    Exam: Patient is examined daily including today on 12/07/2018, exams remain the same as of yesterday except that has changed    General:  NAD  Cardiovascular: RRR  Respiratory: CTABL  Abdomen: Soft/ND/NT, positive BS  Musculoskeletal: less Edema, less erythema, less tender, scattered ecchymosis   Neuro: alert, oriented   Data Reviewed: Basic Metabolic Panel: Recent Labs  Lab 12/06/18 0541 12/07/18 0417  NA 140 141  K 2.9* 4.3  CL 102 105  CO2 26 27  GLUCOSE 96 96  BUN 14 12  CREATININE 0.54 0.55  CALCIUM 8.6* 9.1  MG 2.0  --    Liver Function Tests: Recent Labs  Lab 12/06/18 0541  AST 23  ALT 19  ALKPHOS 41  BILITOT 0.7  PROT 6.4*  ALBUMIN 3.4*   No results for input(s): LIPASE, AMYLASE in the last 168 hours. No results for input(s): AMMONIA in the last 168 hours. CBC: Recent Labs  Lab 12/06/18 0541 12/07/18 0417  WBC 6.3 5.7  NEUTROABS 4.1  --   HGB 13.3 11.4*  HCT 41.1 36.1  MCV 98.8 98.9  PLT 277 266   Cardiac Enzymes:   No results for input(s): CKTOTAL, CKMB, CKMBINDEX, TROPONINI in the last 168 hours. BNP (last 3 results) Recent Labs    12/06/18 0541  BNP 121.7*  ProBNP (last 3 results) No results for input(s): PROBNP in the last 8760 hours.  CBG: No results for input(s): GLUCAP in the last 168 hours.  No results found for this or any previous visit (from the past 240 hour(s)).   Studies: No results found.  Scheduled Meds: . aspirin EC  325 mg Oral Daily  . atorvastatin  10 mg Oral Daily  . cholestyramine light  4 g Oral Daily  . collagenase   Topical Daily  . dicyclomine  10 mg Oral BID  . enoxaparin (LOVENOX) injection  40 mg Subcutaneous Q24H  . famotidine  20 mg Oral BID  . hydrochlorothiazide  25 mg Oral Daily  . loratadine  10 mg  Oral Daily  . losartan  100 mg Oral Daily  . metoprolol succinate  50 mg Oral Daily    Continuous Infusions: . sodium chloride 10 mL/hr at 12/07/18 0600  .  ceFAZolin (ANCEF) IV Stopped (12/07/18 6659)     Time spent: 66mins I have personally reviewed and interpreted on  12/07/2018 daily labs,  imagings as discussed above under date review session and assessment and plans.  I reviewed all nursing notes, pharmacy notes, consultant notes,  vitals, pertinent old records  I have discussed plan of care as described above with RN , patient and family on 12/07/2018   Florencia Reasons MD, PhD  [facility] Pager [phone]. If 7PM-7AM, please contact night-coverage at [email or web address], password [email or web address] 12/07/2018, 10:57 AM  LOS: 0 days

## 2018-12-08 DIAGNOSIS — L03115 Cellulitis of right lower limb: Secondary | ICD-10-CM | POA: Diagnosis not present

## 2018-12-08 DIAGNOSIS — L03116 Cellulitis of left lower limb: Secondary | ICD-10-CM | POA: Diagnosis not present

## 2018-12-08 LAB — CBC WITH DIFFERENTIAL/PLATELET
Abs Immature Granulocytes: 0.07 10*3/uL (ref 0.00–0.07)
Basophils Absolute: 0 10*3/uL (ref 0.0–0.1)
Basophils Relative: 1 %
Eosinophils Absolute: 0.1 10*3/uL (ref 0.0–0.5)
Eosinophils Relative: 3 %
HEMATOCRIT: 35 % — AB (ref 36.0–46.0)
Hemoglobin: 10.8 g/dL — ABNORMAL LOW (ref 12.0–15.0)
Immature Granulocytes: 1 %
Lymphocytes Relative: 28 %
Lymphs Abs: 1.5 10*3/uL (ref 0.7–4.0)
MCH: 31.8 pg (ref 26.0–34.0)
MCHC: 30.9 g/dL (ref 30.0–36.0)
MCV: 102.9 fL — ABNORMAL HIGH (ref 80.0–100.0)
Monocytes Absolute: 0.7 10*3/uL (ref 0.1–1.0)
Monocytes Relative: 12 %
Neutro Abs: 3 10*3/uL (ref 1.7–7.7)
Neutrophils Relative %: 55 %
Platelets: 247 10*3/uL (ref 150–400)
RBC: 3.4 MIL/uL — ABNORMAL LOW (ref 3.87–5.11)
RDW: 13.5 % (ref 11.5–15.5)
WBC: 5.5 10*3/uL (ref 4.0–10.5)
nRBC: 0 % (ref 0.0–0.2)

## 2018-12-08 LAB — BASIC METABOLIC PANEL
Anion gap: 9 (ref 5–15)
BUN: 17 mg/dL (ref 8–23)
CO2: 27 mmol/L (ref 22–32)
Calcium: 8.9 mg/dL (ref 8.9–10.3)
Chloride: 105 mmol/L (ref 98–111)
Creatinine, Ser: 0.61 mg/dL (ref 0.44–1.00)
GFR calc Af Amer: >60 mL/min (ref >60)
GFR calc non Af Amer: >60 mL/min (ref >60)
Glucose, Bld: 105 mg/dL — ABNORMAL HIGH (ref 70–99)
Potassium: 4.5 mmol/L (ref 3.5–5.1)
Sodium: 141 mmol/L (ref 135–145)

## 2018-12-08 MED ORDER — CLINDAMYCIN HCL 300 MG PO CAPS: 300.0000 mg | ORAL_CAPSULE | Freq: Three times a day (TID) | ORAL | 0 refills | Status: AC

## 2018-12-08 MED ORDER — METOPROLOL SUCCINATE ER 25 MG PO TB24: 25.0000 mg | ORAL_TABLET | Freq: Every day | ORAL | 1 refills | Status: DC

## 2018-12-08 NOTE — Discharge Summary (Signed)
Physician Discharge Summary  Susan [name] WPY:[id number] DOB: [birth year] DOA: 12/06/2018  PCP: Biagio Borg, MD  Admit date: 12/06/2018 Discharge date: 12/08/2018  Admitted From: Independent living Disposition: Independent living  Recommendations for Outpatient Follow-up:  1. Follow up with PCP in 1-2 weeks 2. Please obtain BMP/CBC in one week   Home Health: No Equipment/Devices: Rolling walker  Discharge Condition: Stable CODE STATUS: Full Diet recommendation: Heart Healthy    Brief/Interim Summary:  #) Right greater than left lower extremity cellulitis: Patient was admitted with right greater than left lower extremity cellulitis along with pain and erythema and edema.  Patient was given IV cefazolin with improvement in pain, erythema.  Wound care was consulted and recommended outpatient follow-up with wound care clinic.  This was placed.  Santyl ointment was given for enzymatic debridement of nonviable tissue.  Patient was discharged on clindamycin for additional 7 days.  #) Hypokalemia: This was attributed to have chronic diarrhea as well as HCTZ.  This was aggressively supplemented.  #) Hypertension: HCTZ was continued.  She was noted to have borderline bradycardia and so metoprolol succinate was decreased to 25 mg daily.  Patient was continued on losartan 100 mg daily.  #) Chronic diarrhea: Patient was continued on home cholestyramine, Lomotil, loperamide.  #) History of CVA: Patient was continued on full dose aspirin.  #) Pain/psych: Patient was continued on alprazolam as needed.  Discharge Diagnoses:  Principal Problem:   Cellulitis Active Problems:   Hypokalemia   Essential hypertension   Chronic diarrhea   Lymphangitis    Discharge Instructions  Discharge Instructions    AMB referral to wound care center   Complete by:  As directed    Call MD for:  difficulty breathing, headache or visual disturbances   Complete by:  As directed    Call MD for:  extreme  fatigue   Complete by:  As directed    Call MD for:  hives   Complete by:  As directed    Call MD for:  persistant dizziness or light-headedness   Complete by:  As directed    Call MD for:  persistant nausea and vomiting   Complete by:  As directed    Call MD for:  redness, tenderness, or signs of infection (pain, swelling, redness, odor or green/yellow discharge around incision site)   Complete by:  As directed    Call MD for:  severe uncontrolled pain   Complete by:  As directed    Call MD for:  temperature >100.4   Complete by:  As directed    Diet - low sodium heart healthy   Complete by:  As directed    Discharge instructions   Complete by:  As directed    Please take your antibiotics as prescribed.  Please follow-up with your primary care doctor in 1 week.   Increase activity slowly   Complete by:  As directed      Allergies as of 12/08/2018      Reactions   Butrans [buprenorphine] Other (See Comments)   Severe chest pains   Codeine Other (See Comments)   Severe Chest pains   Morphine And Related Other (See Comments)   Severe chest pains      Medication List    TAKE these medications   acetaminophen 650 MG CR tablet Commonly known as:  TYLENOL Take 650 mg by mouth every 8 (eight) hours as needed for pain.   ALPRAZolam 0.5 MG tablet Commonly known as:  XANAX TAKE  1 TABLET BY MOUTH ONCE DAILY AT BEDTIME AS NEEDED FOR ANXIETY   aspirin EC 325 MG tablet Take 325 mg by mouth daily.   atorvastatin 10 MG tablet Commonly known as:  LIPITOR Take 1 tablet (10 mg total) by mouth daily.   cholestyramine 4 g packet Commonly known as:  QUESTRAN Take 1 packet (4 g total) by mouth daily.   clindamycin 300 MG capsule Commonly known as:  CLEOCIN Take 1 capsule (300 mg total) by mouth 3 (three) times daily for 7 days.   dicyclomine 10 MG capsule Commonly known as:  BENTYL Take 1 capsule (10 mg total) by mouth 2 (two) times daily.   diphenoxylate-atropine 2.5-0.025 MG  tablet Commonly known as:  LOMOTIL Take 1 tablet by mouth 3 (three) times daily as needed for diarrhea or loose stools.   famotidine 20 MG tablet Commonly known as:  PEPCID TAKE 1 TABLET BY MOUTH TWICE DAILY   fexofenadine 180 MG tablet Commonly known as:  ALLEGRA Take 180 mg by mouth daily.   hydrochlorothiazide 25 MG tablet Commonly known as:  HYDRODIURIL Take 1 tablet (25 mg total) by mouth daily.   IRON PO Take 65 mg by mouth 3 (three) times a week.   loperamide 2 MG capsule Commonly known as:  IMODIUM Take by mouth as needed for diarrhea or loose stools.   losartan 100 MG tablet Commonly known as:  COZAAR TAKE 1 TABLET BY MOUTH ONCE DAILY   metoprolol succinate 25 MG 24 hr tablet Commonly known as:  TOPROL-XL Take 1 tablet (25 mg total) by mouth daily. Take with or immediately following a meal. What changed:    medication strength  how much to take   OMEGA-3 FUSION Liqd Take by mouth. 2840 mg, take one teaspoon daily   Vitamin C 250 MG Chew Chew 250 mg by mouth 3 (three) times a week. 2 tab 3 time a week   Vitamin D (Ergocalciferol) 1.25 MG (50000 UT) Caps capsule Commonly known as:  DRISDOL TAKE 1 CAPSULE BY MOUTH ONCE A WEEK      Follow-up Information    Biagio Borg, MD Follow up in 1 week(s).   Specialties:  Internal Medicine, Radiology Why:  hospital discharge follow up, refer to wound care per pcp.  Contact information: [place] [address] [phone]          Allergies  Allergen Reactions  . Butrans [Buprenorphine] Other (See Comments)    Severe chest pains  . Codeine Other (See Comments)    Severe Chest pains  . Morphine And Related Other (See Comments)    Severe chest pains    Consultations:  None   Procedures/Studies: Dg Tibia/fibula Right  Result Date: 12/06/2018 CLINICAL DATA:  Ulcerations at the distal right lower leg. Assess for osseous erosion. EXAM: RIGHT TIBIA AND FIBULA - 2 VIEW COMPARISON:   None. FINDINGS: There is no evidence of fracture or dislocation. No definite osseous erosions are seen. The tibia and fibula appear intact. The knee joint is grossly unremarkable in appearance. The ankle mortise is incompletely assessed, but appears unremarkable. The subtalar joint is within normal limits. A plantar calcaneal spur is noted. Diffuse soft tissue swelling is noted about the lower leg, with underlying soft tissue ulcerations. Scattered vascular calcifications are seen. IMPRESSION: 1. No evidence of fracture or dislocation. No definite osseous erosions seen. 2. Diffuse soft tissue swelling about the lower leg, with soft tissue ulcerations. 3. Scattered vascular calcifications seen.  Electronically Signed   By: Garald Balding M.D.   On: 12/06/2018 05:54      Subjective:   Discharge Exam: Vitals:   12/07/18 2110 12/08/18 0616  BP: (!) 147/80 (!) 136/56  Pulse: (!) 58 (!) 49  Resp: 15 15  Temp: 99.1 F (37.3 C) 98.7 F (37.1 C)  SpO2: 96% 97%   Vitals:   12/07/18 0428 12/07/18 1346 12/07/18 2110 12/08/18 0616  BP: (!) 162/76 (!) 179/83 (!) 147/80 (!) 136/56  Pulse: 60 63 (!) 58 (!) 49  Resp: 16 18 15 15   Temp: 98.3 F (36.8 C) 98.3 F (36.8 C) 99.1 F (37.3 C) 98.7 F (37.1 C)  TempSrc: Oral Oral Oral Oral  SpO2: 96% 95% 96% 97%  Weight:      Height:        General: Pt is alert, awake, not in acute distress Cardiovascular: RRR, S1/S2 +, no rubs, no gallops Respiratory: CTA bilaterally, no wheezing, no rhonchi Abdominal: Soft, NT, ND, bowel sounds + Extremities: 1+ lower extremity edema up to ankle, right lower extremity erythema, chronic venous stasis changes bilaterally, no abscess    The results of significant diagnostics from this hospitalization (including imaging, microbiology, ancillary and laboratory) are listed below for reference.     Microbiology: No results found for this or any previous visit (from the past 240 hour(s)).   Labs: BNP (last 3  results) Recent Labs    12/06/18 0541  BNP 161.0*   Basic Metabolic Panel: Recent Labs  Lab 12/06/18 0541 12/07/18 0417 12/08/18 0506  NA 140 141 141  K 2.9* 4.3 4.5  CL 102 105 105  CO2 26 27 27   GLUCOSE 96 96 105*  BUN 14 12 17   CREATININE 0.54 0.55 0.61  CALCIUM 8.6* 9.1 8.9  MG 2.0  --   --    Liver Function Tests: Recent Labs  Lab 12/06/18 0541  AST 23  ALT 19  ALKPHOS 41  BILITOT 0.7  PROT 6.4*  ALBUMIN 3.4*   No results for input(s): LIPASE, AMYLASE in the last 168 hours. No results for input(s): AMMONIA in the last 168 hours. CBC: Recent Labs  Lab 12/06/18 0541 12/07/18 0417 12/08/18 0506  WBC 6.3 5.7 5.5  NEUTROABS 4.1  --  3.0  HGB 13.3 11.4* 10.8*  HCT 41.1 36.1 35.0*  MCV 98.8 98.9 102.9*  PLT 277 266 247   Cardiac Enzymes: No results for input(s): CKTOTAL, CKMB, CKMBINDEX, TROPONINI in the last 168 hours. BNP: Invalid input(s): POCBNP CBG: No results for input(s): GLUCAP in the last 168 hours. D-Dimer No results for input(s): DDIMER in the last 72 hours. Hgb A1c No results for input(s): HGBA1C in the last 72 hours. Lipid Profile No results for input(s): CHOL, HDL, LDLCALC, TRIG, CHOLHDL, LDLDIRECT in the last 72 hours. Thyroid function studies No results for input(s): TSH, T4TOTAL, T3FREE, THYROIDAB in the last 72 hours.  Invalid input(s): FREET3 Anemia work up No results for input(s): VITAMINB12, FOLATE, FERRITIN, TIBC, IRON, RETICCTPCT in the last 72 hours. Urinalysis No results found for: COLORURINE, APPEARANCEUR, LABSPEC, [place], GLUCOSEU, HGBUR, BILIRUBINUR, KETONESUR, PROTEINUR, UROBILINOGEN, NITRITE, LEUKOCYTESUR Sepsis Labs Invalid input(s): PROCALCITONIN,  WBC,  LACTICIDVEN Microbiology No results found for this or any previous visit (from the past 240 hour(s)).   Time coordinating discharge: 35  SIGNED:   Cristy Folks, MD  [facility] 12/08/2018, 10:03 AM  If 7PM-7AM, please contact  night-coverage [email or web address] Password [email or web address]

## 2018-12-08 NOTE — Discharge Instructions (Signed)

## 2018-12-08 NOTE — Progress Notes (Signed)
Discharge instructions provided to both patient Susan [name] and patient's daughter Susan [name].  Discharge instructions and prescriptions given to patient and daughter and reviewed with both via teach back method.  Patient and daughter verbalized understanding of instructions, prescriptions and follow-up appointments.  Will continue to monitor.

## 2018-12-09 ENCOUNTER — Telehealth: Payer: Self-pay | Admitting: *Deleted

## 2018-12-09 ENCOUNTER — Ambulatory Visit (INDEPENDENT_AMBULATORY_CARE_PROVIDER_SITE_OTHER): Payer: Medicare Other | Admitting: Internal Medicine

## 2018-12-09 ENCOUNTER — Encounter: Payer: Self-pay | Admitting: Internal Medicine

## 2018-12-09 VITALS — BP 152/90 | HR 86 | Temp 98.1°F | Ht 62.0 in | Wt 113.0 lb

## 2018-12-09 DIAGNOSIS — I10 Essential (primary) hypertension: Secondary | ICD-10-CM | POA: Diagnosis not present

## 2018-12-09 DIAGNOSIS — K529 Noninfective gastroenteritis and colitis, unspecified: Secondary | ICD-10-CM | POA: Diagnosis not present

## 2018-12-09 DIAGNOSIS — Z872 Personal history of diseases of the skin and subcutaneous tissue: Secondary | ICD-10-CM | POA: Diagnosis not present

## 2018-12-09 DIAGNOSIS — L03115 Cellulitis of right lower limb: Secondary | ICD-10-CM

## 2018-12-09 DIAGNOSIS — D62 Acute posthemorrhagic anemia: Secondary | ICD-10-CM | POA: Diagnosis not present

## 2018-12-09 DIAGNOSIS — L03119 Cellulitis of unspecified part of limb: Secondary | ICD-10-CM | POA: Insufficient documentation

## 2018-12-09 DIAGNOSIS — E876 Hypokalemia: Secondary | ICD-10-CM | POA: Diagnosis not present

## 2018-12-09 DIAGNOSIS — S81801D Unspecified open wound, right lower leg, subsequent encounter: Secondary | ICD-10-CM

## 2018-12-09 DIAGNOSIS — S81801A Unspecified open wound, right lower leg, initial encounter: Secondary | ICD-10-CM | POA: Insufficient documentation

## 2018-12-09 MED ORDER — CHOLESTYRAMINE 4 G PO PACK: 4.0000 g | PACK | Freq: Every day | ORAL | 3 refills | Status: DC

## 2018-12-09 MED ORDER — HYDROCHLOROTHIAZIDE 12.5 MG PO CAPS: 12.5000 mg | ORAL_CAPSULE | Freq: Every day | ORAL | 3 refills | Status: DC

## 2018-12-09 NOTE — Assessment & Plan Note (Signed)
Also for wound clinic referral

## 2018-12-09 NOTE — Assessment & Plan Note (Signed)
Mild to mod elevated, ok to restart the hct 12.5 qd

## 2018-12-09 NOTE — Assessment & Plan Note (Signed)
For f/u lab today,  to f/u any worsening symptoms or concerns 

## 2018-12-09 NOTE — Assessment & Plan Note (Signed)
Resolved, to finish cleocin asd

## 2018-12-09 NOTE — Patient Instructions (Signed)
OK to restart the HCT at 12.5 mg daily  Please check your blood pressure as you do, and let us know the results in 1-2 weeks  Please continue all other medications as before including the cleocin antibiotic, and refills have been done if requested - the Questran  Please have the pharmacy call with any other refills you may need.  Please keep your appointments with your specialists as you may have planned  You will be contacted regarding the referral for: Wound clinic  Please go to the LAB in the Basement (turn left off the elevator) for the tests to be done in 1-2 weeks  You will be contacted by phone if any changes need to be made immediately.  Otherwise, you will receive a letter about your results with an explanation, but please check with MyChart first.  Please remember to sign up for MyChart if you have not done so, as this will be important to you in the future with finding out test results, communicating by private email, and scheduling acute appointments online when needed.  Please return in 3 months, or sooner if needed

## 2018-12-09 NOTE — Telephone Encounter (Signed)
Called pt to verify appt that's made for today @ 1:40. Pt states she had made appt back last month for follow-up, but while in the hospital the MD there change her medication then. She states she was told to make appt w/dr. Jenny Reichmann in a week when she was discharge. Inform pt will change appt to hosp follow-up, also had some additional questions concerning discharge. Completed TCM call below.Johny Chess  Transition Care Management Follow-up Telephone Call   Date discharged? 12/08/18   How have you been since you were released from the hospital? Pt states she is doing much better   Do you understand why you were in the hospital? YES   Do you understand the discharge instructions? YES   Where were you discharged to? Home   Items Reviewed:  Medications reviewed: YES, pt states hosp provider change her to Metoprolol 25 mg once a day now  Allergies reviewed: YES  Dietary changes reviewed: YES, heart healthy  Referrals reviewed: YES, still waiting on appt w/wound care   Functional Questionnaire:   Activities of Daily Living (ADLs):   She states she are independent in the following: bathing and hygiene, feeding, continence, grooming, toileting and dressing States she require assistance with the following: ambulation   Any transportation issues/concerns?: NO   Any patient concerns? NO   Confirmed importance and date/time of follow-up visits scheduled YES, appt 12/09/18  Provider Appointment booked with Dr. Jenny Reichmann  Confirmed with patient if condition begins to worsen call PCP or go to the ER.  Patient was given the office number and encouraged to call back with question or concerns.  : YES

## 2018-12-09 NOTE — Assessment & Plan Note (Signed)
For recheck BMP with next labs

## 2018-12-09 NOTE — Assessment & Plan Note (Signed)
Stable, to cont questran, lomotil prn

## 2018-12-09 NOTE — Progress Notes (Signed)
Subjective:    Patient ID: Susan [name], female    DOB: [birth year], 85 y.o.   MRN: [id number]  HPI  Here to f/u recent hospn with daughter 1/3 - 77 with  Right greater than left lower extremity cellulitis: Patient was admitted with right greater than left lower extremity cellulitis along with pain and erythema and edema.  Patient was given IV cefazolin with improvement in pain, erythema.  Wound care was consulted and recommended outpatient follow-up with wound care clinic.  This was placed.  Santyl ointment was given for enzymatic debridement of nonviable tissue.  Patient was discharged on clindamycin for additional 7 days.  Also -  Hypokalemia: This was attributed to have chronic diarrhea as well as HCTZ.  This was aggressively supplemented.   Hypertension: HCTZ was continued.  She was noted to have borderline bradycardia and so metoprolol succinate was decreased to 25 mg daily.  Patient was continued on losartan 100 mg daily.  Also with IBS - Chronic diarrhea: Patient was continued on home cholestyramine, Lomotil, loperamide, but needs questran refill.  Right leg wound not yet healed, and now small swelling has returned to RLE, better with elevaiont, and could not tolerate compression stockings in past.  No worsening diarrhea on cleocin so far Past Medical History:  Diagnosis Date  . Actinic keratosis, hx of    chest wall (2012)  . Allergic rhinitis   . Anxiety   . Arthritis    "spine, hips" (01/30/2017)  . Bursitis   . Cellulitis of right foot 01/30/2017  . Colon polyps   . Diverticulosis   . Esophageal dysmotilities 10/16/2017  . Fibromyalgia    "qwhere" (01/30/2017)  . Gallstones   . GERD (gastroesophageal reflux disease)   . High cholesterol   . History of kidney stones   . History of stomach ulcers   . Hx of squamous cell carcinoma excision    right mandible (2011), right arm (2014)  . Hypertension   . IBS (irritable bowel syndrome)   . Osteoarthritis   . Pneumonia 2000s X 2   "twice" (01/30/2017)  . Pyloric stenosis   . Stroke [facility]) 10/2014   denies residual on 01/30/2017   Past Surgical History:  Procedure Laterality Date  . APPENDECTOMY    . CARDIOVASCULAR STRESS TEST  04/2016   nuclear cardiolite stress test done in [place] (no ischemia, no LVH, no LV systolic function)  . CATARACT EXTRACTION W/ INTRAOCULAR LENS  IMPLANT, BILATERAL Bilateral   . CHOLECYSTECTOMY OPEN    . COLONOSCOPY  2009, 2014  . DIAGNOSTIC MAMMOGRAM  2009, 2012  . TONSILLECTOMY      reports that she has never smoked. She has never used smokeless tobacco. She reports current alcohol use. She reports that she does not use drugs. family history includes Heart attack in her father, maternal grandfather, maternal grandmother, mother, paternal grandfather, and paternal grandmother; Stroke in her father, maternal grandfather, maternal grandmother, mother, paternal grandfather, and paternal grandmother. Allergies  Allergen Reactions  . Butrans [Buprenorphine] Other (See Comments)    Severe chest pains  . Codeine Other (See Comments)    Severe Chest pains  . Morphine And Related Other (See Comments)    Severe chest pains   Current Outpatient Medications on File Prior to Visit  Medication Sig Dispense Refill  . acetaminophen (TYLENOL) 650 MG CR tablet Take 650 mg by mouth every 8 (eight) hours as needed for pain.    Marland Kitchen ALPRAZolam (XANAX) 0.5 MG tablet TAKE 1 TABLET BY  MOUTH ONCE DAILY AT BEDTIME AS NEEDED FOR ANXIETY 30 tablet 2  . Ascorbic Acid (VITAMIN C) 250 MG CHEW Chew 250 mg by mouth 3 (three) times a week. 2 tab 3 time a week    . aspirin EC 325 MG tablet Take 325 mg by mouth daily.    Marland Kitchen atorvastatin (LIPITOR) 10 MG tablet Take 1 tablet (10 mg total) by mouth daily. 90 tablet 1  . clindamycin (CLEOCIN) 300 MG capsule Take 1 capsule (300 mg total) by mouth 3 (three) times daily for 7 days. 21 capsule 0  . dicyclomine (BENTYL) 10 MG capsule Take 1 capsule (10 mg total) by mouth 2 (two)  times daily. 60 capsule 3  . diphenoxylate-atropine (LOMOTIL) 2.5-0.025 MG tablet Take 1 tablet by mouth 3 (three) times daily as needed for diarrhea or loose stools. 90 tablet 1  . famotidine (PEPCID) 20 MG tablet TAKE 1 TABLET BY MOUTH TWICE DAILY 180 tablet 1  . fexofenadine (ALLEGRA) 180 MG tablet Take 180 mg by mouth daily.    . IRON PO Take 65 mg by mouth 3 (three) times a week.    . loperamide (IMODIUM) 2 MG capsule Take by mouth as needed for diarrhea or loose stools.    Marland Kitchen losartan (COZAAR) 100 MG tablet TAKE 1 TABLET BY MOUTH ONCE DAILY 90 tablet 0  . metoprolol succinate (TOPROL-XL) 25 MG 24 hr tablet Take 1 tablet (25 mg total) by mouth daily. Take with or immediately following a meal. 30 tablet 1  . Omega 3-6-9 Fatty Acids (OMEGA-3 FUSION) LIQD Take by mouth. 2840 mg, take one teaspoon daily    . Vitamin D, Ergocalciferol, (DRISDOL) 1.25 MG (50000 UT) CAPS capsule TAKE 1 CAPSULE BY MOUTH ONCE A WEEK 12 capsule 0   No current facility-administered medications on file prior to visit.    Review of Systems  Constitutional: Negative for other unusual diaphoresis or sweats HENT: Negative for ear discharge or swelling Eyes: Negative for other worsening visual disturbances Respiratory: Negative for stridor or other swelling  Gastrointestinal: Negative for worsening distension or other blood Genitourinary: Negative for retention or other urinary change Musculoskeletal: Negative for other MSK pain or swelling Skin: Negative for color change or other new lesions Neurological: Negative for worsening tremors and other numbness  Psychiatric/Behavioral: Negative for worsening agitation or other fatigue All other system neg per pt    Objective:   Physical Exam BP (!) 152/90   Pulse 86   Temp 98.1 F (36.7 C) (Oral)   Ht 5\' 2"  (1.575 m)   Wt 113 lb (51.3 kg)   SpO2 97%   BMI 20.67 kg/m  VS noted,  Constitutional: Pt appears in NAD HENT: Head: NCAT.  Right Ear: External ear normal.    Left Ear: External ear normal.  Eyes: . Pupils are equal, round, and reactive to light. Conjunctivae and EOM are normal Nose: without d/c or deformity Neck: Neck supple. Gross normal ROM Cardiovascular: Normal rate and regular rhythm.   Pulmonary/Chest: Effort normal and breath sounds without rales or wheezing.  Abd:  Soft, NT, ND, + BS, no organomegaly Neurological: Pt is alert. At baseline orientation, motor grossly intact Skin: Skin is warm. No rashes, other new lesions, trace RLE edema with superfical venous ulcer type wounds to right distal anterior leg Psychiatric: Pt behavior is normal without agitation  No other exam findings Lab Results  Component Value Date   WBC 5.5 12/08/2018   HGB 10.8 (L) 12/08/2018   HCT 35.0 (  L) 12/08/2018   PLT 247 12/08/2018   GLUCOSE 105 (H) 12/08/2018   CHOL 227 (H) 05/01/2018   TRIG 213.0 (H) 05/01/2018   HDL 80.10 05/01/2018   LDLDIRECT 102.0 05/01/2018   LDLCALC 104 (H) 10/16/2017   ALT 19 12/06/2018   AST 23 12/06/2018   NA 141 12/08/2018   K 4.5 12/08/2018   CL 105 12/08/2018   CREATININE 0.61 12/08/2018   BUN 17 12/08/2018   CO2 27 12/08/2018   TSH 1.77 10/16/2017   HGBA1C 5.6 05/01/2018       Assessment & Plan:

## 2018-12-10 NOTE — Telephone Encounter (Signed)
There is not really an exact equivalent.  Was that for the generic, cholestyramine?  There is a pill form but I find it does not work as well and I think that we discussed that she has issues with swallowing pills anyway--it is a larger sized pill as well, I believe.

## 2018-12-20 ENCOUNTER — Encounter (HOSPITAL_BASED_OUTPATIENT_CLINIC_OR_DEPARTMENT_OTHER): Payer: Medicare Other | Attending: Internal Medicine

## 2018-12-20 ENCOUNTER — Encounter (HOSPITAL_BASED_OUTPATIENT_CLINIC_OR_DEPARTMENT_OTHER): Payer: Self-pay

## 2018-12-20 ENCOUNTER — Encounter: Payer: Self-pay | Admitting: Family Medicine

## 2018-12-20 DIAGNOSIS — I87311 Chronic venous hypertension (idiopathic) with ulcer of right lower extremity: Secondary | ICD-10-CM | POA: Diagnosis not present

## 2018-12-20 DIAGNOSIS — L97812 Non-pressure chronic ulcer of other part of right lower leg with fat layer exposed: Secondary | ICD-10-CM | POA: Diagnosis not present

## 2018-12-20 DIAGNOSIS — I87331 Chronic venous hypertension (idiopathic) with ulcer and inflammation of right lower extremity: Secondary | ICD-10-CM | POA: Diagnosis not present

## 2018-12-20 DIAGNOSIS — I87322 Chronic venous hypertension (idiopathic) with inflammation of left lower extremity: Secondary | ICD-10-CM | POA: Diagnosis not present

## 2018-12-20 DIAGNOSIS — S81801A Unspecified open wound, right lower leg, initial encounter: Secondary | ICD-10-CM | POA: Diagnosis not present

## 2018-12-20 DIAGNOSIS — L97212 Non-pressure chronic ulcer of right calf with fat layer exposed: Secondary | ICD-10-CM | POA: Diagnosis not present

## 2018-12-24 ENCOUNTER — Other Ambulatory Visit (INDEPENDENT_AMBULATORY_CARE_PROVIDER_SITE_OTHER): Payer: Medicare Other

## 2018-12-24 DIAGNOSIS — D62 Acute posthemorrhagic anemia: Secondary | ICD-10-CM

## 2018-12-24 DIAGNOSIS — E876 Hypokalemia: Secondary | ICD-10-CM

## 2018-12-24 LAB — BASIC METABOLIC PANEL
BUN: 10 mg/dL (ref 6–23)
CO2: 30 mEq/L (ref 19–32)
Calcium: 9.8 mg/dL (ref 8.4–10.5)
Chloride: 101 mEq/L (ref 96–112)
Creatinine, Ser: 0.55 mg/dL (ref 0.40–1.20)
GFR: 103.36 mL/min (ref >60.00)
Glucose, Bld: 109 mg/dL — ABNORMAL HIGH (ref 70–99)
Potassium: 4.1 mEq/L (ref 3.5–5.1)
SODIUM: 140 meq/L (ref 135–145)

## 2018-12-24 LAB — CBC WITH DIFFERENTIAL/PLATELET
Basophils Absolute: 0 10*3/uL (ref 0.0–0.1)
Basophils Relative: 0.5 % (ref 0.0–3.0)
Eosinophils Absolute: 0.1 10*3/uL (ref 0.0–0.7)
Eosinophils Relative: 0.8 % (ref 0.0–5.0)
HCT: 40 % (ref 36.0–46.0)
Hemoglobin: 13.3 g/dL (ref 12.0–15.0)
LYMPHS PCT: 19.3 % (ref 12.0–46.0)
Lymphs Abs: 1.7 10*3/uL (ref 0.7–4.0)
MCHC: 33.3 g/dL (ref 30.0–36.0)
MCV: 95.4 fl (ref 78.0–100.0)
MONOS PCT: 8.6 % (ref 3.0–12.0)
Monocytes Absolute: 0.8 10*3/uL (ref 0.1–1.0)
Neutro Abs: 6.4 10*3/uL (ref 1.4–7.7)
Neutrophils Relative %: 70.8 % (ref 43.0–77.0)
Platelets: 328 10*3/uL (ref 150.0–400.0)
RBC: 4.19 Mil/uL (ref 3.87–5.11)
RDW: 13.6 % (ref 11.5–15.5)
WBC: 9 10*3/uL (ref 4.0–10.5)

## 2018-12-25 DIAGNOSIS — I87331 Chronic venous hypertension (idiopathic) with ulcer and inflammation of right lower extremity: Secondary | ICD-10-CM | POA: Diagnosis not present

## 2018-12-25 DIAGNOSIS — I10 Essential (primary) hypertension: Secondary | ICD-10-CM | POA: Diagnosis not present

## 2018-12-25 DIAGNOSIS — L97811 Non-pressure chronic ulcer of other part of right lower leg limited to breakdown of skin: Secondary | ICD-10-CM | POA: Diagnosis not present

## 2018-12-25 DIAGNOSIS — I69398 Other sequelae of cerebral infarction: Secondary | ICD-10-CM | POA: Diagnosis not present

## 2018-12-25 DIAGNOSIS — I87322 Chronic venous hypertension (idiopathic) with inflammation of left lower extremity: Secondary | ICD-10-CM | POA: Diagnosis not present

## 2018-12-25 DIAGNOSIS — Z9181 History of falling: Secondary | ICD-10-CM | POA: Diagnosis not present

## 2018-12-25 DIAGNOSIS — L03115 Cellulitis of right lower limb: Secondary | ICD-10-CM | POA: Diagnosis not present

## 2018-12-25 DIAGNOSIS — Z7982 Long term (current) use of aspirin: Secondary | ICD-10-CM | POA: Diagnosis not present

## 2018-12-25 DIAGNOSIS — L89890 Pressure ulcer of other site, unstageable: Secondary | ICD-10-CM | POA: Diagnosis not present

## 2018-12-25 DIAGNOSIS — M6281 Muscle weakness (generalized): Secondary | ICD-10-CM | POA: Diagnosis not present

## 2018-12-26 DIAGNOSIS — L97812 Non-pressure chronic ulcer of other part of right lower leg with fat layer exposed: Secondary | ICD-10-CM | POA: Diagnosis not present

## 2018-12-26 DIAGNOSIS — I87331 Chronic venous hypertension (idiopathic) with ulcer and inflammation of right lower extremity: Secondary | ICD-10-CM | POA: Diagnosis not present

## 2018-12-26 DIAGNOSIS — I87322 Chronic venous hypertension (idiopathic) with inflammation of left lower extremity: Secondary | ICD-10-CM | POA: Diagnosis not present

## 2018-12-26 DIAGNOSIS — L03115 Cellulitis of right lower limb: Secondary | ICD-10-CM | POA: Diagnosis not present

## 2018-12-26 DIAGNOSIS — L97811 Non-pressure chronic ulcer of other part of right lower leg limited to breakdown of skin: Secondary | ICD-10-CM | POA: Diagnosis not present

## 2018-12-26 DIAGNOSIS — L97211 Non-pressure chronic ulcer of right calf limited to breakdown of skin: Secondary | ICD-10-CM | POA: Diagnosis not present

## 2018-12-30 ENCOUNTER — Telehealth: Payer: Self-pay | Admitting: Internal Medicine

## 2018-12-30 DIAGNOSIS — I87322 Chronic venous hypertension (idiopathic) with inflammation of left lower extremity: Secondary | ICD-10-CM | POA: Diagnosis not present

## 2018-12-30 DIAGNOSIS — L03115 Cellulitis of right lower limb: Secondary | ICD-10-CM | POA: Diagnosis not present

## 2018-12-30 DIAGNOSIS — I10 Essential (primary) hypertension: Secondary | ICD-10-CM | POA: Diagnosis not present

## 2018-12-30 DIAGNOSIS — L89890 Pressure ulcer of other site, unstageable: Secondary | ICD-10-CM | POA: Diagnosis not present

## 2018-12-30 DIAGNOSIS — L97811 Non-pressure chronic ulcer of other part of right lower leg limited to breakdown of skin: Secondary | ICD-10-CM | POA: Diagnosis not present

## 2018-12-30 DIAGNOSIS — I87331 Chronic venous hypertension (idiopathic) with ulcer and inflammation of right lower extremity: Secondary | ICD-10-CM | POA: Diagnosis not present

## 2018-12-30 NOTE — Telephone Encounter (Signed)
This encounter was created in error - please disregard.

## 2018-12-30 NOTE — Telephone Encounter (Signed)
Incoming call  Call  Incoming call from  Home health nurse  With Phillis Knack  [phone])   Who states that  Patient has a bottle of   HCTZ which  Reads.   12.5mg .  Discharge medication lists  Stats that it should be listed as 25mg .  Home health wanted make the office aware.  Patent is asymptomatic.  Patient is on Cozar  100mg  and  Torpol 25  Mg.aso

## 2018-12-31 NOTE — Progress Notes (Signed)
[name] [address] [place], [place] [address] Phone: [phone] Subjective:    I [name] am serving as a Education administrator for Dr. Hulan Saas.   CC: Bilateral hip pain  ACZ:[id number]  Susan [name] is a 85 y.o. female coming in with complaint of bilateral hip pain. States that she is not doing well. Bilateral hip injections.  Patient has known greater trochanteric arthritis.  Does have some atrophy though from the previous injections.  Patient though states since the only thing that seems to help her pain.  Patient differential includes more lumbar radiculopathy but patient has declined any type of epidural injections.  Continues to walk with the aid of a walker.  Does not want to take anymore medications and feels like the injections are the only thing that are helpful.  Reviewing patient's chart patient was previously in the hospital for a cellulitis.  Recently treated.  Patient is being treated in the wound clear for the cellulitis of the lower extremities.     Past Medical History:  Diagnosis Date  . Actinic keratosis, hx of    chest wall (2012)  . Allergic rhinitis   . Anxiety   . Arthritis    "spine, hips" (01/30/2017)  . Bursitis   . Cellulitis of right foot 01/30/2017  . Colon polyps   . Diverticulosis   . Esophageal dysmotilities 10/16/2017  . Fibromyalgia    "qwhere" (01/30/2017)  . Gallstones   . GERD (gastroesophageal reflux disease)   . High cholesterol   . History of kidney stones   . History of stomach ulcers   . Hx of squamous cell carcinoma excision    right mandible (2011), right arm (2014)  . Hypertension   . IBS (irritable bowel syndrome)   . Osteoarthritis   . Pneumonia 2000s X 2   "twice" (01/30/2017)  . Pyloric stenosis   . Stroke [facility]) 10/2014   denies residual on 01/30/2017   Past Surgical History:  Procedure Laterality Date  . APPENDECTOMY    . CARDIOVASCULAR STRESS TEST  04/2016   nuclear cardiolite stress  test done in [place] (no ischemia, no LVH, no LV systolic function)  . CATARACT EXTRACTION W/ INTRAOCULAR LENS  IMPLANT, BILATERAL Bilateral   . CHOLECYSTECTOMY OPEN    . COLONOSCOPY  2009, 2014  . DIAGNOSTIC MAMMOGRAM  2009, 2012  . TONSILLECTOMY     Social History   Socioeconomic History  . Marital status: Widowed    Spouse name: Not on file  . Number of children: 4  . Years of education: Not on file  . Highest education level: Not on file  Occupational History  . Occupation: retired  Scientific laboratory technician  . Financial resource strain: Not on file  . Food insecurity:    Worry: Not on file    Inability: Not on file  . Transportation needs:    Medical: Not on file    Non-medical: Not on file  Tobacco Use  . Smoking status: Never Smoker  . Smokeless tobacco: Never Used  Substance and Sexual Activity  . Alcohol use: Yes    Comment: glass of red wine maybe once week  . Drug use: No  . Sexual activity: Not on file  Lifestyle  . Physical activity:    Days per week: Not on file    Minutes per session: Not on file  . Stress: Not on file  Relationships  . Social connections:    Talks on phone: Not on  file    Gets together: Not on file    Attends religious service: Not on file    Active member of club or organization: Not on file    Attends meetings of clubs or organizations: Not on file    Relationship status: Not on file  Other Topics Concern  . Not on file  Social History Narrative  . Not on file   Allergies  Allergen Reactions  . Butrans [Buprenorphine] Other (See Comments)    Severe chest pains  . Codeine Other (See Comments)    Severe Chest pains  . Morphine And Related Other (See Comments)    Severe chest pains   Family History  Problem Relation Age of Onset  . Stroke Mother   . Heart attack Mother   . Stroke Father   . Heart attack Father   . Heart attack Maternal Grandmother   . Stroke Maternal Grandmother   . Stroke Paternal Grandmother   . Heart attack  Paternal Grandmother   . Heart attack Maternal Grandfather   . Stroke Maternal Grandfather   . Stroke Paternal Grandfather   . Heart attack Paternal Grandfather   . Colon cancer Neg Hx   . Pancreatic cancer Neg Hx   . Stomach cancer Neg Hx   . Esophageal cancer Neg Hx      Current Outpatient Medications (Cardiovascular):  .  atorvastatin (LIPITOR) 10 MG tablet, Take 1 tablet (10 mg total) by mouth daily. .  cholestyramine (QUESTRAN) 4 g packet, Take 1 packet (4 g total) by mouth daily. .  hydrochlorothiazide (MICROZIDE) 12.5 MG capsule, Take 1 capsule (12.5 mg total) by mouth daily. Marland Kitchen  losartan (COZAAR) 100 MG tablet, TAKE 1 TABLET BY MOUTH ONCE DAILY .  metoprolol succinate (TOPROL-XL) 25 MG 24 hr tablet, Take 1 tablet (25 mg total) by mouth daily. Take with or immediately following a meal.  Current Outpatient Medications (Respiratory):  .  fexofenadine (ALLEGRA) 180 MG tablet, Take 180 mg by mouth daily.  Current Outpatient Medications (Analgesics):  .  acetaminophen (TYLENOL) 650 MG CR tablet, Take 650 mg by mouth every 8 (eight) hours as needed for pain. Marland Kitchen  aspirin EC 325 MG tablet, Take 325 mg by mouth daily.  Current Outpatient Medications (Hematological):  Marland Kitchen  IRON PO, Take 65 mg by mouth 3 (three) times a week.  Current Outpatient Medications (Other):  Marland Kitchen  ALPRAZolam (XANAX) 0.5 MG tablet, TAKE 1 TABLET BY MOUTH ONCE DAILY AT BEDTIME AS NEEDED FOR ANXIETY .  Ascorbic Acid (VITAMIN C) 250 MG CHEW, Chew 250 mg by mouth 3 (three) times a week. 2 tab 3 time a week .  dicyclomine (BENTYL) 10 MG capsule, Take 1 capsule (10 mg total) by mouth 2 (two) times daily. .  diphenoxylate-atropine (LOMOTIL) 2.5-0.025 MG tablet, Take 1 tablet by mouth 3 (three) times daily as needed for diarrhea or loose stools. .  famotidine (PEPCID) 20 MG tablet, TAKE 1 TABLET BY MOUTH TWICE DAILY .  loperamide (IMODIUM) 2 MG capsule, Take by mouth as needed for diarrhea or loose stools. .  Omega 3-6-9  Fatty Acids (OMEGA-3 FUSION) LIQD, Take by mouth. 2840 mg, take one teaspoon daily .  Vitamin D, Ergocalciferol, (DRISDOL) 1.25 MG (50000 UT) CAPS capsule, TAKE 1 CAPSULE BY MOUTH ONCE A WEEK    Past medical history, social, surgical and family history all reviewed in electronic medical record.  No pertanent information unless stated regarding to the chief complaint.   Review of Systems:  No  headache, visual changes, nausea, vomiting, diarrhea, constipation, dizziness, abdominal pain, skin rash, fevers, chills, night sweats, weight loss, swollen lymph nodes, body aches, joint swelling,  chest pain, shortness of breath, mood changes.  Positive muscle aches  Objective  Blood pressure 128/84, pulse 74, height 5\' 2"  (1.575 m), weight 107 lb (48.5 kg), SpO2 96 %.   General: No apparent distress alert and oriented x3 mood and affect normal, dressed appropriately.  HEENT: Pupils equal, extraocular movements intact  Respiratory: Patient's speak in full sentences and does not appear short of breath  extremities or on axial skeleton.  Abdomen: Soft nontender  Neuro: Cranial nerves II through XII are intact, neurovascularly intact in all extremities with 2+ DTRs   Gait walks with the aid of a walker MSK:  Non tender with full range of motion and good stability and symmetric strength and tone of shoulders, elbows, wrist, , knee and ankles bilaterally.  Patient's back exam shows the patient is unable to even get to neutral position in a flexed position.  Patient does have severe tenderness to palpation over the greater trochanteric areas bilaterally.  No erythema.  Right hip does have some atrophy from the musculature.  Patient has 4 out of 5 strength noted.     After verbal consent patient was prepped with alcohol swabs and with a 21-gauge 2 inch needle was injected in the right greater trochanteric area with 1 cc of 0.5% Marcaine and 1 cc of Kenalog 40 mg/mL  After verbal consent patient was prepped  with alcohol swabs and injected with a 21-gauge 2 inch needle 1 cc of Kenalog 40 mg/mL and 1 cc of 0.5% Marcaine.  Tolerated the procedure well this was in the left greater trochanteric area.  Postinjection instructions given Impression and Recommendations:     This case required medical decision making of moderate complexity. The above documentation has been reviewed and is accurate and complete Lyndal Pulley, DO       Note: This dictation was prepared with Dragon dictation along with smaller phrase technology. Any transcriptional errors that result from this process are unintentional.

## 2019-01-01 ENCOUNTER — Encounter: Payer: Self-pay | Admitting: Family Medicine

## 2019-01-01 ENCOUNTER — Ambulatory Visit (INDEPENDENT_AMBULATORY_CARE_PROVIDER_SITE_OTHER): Payer: Medicare Other | Admitting: Family Medicine

## 2019-01-01 DIAGNOSIS — M7061 Trochanteric bursitis, right hip: Secondary | ICD-10-CM | POA: Diagnosis not present

## 2019-01-01 DIAGNOSIS — L03115 Cellulitis of right lower limb: Secondary | ICD-10-CM | POA: Diagnosis not present

## 2019-01-01 DIAGNOSIS — M7062 Trochanteric bursitis, left hip: Secondary | ICD-10-CM

## 2019-01-01 DIAGNOSIS — I10 Essential (primary) hypertension: Secondary | ICD-10-CM | POA: Diagnosis not present

## 2019-01-01 DIAGNOSIS — I87322 Chronic venous hypertension (idiopathic) with inflammation of left lower extremity: Secondary | ICD-10-CM | POA: Diagnosis not present

## 2019-01-01 DIAGNOSIS — I87331 Chronic venous hypertension (idiopathic) with ulcer and inflammation of right lower extremity: Secondary | ICD-10-CM | POA: Diagnosis not present

## 2019-01-01 DIAGNOSIS — L89890 Pressure ulcer of other site, unstageable: Secondary | ICD-10-CM | POA: Diagnosis not present

## 2019-01-01 DIAGNOSIS — L97811 Non-pressure chronic ulcer of other part of right lower leg limited to breakdown of skin: Secondary | ICD-10-CM | POA: Diagnosis not present

## 2019-01-01 NOTE — Telephone Encounter (Signed)
Kim from [facility] called and reported patients BP reading today was 152/82. BP has improved.   Nurse was confused that the discharge from the hospital said HTZ 25mg  and I notified her that on 1/6 John changed it to 12.5.   Call back if any change need to be made.   Call back [phone]

## 2019-01-01 NOTE — Assessment & Plan Note (Signed)
Repeat injection given today.  We discussed different medications but because patient is a fall risk she does not want to start on gabapentin.  Concern for more of a lumbar radiculopathy that could be contributing.  Once again discussed the possibility of imaging which patient declined.  Discussed icing regimen and home exercises.  Discussed which activities to do which wants to avoid.  Follow-up again in  8 weeks can repeat injections to help patient with pain.

## 2019-01-01 NOTE — Patient Instructions (Signed)
Good to see you  Susan [name] is your friend Stay active As long as this continues to work see me again in 2 months

## 2019-01-02 ENCOUNTER — Ambulatory Visit: Payer: Medicare Other | Admitting: Gastroenterology

## 2019-01-02 ENCOUNTER — Ambulatory Visit (INDEPENDENT_AMBULATORY_CARE_PROVIDER_SITE_OTHER): Payer: Medicare Other | Admitting: Gastroenterology

## 2019-01-02 ENCOUNTER — Encounter: Payer: Self-pay | Admitting: Gastroenterology

## 2019-01-02 VITALS — BP 150/70 | HR 68 | Ht 61.0 in | Wt 108.4 lb

## 2019-01-02 DIAGNOSIS — K529 Noninfective gastroenteritis and colitis, unspecified: Secondary | ICD-10-CM

## 2019-01-02 NOTE — Patient Instructions (Addendum)
If you are age 83 or older, your body mass index should be between 23-30. Your Body mass index is 20.48 kg/m. If this is out of the aforementioned range listed, please consider follow up with your Primary Care Provider.  If you are age 48 or younger, your body mass index should be between 19-25. Your Body mass index is 20.48 kg/m. If this is out of the aformentioned range listed, please consider follow up with your Primary Care Provider.   Increase Questran to 2 packs daily.  Try to discontinue Imodium and Lomotil.  Follow up with Dr. Rush Landmark on January 24, 2019  at 3:30 pm.  Thank you for choosing me and [facility].   Alonza Bogus, PA-C

## 2019-01-02 NOTE — Progress Notes (Signed)
01/02/2019 Susan [name] [id number] March 02, 1924   HISTORY OF PRESENT ILLNESS: This is a pleasant 85 year old female who is here for follow-up of her chronic intermittent diarrhea.  I saw her on December 03, 2018 at which time she is accompanied by her daughter.  Please see my note from that date for further details regarding this issue.  She actually had previously seen Dr. Henrene Pastor for complaints of reflux a couple of years ago, but I did not realize this and reassigned her care to Dr. Rush Landmark.  Hopefully he will graciously continue to give his input on her diarrhea issue.  Anyway, at her last visit we decided to try Questran powder once daily and then to use Imodium or Lomotil as needed.  She says that she is using the Questran daily and also either uses 1 Lomotil or 1 Imodium daily as well.  Celiac labs were negative.  She has been on a lactose-free diet since that time, but does not feel that that has had a great impact on her symptoms.  Overall, however, she has noticed improvement in her symptoms.  Usually has 1-2 bowel movements daily.  Sometimes they are loose and sometimes they are little bit more formed.  She is still using the Bentyl twice daily as well.  She says that she mentioned about the losartan to her PCP, but he did not really seem willing to discontinue or change the medication.  She says that her appetite is not great as she does not seem to get hungry and just eats because she knows that she needs to.  Her weight is down about 1.5 pounds since she was seen here about 1 month ago.  No rectal bleeding and no abdominal pain.   Past Medical History:  Diagnosis Date  . Actinic keratosis, hx of    chest wall (2012)  . Allergic rhinitis   . Anxiety   . Arthritis    "spine, hips" (01/30/2017)  . Bursitis   . Cellulitis of right foot 01/30/2017  . Colon polyps   . Diverticulosis   . Esophageal dysmotilities 10/16/2017  . Fibromyalgia    "qwhere" (01/30/2017)  . Gallstones   .  GERD (gastroesophageal reflux disease)   . High cholesterol   . History of kidney stones   . History of stomach ulcers   . Hx of squamous cell carcinoma excision    right mandible (2011), right arm (2014)  . Hypertension   . IBS (irritable bowel syndrome)   . Osteoarthritis   . Pneumonia 2000s X 2   "twice" (01/30/2017)  . Pyloric stenosis   . Stroke [facility]) 10/2014   denies residual on 01/30/2017   Past Surgical History:  Procedure Laterality Date  . APPENDECTOMY    . CARDIOVASCULAR STRESS TEST  04/2016   nuclear cardiolite stress test done in [place] (no ischemia, no LVH, no LV systolic function)  . CATARACT EXTRACTION W/ INTRAOCULAR LENS  IMPLANT, BILATERAL Bilateral   . CHOLECYSTECTOMY OPEN    . COLONOSCOPY  2009, 2014  . DIAGNOSTIC MAMMOGRAM  2009, 2012  . TONSILLECTOMY      reports that she has never smoked. She has never used smokeless tobacco. She reports current alcohol use. She reports that she does not use drugs. family history includes Heart attack in her father, maternal grandfather, maternal grandmother, mother, paternal grandfather, and paternal grandmother; Stroke in her father, maternal grandfather, maternal grandmother, mother, paternal grandfather, and paternal grandmother. Allergies  Allergen Reactions  . Butrans [Buprenorphine]  Other (See Comments)    Severe chest pains  . Codeine Other (See Comments)    Severe Chest pains  . Morphine And Related Other (See Comments)    Severe chest pains      Outpatient Encounter Medications as of 01/02/2019  Medication Sig  . acetaminophen (TYLENOL) 650 MG CR tablet Take 650 mg by mouth every 8 (eight) hours as needed for pain.  Marland Kitchen ALPRAZolam (XANAX) 0.5 MG tablet TAKE 1 TABLET BY MOUTH ONCE DAILY AT BEDTIME AS NEEDED FOR ANXIETY  . Ascorbic Acid (VITAMIN C) 250 MG CHEW Chew 250 mg by mouth 3 (three) times a week. 2 tab 3 time a week  . aspirin EC 325 MG tablet Take 325 mg by mouth daily.  Marland Kitchen atorvastatin (LIPITOR) 10 MG  tablet Take 1 tablet (10 mg total) by mouth daily.  . cholestyramine (QUESTRAN) 4 g packet Take 1 packet (4 g total) by mouth daily.  Marland Kitchen dicyclomine (BENTYL) 10 MG capsule Take 1 capsule (10 mg total) by mouth 2 (two) times daily.  . diphenoxylate-atropine (LOMOTIL) 2.5-0.025 MG tablet Take 1 tablet by mouth 3 (three) times daily as needed for diarrhea or loose stools.  . famotidine (PEPCID) 20 MG tablet TAKE 1 TABLET BY MOUTH TWICE DAILY  . fexofenadine (ALLEGRA) 180 MG tablet Take 180 mg by mouth daily.  . hydrochlorothiazide (MICROZIDE) 12.5 MG capsule Take 1 capsule (12.5 mg total) by mouth daily.  . IRON PO Take 65 mg by mouth 3 (three) times a week.  . loperamide (IMODIUM) 2 MG capsule Take by mouth as needed for diarrhea or loose stools.  Marland Kitchen losartan (COZAAR) 100 MG tablet TAKE 1 TABLET BY MOUTH ONCE DAILY  . metoprolol succinate (TOPROL-XL) 25 MG 24 hr tablet Take 1 tablet (25 mg total) by mouth daily. Take with or immediately following a meal.  . Omega 3-6-9 Fatty Acids (OMEGA-3 FUSION) LIQD Take by mouth. 2840 mg, take one teaspoon daily  . Vitamin D, Ergocalciferol, (DRISDOL) 1.25 MG (50000 UT) CAPS capsule TAKE 1 CAPSULE BY MOUTH ONCE A WEEK   No facility-administered encounter medications on file as of 01/02/2019.      REVIEW OF SYSTEMS  : All other systems reviewed and negative except where noted in the History of Present Illness.   PHYSICAL EXAM: BP (!) 150/70 (BP Location: Left Arm, Patient Position: Sitting, Cuff Size: Normal)   Pulse 68   Ht 5\' 1"  (1.549 m) Comment: height measured without shoes  Wt 108 lb 6 oz (49.2 kg)   BMI 20.48 kg/m  General: Well developed white female in no acute distress Head: Normocephalic and atraumatic Eyes:  Sclerae anicteric, conjunctiva pink. Ears: Normal auditory acuity Lungs: Clear throughout to auscultation; no increased WOB. Heart: Regular rate and rhythm Abdomen: Soft, non-distended.  BS present.  Non-tender. Musculoskeletal:  Symmetrical with no gross deformities  Skin: No lesions on visible extremities Extremities:  Mild edema in B/L LE's.  Wound care stocking on the RLE. Neurological: Alert oriented x 4, grossly non-focal Psychological:  Alert and cooperative. Normal mood and affect  ASSESSMENT AND PLAN: *85 year old female with chronic intermittent diarrhea for the past 40 years.  Reports being given a diagnosis of IBS in the past.  Recent celiac labs negative.  Lactose-free diet really did not seem to make any significant difference.  Is taking Questran daily and using either 1 Lomotil or 1 Imodium daily along with Bentyl BID and has had improvement with that.  She says that she asked her  PCP about the losartan, but he did not really seem willing to discontinue or change that medication.  I will copy my note to him and asked him to consider this as losartan can cause diarrhea as a side effect and can also be an inciting factor for microscopic/collagenous colitis.  I discussed with her possible flexible sigmoidoscopy for random colon biopsies, but she understandably would really like to avoid procedures at her age if possible.  I am going to increase her Questran to twice daily with the hope that she can start cutting out the Imodium and the Lomotil.  Maybe eventually can discontinue the Bentyl as well.  I will have her follow-up with Dr. Rush Landmark in approximately 4 weeks as I will be out of the office.   CC:  Biagio Borg, MD

## 2019-01-02 NOTE — Progress Notes (Signed)
Agree with assessment and plan as per PA Zehr.

## 2019-01-03 DIAGNOSIS — L97219 Non-pressure chronic ulcer of right calf with unspecified severity: Secondary | ICD-10-CM | POA: Diagnosis not present

## 2019-01-03 DIAGNOSIS — L97811 Non-pressure chronic ulcer of other part of right lower leg limited to breakdown of skin: Secondary | ICD-10-CM | POA: Diagnosis not present

## 2019-01-03 DIAGNOSIS — I87311 Chronic venous hypertension (idiopathic) with ulcer of right lower extremity: Secondary | ICD-10-CM | POA: Diagnosis not present

## 2019-01-03 DIAGNOSIS — L97812 Non-pressure chronic ulcer of other part of right lower leg with fat layer exposed: Secondary | ICD-10-CM | POA: Diagnosis not present

## 2019-01-03 DIAGNOSIS — I87331 Chronic venous hypertension (idiopathic) with ulcer and inflammation of right lower extremity: Secondary | ICD-10-CM | POA: Diagnosis not present

## 2019-01-03 DIAGNOSIS — I87322 Chronic venous hypertension (idiopathic) with inflammation of left lower extremity: Secondary | ICD-10-CM | POA: Diagnosis not present

## 2019-01-06 ENCOUNTER — Other Ambulatory Visit: Payer: Self-pay | Admitting: Internal Medicine

## 2019-01-06 DIAGNOSIS — I87322 Chronic venous hypertension (idiopathic) with inflammation of left lower extremity: Secondary | ICD-10-CM | POA: Diagnosis not present

## 2019-01-06 DIAGNOSIS — I87331 Chronic venous hypertension (idiopathic) with ulcer and inflammation of right lower extremity: Secondary | ICD-10-CM | POA: Diagnosis not present

## 2019-01-06 DIAGNOSIS — I10 Essential (primary) hypertension: Secondary | ICD-10-CM | POA: Diagnosis not present

## 2019-01-06 DIAGNOSIS — L97811 Non-pressure chronic ulcer of other part of right lower leg limited to breakdown of skin: Secondary | ICD-10-CM | POA: Diagnosis not present

## 2019-01-06 DIAGNOSIS — L03115 Cellulitis of right lower limb: Secondary | ICD-10-CM | POA: Diagnosis not present

## 2019-01-06 DIAGNOSIS — L89890 Pressure ulcer of other site, unstageable: Secondary | ICD-10-CM | POA: Diagnosis not present

## 2019-01-08 DIAGNOSIS — I10 Essential (primary) hypertension: Secondary | ICD-10-CM | POA: Diagnosis not present

## 2019-01-08 DIAGNOSIS — L03115 Cellulitis of right lower limb: Secondary | ICD-10-CM | POA: Diagnosis not present

## 2019-01-08 DIAGNOSIS — I87322 Chronic venous hypertension (idiopathic) with inflammation of left lower extremity: Secondary | ICD-10-CM | POA: Diagnosis not present

## 2019-01-08 DIAGNOSIS — L89890 Pressure ulcer of other site, unstageable: Secondary | ICD-10-CM | POA: Diagnosis not present

## 2019-01-08 DIAGNOSIS — I87331 Chronic venous hypertension (idiopathic) with ulcer and inflammation of right lower extremity: Secondary | ICD-10-CM | POA: Diagnosis not present

## 2019-01-08 DIAGNOSIS — L97811 Non-pressure chronic ulcer of other part of right lower leg limited to breakdown of skin: Secondary | ICD-10-CM | POA: Diagnosis not present

## 2019-01-10 ENCOUNTER — Encounter (HOSPITAL_BASED_OUTPATIENT_CLINIC_OR_DEPARTMENT_OTHER): Payer: Medicare Other | Attending: Internal Medicine

## 2019-01-10 DIAGNOSIS — L97811 Non-pressure chronic ulcer of other part of right lower leg limited to breakdown of skin: Secondary | ICD-10-CM | POA: Insufficient documentation

## 2019-01-10 DIAGNOSIS — S81801A Unspecified open wound, right lower leg, initial encounter: Secondary | ICD-10-CM | POA: Diagnosis not present

## 2019-01-10 DIAGNOSIS — I87331 Chronic venous hypertension (idiopathic) with ulcer and inflammation of right lower extremity: Secondary | ICD-10-CM | POA: Diagnosis not present

## 2019-01-13 DIAGNOSIS — I10 Essential (primary) hypertension: Secondary | ICD-10-CM | POA: Diagnosis not present

## 2019-01-13 DIAGNOSIS — L97811 Non-pressure chronic ulcer of other part of right lower leg limited to breakdown of skin: Secondary | ICD-10-CM | POA: Diagnosis not present

## 2019-01-13 DIAGNOSIS — I87322 Chronic venous hypertension (idiopathic) with inflammation of left lower extremity: Secondary | ICD-10-CM | POA: Diagnosis not present

## 2019-01-13 DIAGNOSIS — I87331 Chronic venous hypertension (idiopathic) with ulcer and inflammation of right lower extremity: Secondary | ICD-10-CM | POA: Diagnosis not present

## 2019-01-13 DIAGNOSIS — L89890 Pressure ulcer of other site, unstageable: Secondary | ICD-10-CM | POA: Diagnosis not present

## 2019-01-13 DIAGNOSIS — L03115 Cellulitis of right lower limb: Secondary | ICD-10-CM | POA: Diagnosis not present

## 2019-01-15 DIAGNOSIS — L97811 Non-pressure chronic ulcer of other part of right lower leg limited to breakdown of skin: Secondary | ICD-10-CM | POA: Diagnosis not present

## 2019-01-15 DIAGNOSIS — I87322 Chronic venous hypertension (idiopathic) with inflammation of left lower extremity: Secondary | ICD-10-CM | POA: Diagnosis not present

## 2019-01-15 DIAGNOSIS — L89890 Pressure ulcer of other site, unstageable: Secondary | ICD-10-CM | POA: Diagnosis not present

## 2019-01-15 DIAGNOSIS — I87331 Chronic venous hypertension (idiopathic) with ulcer and inflammation of right lower extremity: Secondary | ICD-10-CM | POA: Diagnosis not present

## 2019-01-15 DIAGNOSIS — L03115 Cellulitis of right lower limb: Secondary | ICD-10-CM | POA: Diagnosis not present

## 2019-01-15 DIAGNOSIS — I10 Essential (primary) hypertension: Secondary | ICD-10-CM | POA: Diagnosis not present

## 2019-01-17 DIAGNOSIS — I87331 Chronic venous hypertension (idiopathic) with ulcer and inflammation of right lower extremity: Secondary | ICD-10-CM | POA: Diagnosis not present

## 2019-01-17 DIAGNOSIS — I872 Venous insufficiency (chronic) (peripheral): Secondary | ICD-10-CM | POA: Diagnosis not present

## 2019-01-17 DIAGNOSIS — S81801A Unspecified open wound, right lower leg, initial encounter: Secondary | ICD-10-CM | POA: Diagnosis not present

## 2019-01-17 DIAGNOSIS — L97811 Non-pressure chronic ulcer of other part of right lower leg limited to breakdown of skin: Secondary | ICD-10-CM | POA: Diagnosis not present

## 2019-01-22 DIAGNOSIS — L03115 Cellulitis of right lower limb: Secondary | ICD-10-CM | POA: Diagnosis not present

## 2019-01-22 DIAGNOSIS — I87322 Chronic venous hypertension (idiopathic) with inflammation of left lower extremity: Secondary | ICD-10-CM | POA: Diagnosis not present

## 2019-01-22 DIAGNOSIS — I10 Essential (primary) hypertension: Secondary | ICD-10-CM | POA: Diagnosis not present

## 2019-01-22 DIAGNOSIS — L89890 Pressure ulcer of other site, unstageable: Secondary | ICD-10-CM | POA: Diagnosis not present

## 2019-01-22 DIAGNOSIS — I87331 Chronic venous hypertension (idiopathic) with ulcer and inflammation of right lower extremity: Secondary | ICD-10-CM | POA: Diagnosis not present

## 2019-01-22 DIAGNOSIS — L97811 Non-pressure chronic ulcer of other part of right lower leg limited to breakdown of skin: Secondary | ICD-10-CM | POA: Diagnosis not present

## 2019-01-24 ENCOUNTER — Ambulatory Visit: Payer: Medicare Other | Admitting: Gastroenterology

## 2019-02-03 ENCOUNTER — Other Ambulatory Visit: Payer: Self-pay | Admitting: Internal Medicine

## 2019-02-04 ENCOUNTER — Telehealth: Payer: Self-pay | Admitting: Nurse Practitioner

## 2019-02-04 NOTE — Telephone Encounter (Signed)
Ok with me 

## 2019-02-04 NOTE — Telephone Encounter (Signed)
Patient is requesting to transfer care from Dr Cathlean Cower to [facility].  Dr Jenny Reichmann, is this okay with you? Susan [name], is this okay with you?

## 2019-02-04 NOTE — Telephone Encounter (Signed)
Copied from [place] [id number]. Topic: Appointment Scheduling - Transfer of Care >> Feb 04, 2019  3:01 PM Margot Ables wrote: Pt is requesting to transfer FROM: Dr. Cathlean Cower Pt is requesting to transfer TO: Wilfred Lacy, NP Reason for requested transfer: pt saw Baldo Ash previously at [facility] and is wanting to change back to her. Please advise on scheduling.   Send CRM to patient's current PCP (transferring FROM).

## 2019-02-05 NOTE — Telephone Encounter (Signed)
Can you help with scheduling please?

## 2019-02-05 NOTE — Telephone Encounter (Signed)
ok 

## 2019-02-06 ENCOUNTER — Telehealth: Payer: Self-pay | Admitting: Internal Medicine

## 2019-02-06 NOTE — Telephone Encounter (Signed)
Copied from [place] [id number]. Topic: General - Other >> Feb 06, 2019  3:32 PM Keene Breath wrote: Reason for CRM: [name] with [name] called to follow up on an email she sent a few weeks ago regarding her home visit with the patient.  In the email she indicated that the patient's HR was low.  Patient said that it sometimes fluctuates between high and low.  Please advise if there is anything that needs to be done.  CB# [phone]

## 2019-02-07 NOTE — Telephone Encounter (Signed)
Spoke with [name], she stated that she sent over a fax a few weeks ago. I told her we were having fax issues and more than likely didn't receive it. I provided her with the new fax number and she will fax it again. She stated that the patient informed her that her HR is always low but she wanted PCP to know.   I told [name] that I would look out for the fax and follow up with her if PCP has any follow up instructions.

## 2019-02-07 NOTE — Telephone Encounter (Signed)
Please clarify the range and if any symptoms present when low

## 2019-02-10 NOTE — Telephone Encounter (Signed)
I called and spoke to patient, patient informed TOC approved. Patient scheduled to see Susan [name] on 02/19/2019.

## 2019-02-18 ENCOUNTER — Other Ambulatory Visit: Payer: Self-pay

## 2019-02-19 ENCOUNTER — Ambulatory Visit (INDEPENDENT_AMBULATORY_CARE_PROVIDER_SITE_OTHER): Payer: Medicare Other | Admitting: Nurse Practitioner

## 2019-02-19 ENCOUNTER — Encounter: Payer: Self-pay | Admitting: Nurse Practitioner

## 2019-02-19 VITALS — BP 160/70 | HR 58 | Temp 98.3°F | Ht 61.0 in | Wt 108.6 lb

## 2019-02-19 DIAGNOSIS — K582 Mixed irritable bowel syndrome: Secondary | ICD-10-CM

## 2019-02-19 DIAGNOSIS — I10 Essential (primary) hypertension: Secondary | ICD-10-CM | POA: Diagnosis not present

## 2019-02-19 DIAGNOSIS — I872 Venous insufficiency (chronic) (peripheral): Secondary | ICD-10-CM

## 2019-02-19 DIAGNOSIS — Z7189 Other specified counseling: Secondary | ICD-10-CM

## 2019-02-19 DIAGNOSIS — K529 Noninfective gastroenteritis and colitis, unspecified: Secondary | ICD-10-CM

## 2019-02-19 MED ORDER — LOSARTAN POTASSIUM 100 MG PO TABS: 100.0000 mg | ORAL_TABLET | Freq: Every day | ORAL | 1 refills | Status: DC

## 2019-02-19 MED ORDER — FUROSEMIDE 20 MG PO TABS: 20.0000 mg | ORAL_TABLET | Freq: Every day | ORAL | 0 refills | Status: DC

## 2019-02-19 MED ORDER — POTASSIUM CHLORIDE CRYS ER 20 MEQ PO TBCR: 20.0000 meq | EXTENDED_RELEASE_TABLET | Freq: Every day | ORAL | 0 refills | Status: DC

## 2019-02-19 NOTE — Patient Instructions (Signed)
STOP HYDROCHLOROTHIAZIDE  Start furosemide and potassium Continue losartan  Continue questran.  Let me know if you decide to go to vascular specialist for LE edema evaluation.

## 2019-02-19 NOTE — Progress Notes (Signed)
Subjective:  Patient ID: Susan [name], female    DOB: 03/23/1924  Age: 85 y.o. MRN: [id number]  CC: Establish Care (Transfer care from Dr. Jenny Reichmann. circulation on legs consult and complaint of watery and loss stool--going on for a long time. request DNR form)  HPI  [name] is unaccompanied today. She is here to discuss DNR form, chronic diarrhea and LE edema. She is also transferring care from Dr. Jenny Reichmann. Chronic Diarrhea: Managed by GI: Dr. Myrtice Lauth, last seen 01/02/2019. She was informed chronic diarrhea may be exacerbated by use of losartan.  LE edema: Chronic, waxing and waning, use of HCTZ with no improvement. Recent hospitalization due to hypokalemia related to 25mg  dose of HCTZ. She is unable to tolerate compression stocking due to fragile skin and pain.  Advanced directive: Reports she has living will and HCPOA completed. She will like to also have a DNR sheet to place on wall at home.  Reviewed past Medical, Social and Family history today.  Outpatient Medications Prior to Visit  Medication Sig Dispense Refill  . acetaminophen (TYLENOL) 650 MG CR tablet Take 650 mg by mouth every 8 (eight) hours as needed for pain.    Marland Kitchen ALPRAZolam (XANAX) 0.5 MG tablet TAKE 1 TABLET BY MOUTH ONCE DAILY AT BEDTIME AS NEEDED FOR ANXIETY 30 tablet 2  . Ascorbic Acid (VITAMIN C) 250 MG CHEW Chew 250 mg by mouth 3 (three) times a week. 2 tab 3 time a week    . aspirin EC 325 MG tablet Take 325 mg by mouth daily.    Marland Kitchen atorvastatin (LIPITOR) 10 MG tablet Take 1 tablet (10 mg total) by mouth daily. 90 tablet 1  . cholestyramine (QUESTRAN) 4 g packet Take 1 packet (4 g total) by mouth daily. 90 each 3  . dicyclomine (BENTYL) 10 MG capsule Take 1 capsule (10 mg total) by mouth 2 (two) times daily. 60 capsule 3  . diphenoxylate-atropine (LOMOTIL) 2.5-0.025 MG tablet Take 1 tablet by mouth 3 (three) times daily as needed for diarrhea or loose stools. 90 tablet 1  . famotidine (PEPCID) 20 MG tablet TAKE 1  TABLET BY MOUTH TWICE DAILY 180 tablet 1  . fexofenadine (ALLEGRA) 180 MG tablet Take 180 mg by mouth daily.    . IRON PO Take 65 mg by mouth 3 (three) times a week.    . loperamide (IMODIUM) 2 MG capsule Take by mouth as needed for diarrhea or loose stools.    . metoprolol succinate (TOPROL-XL) 25 MG 24 hr tablet Take 1 tablet by mouth once daily 90 tablet 0  . Omega 3-6-9 Fatty Acids (OMEGA-3 FUSION) LIQD Take by mouth. 2840 mg, take one teaspoon daily    . Vitamin D, Ergocalciferol, (DRISDOL) 1.25 MG (50000 UT) CAPS capsule TAKE 1 CAPSULE BY MOUTH ONCE A WEEK 12 capsule 0  . hydrochlorothiazide (MICROZIDE) 12.5 MG capsule Take 1 capsule (12.5 mg total) by mouth daily. 90 capsule 3  . losartan (COZAAR) 100 MG tablet TAKE 1 TABLET BY MOUTH ONCE DAILY 90 tablet 0   No facility-administered medications prior to visit.     ROS Review of Systems  Constitutional: Negative.   HENT: Positive for hearing loss.   Respiratory: Negative for cough and shortness of breath.   Cardiovascular: Positive for leg swelling. Negative for chest pain, palpitations, orthopnea and PND.  Gastrointestinal: Positive for diarrhea. Negative for abdominal pain, constipation and nausea.  Genitourinary: Negative.   Musculoskeletal: Negative for falls and joint pain.  Skin: Negative.  Psychiatric/Behavioral: Negative for depression and suicidal ideas. The patient is not nervous/anxious and does not have insomnia.   ,  Objective:  BP (!) 160/70   Pulse (!) 58   Temp 98.3 F (36.8 C) (Oral)   Ht 5\' 1"  (1.549 m)   Wt 108 lb 9.6 oz (49.3 kg)   SpO2 98%   BMI 20.52 kg/m   BP Readings from Last 3 Encounters:  02/19/19 (!) 160/70  01/02/19 (!) 150/70  01/01/19 128/84    Wt Readings from Last 3 Encounters:  02/19/19 108 lb 9.6 oz (49.3 kg)  01/02/19 108 lb 6 oz (49.2 kg)  01/01/19 107 lb (48.5 kg)    Physical Exam Vitals signs reviewed.  Cardiovascular:     Heart sounds: Murmur present.  Pulmonary:      Effort: Pulmonary effort is normal.     Breath sounds: Normal breath sounds.  Musculoskeletal:     Right lower leg: Edema present.     Left lower leg: Edema present.  Neurological:     Mental Status: She is alert and oriented to person, place, and time.  Psychiatric:        Mood and Affect: Mood normal.        Behavior: Behavior normal.        Thought Content: Thought content normal.     Lab Results  Component Value Date   WBC 9.0 12/24/2018   HGB 13.3 12/24/2018   HCT 40.0 12/24/2018   PLT 328.0 12/24/2018   GLUCOSE 109 (H) 12/24/2018   CHOL 227 (H) 05/01/2018   TRIG 213.0 (H) 05/01/2018   HDL 80.10 05/01/2018   LDLDIRECT 102.0 05/01/2018   LDLCALC 104 (H) 10/16/2017   ALT 19 12/06/2018   AST 23 12/06/2018   NA 140 12/24/2018   K 4.1 12/24/2018   CL 101 12/24/2018   CREATININE 0.55 12/24/2018   BUN 10 12/24/2018   CO2 30 12/24/2018   TSH 1.77 10/16/2017   HGBA1C 5.6 05/01/2018    Dg Tibia/fibula Right  Result Date: 12/06/2018 CLINICAL DATA:  Ulcerations at the distal right lower leg. Assess for osseous erosion. EXAM: RIGHT TIBIA AND FIBULA - 2 VIEW COMPARISON:  None. FINDINGS: There is no evidence of fracture or dislocation. No definite osseous erosions are seen. The tibia and fibula appear intact. The knee joint is grossly unremarkable in appearance. The ankle mortise is incompletely assessed, but appears unremarkable. The subtalar joint is within normal limits. A plantar calcaneal spur is noted. Diffuse soft tissue swelling is noted about the lower leg, with underlying soft tissue ulcerations. Scattered vascular calcifications are seen. IMPRESSION: 1. No evidence of fracture or dislocation. No definite osseous erosions seen. 2. Diffuse soft tissue swelling about the lower leg, with soft tissue ulcerations. 3. Scattered vascular calcifications seen. Electronically Signed   By: Garald Balding M.D.   On: 12/06/2018 05:54    Assessment & Plan:   Demiah was seen today for  establish care.  Diagnoses and all orders for this visit:  Chronic diarrhea  Irritable bowel syndrome with both constipation and diarrhea  Essential hypertension -     losartan (COZAAR) 100 MG tablet; Take 1 tablet (100 mg total) by mouth daily. -     furosemide (LASIX) 20 MG tablet; Take 1 tablet (20 mg total) by mouth daily. -     potassium chloride SA (K-DUR,KLOR-CON) 20 MEQ tablet; Take 1 tablet (20 mEq total) by mouth daily.  Venous insufficiency of both lower extremities -  furosemide (LASIX) 20 MG tablet; Take 1 tablet (20 mg total) by mouth daily. -     potassium chloride SA (K-DUR,KLOR-CON) 20 MEQ tablet; Take 1 tablet (20 mEq total) by mouth daily.  DNR (do not resuscitate) discussion   I have discontinued Bluma Aprea's hydrochlorothiazide. I have also changed her losartan. Additionally, I am having her start on furosemide and potassium chloride SA. Lastly, I am having her maintain her fexofenadine, aspirin EC, acetaminophen, IRON PO, Vitamin C, loperamide, famotidine, atorvastatin, diphenoxylate-atropine, Omega-3 Fusion, dicyclomine, ALPRAZolam, Vitamin D (Ergocalciferol), cholestyramine, and metoprolol succinate.  Meds ordered this encounter  Medications  . losartan (COZAAR) 100 MG tablet    Sig: Take 1 tablet (100 mg total) by mouth daily.    Dispense:  90 tablet    Refill:  1    Order Specific Question:   Supervising Provider    Answer:   MATTHEWS, CODY [[id number]]  . furosemide (LASIX) 20 MG tablet    Sig: Take 1 tablet (20 mg total) by mouth daily.    Dispense:  30 tablet    Refill:  0    Order Specific Question:   Supervising Provider    Answer:   MATTHEWS, CODY [[id number]]  . potassium chloride SA (K-DUR,KLOR-CON) 20 MEQ tablet    Sig: Take 1 tablet (20 mEq total) by mouth daily.    Dispense:  30 tablet    Refill:  0    Order Specific Question:   Supervising Provider    Answer:   MATTHEWS, CODY [[id number]]    Problem List Items Addressed This Visit       Cardiovascular and Mediastinum   Essential hypertension    Uncontrolled with low dose HCTZ, metoprolol and losartan. Unable to tolerate amlodipine due to LE edema.  D/c HCTZ Add furosemide and potassium F/up in 2weeks Repeat BMP then       Relevant Medications   losartan (COZAAR) 100 MG tablet   furosemide (LASIX) 20 MG tablet   potassium chloride SA (K-DUR,KLOR-CON) 20 MEQ tablet   Venous insufficiency of both lower extremities    Unable to palpate distal pulse. Possible PAD? Unable to tolerate compression stocking. No improvement with HCTZ 12.5mg . unable to increase dose due to recent severe hypokalemia.  D/C HCTZ Start furosemide and KCL Declined referral to vascular specialist or any additional testing due to possible out of pocket cost.      Relevant Medications   losartan (COZAAR) 100 MG tablet   furosemide (LASIX) 20 MG tablet   potassium chloride SA (K-DUR,KLOR-CON) 20 MEQ tablet     Digestive   Chronic diarrhea - Primary    We discussed losartan and diarrhea. Advised her that the incidence of diarrhea is 4% among diabetics, hence do not think changing medication will be of any benefit to her.  Since she has experience decrease in stool frequency with questran, I advised to continue medication at this time.      IBS (irritable bowel syndrome)     Other   DNR (do not resuscitate) discussion    Completed orange sheet Original given to patient.  Advised to bring copy of Advanced directives and HCPOA.          Follow-up: Return in about 2 weeks (around 03/05/2019) for LE edema and HTN.  Wilfred Lacy, NP

## 2019-02-20 DIAGNOSIS — Z7189 Other specified counseling: Secondary | ICD-10-CM | POA: Insufficient documentation

## 2019-02-20 NOTE — Assessment & Plan Note (Signed)
Completed orange sheet Original given to patient.  Advised to bring copy of Advanced directives and HCPOA.

## 2019-02-20 NOTE — Assessment & Plan Note (Addendum)
BNP at 121 during recent hospitalization 12/2018. Unable to palpate distal pulse. Possible PAD? Unable to tolerate compression stocking. No improvement with HCTZ 12.5mg . unable to increase dose due to recent severe hypokalemia.  D/C HCTZ Start furosemide and KCL Declined referral to vascular specialist or any additional testing due to possible out of pocket cost.

## 2019-02-20 NOTE — Assessment & Plan Note (Signed)
We discussed losartan and diarrhea. Advised her that the incidence of diarrhea is 4% among diabetics, hence do not think changing medication will be of any benefit to her.  Since she has experience decrease in stool frequency with questran, I advised to continue medication at this time.

## 2019-02-20 NOTE — Assessment & Plan Note (Addendum)
Uncontrolled with low dose HCTZ, metoprolol and losartan. Unable to tolerate amlodipine due to LE edema.  D/c HCTZ Add furosemide and potassium F/up in 2weeks Repeat BMP then

## 2019-02-24 ENCOUNTER — Encounter: Payer: Self-pay | Admitting: Nurse Practitioner

## 2019-02-24 ENCOUNTER — Telehealth: Payer: Self-pay | Admitting: Nurse Practitioner

## 2019-02-24 DIAGNOSIS — I872 Venous insufficiency (chronic) (peripheral): Secondary | ICD-10-CM

## 2019-02-24 MED ORDER — POTASSIUM CHLORIDE CRYS ER 20 MEQ PO TBCR: 20.0000 meq | EXTENDED_RELEASE_TABLET | Freq: Every day | ORAL | 0 refills | Status: DC

## 2019-02-24 NOTE — Telephone Encounter (Signed)
I do not recommend liquid potassium due to inability to tolerate taste. I changed from tablet to dissolvable capsules instead.

## 2019-02-24 NOTE — Telephone Encounter (Signed)
Pt stated she forgot to let Baldo Ash know that she had a hard time taking K+ pill in the past. Pharmacy stated they have K+ comes ina liquid form. Please advise, pt have not pick on rx we sent on 02/19/2019.

## 2019-02-24 NOTE — Telephone Encounter (Signed)
Patient called requesting her potassium rx be sent in as a liquid as she has trouble swallowing pills.

## 2019-02-25 NOTE — Telephone Encounter (Signed)
Pt is aware.  

## 2019-02-27 ENCOUNTER — Ambulatory Visit: Payer: Medicare Other | Admitting: Gastroenterology

## 2019-03-03 ENCOUNTER — Other Ambulatory Visit: Payer: Self-pay | Admitting: Family Medicine

## 2019-03-04 ENCOUNTER — Telehealth: Payer: Self-pay | Admitting: Nurse Practitioner

## 2019-03-04 ENCOUNTER — Ambulatory Visit: Payer: Medicare Other | Admitting: Family Medicine

## 2019-03-04 DIAGNOSIS — F418 Other specified anxiety disorders: Secondary | ICD-10-CM

## 2019-03-04 MED ORDER — ALPRAZOLAM 0.5 MG PO TABS: ORAL_TABLET | ORAL | 1 refills | Status: DC

## 2019-03-04 NOTE — Telephone Encounter (Signed)
Refill sent.

## 2019-03-04 NOTE — Telephone Encounter (Signed)
Pt requesting refill for Alprazolam 0.5 mg. Last ov was 03/01/2019 and next ov scheduled 03/10/2019.   PMP checked: 12/05/2018, 01/06/2019, 02/03/2019 for 30 tab at [place].

## 2019-03-05 NOTE — Telephone Encounter (Signed)
Pt is aware.  

## 2019-03-10 ENCOUNTER — Encounter: Payer: Self-pay | Admitting: Nurse Practitioner

## 2019-03-10 ENCOUNTER — Ambulatory Visit (INDEPENDENT_AMBULATORY_CARE_PROVIDER_SITE_OTHER): Payer: Medicare Other | Admitting: Nurse Practitioner

## 2019-03-10 VITALS — BP 139/96 | HR 68 | Temp 97.5°F | Ht 61.0 in | Wt 109.0 lb

## 2019-03-10 DIAGNOSIS — I10 Essential (primary) hypertension: Secondary | ICD-10-CM | POA: Diagnosis not present

## 2019-03-10 DIAGNOSIS — I872 Venous insufficiency (chronic) (peripheral): Secondary | ICD-10-CM

## 2019-03-10 DIAGNOSIS — E782 Mixed hyperlipidemia: Secondary | ICD-10-CM

## 2019-03-10 MED ORDER — ATORVASTATIN CALCIUM 10 MG PO TABS: 10.0000 mg | ORAL_TABLET | Freq: Every day | ORAL | 1 refills | Status: DC

## 2019-03-10 MED ORDER — FUROSEMIDE 20 MG PO TABS: 20.0000 mg | ORAL_TABLET | Freq: Every day | ORAL | 0 refills | Status: DC

## 2019-03-10 MED ORDER — POTASSIUM CHLORIDE CRYS ER 20 MEQ PO TBCR: 20.0000 meq | EXTENDED_RELEASE_TABLET | Freq: Every day | ORAL | 0 refills | Status: DC

## 2019-03-10 MED ORDER — METOPROLOL SUCCINATE ER 25 MG PO TB24: 25.0000 mg | ORAL_TABLET | Freq: Every day | ORAL | 1 refills | Status: DC

## 2019-03-10 NOTE — Progress Notes (Signed)
Virtual Visit via Telephone Note  I connected with Marilynn Ekstein on 03/10/19 at  1:30 PM EDT by telephone and verified that I am speaking with the correct person using two identifiers.   I discussed the limitations, risks, security and privacy concerns of performing an evaluation and management service by telephone and the availability of in person appointments. I also discussed with the patient that there may be a patient responsible charge related to this service. The patient expressed understanding and agreed to proceed.  CC: 2 wk follow up--Bentyle,questran and lasix med consult? pt stated her BP is okey--Leg still has some swelling,unable to go see specialist right now/ FYI--pt will recheck her BP again,weight and temp prior to the appt  History of Present Illness: Chronic Diarrhea: Stable with use of questran and dicyclomine as prescribed by Dr. Myrtice Lauth.. No use of lomotil or imodium for last 57month per patient.  HTN: Waxing and waning BP per patient. 160s/90s -130s/80s. No headache, no dizziness. improved LE edema with elevation and use of furosemide. BP Readings from Last 3 Encounters:  03/10/19 (!) 139/96  02/19/19 (!) 160/70  01/02/19 (!) 150/70   Observations/Objective: Reviewed medication list with patient. Alert and oriented x4 Normal speech and voice.  Assessment and Plan: [place] was seen today for follow-up.  Diagnoses and all orders for this visit:  Mixed hyperlipidemia -     atorvastatin (LIPITOR) 10 MG tablet; Take 1 tablet (10 mg total) by mouth daily.  Venous insufficiency of both lower extremities -     potassium chloride SA (K-DUR,KLOR-CON) 20 MEQ tablet; Take 1 tablet (20 mEq total) by mouth daily. -     furosemide (LASIX) 20 MG tablet; Take 1 tablet (20 mg total) by mouth daily.  Essential hypertension -     metoprolol succinate (TOPROL-XL) 25 MG 24 hr tablet; Take 1 tablet (25 mg total) by mouth daily. -     furosemide (LASIX) 20 MG tablet; Take 1 tablet  (20 mg total) by mouth daily.    Follow Up Instructions: Continue current medications. F/up in 6weeks for re eval of BP   I discussed the assessment and treatment plan with the patient. The patient was provided an opportunity to ask questions and all were answered. The patient agreed with the plan and demonstrated an understanding of the instructions.   The patient was advised to call back or seek an in-person evaluation if the symptoms worsen or if the condition fails to improve as anticipated.  I provided 30 minutes of non-face-to-face time during this encounter.   Wilfred Lacy, NP

## 2019-03-12 ENCOUNTER — Ambulatory Visit: Payer: Medicare Other | Admitting: Internal Medicine

## 2019-03-24 ENCOUNTER — Ambulatory Visit (INDEPENDENT_AMBULATORY_CARE_PROVIDER_SITE_OTHER): Payer: Medicare Other | Admitting: Family Medicine

## 2019-03-24 ENCOUNTER — Encounter: Payer: Self-pay | Admitting: Family Medicine

## 2019-03-24 ENCOUNTER — Other Ambulatory Visit: Payer: Self-pay

## 2019-03-24 ENCOUNTER — Ambulatory Visit: Payer: Self-pay

## 2019-03-24 VITALS — BP 112/72 | HR 66 | Ht 61.0 in | Wt 111.0 lb

## 2019-03-24 DIAGNOSIS — M25551 Pain in right hip: Secondary | ICD-10-CM

## 2019-03-24 DIAGNOSIS — M25552 Pain in left hip: Principal | ICD-10-CM

## 2019-03-24 DIAGNOSIS — M7061 Trochanteric bursitis, right hip: Secondary | ICD-10-CM

## 2019-03-24 DIAGNOSIS — M7062 Trochanteric bursitis, left hip: Secondary | ICD-10-CM | POA: Diagnosis not present

## 2019-03-24 NOTE — Patient Instructions (Signed)
Good to see you  Susan [name] is your friend Stay active 2 injections today  See me again in 8-10 weeks Please be safe!

## 2019-03-24 NOTE — Progress Notes (Signed)
[name] [place], [place] [address] Phone: [phone] Subjective:   [name], [name]   CC: Hip pain follow-up  HWE:[id number]  Susan [name] is a 85 y.o. female coming in with complaint of hip pain.  Patient has had severe greater trochanteric bursitis as well as hip arthritis.  Has responded fairly well though to steroid injections.  We attempted to decrease patient's office visit secondary to coronavirus but unfortunately worsening pain that is affecting daily activities.  Rates the severity of pain is 9 out of 10. Is here for injections in both hips. Pain has been constant.  Patient states waking her up at night.  Makes it difficult to even ambulate.  Using her walker on a regular basis.  Denies any fevers chills any abnormal weight loss.  Not able to do the exercises regularly secondary to the coronavirus outbreak and a lot of her classes have been canceled.    Past Medical History:  Diagnosis Date  . Actinic keratosis, hx of    chest wall (2012)  . Allergic rhinitis   . Anxiety   . Arthritis    "spine, hips" (01/30/2017)  . Bursitis   . Cellulitis of right foot 01/30/2017  . Colon polyps   . Diverticulosis   . Esophageal dysmotilities 10/16/2017  . Fibromyalgia    "qwhere" (01/30/2017)  . Gallstones   . GERD (gastroesophageal reflux disease)   . High cholesterol   . History of kidney stones   . History of stomach ulcers   . Hx of squamous cell carcinoma excision    right mandible (2011), right arm (2014)  . Hypertension   . IBS (irritable bowel syndrome)   . Osteoarthritis   . Pneumonia 2000s X 2   "twice" (01/30/2017)  . Pyloric stenosis   . Stroke [facility]) 10/2014   denies residual on 01/30/2017   Past Surgical History:  Procedure Laterality Date  . APPENDECTOMY    . CARDIOVASCULAR STRESS TEST  04/2016   nuclear cardiolite stress test done in [place] (no ischemia, no LVH, no  LV systolic function)  . CATARACT EXTRACTION W/ INTRAOCULAR LENS  IMPLANT, BILATERAL Bilateral   . CHOLECYSTECTOMY OPEN    . COLONOSCOPY  2009, 2014  . DIAGNOSTIC MAMMOGRAM  2009, 2012  . TONSILLECTOMY     Social History   Socioeconomic History  . Marital status: Widowed    Spouse name: Not on file  . Number of children: 4  . Years of education: Not on file  . Highest education level: Not on file  Occupational History  . Occupation: retired  Scientific laboratory technician  . Financial resource strain: Not on file  . Food insecurity:    Worry: Not on file    Inability: Not on file  . Transportation needs:    Medical: Not on file    Non-medical: Not on file  Tobacco Use  . Smoking status: Never Smoker  . Smokeless tobacco: Never Used  Substance and Sexual Activity  . Alcohol use: Yes    Comment: glass of red wine maybe once week  . Drug use: No  . Sexual activity: Not on file  Lifestyle  . Physical activity:    Days per week: Not on file    Minutes per session: Not on file  . Stress: Not on file  Relationships  . Social connections:    Talks on phone: Not on file  Gets together: Not on file    Attends religious service: Not on file    Active member of club or organization: Not on file    Attends meetings of clubs or organizations: Not on file    Relationship status: Not on file  Other Topics Concern  . Not on file  Social History Narrative  . Not on file   Allergies  Allergen Reactions  . Butrans [Buprenorphine] Other (See Comments)    Severe chest pains  . Codeine Other (See Comments)    Severe Chest pains  . Morphine And Related Other (See Comments)    Severe chest pains   Family History  Problem Relation Age of Onset  . Stroke Mother   . Heart attack Mother   . Stroke Father   . Heart attack Father   . Heart attack Maternal Grandmother   . Stroke Maternal Grandmother   . Stroke Paternal Grandmother   . Heart attack Paternal Grandmother   . Heart attack Maternal  Grandfather   . Stroke Maternal Grandfather   . Stroke Paternal Grandfather   . Heart attack Paternal Grandfather   . Colon cancer Neg Hx   . Pancreatic cancer Neg Hx   . Stomach cancer Neg Hx   . Esophageal cancer Neg Hx      Current Outpatient Medications (Cardiovascular):  .  atorvastatin (LIPITOR) 10 MG tablet, Take 1 tablet (10 mg total) by mouth daily. .  cholestyramine (QUESTRAN) 4 g packet, Take 1 packet (4 g total) by mouth daily. .  furosemide (LASIX) 20 MG tablet, Take 1 tablet (20 mg total) by mouth daily. Marland Kitchen  losartan (COZAAR) 100 MG tablet, Take 1 tablet (100 mg total) by mouth daily. .  metoprolol succinate (TOPROL-XL) 25 MG 24 hr tablet, Take 1 tablet (25 mg total) by mouth daily.  Current Outpatient Medications (Respiratory):  .  fexofenadine (ALLEGRA) 180 MG tablet, Take 180 mg by mouth daily.  Current Outpatient Medications (Analgesics):  .  acetaminophen (TYLENOL) 650 MG CR tablet, Take 650 mg by mouth every 8 (eight) hours as needed for pain. Marland Kitchen  aspirin EC 325 MG tablet, Take 325 mg by mouth daily.  Current Outpatient Medications (Hematological):  Marland Kitchen  IRON PO, Take 65 mg by mouth 3 (three) times a week.  Current Outpatient Medications (Other):  Marland Kitchen  ALPRAZolam (XANAX) 0.5 MG tablet, TAKE 1 TABLET BY MOUTH ONCE DAILY AT BEDTIME AS NEEDED FOR ANXIETY .  Ascorbic Acid (VITAMIN C) 250 MG CHEW, Chew 250 mg by mouth 3 (three) times a week. 2 tab 3 time a week .  dicyclomine (BENTYL) 10 MG capsule, Take 1 capsule (10 mg total) by mouth 2 (two) times daily. .  diphenoxylate-atropine (LOMOTIL) 2.5-0.025 MG tablet, Take 1 tablet by mouth 3 (three) times daily as needed for diarrhea or loose stools. .  famotidine (PEPCID) 20 MG tablet, TAKE 1 TABLET BY MOUTH TWICE DAILY .  loperamide (IMODIUM) 2 MG capsule, Take by mouth as needed for diarrhea or loose stools. .  Omega 3-6-9 Fatty Acids (OMEGA-3 FUSION) LIQD, Take by mouth. 2840 mg, take one teaspoon daily .  potassium  chloride SA (K-DUR,KLOR-CON) 20 MEQ tablet, Take 1 tablet (20 mEq total) by mouth daily. .  Vitamin D, Ergocalciferol, (DRISDOL) 1.25 MG (50000 UT) CAPS capsule, Take 1 capsule by mouth once a week    Past medical history, social, surgical and family history all reviewed in electronic medical record.  No pertanent information unless stated regarding to the  chief complaint.   Review of Systems:  No headache, visual changes, nausea, vomiting, diarrhea, constipation, dizziness, abdominal pain, skin rash, fevers, chills, night sweats, weight loss, swollen lymph nodes, body aches, joint swelling,  chest pain, shortness of breath, mood changes.  Positive muscle aches  Objective  Blood pressure 112/72, pulse 66, height 5\' 1"  (1.549 m), weight 111 lb (50.3 kg), SpO2 96 %.    General: No apparent distress alert and oriented x3 mood and affect normal, dressed appropriately.  Patient is wearing a mask today. HEENT: Pupils equal, extraocular movements intact  Respiratory: Patient's speak in full sentences and does not appear short of breath  Cardiovascular: No lower extremity edema, non tender, no erythema  Skin: Warm dry intact with no signs of infection or rash on extremities or on axial skeleton.  Abdomen: Soft nontender  Neuro: Cranial nerves II through XII are intact, neurovascularly intact in all extremities with 2+ DTRs and 2+ pulses.  Lymph: No lymphadenopathy of posterior or anterior cervical chain or axillae bilaterally.  Gait antalgic walking with the aid of a of a walker MSK:  tender with full range of motion and good stability and symmetric strength and tone of shoulders, elbows, wrist,  knee and ankles bilaterally.   Patient said pain bilaterally.  Describes it as a dull, throbbing aching pain bilaterally over the greater trochanteric area.  Limited range of motion of both hips neurovascular intact distally.  Patient does have some atrophy of the hip musculature bilaterally   Procedure:  Real-time Ultrasound Guided Injection of right greater trochanteric bursitis secondary to patient's body habitus Device: GE Logiq Q7 Ultrasound guided injection is preferred based studies that show increased duration, increased effect, greater accuracy, decreased procedural pain, increased response rate, and decreased cost with ultrasound guided versus blind injection.  Verbal informed consent obtained.  Time-out conducted.  Noted no overlying erythema, induration, or other signs of local infection.  Skin prepped in a sterile fashion.  Local anesthesia: Topical Ethyl chloride.  With sterile technique and under real time ultrasound guidance:  Greater trochanteric area was visualized and patient's bursa was noted. A 22-gauge 3 inch needle was inserted and 4 cc of 0.5% Marcaine and 1 cc of Kenalog 40 mg/dL was injected. Pictures taken Completed without difficulty  Pain immediately resolved suggesting accurate placement of the medication.  Advised to call if fevers/chills, erythema, induration, drainage, or persistent bleeding.  Images permanently stored and available for review in the ultrasound unit.  Impression: Technically successful ultrasound guided injection.    Procedure: Real-time Ultrasound Guided Injection of left  greater trochanteric bursitis secondary to patient's body habitus Device: GE Logiq Q7  Ultrasound guided injection is preferred based studies that show increased duration, increased effect, greater accuracy, decreased procedural pain, increased response rate, and decreased cost with ultrasound guided versus blind injection.  Verbal informed consent obtained.  Time-out conducted.  Noted no overlying erythema, induration, or other signs of local infection.  Skin prepped in a sterile fashion.  Local anesthesia: Topical Ethyl chloride.  With sterile technique and under real time ultrasound guidance:  Greater trochanteric area was visualized and patient's bursa was noted. A  22-gauge 3 inch needle was inserted and 4 cc of 0.5% Marcaine and 1 cc of Kenalog 40 mg/dL was injected. Pictures taken Completed without difficulty  Pain immediately resolved suggesting accurate placement of the medication.  Advised to call if fevers/chills, erythema, induration, drainage, or persistent bleeding.  Images permanently stored and available for review in the ultrasound unit.  Impression: Technically successful ultrasound guided injection.   Impression and Recommendations:     This case required medical decision making of moderate complexity. The above documentation has been reviewed and is accurate and complete Lyndal Pulley, DO       Note: This dictation was prepared with Dragon dictation along with smaller phrase technology. Any transcriptional errors that result from this process are unintentional.

## 2019-03-24 NOTE — Assessment & Plan Note (Signed)
Patient given injections and tolerated the procedure well.  We discussed icing regimen and home exercise, we discussed which activities to do which wants to avoid.  Patient is to increase activity slowly over the course the next several days.  Discussed icing regimen.  Follow-up again 8 weeks Spent  25 minutes with patient face-to-face and had greater than 50% of counseling including as described above in assessment and plan.

## 2019-04-14 ENCOUNTER — Other Ambulatory Visit: Payer: Self-pay | Admitting: Gastroenterology

## 2019-04-21 ENCOUNTER — Ambulatory Visit (INDEPENDENT_AMBULATORY_CARE_PROVIDER_SITE_OTHER): Payer: Medicare Other | Admitting: Nurse Practitioner

## 2019-04-21 ENCOUNTER — Encounter: Payer: Self-pay | Admitting: Nurse Practitioner

## 2019-04-21 VITALS — BP 141/86 | HR 61 | Ht 61.0 in

## 2019-04-21 DIAGNOSIS — I10 Essential (primary) hypertension: Secondary | ICD-10-CM

## 2019-04-21 DIAGNOSIS — I872 Venous insufficiency (chronic) (peripheral): Secondary | ICD-10-CM | POA: Diagnosis not present

## 2019-04-21 DIAGNOSIS — K529 Noninfective gastroenteritis and colitis, unspecified: Secondary | ICD-10-CM | POA: Diagnosis not present

## 2019-04-21 MED ORDER — DICYCLOMINE HCL 10 MG PO CAPS: 10.0000 mg | ORAL_CAPSULE | Freq: Two times a day (BID) | ORAL | 1 refills | Status: DC

## 2019-04-21 NOTE — Patient Instructions (Signed)
Let me know if you change your mind about referral to vascular clinic. Continue current medications. F/up in 50months.

## 2019-04-21 NOTE — Progress Notes (Signed)
Virtual Visit via Telephone Note  I connected with Susan [name] on 04/21/19 at 10:45 AM EDT by telephone and verified that I am speaking with the correct person using two identifiers.  Location: Patient: home Provider: Office   I discussed the limitations, risks, security and privacy concerns of performing an evaluation and management service by telephone and the availability of in person appointments. I also discussed with the patient that there may be a patient responsible charge related to this service. The patient expressed understanding and agreed to proceed.  CC: needs bentyl refilled, will also like to discuss need for vascular clinic referral?  History of Present Illness: LE edema secondary to venous insufficiency: Chronic, waxing and waning, no redness or pain at this time. Use of furosemide and potassium She is concerned about leaving assisted living facility due to current pandemic. She will like to wait on entering referral at this time.  HTN: Stable with losartan, and metoprolol Mildly elevated today. BP Readings from Last 3 Encounters:  04/21/19 (!) 141/86  03/24/19 112/72  03/10/19 (!) 139/96   Chronic Diarrhea: No change in GI function. Use of bentyl BID. Does not see the need to return to Gi at this time. Wt Readings from Last 3 Encounters:  03/24/19 111 lb (50.3 kg)  03/10/19 109 lb (49.4 kg)  02/19/19 108 lb 9.6 oz (49.3 kg)    Review of Systems  Respiratory: Negative for cough and shortness of breath.   Cardiovascular: Positive for leg swelling. Negative for chest pain, palpitations and claudication.  Gastrointestinal: Positive for diarrhea. Negative for abdominal pain, blood in stool, constipation, heartburn, melena and nausea.  Musculoskeletal: Negative for falls.  Neurological: Negative for dizziness and headaches.   Observations/Objective: Unable to provide physical exam due to type of visit. Normal speech and voice tone. Alert and  oriented  Assessment and Plan: Susan [name] was seen today for follow-up.  Diagnoses and all orders for this visit:  Chronic diarrhea -     dicyclomine (BENTYL) 10 MG capsule; Take 1 capsule (10 mg total) by mouth 2 (two) times daily.  Essential hypertension  Venous insufficiency of both lower extremities    Follow Up Instructions: Let me know if you change your mind about referral to vascular clinic. Continue current medications. F/up in 54months   I discussed the assessment and treatment plan with the patient. The patient was provided an opportunity to ask questions and all were answered. The patient agreed with the plan and demonstrated an understanding of the instructions.   The patient was advised to call back or seek an in-person evaluation if the symptoms worsen or if the condition fails to improve as anticipated.  I provided 13 minutes of non-face-to-face time during this encounter.   Wilfred Lacy, NP

## 2019-04-29 ENCOUNTER — Encounter: Payer: Self-pay | Admitting: Nurse Practitioner

## 2019-04-29 ENCOUNTER — Ambulatory Visit (INDEPENDENT_AMBULATORY_CARE_PROVIDER_SITE_OTHER): Payer: Medicare Other | Admitting: Nurse Practitioner

## 2019-04-29 VITALS — BP 156/78 | HR 72 | Temp 97.8°F | Ht 61.0 in | Wt 115.0 lb

## 2019-04-29 DIAGNOSIS — H6123 Impacted cerumen, bilateral: Secondary | ICD-10-CM

## 2019-04-29 MED ORDER — CARBAMIDE PEROXIDE 6.5 % OT SOLN: 5.0000 [drp] | Freq: Every day | OTIC | 0 refills | Status: DC

## 2019-04-29 NOTE — Progress Notes (Signed)
Subjective:  Patient ID: Susan [name], female    DOB: [birth year]  Age: 85 y.o. MRN: [id number]  CC: Ear Pain (left ear pain,sharp pain at times and feels like grand left side neck pain/ 3 days/)  Otalgia   There is pain in the left ear. This is a new problem. The current episode started in the past 7 days. The problem occurs constantly. The problem has been unchanged. There has been no fever. Associated symptoms include hearing loss. Pertinent negatives include no coughing, ear discharge, rhinorrhea, sore throat or vomiting. She has tried nothing for the symptoms. Her past medical history is significant for hearing loss.    Reviewed past Medical, Social and Family history today.  Outpatient Medications Prior to Visit  Medication Sig Dispense Refill  . acetaminophen (TYLENOL) 650 MG CR tablet Take 650 mg by mouth every 8 (eight) hours as needed for pain.    Marland Kitchen ALPRAZolam (XANAX) 0.5 MG tablet TAKE 1 TABLET BY MOUTH ONCE DAILY AT BEDTIME AS NEEDED FOR ANXIETY 30 tablet 1  . Ascorbic Acid (VITAMIN C) 250 MG CHEW Chew 250 mg by mouth 3 (three) times a week. 2 tab 3 time a week    . aspirin EC 325 MG tablet Take 325 mg by mouth daily.    Marland Kitchen atorvastatin (LIPITOR) 10 MG tablet Take 1 tablet (10 mg total) by mouth daily. 90 tablet 1  . cholestyramine (QUESTRAN) 4 g packet Take 1 packet (4 g total) by mouth daily. 90 each 3  . dicyclomine (BENTYL) 10 MG capsule Take 1 capsule (10 mg total) by mouth 2 (two) times daily. 180 capsule 1  . diphenoxylate-atropine (LOMOTIL) 2.5-0.025 MG tablet Take 1 tablet by mouth 3 (three) times daily as needed for diarrhea or loose stools. 90 tablet 1  . famotidine (PEPCID) 20 MG tablet TAKE 1 TABLET BY MOUTH TWICE DAILY 180 tablet 1  . fexofenadine (ALLEGRA) 180 MG tablet Take 180 mg by mouth daily.    . furosemide (LASIX) 20 MG tablet Take 1 tablet (20 mg total) by mouth daily. 90 tablet 0  . IRON PO Take 65 mg by mouth 3 (three) times a week.    . losartan  (COZAAR) 100 MG tablet Take 1 tablet (100 mg total) by mouth daily. 90 tablet 1  . metoprolol succinate (TOPROL-XL) 25 MG 24 hr tablet Take 1 tablet (25 mg total) by mouth daily. 90 tablet 1  . potassium chloride SA (K-DUR,KLOR-CON) 20 MEQ tablet Take 1 tablet (20 mEq total) by mouth daily. 90 tablet 0  . Vitamin D, Ergocalciferol, (DRISDOL) 1.25 MG (50000 UT) CAPS capsule Take 1 capsule by mouth once a week 12 capsule 0  . loperamide (IMODIUM) 2 MG capsule Take by mouth as needed for diarrhea or loose stools.    . Omega 3-6-9 Fatty Acids (OMEGA-3 FUSION) LIQD Take by mouth. 2840 mg, take one teaspoon daily     No facility-administered medications prior to visit.     ROS See HPI  Objective:  BP (!) 156/78   Pulse 72   Temp 97.8 F (36.6 C) (Oral)   Ht 5\' 1"  (1.549 m)   Wt 115 lb (52.2 kg)   SpO2 97%   BMI 21.73 kg/m   BP Readings from Last 3 Encounters:  04/29/19 (!) 156/78  04/21/19 (!) 141/86  03/24/19 112/72    Wt Readings from Last 3 Encounters:  04/29/19 115 lb (52.2 kg)  03/24/19 111 lb (50.3 kg)  03/10/19 109 lb (49.4  kg)    Physical Exam HENT:     Right Ear: External ear normal. There is impacted cerumen.     Left Ear: External ear normal. There is impacted cerumen.     Nose: Nose normal.     Mouth/Throat:     Mouth: Mucous membranes are moist.     Pharynx: No posterior oropharyngeal erythema.  Eyes:     Extraocular Movements: Extraocular movements intact.     Conjunctiva/sclera: Conjunctivae normal.  Neck:     Musculoskeletal: Normal range of motion.  Cardiovascular:     Rate and Rhythm: Normal rate.  Lymphadenopathy:     Cervical: No cervical adenopathy.  Neurological:     Mental Status: She is oriented to person, place, and time.     Lab Results  Component Value Date   WBC 9.0 12/24/2018   HGB 13.3 12/24/2018   HCT 40.0 12/24/2018   PLT 328.0 12/24/2018   GLUCOSE 109 (H) 12/24/2018   CHOL 227 (H) 05/01/2018   TRIG 213.0 (H) 05/01/2018    HDL 80.10 05/01/2018   LDLDIRECT 102.0 05/01/2018   LDLCALC 104 (H) 10/16/2017   ALT 19 12/06/2018   AST 23 12/06/2018   NA 140 12/24/2018   K 4.1 12/24/2018   CL 101 12/24/2018   CREATININE 0.55 12/24/2018   BUN 10 12/24/2018   CO2 30 12/24/2018   TSH 1.77 10/16/2017   HGBA1C 5.6 05/01/2018   Procedure Note :     Procedure :  Ear irrigation   Indication:  Cerumen impaction (left)   Risks, including pain, dizziness, eardrum perforation, bleeding, infection and others as well as benefits were explained to the patient in detail. Verbal consent was obtained and the patient agreed to proceed.    We used "The Elephant Ear Irrigation Device" filled with lukewarm water for irrigation. Also attempted to remove was with an ear loop. Procedure was unsuccessful.  Not Tolerated well. Complain of pain and dizziness during irrigation.  Dizziness resolved after discontinuation of irrigation.  Assessment & Plan:   Susan [name] was seen today for ear pain.  Diagnoses and all orders for this visit:  Bilateral impacted cerumen -     carbamide peroxide (DEBROX) 6.5 % OTIC solution; Place 5 drops into both ears at bedtime.   I am having Susan [name] start on carbamide peroxide. I am also having her maintain her fexofenadine, aspirin EC, acetaminophen, IRON PO, Vitamin C, loperamide, famotidine, diphenoxylate-atropine, Omega-3 Fusion, cholestyramine, losartan, Vitamin D (Ergocalciferol), ALPRAZolam, metoprolol succinate, potassium chloride SA, atorvastatin, furosemide, and dicyclomine.  Meds ordered this encounter  Medications  . carbamide peroxide (DEBROX) 6.5 % OTIC solution    Sig: Place 5 drops into both ears at bedtime.    Dispense:  15 mL    Refill:  0    Order Specific Question:   Supervising Provider    Answer:   Lucille Passy [[id number]]    Problem List Items Addressed This Visit    None    Visit Diagnoses    Bilateral impacted cerumen    -  Primary   Relevant Medications   carbamide  peroxide (DEBROX) 6.5 % OTIC solution       Follow-up: Return in about 1 week (around 05/06/2019) for ear irrigation.  Wilfred Lacy, NP

## 2019-04-29 NOTE — Patient Instructions (Addendum)
Return to office for ear irrigation on Tuesday.   Earwax Buildup, Adult The ears produce a substance called earwax that helps keep bacteria out of the ear and protects the skin in the ear canal. Occasionally, earwax can build up in the ear and cause discomfort or hearing loss. What increases the risk? This condition is more likely to develop in people who:  Are female.  Are elderly.  Naturally produce more earwax.  Clean their ears often with cotton swabs.  Use earplugs often.  Use in-ear headphones often.  Wear hearing aids.  Have narrow ear canals.  Have earwax that is overly thick or sticky.  Have eczema.  Are dehydrated.  Have excess hair in the ear canal. What are the signs or symptoms? Symptoms of this condition include:  Reduced or muffled hearing.  A feeling of fullness in the ear or feeling that the ear is plugged.  Fluid coming from the ear.  Ear pain.  Ear itch.  Ringing in the ear.  Coughing.  An obvious piece of earwax that can be seen inside the ear canal. How is this diagnosed? This condition may be diagnosed based on:  Your symptoms.  Your medical history.  An ear exam. During the exam, your health care provider will look into your ear with an instrument called an otoscope. You may have tests, including a hearing test. How is this treated? This condition may be treated by:  Using ear drops to soften the earwax.  Having the earwax removed by a health care provider. The health care provider may: ? Flush the ear with water. ? Use an instrument that has a loop on the end (curette). ? Use a suction device.  Surgery to remove the wax buildup. This may be done in severe cases. Follow these instructions at home:   Take over-the-counter and prescription medicines only as told by your health care provider.  Do not put any objects, including cotton swabs, into your ear. You can clean the opening of your ear canal with a washcloth or facial  tissue.  Follow instructions from your health care provider about cleaning your ears. Do not over-clean your ears.  Drink enough fluid to keep your urine clear or pale yellow. This will help to thin the earwax.  Keep all follow-up visits as told by your health care provider. If earwax builds up in your ears often or if you use hearing aids, consider seeing your health care provider for routine, preventive ear cleanings. Ask your health care provider how often you should schedule your cleanings.  If you have hearing aids, clean them according to instructions from the manufacturer and your health care provider. Contact a health care provider if:  You have ear pain.  You develop a fever.  You have blood, pus, or other fluid coming from your ear.  You have hearing loss.  You have ringing in your ears that does not go away.  Your symptoms do not improve with treatment.  You feel like the room is spinning (vertigo). Summary  Earwax can build up in the ear and cause discomfort or hearing loss.  The most common symptoms of this condition include reduced or muffled hearing and a feeling of fullness in the ear or feeling that the ear is plugged.  This condition may be diagnosed based on your symptoms, your medical history, and an ear exam.  This condition may be treated by using ear drops to soften the earwax or by having the earwax removed  by a health care provider.  Do not put any objects, including cotton swabs, into your ear. You can clean the opening of your ear canal with a washcloth or facial tissue. This information is not intended to replace advice given to you by your health care provider. Make sure you discuss any questions you have with your health care provider. Document Released: 12/28/2004 Document Revised: 11/01/2017 Document Reviewed: 01/31/2017 Elsevier Interactive Patient Education  2019 [employer].

## 2019-05-02 ENCOUNTER — Other Ambulatory Visit: Payer: Self-pay | Admitting: Nurse Practitioner

## 2019-05-02 DIAGNOSIS — F418 Other specified anxiety disorders: Secondary | ICD-10-CM

## 2019-05-02 NOTE — Telephone Encounter (Signed)
Called pharmacy made sure pt had refills. Pt aware that pharmacy has 3 prescriptions ready to be picked up.

## 2019-05-02 NOTE — Telephone Encounter (Signed)
Called to let pt know rx was sent and she was curious as to if we sent in rx for metoprolol. I advised pt that she should have a refill at pharmacy, but that I would try to call pharmacy to make sure. Pharmacy was on lunch. Will try again.

## 2019-05-02 NOTE — Telephone Encounter (Signed)
Susan [name] please advise, last PMP check : 3/2, 4/1 and 4/29 for 30 tab at [facility]. Last refills send was 03/04/2019 for 2 mo supply.

## 2019-05-06 ENCOUNTER — Ambulatory Visit: Payer: Medicare Other | Admitting: Nurse Practitioner

## 2019-05-06 ENCOUNTER — Ambulatory Visit (INDEPENDENT_AMBULATORY_CARE_PROVIDER_SITE_OTHER): Payer: Medicare Other | Admitting: Nurse Practitioner

## 2019-05-06 ENCOUNTER — Encounter: Payer: Self-pay | Admitting: Nurse Practitioner

## 2019-05-06 ENCOUNTER — Other Ambulatory Visit: Payer: Self-pay

## 2019-05-06 VITALS — BP 140/72 | HR 61 | Temp 98.2°F | Ht 61.0 in | Wt 111.2 lb

## 2019-05-06 DIAGNOSIS — H6123 Impacted cerumen, bilateral: Secondary | ICD-10-CM

## 2019-05-06 NOTE — Patient Instructions (Signed)
Tm and ear canal is intact. All cerumen removed.  Continue current medications. F/up in 17months

## 2019-05-06 NOTE — Progress Notes (Signed)
Subjective:  Patient ID: Susan [name], female    DOB: Feb 18, 1924  Age: 85 y.o. MRN: [id number]  CC: Follow-up (follow up up left ear--pt report better since last ov)  HPI Susan [name] is here for re evaluation of ear pain and cerumen evaluation. She has use debrox ears drops for the last 5days. She deineis any change in symptoms.  Reviewed past Medical, Social and Family history today.  Outpatient Medications Prior to Visit  Medication Sig Dispense Refill  . acetaminophen (TYLENOL) 650 MG CR tablet Take 650 mg by mouth every 8 (eight) hours as needed for pain.    Marland Kitchen ALPRAZolam (XANAX) 0.5 MG tablet TAKE 1 TABLET BY MOUTH ONCE DAILY AT BEDTIME AS NEEDED FOR ANXIETY 30 tablet 1  . Ascorbic Acid (VITAMIN C) 250 MG CHEW Chew 250 mg by mouth 3 (three) times a week. 2 tab 3 time a week    . aspirin EC 325 MG tablet Take 325 mg by mouth daily.    Marland Kitchen atorvastatin (LIPITOR) 10 MG tablet Take 1 tablet (10 mg total) by mouth daily. 90 tablet 1  . carbamide peroxide (DEBROX) 6.5 % OTIC solution Place 5 drops into both ears at bedtime. 15 mL 0  . cholestyramine (QUESTRAN) 4 g packet Take 1 packet (4 g total) by mouth daily. 90 each 3  . dicyclomine (BENTYL) 10 MG capsule Take 1 capsule (10 mg total) by mouth 2 (two) times daily. 180 capsule 1  . diphenoxylate-atropine (LOMOTIL) 2.5-0.025 MG tablet Take 1 tablet by mouth 3 (three) times daily as needed for diarrhea or loose stools. 90 tablet 1  . famotidine (PEPCID) 20 MG tablet TAKE 1 TABLET BY MOUTH TWICE DAILY 180 tablet 1  . fexofenadine (ALLEGRA) 180 MG tablet Take 180 mg by mouth daily.    . furosemide (LASIX) 20 MG tablet Take 1 tablet (20 mg total) by mouth daily. 90 tablet 0  . IRON PO Take 65 mg by mouth 3 (three) times a week.    . loperamide (IMODIUM) 2 MG capsule Take by mouth as needed for diarrhea or loose stools.    Marland Kitchen losartan (COZAAR) 100 MG tablet Take 1 tablet (100 mg total) by mouth daily. 90 tablet 1  . metoprolol succinate  (TOPROL-XL) 25 MG 24 hr tablet Take 1 tablet (25 mg total) by mouth daily. 90 tablet 1  . Omega 3-6-9 Fatty Acids (OMEGA-3 FUSION) LIQD Take by mouth. 2840 mg, take one teaspoon daily    . potassium chloride SA (K-DUR,KLOR-CON) 20 MEQ tablet Take 1 tablet (20 mEq total) by mouth daily. 90 tablet 0  . Vitamin D, Ergocalciferol, (DRISDOL) 1.25 MG (50000 UT) CAPS capsule Take 1 capsule by mouth once a week 12 capsule 0   No facility-administered medications prior to visit.     ROS See HPI  Objective:  BP 140/72 (BP Location: Right Arm, Cuff Size: Normal)   Pulse 61   Temp 98.2 F (36.8 C) (Oral)   Ht 5\' 1"  (1.549 m)   Wt 111 lb 3.2 oz (50.4 kg)   SpO2 97%   BMI 21.01 kg/m   BP Readings from Last 3 Encounters:  05/06/19 140/72  04/29/19 (!) 156/78  04/21/19 (!) 141/86    Wt Readings from Last 3 Encounters:  05/06/19 111 lb 3.2 oz (50.4 kg)  04/29/19 115 lb (52.2 kg)  03/24/19 111 lb (50.3 kg)    Physical Exam Vitals signs reviewed.  HENT:     Right Ear: Tympanic membrane, ear  canal and external ear normal.     Left Ear: Tympanic membrane, ear canal and external ear normal.     Ears:     Comments: Ear exam post cerumen removal Eyes:     Extraocular Movements: Extraocular movements intact.     Conjunctiva/sclera: Conjunctivae normal.  Neck:     Musculoskeletal: Normal range of motion and neck supple.  Cardiovascular:     Rate and Rhythm: Normal rate.     Pulses: Normal pulses.  Lymphadenopathy:     Cervical: No cervical adenopathy.  Neurological:     Mental Status: She is alert and oriented to person, place, and time.  Psychiatric:        Mood and Affect: Mood normal.        Behavior: Behavior normal.        Thought Content: Thought content normal.    Lab Results  Component Value Date   WBC 9.0 12/24/2018   HGB 13.3 12/24/2018   HCT 40.0 12/24/2018   PLT 328.0 12/24/2018   GLUCOSE 109 (H) 12/24/2018   CHOL 227 (H) 05/01/2018   TRIG 213.0 (H) 05/01/2018    HDL 80.10 05/01/2018   LDLDIRECT 102.0 05/01/2018   LDLCALC 104 (H) 10/16/2017   ALT 19 12/06/2018   AST 23 12/06/2018   NA 140 12/24/2018   K 4.1 12/24/2018   CL 101 12/24/2018   CREATININE 0.55 12/24/2018   BUN 10 12/24/2018   CO2 30 12/24/2018   TSH 1.77 10/16/2017   HGBA1C 5.6 05/01/2018    Procedure Note :     Procedure :  Ear irrigation   Indication:  Cerumen impaction (bilateral)   Risks, including pain, dizziness, eardrum perforation, bleeding, infection and others as well as benefits were explained to the patient in detail. Verbal consent was obtained and the patient agreed to proceed.    We used "The Elephant Ear Irrigation Device" filled with lukewarm water for irrigation. A large amount wax was recovered. Procedure has also required manual wax removal with an ear loop.   Tolerated well. Complications: None.   Postprocedure instructions :  Call if problems.   Assessment & Plan:   she reported resolved ear pain and improved hearing after cerumen removal.  [name] was seen today for follow-up.  Diagnoses and all orders for this visit:  Bilateral hearing loss due to cerumen impaction   I am having Stacie Acres maintain her fexofenadine, aspirin EC, acetaminophen, IRON PO, Vitamin C, loperamide, famotidine, diphenoxylate-atropine, Omega-3 Fusion, cholestyramine, losartan, Vitamin D (Ergocalciferol), metoprolol succinate, potassium chloride SA, atorvastatin, furosemide, dicyclomine, carbamide peroxide, and ALPRAZolam.  No orders of the defined types were placed in this encounter.   Problem List Items Addressed This Visit    None    Visit Diagnoses    Bilateral hearing loss due to cerumen impaction    -  Primary       Follow-up: Return in about 3 months (around 08/06/2019) for HTN (F2F).  Wilfred Lacy, NP

## 2019-05-07 ENCOUNTER — Ambulatory Visit: Payer: Medicare Other | Admitting: Internal Medicine

## 2019-05-07 ENCOUNTER — Encounter: Payer: Self-pay | Admitting: Nurse Practitioner

## 2019-05-19 ENCOUNTER — Ambulatory Visit: Payer: Self-pay

## 2019-05-19 ENCOUNTER — Encounter: Payer: Self-pay | Admitting: Family Medicine

## 2019-05-19 ENCOUNTER — Ambulatory Visit (INDEPENDENT_AMBULATORY_CARE_PROVIDER_SITE_OTHER): Payer: Medicare Other | Admitting: Family Medicine

## 2019-05-19 ENCOUNTER — Other Ambulatory Visit: Payer: Self-pay

## 2019-05-19 VITALS — BP 122/78 | HR 67 | Ht 61.0 in

## 2019-05-19 DIAGNOSIS — M7061 Trochanteric bursitis, right hip: Secondary | ICD-10-CM | POA: Diagnosis not present

## 2019-05-19 DIAGNOSIS — M25551 Pain in right hip: Secondary | ICD-10-CM

## 2019-05-19 DIAGNOSIS — M25552 Pain in left hip: Secondary | ICD-10-CM

## 2019-05-19 DIAGNOSIS — M7062 Trochanteric bursitis, left hip: Secondary | ICD-10-CM

## 2019-05-19 NOTE — Assessment & Plan Note (Signed)
Patient given repeat injections again today.  Patient is getting rebound to mom sonogram.  We will continue tender padding.  Patient is having atrophy of the surrounding musculature in the area and encourage patient that we need to consider the possibility of CT scan of the back with possible epidurals for more of a radicular symptoms.  Concern with adding to any medication secondary to patient being a large fall risk.  Patient is in agreement with the plan but wants to continue with these injections because of their low risk and high reward.  Patient will follow-up with me again in 2 months.

## 2019-05-19 NOTE — Patient Instructions (Signed)
good to see you  Stay active See me again in 2 months

## 2019-05-19 NOTE — Progress Notes (Signed)
[name] [place], [place] [address] Phone: [phone] Subjective:     CC: Bilateral hip pain  GQQ:[id number]  Susan [name] is a 85 y.o. female coming in with complaint of bilateral hip pain. States that she has been in a bit of pain.  Patient was seen 8 weeks ago and had injections bilaterally.  Did respond well to them.  Started having increasing pain over the last 2 weeks.  Patient states it is severe overall.  Significant throughout the night.  Trying to sleep on a foam pillow but finds it difficult     Past Medical History:  Diagnosis Date  . Actinic keratosis, hx of    chest wall (2012)  . Allergic rhinitis   . Anxiety   . Arthritis    "spine, hips" (01/30/2017)  . Bursitis   . Cellulitis of right foot 01/30/2017  . Colon polyps   . Diverticulosis   . Esophageal dysmotilities 10/16/2017  . Fibromyalgia    "qwhere" (01/30/2017)  . Gallstones   . GERD (gastroesophageal reflux disease)   . High cholesterol   . History of kidney stones   . History of stomach ulcers   . Hx of squamous cell carcinoma excision    right mandible (2011), right arm (2014)  . Hypertension   . IBS (irritable bowel syndrome)   . Osteoarthritis   . Pneumonia 2000s X 2   "twice" (01/30/2017)  . Pyloric stenosis   . Stroke [facility]) 10/2014   denies residual on 01/30/2017   Past Surgical History:  Procedure Laterality Date  . APPENDECTOMY    . CARDIOVASCULAR STRESS TEST  04/2016   nuclear cardiolite stress test done in [place] (no ischemia, no LVH, no LV systolic function)  . CATARACT EXTRACTION W/ INTRAOCULAR LENS  IMPLANT, BILATERAL Bilateral   . CHOLECYSTECTOMY OPEN    . COLONOSCOPY  2009, 2014  . DIAGNOSTIC MAMMOGRAM  2009, 2012  . TONSILLECTOMY     Social History   Socioeconomic History  . Marital status: Widowed    Spouse name: Not on file  . Number of children: 4  . Years of education: Not on file  . Highest education level: Not on  file  Occupational History  . Occupation: retired  Scientific laboratory technician  . Financial resource strain: Not on file  . Food insecurity    Worry: Not on file    Inability: Not on file  . Transportation needs    Medical: Not on file    Non-medical: Not on file  Tobacco Use  . Smoking status: Never Smoker  . Smokeless tobacco: Never Used  Substance and Sexual Activity  . Alcohol use: Yes    Comment: glass of red wine maybe once week  . Drug use: No  . Sexual activity: Not on file  Lifestyle  . Physical activity    Days per week: Not on file    Minutes per session: Not on file  . Stress: Not on file  Relationships  . Social Herbalist on phone: Not on file    Gets together: Not on file    Attends religious service: Not on file    Active member of club or organization: Not on file    Attends meetings of clubs or organizations: Not on file    Relationship status: Not on file  Other Topics Concern  . Not on file  Social History Narrative  . Not on file   Allergies  Allergen Reactions  . Butrans [Buprenorphine] Other (See Comments)    Severe chest pains  . Codeine Other (See Comments)    Severe Chest pains  . Morphine And Related Other (See Comments)    Severe chest pains   Family History  Problem Relation Age of Onset  . Stroke Mother   . Heart attack Mother   . Stroke Father   . Heart attack Father   . Heart attack Maternal Grandmother   . Stroke Maternal Grandmother   . Stroke Paternal Grandmother   . Heart attack Paternal Grandmother   . Heart attack Maternal Grandfather   . Stroke Maternal Grandfather   . Stroke Paternal Grandfather   . Heart attack Paternal Grandfather   . Colon cancer Neg Hx   . Pancreatic cancer Neg Hx   . Stomach cancer Neg Hx   . Esophageal cancer Neg Hx      Current Outpatient Medications (Cardiovascular):  .  atorvastatin (LIPITOR) 10 MG tablet, Take 1 tablet (10 mg total) by mouth daily. .  cholestyramine (QUESTRAN) 4 g  packet, Take 1 packet (4 g total) by mouth daily. .  furosemide (LASIX) 20 MG tablet, Take 1 tablet (20 mg total) by mouth daily. Marland Kitchen  losartan (COZAAR) 100 MG tablet, Take 1 tablet (100 mg total) by mouth daily. .  metoprolol succinate (TOPROL-XL) 25 MG 24 hr tablet, Take 1 tablet (25 mg total) by mouth daily.  Current Outpatient Medications (Respiratory):  .  fexofenadine (ALLEGRA) 180 MG tablet, Take 180 mg by mouth daily.  Current Outpatient Medications (Analgesics):  .  acetaminophen (TYLENOL) 650 MG CR tablet, Take 650 mg by mouth every 8 (eight) hours as needed for pain. Marland Kitchen  aspirin EC 325 MG tablet, Take 325 mg by mouth daily.  Current Outpatient Medications (Hematological):  Marland Kitchen  IRON PO, Take 65 mg by mouth 3 (three) times a week.  Current Outpatient Medications (Other):  Marland Kitchen  ALPRAZolam (XANAX) 0.5 MG tablet, TAKE 1 TABLET BY MOUTH ONCE DAILY AT BEDTIME AS NEEDED FOR ANXIETY .  Ascorbic Acid (VITAMIN C) 250 MG CHEW, Chew 250 mg by mouth 3 (three) times a week. 2 tab 3 time a week .  carbamide peroxide (DEBROX) 6.5 % OTIC solution, Place 5 drops into both ears at bedtime. .  dicyclomine (BENTYL) 10 MG capsule, Take 1 capsule (10 mg total) by mouth 2 (two) times daily. .  diphenoxylate-atropine (LOMOTIL) 2.5-0.025 MG tablet, Take 1 tablet by mouth 3 (three) times daily as needed for diarrhea or loose stools. .  famotidine (PEPCID) 20 MG tablet, TAKE 1 TABLET BY MOUTH TWICE DAILY .  loperamide (IMODIUM) 2 MG capsule, Take by mouth as needed for diarrhea or loose stools. .  Omega 3-6-9 Fatty Acids (OMEGA-3 FUSION) LIQD, Take by mouth. 2840 mg, take one teaspoon daily .  potassium chloride SA (K-DUR,KLOR-CON) 20 MEQ tablet, Take 1 tablet (20 mEq total) by mouth daily. .  Vitamin D, Ergocalciferol, (DRISDOL) 1.25 MG (50000 UT) CAPS capsule, Take 1 capsule by mouth once a week    Past medical history, social, surgical and family history all reviewed in electronic medical record.  No  pertanent information unless stated regarding to the chief complaint.   Review of Systems:  No headache, visual changes, nausea, vomiting, diarrhea, constipation, dizziness, abdominal pain, skin rash, fevers, chills, night sweats, weight loss, swollen lymph nodes,, chest pain, shortness of breath, mood changes.  Positive muscle aches, body aches  Objective  Blood pressure 122/78, pulse 67,  height 5\' 1"  (1.549 m), SpO2 99 %.    General: No apparent distress alert and oriented x3 mood and affect normal, dressed appropriately.  HEENT: Pupils equal, extraocular movements intact  Respiratory: Patient's speak in full sentences and does not appear short of breath  Cardiovascular: No lower extremity edema, non tender, no erythema  Skin: Warm dry intact with no signs of infection or rash on extremities or on axial skeleton.  Abdomen: Soft nontender  Neuro: Cranial nerves II through XII are intact, neurovascularly intact in all extremities with 2+ DTRs and 2+ pulses.  Lymph: No lymphadenopathy of posterior or anterior cervical chain or axillae bilaterally.  Gait antalgic with aid of walker.  MSK:  tender with arthritic changes of multiple joints.  Bilateral hand exam shows atrophy of the musculature of the lateral aspect hip.  Patient does have tightness in all planes.  Does have arthritic changes be decreasing internal range of motion of the hip feels well severe pain over the greater trochanteric area bilaterally.  With mild pain in the back area.   Procedure: Real-time Ultrasound Guided Injection of right greater trochanteric bursitis secondary to patient's body habitus Device: GE Logiq Q7 Ultrasound guided injection is preferred based studies that show increased duration, increased effect, greater accuracy, decreased procedural pain, increased response rate, and decreased cost with ultrasound guided versus blind injection.  Verbal informed consent obtained.  Time-out conducted.  Noted no  overlying erythema, induration, or other signs of local infection.  Skin prepped in a sterile fashion.  Local anesthesia: Topical Ethyl chloride.  With sterile technique and under real time ultrasound guidance:  Greater trochanteric area was visualized and patient's bursa was noted. A 22-gauge 3 inch needle was inserted and 4 cc of 0.5% Marcaine and 1 cc of Kenalog 40 mg/dL was injected. Pictures taken Completed without difficulty  Pain immediately resolved suggesting accurate placement of the medication.  Advised to call if fevers/chills, erythema, induration, drainage, or persistent bleeding.  Images permanently stored and available for review in the ultrasound unit.  Impression: Technically successful ultrasound guided injection.    Procedure: Real-time Ultrasound Guided Injection of left  greater trochanteric bursitis secondary to patient's body habitus Device: GE Logiq Q7  Ultrasound guided injection is preferred based studies that show increased duration, increased effect, greater accuracy, decreased procedural pain, increased response rate, and decreased cost with ultrasound guided versus blind injection.  Verbal informed consent obtained.  Time-out conducted.  Noted no overlying erythema, induration, or other signs of local infection.  Skin prepped in a sterile fashion.  Local anesthesia: Topical Ethyl chloride.  With sterile technique and under real time ultrasound guidance:  Greater trochanteric area was visualized and patient's bursa was noted. A 22-gauge 3 inch needle was inserted and 4 cc of 0.5% Marcaine and 1 cc of Kenalog 40 mg/dL was injected. Pictures taken Completed without difficulty  Pain immediately resolved suggesting accurate placement of the medication.  Advised to call if fevers/chills, erythema, induration, drainage, or persistent bleeding.  Images permanently stored and available for review in the ultrasound unit.  Impression: Technically successful ultrasound  guided injection.    Impression and Recommendations:     This case required medical decision making of moderate complexity. The above documentation has been reviewed and is accurate and complete Lyndal Pulley, DO       Note: This dictation was prepared with Dragon dictation along with smaller phrase technology. Any transcriptional errors that result from this process are unintentional.

## 2019-06-24 ENCOUNTER — Other Ambulatory Visit: Payer: Self-pay | Admitting: Nurse Practitioner

## 2019-06-24 DIAGNOSIS — I872 Venous insufficiency (chronic) (peripheral): Secondary | ICD-10-CM

## 2019-06-24 DIAGNOSIS — I10 Essential (primary) hypertension: Secondary | ICD-10-CM

## 2019-06-26 ENCOUNTER — Emergency Department (HOSPITAL_COMMUNITY)
Admission: EM | Admit: 2019-06-26 | Discharge: 2019-06-26 | Disposition: A | Discharge status: Home / Self Care | Payer: Medicare Other | Attending: Emergency Medicine | Admitting: Emergency Medicine

## 2019-06-26 ENCOUNTER — Emergency Department (HOSPITAL_COMMUNITY): Payer: Medicare Other

## 2019-06-26 DIAGNOSIS — Y92008 Other place in unspecified non-institutional (private) residence as the place of occurrence of the external cause: Secondary | ICD-10-CM | POA: Insufficient documentation

## 2019-06-26 DIAGNOSIS — W208XXA Other cause of strike by thrown, projected or falling object, initial encounter: Secondary | ICD-10-CM | POA: Insufficient documentation

## 2019-06-26 DIAGNOSIS — Z79899 Other long term (current) drug therapy: Secondary | ICD-10-CM | POA: Insufficient documentation

## 2019-06-26 DIAGNOSIS — I10 Essential (primary) hypertension: Secondary | ICD-10-CM | POA: Insufficient documentation

## 2019-06-26 DIAGNOSIS — Y9389 Activity, other specified: Secondary | ICD-10-CM | POA: Diagnosis not present

## 2019-06-26 DIAGNOSIS — S91311A Laceration without foreign body, right foot, initial encounter: Secondary | ICD-10-CM | POA: Diagnosis not present

## 2019-06-26 DIAGNOSIS — Z7982 Long term (current) use of aspirin: Secondary | ICD-10-CM | POA: Insufficient documentation

## 2019-06-26 DIAGNOSIS — Y999 Unspecified external cause status: Secondary | ICD-10-CM | POA: Insufficient documentation

## 2019-06-26 DIAGNOSIS — S90121A Contusion of right lesser toe(s) without damage to nail, initial encounter: Secondary | ICD-10-CM | POA: Diagnosis not present

## 2019-06-26 DIAGNOSIS — M5489 Other dorsalgia: Secondary | ICD-10-CM | POA: Diagnosis not present

## 2019-06-26 DIAGNOSIS — S99921A Unspecified injury of right foot, initial encounter: Secondary | ICD-10-CM | POA: Diagnosis present

## 2019-06-26 DIAGNOSIS — R52 Pain, unspecified: Secondary | ICD-10-CM | POA: Diagnosis not present

## 2019-06-26 DIAGNOSIS — W19XXXA Unspecified fall, initial encounter: Secondary | ICD-10-CM | POA: Diagnosis not present

## 2019-06-26 MED ORDER — CEPHALEXIN 500 MG PO CAPS: 500.0000 mg | ORAL_CAPSULE | Freq: Three times a day (TID) | ORAL | 0 refills | Status: DC

## 2019-06-26 NOTE — ED Triage Notes (Signed)
Pt to ED via EMS from home. With c/o right foot laceration from dropping ironing board on foot. Pt did not fall, No LOC. A&O x 4. Bleeding currently controlled by EMS. Pt currently taking aspirin. Hx HTN, EMS noted swelling to left foot- but pt reports this is an old problem .

## 2019-06-26 NOTE — Discharge Instructions (Addendum)
Keflex as prescribed.  Local wound care with bacitracin and dressing changes once or twice daily.  Return to the emergency department for increased swelling, pus draining from the wound, fevers, increased pain, or other new and concerning symptoms.

## 2019-06-26 NOTE — ED Notes (Signed)
This nurse called pt daughter Hilda Blades, per pt request. Daughter updated on POC and discharge summary. Informs nurse she is on her way to pick pt up for discharge ride home.

## 2019-06-26 NOTE — ED Notes (Addendum)
Pt daughter here for discharge ride home. Discharge summary has been reviewed with pt. RX provided. Pt off unit via WC.

## 2019-06-26 NOTE — ED Provider Notes (Signed)
[facility] DEPT Provider Note   CSN: [id number] Arrival date & time: 06/26/19  1632     History   Chief Complaint Chief Complaint  Patient presents with  . Foot Injury    right     HPI Susan [name] is a 85 y.o. female.     Patient is a 85 year old female presenting with complaints of right foot injury.  She apparently dropped an ironing board on her foot and caused a laceration.  The history is provided by the patient.  Foot Injury Location:  Foot Time since incident:  1 hour Foot location:  R foot Pain details:    Quality:  Throbbing   Radiates to:  Does not radiate   Severity:  Moderate   Onset quality:  Sudden Relieved by:  Nothing Worsened by:  Nothing   Past Medical History:  Diagnosis Date  . Actinic keratosis, hx of    chest wall (2012)  . Allergic rhinitis   . Anxiety   . Arthritis    "spine, hips" (01/30/2017)  . Bursitis   . Cellulitis of right foot 01/30/2017  . Colon polyps   . Diverticulosis   . Esophageal dysmotilities 10/16/2017  . Fibromyalgia    "qwhere" (01/30/2017)  . Gallstones   . GERD (gastroesophageal reflux disease)   . High cholesterol   . History of kidney stones   . History of stomach ulcers   . Hx of squamous cell carcinoma excision    right mandible (2011), right arm (2014)  . Hypertension   . IBS (irritable bowel syndrome)   . Osteoarthritis   . Pneumonia 2000s X 2   "twice" (01/30/2017)  . Pyloric stenosis   . Stroke [facility]) 10/2014   denies residual on 01/30/2017    Patient Active Problem List   Diagnosis Date Noted  . DNR (do not resuscitate) discussion 02/20/2019  . Anemia associated with acute blood loss 12/09/2018  . Lymphangitis 12/06/2018  . Chronic diarrhea 12/03/2018  . Venous insufficiency of both lower extremities 11/06/2018  . Hyperglycemia 05/01/2018  . Contusion of left hip 04/22/2018  . Esophageal dysmotilities 10/16/2017  . Anxiety about health 05/16/2017  . Hearing  loss 05/16/2017  . Greater trochanteric bursitis of both hips 04/24/2017  . Sicca syndrome, unspecified ([place]) 04/06/2017  . Pyloric stenosis 04/06/2017  . Hyperlipidemia 04/06/2017  . Macular degeneration 04/06/2017  . Fibromyalgia 03/21/2017  . CVA (cerebral vascular accident) ([place]) 03/21/2017  . IBS (irritable bowel syndrome) 03/16/2017  . Dysphagia 03/16/2017  . GERD with esophagitis 03/16/2017  . Arthritis of hip 02/13/2017  . Essential hypertension 01/30/2017  . Elevated troponin 01/30/2017    Past Surgical History:  Procedure Laterality Date  . APPENDECTOMY    . CARDIOVASCULAR STRESS TEST  04/2016   nuclear cardiolite stress test done in [place] (no ischemia, no LVH, no LV systolic function)  . CATARACT EXTRACTION W/ INTRAOCULAR LENS  IMPLANT, BILATERAL Bilateral   . CHOLECYSTECTOMY OPEN    . COLONOSCOPY  2009, 2014  . DIAGNOSTIC MAMMOGRAM  2009, 2012  . TONSILLECTOMY       OB History   No obstetric history on file.      Home Medications    Prior to Admission medications   Medication Sig Start Date End Date Taking? Authorizing Provider  acetaminophen (TYLENOL) 650 MG CR tablet Take 650 mg by mouth every 8 (eight) hours as needed for pain.    [provider]  ALPRAZolam Duanne Moron) 0.5 MG tablet TAKE 1 TABLET  BY MOUTH ONCE DAILY AT BEDTIME AS NEEDED FOR ANXIETY 05/02/19   Nche, Charlene Brooke, NP  Ascorbic Acid (VITAMIN C) 250 MG CHEW Chew 250 mg by mouth 3 (three) times a week. 2 tab 3 time a week    [provider]  aspirin EC 325 MG tablet Take 325 mg by mouth daily.    [provider]  atorvastatin (LIPITOR) 10 MG tablet Take 1 tablet (10 mg total) by mouth daily. 03/10/19   Nche, Charlene Brooke, NP  carbamide peroxide (DEBROX) 6.5 % OTIC solution Place 5 drops into both ears at bedtime. 04/29/19   Nche, Charlene Brooke, NP  cholestyramine (QUESTRAN) 4 g packet Take 1 packet (4 g total) by mouth daily. 12/09/18   Biagio Borg, MD  dicyclomine  (BENTYL) 10 MG capsule Take 1 capsule (10 mg total) by mouth 2 (two) times daily. 04/21/19   Nche, Charlene Brooke, NP  diphenoxylate-atropine (LOMOTIL) 2.5-0.025 MG tablet Take 1 tablet by mouth 3 (three) times daily as needed for diarrhea or loose stools. 11/13/18   Biagio Borg, MD  famotidine (PEPCID) 20 MG tablet TAKE 1 TABLET BY MOUTH TWICE DAILY 10/29/18   Biagio Borg, MD  fexofenadine (ALLEGRA) 180 MG tablet Take 180 mg by mouth daily.    [provider]  furosemide (LASIX) 20 MG tablet Take 1 tablet by mouth once daily 06/24/19   Nche, Charlene Brooke, NP  IRON PO Take 65 mg by mouth 3 (three) times a week.    [provider]  loperamide (IMODIUM) 2 MG capsule Take by mouth as needed for diarrhea or loose stools.    [provider]  losartan (COZAAR) 100 MG tablet Take 1 tablet (100 mg total) by mouth daily. 02/19/19   Nche, Charlene Brooke, NP  metoprolol succinate (TOPROL-XL) 25 MG 24 hr tablet Take 1 tablet (25 mg total) by mouth daily. 03/10/19   Nche, Charlene Brooke, NP  Omega 3-6-9 Fatty Acids (OMEGA-3 FUSION) LIQD Take by mouth. 2840 mg, take one teaspoon daily    [provider]  potassium chloride SA (K-DUR,KLOR-CON) 20 MEQ tablet Take 1 tablet (20 mEq total) by mouth daily. 03/10/19   Nche, Charlene Brooke, NP  Vitamin D, Ergocalciferol, (DRISDOL) 1.25 MG (50000 UT) CAPS capsule Take 1 capsule by mouth once a week 03/04/19   Lyndal Pulley, DO    Family History Family History  Problem Relation Age of Onset  . Stroke Mother   . Heart attack Mother   . Stroke Father   . Heart attack Father   . Heart attack Maternal Grandmother   . Stroke Maternal Grandmother   . Stroke Paternal Grandmother   . Heart attack Paternal Grandmother   . Heart attack Maternal Grandfather   . Stroke Maternal Grandfather   . Stroke Paternal Grandfather   . Heart attack Paternal Grandfather   . Colon cancer Neg Hx   . Pancreatic cancer Neg Hx   . Stomach cancer Neg Hx    . Esophageal cancer Neg Hx     Social History Social History   Tobacco Use  . Smoking status: Never Smoker  . Smokeless tobacco: Never Used  Substance Use Topics  . Alcohol use: Yes    Comment: glass of red wine maybe once week  . Drug use: No     Allergies   Butrans [buprenorphine], Codeine, and Morphine and related   Review of Systems Review of Systems  All other systems reviewed and are negative.  Physical Exam Updated Vital Signs BP (!) 202/76   Pulse 64   Temp 98.2 F (36.8 C) (Oral)   Resp 16   Ht 5\' 1"  (1.549 m)   Wt 49.4 kg   SpO2 99%   BMI 20.60 kg/m   Physical Exam Vitals signs and nursing note reviewed.  Constitutional:      Appearance: Normal appearance.  HENT:     Head: Normocephalic.  Pulmonary:     Effort: Pulmonary effort is normal.  Musculoskeletal:     Comments: There is a 5 cm skin tear to the dorsum of the right foot.  Bleeding is controlled.  There is swelling and ecchymosis to the dorsum of the foot, however no obvious deformity.  Neurological:     Mental Status: She is alert and oriented to person, place, and time.      ED Treatments / Results  Labs (all labs ordered are listed, but only abnormal results are displayed) Labs Reviewed - No data to display  EKG None  Radiology Dg Foot Complete Right  Result Date: 06/26/2019 CLINICAL DATA:  Dropped high-riding board on foot with laceration and pain, initial encounter EXAM: RIGHT FOOT COMPLETE - 3+ VIEW COMPARISON:  None. FINDINGS: No acute fracture or dislocation is noted. Mild soft tissue swelling is noted along the metatarsal heads. Calcaneal spurring is seen. IMPRESSION: Mild soft tissue swelling without acute bony abnormality. Electronically Signed   By: Inez Catalina M.D.   On: 06/26/2019 17:25    Procedures Procedures (including critical care time)  Medications Ordered in ED Medications - No data to display   Initial Impression / Assessment and Plan / ED Course  I  have reviewed the triage vital signs and the nursing notes.  Pertinent labs & imaging results that were available during my care of the patient were reviewed by me and considered in my medical decision making (see chart for details).  X-rays are negative for fracture.  This appears to be a skin tear that I do not feel will be amenable to sutures.  This will be treated with wound care, Keflex, and follow-up as needed.  Final Clinical Impressions(s) / ED Diagnoses   Final diagnoses:  None    ED Discharge Orders    None       Veryl Speak, MD 06/26/19 [phone]

## 2019-06-26 NOTE — ED Notes (Signed)
Daughter- Duwaine Maxin [phone] Would like update when available- will be picking patient up when ready for discharge

## 2019-06-30 DIAGNOSIS — M79671 Pain in right foot: Secondary | ICD-10-CM | POA: Diagnosis not present

## 2019-06-30 DIAGNOSIS — M2041 Other hammer toe(s) (acquired), right foot: Secondary | ICD-10-CM | POA: Diagnosis not present

## 2019-06-30 DIAGNOSIS — L97512 Non-pressure chronic ulcer of other part of right foot with fat layer exposed: Secondary | ICD-10-CM | POA: Diagnosis not present

## 2019-07-03 ENCOUNTER — Other Ambulatory Visit: Payer: Self-pay | Admitting: Nurse Practitioner

## 2019-07-03 DIAGNOSIS — L97512 Non-pressure chronic ulcer of other part of right foot with fat layer exposed: Secondary | ICD-10-CM | POA: Diagnosis not present

## 2019-07-03 DIAGNOSIS — F418 Other specified anxiety disorders: Secondary | ICD-10-CM

## 2019-07-03 DIAGNOSIS — Z4800 Encounter for change or removal of nonsurgical wound dressing: Secondary | ICD-10-CM | POA: Diagnosis not present

## 2019-07-03 DIAGNOSIS — R609 Edema, unspecified: Secondary | ICD-10-CM | POA: Diagnosis not present

## 2019-07-03 DIAGNOSIS — I10 Essential (primary) hypertension: Secondary | ICD-10-CM | POA: Diagnosis not present

## 2019-07-03 NOTE — Telephone Encounter (Signed)
Baldo Ash please advise last refill 5/29 #30 1 refill. Last OV 4/6 next OV 9/1.

## 2019-07-07 ENCOUNTER — Other Ambulatory Visit: Payer: Self-pay

## 2019-07-07 ENCOUNTER — Encounter (HOSPITAL_BASED_OUTPATIENT_CLINIC_OR_DEPARTMENT_OTHER): Payer: Medicare Other | Attending: Internal Medicine

## 2019-07-07 DIAGNOSIS — I872 Venous insufficiency (chronic) (peripheral): Secondary | ICD-10-CM | POA: Insufficient documentation

## 2019-07-07 DIAGNOSIS — S91311A Laceration without foreign body, right foot, initial encounter: Secondary | ICD-10-CM | POA: Diagnosis not present

## 2019-07-07 DIAGNOSIS — I10 Essential (primary) hypertension: Secondary | ICD-10-CM | POA: Insufficient documentation

## 2019-07-07 DIAGNOSIS — L97512 Non-pressure chronic ulcer of other part of right foot with fat layer exposed: Secondary | ICD-10-CM | POA: Diagnosis not present

## 2019-07-09 DIAGNOSIS — Z4800 Encounter for change or removal of nonsurgical wound dressing: Secondary | ICD-10-CM | POA: Diagnosis not present

## 2019-07-09 DIAGNOSIS — I10 Essential (primary) hypertension: Secondary | ICD-10-CM | POA: Diagnosis not present

## 2019-07-09 DIAGNOSIS — R609 Edema, unspecified: Secondary | ICD-10-CM | POA: Diagnosis not present

## 2019-07-09 DIAGNOSIS — L97512 Non-pressure chronic ulcer of other part of right foot with fat layer exposed: Secondary | ICD-10-CM | POA: Diagnosis not present

## 2019-07-10 DIAGNOSIS — M79671 Pain in right foot: Secondary | ICD-10-CM | POA: Diagnosis not present

## 2019-07-10 DIAGNOSIS — R600 Localized edema: Secondary | ICD-10-CM | POA: Diagnosis not present

## 2019-07-10 DIAGNOSIS — I872 Venous insufficiency (chronic) (peripheral): Secondary | ICD-10-CM | POA: Diagnosis not present

## 2019-07-10 DIAGNOSIS — L97512 Non-pressure chronic ulcer of other part of right foot with fat layer exposed: Secondary | ICD-10-CM | POA: Diagnosis not present

## 2019-07-10 DIAGNOSIS — L539 Erythematous condition, unspecified: Secondary | ICD-10-CM | POA: Diagnosis not present

## 2019-07-10 DIAGNOSIS — I10 Essential (primary) hypertension: Secondary | ICD-10-CM | POA: Diagnosis not present

## 2019-07-10 DIAGNOSIS — S91311A Laceration without foreign body, right foot, initial encounter: Secondary | ICD-10-CM | POA: Diagnosis not present

## 2019-07-11 DIAGNOSIS — L97512 Non-pressure chronic ulcer of other part of right foot with fat layer exposed: Secondary | ICD-10-CM | POA: Diagnosis not present

## 2019-07-11 DIAGNOSIS — I10 Essential (primary) hypertension: Secondary | ICD-10-CM | POA: Diagnosis not present

## 2019-07-11 DIAGNOSIS — Z4800 Encounter for change or removal of nonsurgical wound dressing: Secondary | ICD-10-CM | POA: Diagnosis not present

## 2019-07-11 DIAGNOSIS — R609 Edema, unspecified: Secondary | ICD-10-CM | POA: Diagnosis not present

## 2019-07-14 DIAGNOSIS — I872 Venous insufficiency (chronic) (peripheral): Secondary | ICD-10-CM | POA: Diagnosis not present

## 2019-07-14 DIAGNOSIS — L97512 Non-pressure chronic ulcer of other part of right foot with fat layer exposed: Secondary | ICD-10-CM | POA: Diagnosis not present

## 2019-07-14 DIAGNOSIS — I10 Essential (primary) hypertension: Secondary | ICD-10-CM | POA: Diagnosis not present

## 2019-07-16 DIAGNOSIS — I10 Essential (primary) hypertension: Secondary | ICD-10-CM | POA: Diagnosis not present

## 2019-07-16 DIAGNOSIS — L97512 Non-pressure chronic ulcer of other part of right foot with fat layer exposed: Secondary | ICD-10-CM | POA: Diagnosis not present

## 2019-07-16 DIAGNOSIS — Z4800 Encounter for change or removal of nonsurgical wound dressing: Secondary | ICD-10-CM | POA: Diagnosis not present

## 2019-07-16 DIAGNOSIS — R609 Edema, unspecified: Secondary | ICD-10-CM | POA: Diagnosis not present

## 2019-07-18 DIAGNOSIS — Z4800 Encounter for change or removal of nonsurgical wound dressing: Secondary | ICD-10-CM | POA: Diagnosis not present

## 2019-07-18 DIAGNOSIS — I10 Essential (primary) hypertension: Secondary | ICD-10-CM | POA: Diagnosis not present

## 2019-07-18 DIAGNOSIS — L97512 Non-pressure chronic ulcer of other part of right foot with fat layer exposed: Secondary | ICD-10-CM | POA: Diagnosis not present

## 2019-07-18 DIAGNOSIS — R609 Edema, unspecified: Secondary | ICD-10-CM | POA: Diagnosis not present

## 2019-07-21 DIAGNOSIS — S91301A Unspecified open wound, right foot, initial encounter: Secondary | ICD-10-CM | POA: Diagnosis not present

## 2019-07-21 DIAGNOSIS — I872 Venous insufficiency (chronic) (peripheral): Secondary | ICD-10-CM | POA: Diagnosis not present

## 2019-07-21 DIAGNOSIS — I10 Essential (primary) hypertension: Secondary | ICD-10-CM | POA: Diagnosis not present

## 2019-07-21 DIAGNOSIS — L97512 Non-pressure chronic ulcer of other part of right foot with fat layer exposed: Secondary | ICD-10-CM | POA: Diagnosis not present

## 2019-07-23 ENCOUNTER — Ambulatory Visit: Payer: Medicare Other | Admitting: Family Medicine

## 2019-07-23 DIAGNOSIS — R609 Edema, unspecified: Secondary | ICD-10-CM | POA: Diagnosis not present

## 2019-07-23 DIAGNOSIS — L97512 Non-pressure chronic ulcer of other part of right foot with fat layer exposed: Secondary | ICD-10-CM | POA: Diagnosis not present

## 2019-07-23 DIAGNOSIS — I10 Essential (primary) hypertension: Secondary | ICD-10-CM | POA: Diagnosis not present

## 2019-07-23 DIAGNOSIS — Z4800 Encounter for change or removal of nonsurgical wound dressing: Secondary | ICD-10-CM | POA: Diagnosis not present

## 2019-07-25 DIAGNOSIS — L97512 Non-pressure chronic ulcer of other part of right foot with fat layer exposed: Secondary | ICD-10-CM | POA: Diagnosis not present

## 2019-07-25 DIAGNOSIS — R609 Edema, unspecified: Secondary | ICD-10-CM | POA: Diagnosis not present

## 2019-07-25 DIAGNOSIS — I10 Essential (primary) hypertension: Secondary | ICD-10-CM | POA: Diagnosis not present

## 2019-07-25 DIAGNOSIS — Z4800 Encounter for change or removal of nonsurgical wound dressing: Secondary | ICD-10-CM | POA: Diagnosis not present

## 2019-07-28 DIAGNOSIS — I10 Essential (primary) hypertension: Secondary | ICD-10-CM | POA: Diagnosis not present

## 2019-07-28 DIAGNOSIS — I872 Venous insufficiency (chronic) (peripheral): Secondary | ICD-10-CM | POA: Diagnosis not present

## 2019-07-28 DIAGNOSIS — S91311A Laceration without foreign body, right foot, initial encounter: Secondary | ICD-10-CM | POA: Diagnosis not present

## 2019-07-28 DIAGNOSIS — L97512 Non-pressure chronic ulcer of other part of right foot with fat layer exposed: Secondary | ICD-10-CM | POA: Diagnosis not present

## 2019-08-01 DIAGNOSIS — Z4800 Encounter for change or removal of nonsurgical wound dressing: Secondary | ICD-10-CM | POA: Diagnosis not present

## 2019-08-01 DIAGNOSIS — I10 Essential (primary) hypertension: Secondary | ICD-10-CM | POA: Diagnosis not present

## 2019-08-01 DIAGNOSIS — L97512 Non-pressure chronic ulcer of other part of right foot with fat layer exposed: Secondary | ICD-10-CM | POA: Diagnosis not present

## 2019-08-01 DIAGNOSIS — R609 Edema, unspecified: Secondary | ICD-10-CM | POA: Diagnosis not present

## 2019-08-02 DIAGNOSIS — R609 Edema, unspecified: Secondary | ICD-10-CM | POA: Diagnosis not present

## 2019-08-02 DIAGNOSIS — I10 Essential (primary) hypertension: Secondary | ICD-10-CM | POA: Diagnosis not present

## 2019-08-02 DIAGNOSIS — Z4800 Encounter for change or removal of nonsurgical wound dressing: Secondary | ICD-10-CM | POA: Diagnosis not present

## 2019-08-02 DIAGNOSIS — L97512 Non-pressure chronic ulcer of other part of right foot with fat layer exposed: Secondary | ICD-10-CM | POA: Diagnosis not present

## 2019-08-04 ENCOUNTER — Telehealth: Payer: Self-pay

## 2019-08-04 ENCOUNTER — Encounter: Payer: Self-pay | Admitting: Family Medicine

## 2019-08-04 DIAGNOSIS — I872 Venous insufficiency (chronic) (peripheral): Secondary | ICD-10-CM | POA: Diagnosis not present

## 2019-08-04 DIAGNOSIS — L97512 Non-pressure chronic ulcer of other part of right foot with fat layer exposed: Secondary | ICD-10-CM | POA: Diagnosis not present

## 2019-08-04 DIAGNOSIS — I10 Essential (primary) hypertension: Secondary | ICD-10-CM | POA: Diagnosis not present

## 2019-08-04 NOTE — Telephone Encounter (Signed)
Spoke with patient. Would like to be seen for ongoing foot pain following an injury in which she dropped an ironing board on her foot. On schedule for Friday. Aware of check in.

## 2019-08-05 ENCOUNTER — Encounter (HOSPITAL_BASED_OUTPATIENT_CLINIC_OR_DEPARTMENT_OTHER): Payer: Medicare Other | Attending: Internal Medicine

## 2019-08-05 ENCOUNTER — Ambulatory Visit: Payer: Medicare Other | Admitting: Nurse Practitioner

## 2019-08-05 DIAGNOSIS — I872 Venous insufficiency (chronic) (peripheral): Secondary | ICD-10-CM | POA: Insufficient documentation

## 2019-08-05 DIAGNOSIS — L97512 Non-pressure chronic ulcer of other part of right foot with fat layer exposed: Secondary | ICD-10-CM | POA: Insufficient documentation

## 2019-08-05 DIAGNOSIS — I10 Essential (primary) hypertension: Secondary | ICD-10-CM | POA: Insufficient documentation

## 2019-08-06 DIAGNOSIS — L97512 Non-pressure chronic ulcer of other part of right foot with fat layer exposed: Secondary | ICD-10-CM | POA: Diagnosis not present

## 2019-08-06 DIAGNOSIS — R609 Edema, unspecified: Secondary | ICD-10-CM | POA: Diagnosis not present

## 2019-08-06 DIAGNOSIS — I10 Essential (primary) hypertension: Secondary | ICD-10-CM | POA: Diagnosis not present

## 2019-08-06 DIAGNOSIS — Z4800 Encounter for change or removal of nonsurgical wound dressing: Secondary | ICD-10-CM | POA: Diagnosis not present

## 2019-08-06 IMAGING — DX DG LUMBAR SPINE COMPLETE 4+V
5 series · 5 of 5 positions shown · non-contrast
Comparison: None.

CLINICAL DATA: Status post fall 1 month ago.  Low back pain

EXAM:
LUMBAR SPINE - COMPLETE 4+ VIEW

[l-spine ap]
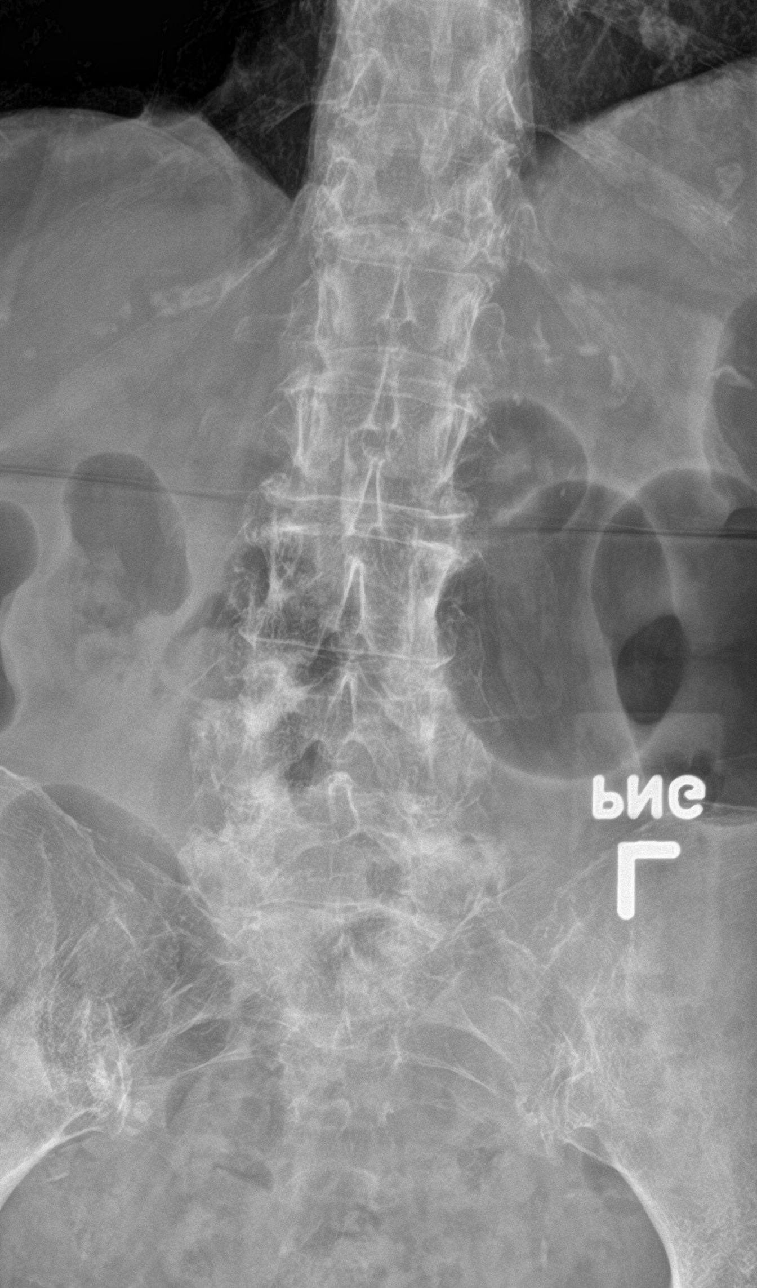

[l-spine obl (1 of 2)]
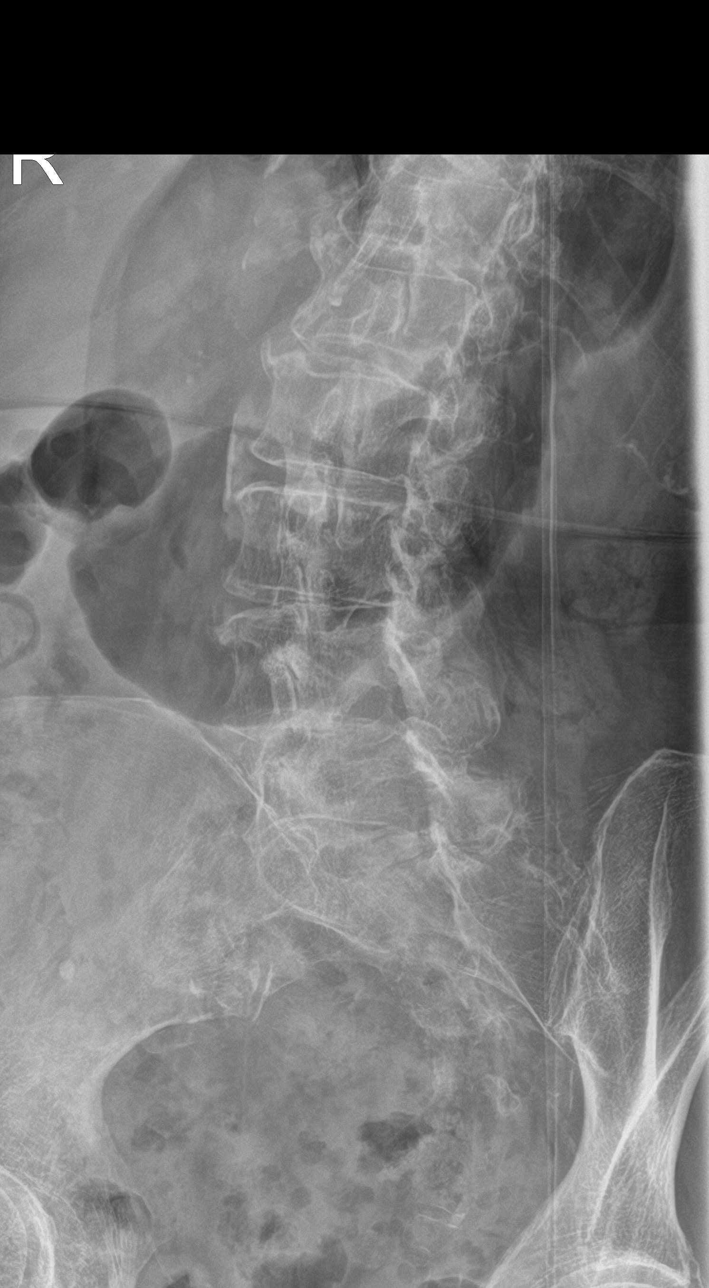

[l-spine obl (2 of 2)]
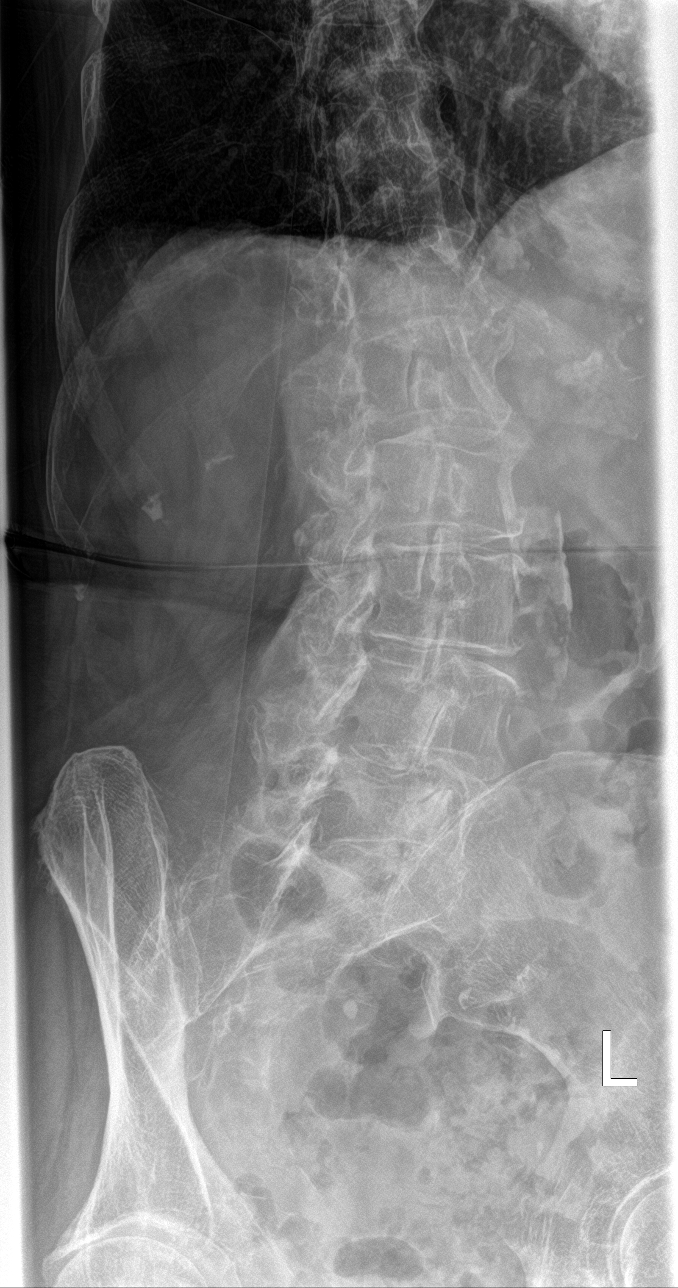

[l-spine lat]
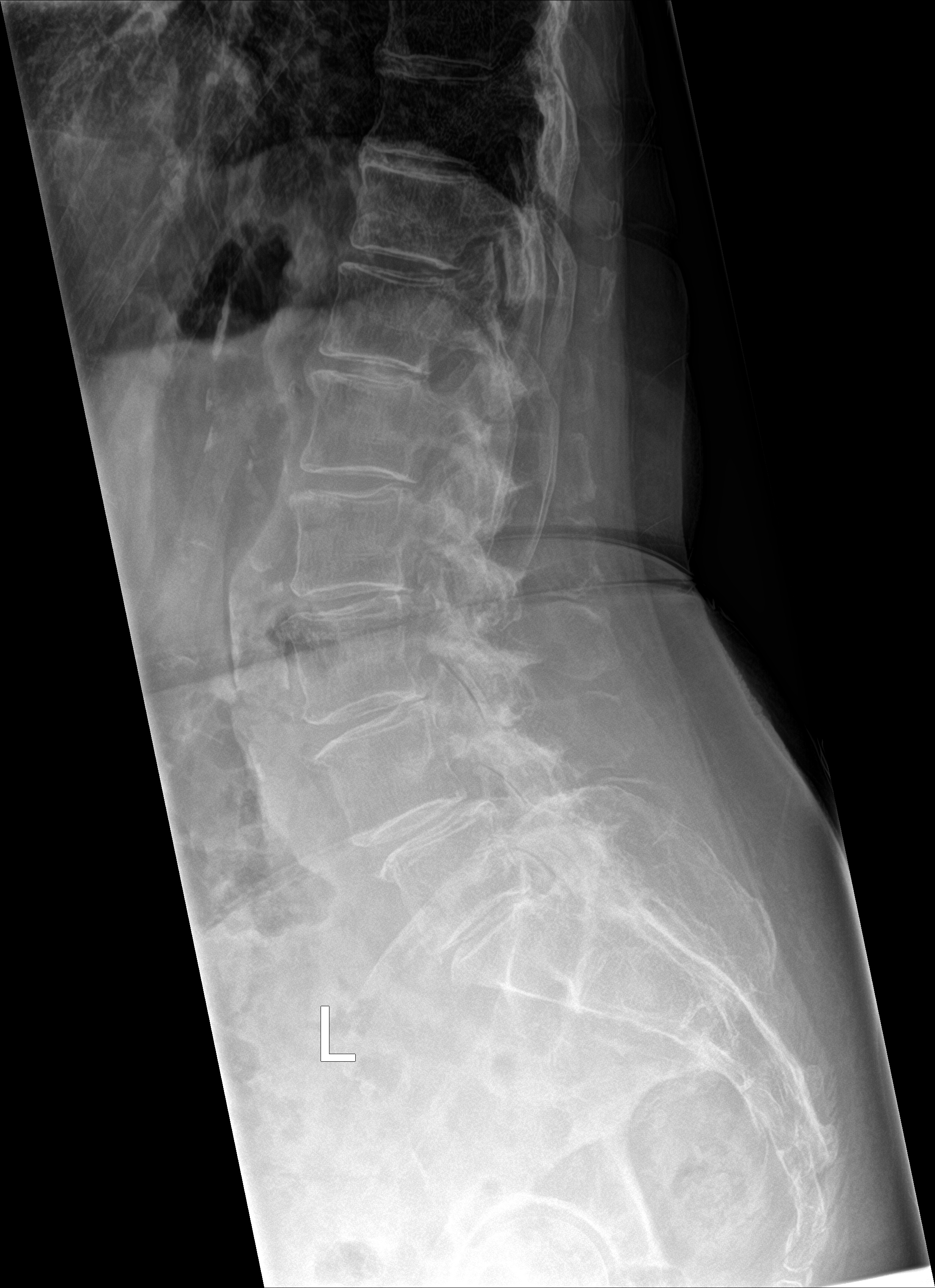

[l-spine spot]
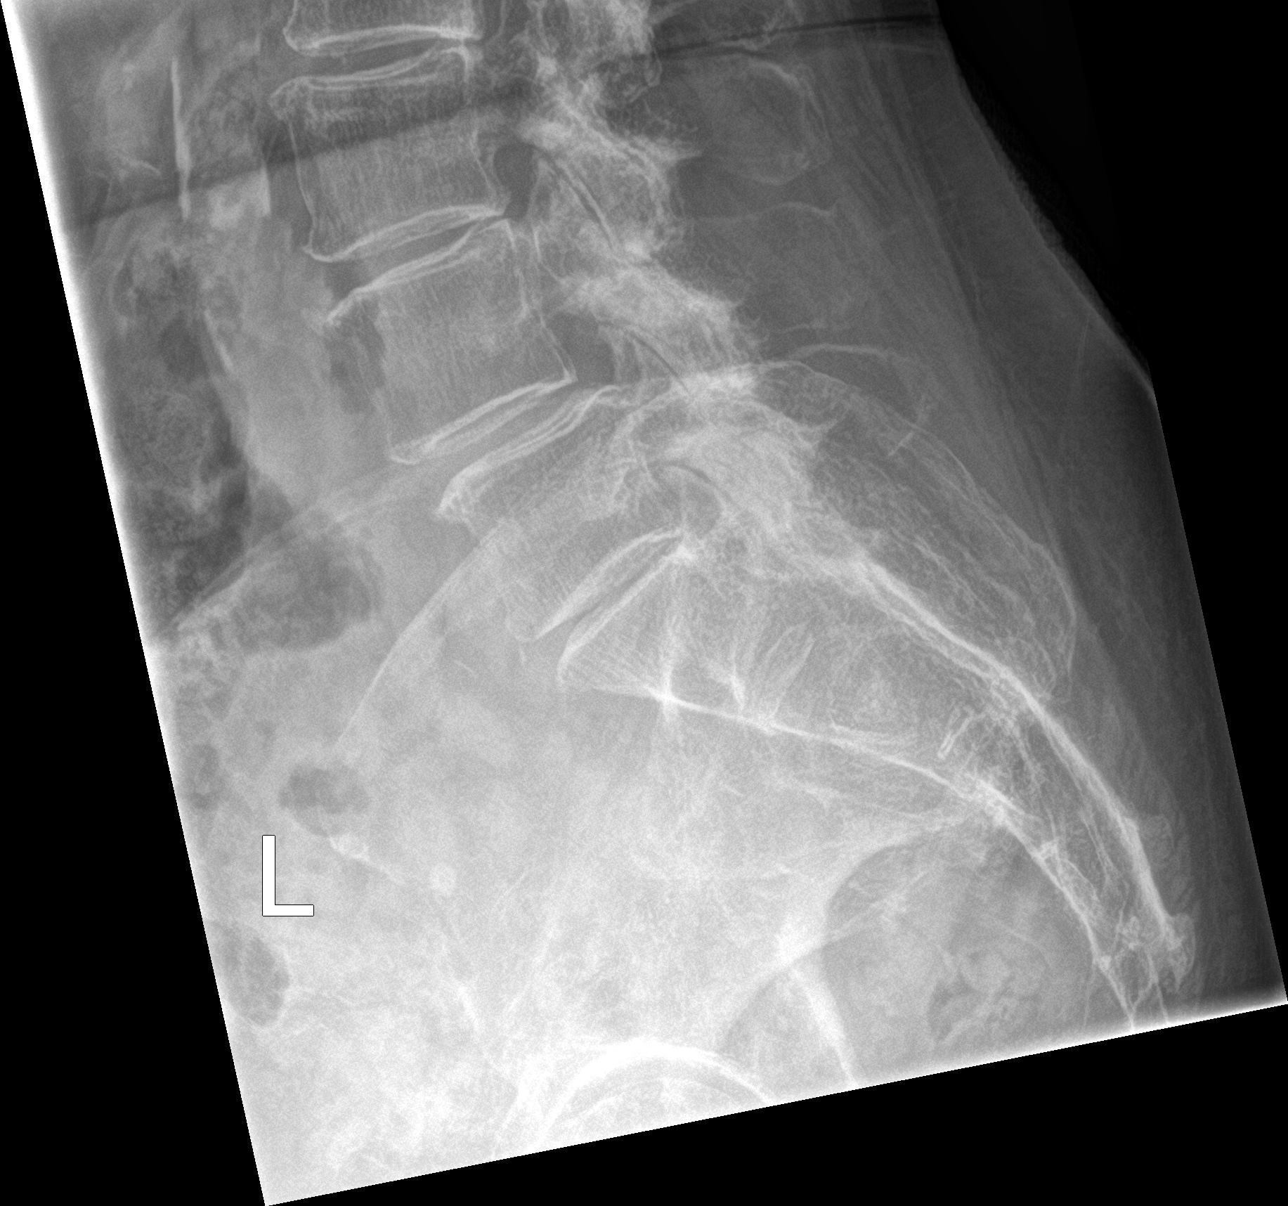

[5 of 5 positions shown; findings below may reference images not displayed]

FINDINGS: There are 5 nonrib bearing lumbar-type vertebral bodies.

The vertebral body heights are maintained. There generalized
osteopenia.

5 mm anterolisthesis of L4 on L5 secondary to facet disease. There
is no spondylolysis.

There is no acute fracture.

Degenerative disc disease with disc height loss at L2-3, L3-4, L4-5
L5-S1. Bilateral facet arthropathy throughout the lumbar spine most
severe at L4-5 and L5-S1.

The SI joints are unremarkable.

There is abdominal aortic atherosclerosis.
IMPRESSION: 1.  No acute osseous injury of the lumbar spine.
2. Lumbar spine spondylosis as described above.

## 2019-08-08 ENCOUNTER — Other Ambulatory Visit: Payer: Self-pay

## 2019-08-08 ENCOUNTER — Ambulatory Visit: Payer: Self-pay

## 2019-08-08 ENCOUNTER — Encounter: Payer: Self-pay | Admitting: Family Medicine

## 2019-08-08 ENCOUNTER — Ambulatory Visit (INDEPENDENT_AMBULATORY_CARE_PROVIDER_SITE_OTHER): Payer: Medicare Other | Admitting: Family Medicine

## 2019-08-08 ENCOUNTER — Ambulatory Visit: Payer: Medicare Other | Admitting: Nurse Practitioner

## 2019-08-08 VITALS — BP 102/62 | HR 71 | Ht 61.0 in

## 2019-08-08 DIAGNOSIS — M7061 Trochanteric bursitis, right hip: Secondary | ICD-10-CM

## 2019-08-08 DIAGNOSIS — I10 Essential (primary) hypertension: Secondary | ICD-10-CM | POA: Diagnosis not present

## 2019-08-08 DIAGNOSIS — M25552 Pain in left hip: Secondary | ICD-10-CM | POA: Diagnosis not present

## 2019-08-08 DIAGNOSIS — R609 Edema, unspecified: Secondary | ICD-10-CM | POA: Diagnosis not present

## 2019-08-08 DIAGNOSIS — L97512 Non-pressure chronic ulcer of other part of right foot with fat layer exposed: Secondary | ICD-10-CM | POA: Diagnosis not present

## 2019-08-08 DIAGNOSIS — M25551 Pain in right hip: Secondary | ICD-10-CM

## 2019-08-08 DIAGNOSIS — Z4800 Encounter for change or removal of nonsurgical wound dressing: Secondary | ICD-10-CM | POA: Diagnosis not present

## 2019-08-08 DIAGNOSIS — M7062 Trochanteric bursitis, left hip: Secondary | ICD-10-CM | POA: Diagnosis not present

## 2019-08-08 NOTE — Patient Instructions (Signed)
Good to see you Keep the foot above the heart See me again in 3 months

## 2019-08-08 NOTE — Progress Notes (Signed)
[name] [place], [place] [address] Phone: [phone] Subjective:   I Susan [name] am serving as a Education administrator for Dr. Hulan Saas.    CC: Bilateral hand pain, foot pain  [id number]   05/19/2019 Patient given repeat injections again today.  Patient is getting rebound to mom sonogram.  We will continue tender padding.  Patient is having atrophy of the surrounding musculature in the area and encourage patient that we need to consider the possibility of CT scan of the back with possible epidurals for more of a radicular symptoms.  Concern with adding to any medication secondary to patient being a large fall risk.  Patient is in agreement with the plan but wants to continue with these injections because of their low risk and high reward.  Patient will follow-up with me again in 2 months.  08/08/2019 Susan [name] is a 85 y.o. female coming in with complaint of bilateral hip and foot pain. Foot is causing pain in the hips. Foot is doing better.  Patient's foot had a ironing board and dropped on it.  Patient has been seeing wound care and making progress.  Denies fevers or chills only has swelling of the lower extremity.  Tender to palpation she states still to the hips and is still waking her up at night.  Affecting her walking.     Past Medical History:  Diagnosis Date  . Actinic keratosis, hx of    chest wall (2012)  . Allergic rhinitis   . Anxiety   . Arthritis    "spine, hips" (01/30/2017)  . Bursitis   . Cellulitis of right foot 01/30/2017  . Colon polyps   . Diverticulosis   . Esophageal dysmotilities 10/16/2017  . Fibromyalgia    "qwhere" (01/30/2017)  . Gallstones   . GERD (gastroesophageal reflux disease)   . High cholesterol   . History of kidney stones   . History of stomach ulcers   . Hx of squamous cell carcinoma excision    right mandible (2011), right arm (2014)  . Hypertension   . IBS (irritable bowel syndrome)   .  Osteoarthritis   . Pneumonia 2000s X 2   "twice" (01/30/2017)  . Pyloric stenosis   . Stroke [facility]) 10/2014   denies residual on 01/30/2017   Past Surgical History:  Procedure Laterality Date  . APPENDECTOMY    . CARDIOVASCULAR STRESS TEST  04/2016   nuclear cardiolite stress test done in [place] (no ischemia, no LVH, no LV systolic function)  . CATARACT EXTRACTION W/ INTRAOCULAR LENS  IMPLANT, BILATERAL Bilateral   . CHOLECYSTECTOMY OPEN    . COLONOSCOPY  2009, 2014  . DIAGNOSTIC MAMMOGRAM  2009, 2012  . TONSILLECTOMY     Social History   Socioeconomic History  . Marital status: Widowed    Spouse name: Not on file  . Number of children: 4  . Years of education: Not on file  . Highest education level: Not on file  Occupational History  . Occupation: retired  Scientific laboratory technician  . Financial resource strain: Not on file  . Food insecurity    Worry: Not on file    Inability: Not on file  . Transportation needs    Medical: Not on file    Non-medical: Not on file  Tobacco Use  . Smoking status: Never Smoker  . Smokeless tobacco: Never Used  Substance and Sexual Activity  . Alcohol use: Yes    Comment: glass of red  wine maybe once week  . Drug use: No  . Sexual activity: Not on file  Lifestyle  . Physical activity    Days per week: Not on file    Minutes per session: Not on file  . Stress: Not on file  Relationships  . Social Herbalist on phone: Not on file    Gets together: Not on file    Attends religious service: Not on file    Active member of club or organization: Not on file    Attends meetings of clubs or organizations: Not on file    Relationship status: Not on file  Other Topics Concern  . Not on file  Social History Narrative  . Not on file   Allergies  Allergen Reactions  . Butrans [Buprenorphine] Other (See Comments)    Severe chest pains  . Codeine Other (See Comments)    Severe Chest pains  . Morphine And Related Other (See Comments)     Severe chest pains   Family History  Problem Relation Age of Onset  . Stroke Mother   . Heart attack Mother   . Stroke Father   . Heart attack Father   . Heart attack Maternal Grandmother   . Stroke Maternal Grandmother   . Stroke Paternal Grandmother   . Heart attack Paternal Grandmother   . Heart attack Maternal Grandfather   . Stroke Maternal Grandfather   . Stroke Paternal Grandfather   . Heart attack Paternal Grandfather   . Colon cancer Neg Hx   . Pancreatic cancer Neg Hx   . Stomach cancer Neg Hx   . Esophageal cancer Neg Hx      Current Outpatient Medications (Cardiovascular):  .  atorvastatin (LIPITOR) 10 MG tablet, Take 1 tablet (10 mg total) by mouth daily. .  cholestyramine (QUESTRAN) 4 g packet, Take 1 packet (4 g total) by mouth daily. .  furosemide (LASIX) 20 MG tablet, Take 1 tablet by mouth once daily .  losartan (COZAAR) 100 MG tablet, Take 1 tablet (100 mg total) by mouth daily. .  metoprolol succinate (TOPROL-XL) 25 MG 24 hr tablet, Take 1 tablet (25 mg total) by mouth daily.  Current Outpatient Medications (Respiratory):  .  fexofenadine (ALLEGRA) 180 MG tablet, Take 180 mg by mouth daily.  Current Outpatient Medications (Analgesics):  .  acetaminophen (TYLENOL) 650 MG CR tablet, Take 650 mg by mouth every 8 (eight) hours as needed for pain. Marland Kitchen  aspirin EC 325 MG tablet, Take 325 mg by mouth daily.  Current Outpatient Medications (Hematological):  Marland Kitchen  IRON PO, Take 65 mg by mouth 3 (three) times a week.  Current Outpatient Medications (Other):  Marland Kitchen  ALPRAZolam (XANAX) 0.5 MG tablet, TAKE 1 TABLET BY MOUTH ONCE DAILY AT BEDTIME AS NEEDED FOR ANXIETY .  Ascorbic Acid (VITAMIN C) 250 MG CHEW, Chew 250 mg by mouth 3 (three) times a week. 2 tab 3 time a week .  carbamide peroxide (DEBROX) 6.5 % OTIC solution, Place 5 drops into both ears at bedtime. .  cephALEXin (KEFLEX) 500 MG capsule, Take 1 capsule (500 mg total) by mouth 3 (three) times daily. Marland Kitchen   dicyclomine (BENTYL) 10 MG capsule, Take 1 capsule (10 mg total) by mouth 2 (two) times daily. .  diphenoxylate-atropine (LOMOTIL) 2.5-0.025 MG tablet, Take 1 tablet by mouth 3 (three) times daily as needed for diarrhea or loose stools. .  famotidine (PEPCID) 20 MG tablet, TAKE 1 TABLET BY MOUTH TWICE DAILY .  loperamide (IMODIUM) 2 MG capsule, Take by mouth as needed for diarrhea or loose stools. .  Omega 3-6-9 Fatty Acids (OMEGA-3 FUSION) LIQD, Take by mouth. 2840 mg, take one teaspoon daily .  potassium chloride SA (K-DUR,KLOR-CON) 20 MEQ tablet, Take 1 tablet (20 mEq total) by mouth daily. .  Vitamin D, Ergocalciferol, (DRISDOL) 1.25 MG (50000 UT) CAPS capsule, Take 1 capsule by mouth once a week    Past medical history, social, surgical and family history all reviewed in electronic medical record.  No pertanent information unless stated regarding to the chief complaint.   Review of Systems:  No headache, visual changes, nausea, vomiting, diarrhea, constipation, dizziness, abdominal pain, skin rash, fevers, chills, night sweats, weight loss, swollen lymph nodes, body aches, joint swelling,  chest pain, shortness of breath, mood changes.  Positive muscle aches  Objective  Blood pressure 102/62, pulse 71, height 5\' 1"  (1.549 m), SpO2 94 %.    General: No apparent distress alert and oriented x3 mood and affect normal, dressed appropriately.  HEENT: Pupils equal, extraocular movements intact  Respiratory: Patient's speak in full sentences and does not appear short of breath  Cardiovascular: 1+ lower extremity edema, non tender, no erythema  Skin: Warm dry intact with no signs of infection or rash on extremities or on axial skeleton.  Abdomen: Soft nontender  Neuro: Cranial nerves II through XII are intact, neurovascularly intact in all extremities with 2+ DTRs and 2+ pulses.  Lymph: No lymphadenopathy of posterior or anterior cervical chain or axillae bilaterally.  Gait antalgic walking  with the aid of a walker MSK: Significant arthritic changes Hip pain bilaterally.  Seems to be over the greater trochanteric area.  Decreased range of motion in all planes.  Patient does have atrophy of the musculature over the lateral aspect of the hips bilaterally.  Palpable enlargement of the greater tuberosity bursa sac.  Right foot exam shows the patient does have 1+ pitting edema.  Patient slow is healing with good granulation tissue over the dorsal aspect of the foot.  No sign of any infectious etiology.   Procedure: Real-time Ultrasound Guided Injection of right greater trochanteric bursitis secondary to patient's body habitus Device: GE Logiq Q7 Ultrasound guided injection is preferred based studies that show increased duration, increased effect, greater accuracy, decreased procedural pain, increased response rate, and decreased cost with ultrasound guided versus blind injection.  Verbal informed consent obtained.  Time-out conducted.  Noted no overlying erythema, induration, or other signs of local infection.  Skin prepped in a sterile fashion.  Local anesthesia: Topical Ethyl chloride.  With sterile technique and under real time ultrasound guidance:  Greater trochanteric area was visualized and patient's bursa was noted. A 22-gauge 3 inch needle was inserted and 4 cc of 0.5% Marcaine and 1 cc of Kenalog 40 mg/dL was injected. Pictures taken Completed without difficulty  Pain immediately resolved suggesting accurate placement of the medication.  Advised to call if fevers/chills, erythema, induration, drainage, or persistent bleeding.  Images permanently stored and available for review in the ultrasound unit.  Impression: Technically successful ultrasound guided injection.   Procedure: Real-time Ultrasound Guided Injection of left  greater trochanteric bursitis secondary to patient's body habitus Device: GE Logiq Q7  Ultrasound guided injection is preferred based studies that show  increased duration, increased effect, greater accuracy, decreased procedural pain, increased response rate, and decreased cost with ultrasound guided versus blind injection.  Verbal informed consent obtained.  Time-out conducted.  Noted no overlying erythema, induration, or other  signs of local infection.  Skin prepped in a sterile fashion.  Local anesthesia: Topical Ethyl chloride.  With sterile technique and under real time ultrasound guidance:  Greater trochanteric area was visualized and patient's bursa was noted. A 22-gauge 3 inch needle was inserted and 4 cc of 0.5% Marcaine and 1 cc of Kenalog 40 mg/dL was injected. Pictures taken Completed without difficulty  Pain immediately resolved suggesting accurate placement of the medication.  Advised to call if fevers/chills, erythema, induration, drainage, or persistent bleeding.  Images permanently stored and available for review in the ultrasound unit.  Impression: Technically successful ultrasound guided injection.    Impression and Recommendations:     This case required medical decision making of moderate complexity. The above documentation has been reviewed and is accurate and complete Lyndal Pulley, DO       Note: This dictation was prepared with Dragon dictation along with smaller phrase technology. Any transcriptional errors that result from this process are unintentional.

## 2019-08-09 ENCOUNTER — Encounter: Payer: Self-pay | Admitting: Family Medicine

## 2019-08-09 NOTE — Assessment & Plan Note (Signed)
Repeat injections given.  Discussed icing regimen and home exercise, discussed which activities to do which wants to avoid.  Increase activity slowly over the course the next several weeks.  Can repeat injections every 8 weeks.  Due to patient's age and comorbidities and medications unfortunately not a lot of other modalities.

## 2019-08-13 DIAGNOSIS — I10 Essential (primary) hypertension: Secondary | ICD-10-CM | POA: Diagnosis not present

## 2019-08-13 DIAGNOSIS — L97512 Non-pressure chronic ulcer of other part of right foot with fat layer exposed: Secondary | ICD-10-CM | POA: Diagnosis not present

## 2019-08-13 DIAGNOSIS — R609 Edema, unspecified: Secondary | ICD-10-CM | POA: Diagnosis not present

## 2019-08-13 DIAGNOSIS — Z4800 Encounter for change or removal of nonsurgical wound dressing: Secondary | ICD-10-CM | POA: Diagnosis not present

## 2019-08-14 ENCOUNTER — Telehealth: Payer: Self-pay | Admitting: Nurse Practitioner

## 2019-08-14 NOTE — Telephone Encounter (Signed)

## 2019-08-15 ENCOUNTER — Other Ambulatory Visit: Payer: Self-pay

## 2019-08-15 ENCOUNTER — Ambulatory Visit (INDEPENDENT_AMBULATORY_CARE_PROVIDER_SITE_OTHER): Payer: Medicare Other | Admitting: Nurse Practitioner

## 2019-08-15 ENCOUNTER — Encounter: Payer: Self-pay | Admitting: Nurse Practitioner

## 2019-08-15 VITALS — BP 160/70 | HR 58 | Temp 97.8°F | Ht 61.0 in | Wt 109.4 lb

## 2019-08-15 DIAGNOSIS — K2100 Gastro-esophageal reflux disease with esophagitis, without bleeding: Secondary | ICD-10-CM

## 2019-08-15 DIAGNOSIS — M79671 Pain in right foot: Secondary | ICD-10-CM

## 2019-08-15 DIAGNOSIS — I872 Venous insufficiency (chronic) (peripheral): Secondary | ICD-10-CM

## 2019-08-15 DIAGNOSIS — I10 Essential (primary) hypertension: Secondary | ICD-10-CM

## 2019-08-15 DIAGNOSIS — R739 Hyperglycemia, unspecified: Secondary | ICD-10-CM | POA: Diagnosis not present

## 2019-08-15 DIAGNOSIS — E782 Mixed hyperlipidemia: Secondary | ICD-10-CM

## 2019-08-15 DIAGNOSIS — Z23 Encounter for immunization: Secondary | ICD-10-CM

## 2019-08-15 LAB — HEPATIC FUNCTION PANEL
ALT: 14 U/L (ref 0–35)
AST: 23 U/L (ref 0–37)
Albumin: 3.8 g/dL (ref 3.5–5.2)
Alkaline Phosphatase: 72 U/L (ref 39–117)
Bilirubin, Direct: 0.1 mg/dL (ref 0.0–0.3)
Total Bilirubin: 0.3 mg/dL (ref 0.2–1.2)
Total Protein: 6.5 g/dL (ref 6.0–8.3)

## 2019-08-15 LAB — BASIC METABOLIC PANEL
BUN: 20 mg/dL (ref 6–23)
CO2: 27 mEq/L (ref 19–32)
Calcium: 9.4 mg/dL (ref 8.4–10.5)
Chloride: 110 mEq/L (ref 96–112)
Creatinine, Ser: 0.63 mg/dL (ref 0.40–1.20)
GFR: 88.24 mL/min (ref >60.00)
Glucose, Bld: 99 mg/dL (ref 70–99)
Potassium: 4.9 mEq/L (ref 3.5–5.1)
Sodium: 145 mEq/L (ref 135–145)

## 2019-08-15 LAB — HEMOGLOBIN A1C: Hgb A1c MFr Bld: 5.7 % (ref 4.6–6.5)

## 2019-08-15 LAB — LIPID PANEL
Cholesterol: 159 mg/dL (ref 0–200)
HDL: 74.4 mg/dL (ref >39.00)
LDL Cholesterol: 56 mg/dL (ref 0–99)
NonHDL: 84.28
Total CHOL/HDL Ratio: 2
Triglycerides: 142 mg/dL (ref 0.0–149.0)
VLDL: 28.4 mg/dL (ref 0.0–40.0)

## 2019-08-15 LAB — TSH: TSH: 2.96 u[IU]/mL (ref 0.35–4.50)

## 2019-08-15 NOTE — Patient Instructions (Addendum)
Stable lab results  Ok to take naproxen 1tab every 12hrs with food. If no improvement in pain after 2doses, stop medication. Limit tylenol dose to no more that 2000mg  in 24hrs.  Start amlodipine at HS. F/up in 2weeks.

## 2019-08-15 NOTE — Progress Notes (Signed)
Subjective:  Patient ID: Susan [name], female    DOB: [birth year]  Age: 85 y.o. MRN: [id number]  CC: Follow-up (6 mo follow up--flu shot and lab consult?)  HPI  Right foot pain: Pain due to foot injury 7weeks ago with ironing board at home. Evaluated in ED, negative foot fracture. Describes pain at throbbing and tingling sensation. Rates pain at 3/10 today. Pain is worst with walking and at bedtime. Has slowing foot laceration. Under the care of wound clinic at this time. Reports persistent foot pain despite use of tylenol 650mg  TID. No improvement with tramadol. Was given naproxen Rx, but did not take due to fear of possible side effects.  HTN: Elevated today. Normal BP reading during OV with sports medicine 08/08/2019. Reports she is compliant with medications. Does not check BP at home. No headache, no dizziness. Has chronic LE edema due to venous insufficiency (use of furosemide) BP Readings from Last 3 Encounters:  08/15/19 (!) 160/70  08/08/19 102/62  06/26/19 (!) 196/77   Reviewed past Medical, Social and Family history today.  Outpatient Medications Prior to Visit  Medication Sig Dispense Refill  . acetaminophen (TYLENOL) 650 MG CR tablet Take 650 mg by mouth every 8 (eight) hours as needed for pain.    Marland Kitchen ALPRAZolam (XANAX) 0.5 MG tablet TAKE 1 TABLET BY MOUTH ONCE DAILY AT BEDTIME AS NEEDED FOR ANXIETY 30 tablet 1  . Ascorbic Acid (VITAMIN C) 250 MG CHEW Chew 250 mg by mouth 3 (three) times a week. 2 tab 3 time a week    . aspirin EC 325 MG tablet Take 325 mg by mouth daily.    . carbamide peroxide (DEBROX) 6.5 % OTIC solution Place 5 drops into both ears at bedtime. 15 mL 0  . cholestyramine (QUESTRAN) 4 g packet Take 1 packet (4 g total) by mouth daily. 90 each 3  . dicyclomine (BENTYL) 10 MG capsule Take 1 capsule (10 mg total) by mouth 2 (two) times daily. 180 capsule 1  . diphenoxylate-atropine (LOMOTIL) 2.5-0.025 MG tablet Take 1 tablet by mouth 3 (three) times daily  as needed for diarrhea or loose stools. 90 tablet 1  . famotidine (PEPCID) 20 MG tablet TAKE 1 TABLET BY MOUTH TWICE DAILY 180 tablet 1  . fexofenadine (ALLEGRA) 180 MG tablet Take 180 mg by mouth daily.    . IRON PO Take 65 mg by mouth 3 (three) times a week.    . loperamide (IMODIUM) 2 MG capsule Take by mouth as needed for diarrhea or loose stools.    . Vitamin D, Ergocalciferol, (DRISDOL) 1.25 MG (50000 UT) CAPS capsule Take 1 capsule by mouth once a week 12 capsule 0  . atorvastatin (LIPITOR) 10 MG tablet Take 1 tablet (10 mg total) by mouth daily. 90 tablet 1  . furosemide (LASIX) 20 MG tablet Take 1 tablet by mouth once daily 90 tablet 1  . losartan (COZAAR) 100 MG tablet Take 1 tablet (100 mg total) by mouth daily. 90 tablet 1  . metoprolol succinate (TOPROL-XL) 25 MG 24 hr tablet Take 1 tablet (25 mg total) by mouth daily. 90 tablet 1  . potassium chloride SA (K-DUR,KLOR-CON) 20 MEQ tablet Take 1 tablet (20 mEq total) by mouth daily. 90 tablet 0  . amoxicillin-clavulanate (AUGMENTIN) 875-125 MG tablet Take 1 tablet by mouth 2 (two) times daily. for 10 days    . naproxen (NAPROSYN) 500 MG tablet Take 500 mg by mouth 2 (two) times daily.    Ernestine Conrad  3-6-9 Fatty Acids (OMEGA-3 FUSION) LIQD Take by mouth. 2840 mg, take one teaspoon daily    . cephALEXin (KEFLEX) 500 MG capsule Take 1 capsule (500 mg total) by mouth 3 (three) times daily. (Patient not taking: Reported on 08/15/2019) 21 capsule 0  . traMADol (ULTRAM) 50 MG tablet Take 50 mg by mouth every 6 (six) hours as needed. for pain     No facility-administered medications prior to visit.     ROS See HPI  Objective:  BP (!) 160/70   Pulse (!) 58   Temp 97.8 F (36.6 C) (Tympanic)   Ht 5\' 1"  (1.549 m)   Wt 109 lb 6.4 oz (49.6 kg)   SpO2 97%   BMI 20.67 kg/m   BP Readings from Last 3 Encounters:  08/15/19 (!) 160/70  08/08/19 102/62  06/26/19 (!) 196/77    Wt Readings from Last 3 Encounters:  08/15/19 109 lb 6.4 oz (49.6  kg)  06/26/19 109 lb (49.4 kg)  05/06/19 111 lb 3.2 oz (50.4 kg)    Physical Exam Vitals signs reviewed.  Cardiovascular:     Rate and Rhythm: Normal rate and regular rhythm.     Pulses: Normal pulses.     Heart sounds: Normal heart sounds.  Pulmonary:     Effort: Pulmonary effort is normal.     Breath sounds: Normal breath sounds.  Musculoskeletal:        General: Swelling and tenderness present.     Right lower leg: Edema present.     Left lower leg: Edema present.     Right foot: Tenderness, swelling and laceration present.     Comments: Dry and intact guaze dressing and use of surgical shoe on right foot. Right toes are warm and able to move, normal sensation (cotton and microfilament).  Skin:    Findings: No erythema.  Neurological:     Mental Status: She is alert and oriented to person, place, and time.    Lab Results  Component Value Date   WBC 9.0 12/24/2018   HGB 13.3 12/24/2018   HCT 40.0 12/24/2018   PLT 328.0 12/24/2018   GLUCOSE 99 08/15/2019   CHOL 159 08/15/2019   TRIG 142.0 08/15/2019   HDL 74.40 08/15/2019   LDLDIRECT 102.0 05/01/2018   LDLCALC 56 08/15/2019   ALT 14 08/15/2019   AST 23 08/15/2019   NA 145 08/15/2019   K 4.9 08/15/2019   CL 110 08/15/2019   CREATININE 0.63 08/15/2019   BUN 20 08/15/2019   CO2 27 08/15/2019   TSH 2.96 08/15/2019   HGBA1C 5.7 08/15/2019    Dg Foot Complete Right  Result Date: 06/26/2019 CLINICAL DATA:  Dropped high-riding board on foot with laceration and pain, initial encounter EXAM: RIGHT FOOT COMPLETE - 3+ VIEW COMPARISON:  None. FINDINGS: No acute fracture or dislocation is noted. Mild soft tissue swelling is noted along the metatarsal heads. Calcaneal spurring is seen. IMPRESSION: Mild soft tissue swelling without acute bony abnormality. Electronically Signed   By: Inez Catalina M.D.   On: 06/26/2019 17:25    Assessment & Plan:   Angelisse was seen today for follow-up.  Diagnoses and all orders for this  visit:  Essential hypertension -     Basic metabolic panel -     TSH -     furosemide (LASIX) 20 MG tablet; Take 1 tablet (20 mg total) by mouth daily. -     losartan (COZAAR) 100 MG tablet; Take 1 tablet (100 mg total) by  mouth daily. -     metoprolol succinate (TOPROL-XL) 25 MG 24 hr tablet; Take 1 tablet (25 mg total) by mouth daily.  Right foot pain  GERD with esophagitis  Mixed hyperlipidemia -     Hepatic function panel -     Lipid panel -     atorvastatin (LIPITOR) 10 MG tablet; Take 1 tablet (10 mg total) by mouth daily.  Hyperglycemia -     Hemoglobin A1c  Need for immunization against influenza -     Flu Vaccine QUAD High Dose(Fluad)  Venous insufficiency of both lower extremities -     furosemide (LASIX) 20 MG tablet; Take 1 tablet (20 mg total) by mouth daily. -     potassium chloride SA (K-DUR) 20 MEQ tablet; Take 1 tablet (20 mEq total) by mouth daily.   I have discontinued Jillann Rennie's cephALEXin and traMADol. I have also changed her furosemide and potassium chloride SA. Additionally, I am having her maintain her fexofenadine, aspirin EC, acetaminophen, IRON PO, Vitamin C, loperamide, famotidine, diphenoxylate-atropine, Omega-3 Fusion, cholestyramine, Vitamin D (Ergocalciferol), dicyclomine, carbamide peroxide, ALPRAZolam, amoxicillin-clavulanate, naproxen, atorvastatin, losartan, and metoprolol succinate.  Meds ordered this encounter  Medications  . atorvastatin (LIPITOR) 10 MG tablet    Sig: Take 1 tablet (10 mg total) by mouth daily.    Dispense:  90 tablet    Refill:  1    Order Specific Question:   Supervising Provider    Answer:   MATTHEWS, CODY [[id number]]  . furosemide (LASIX) 20 MG tablet    Sig: Take 1 tablet (20 mg total) by mouth daily.    Dispense:  90 tablet    Refill:  1    Order Specific Question:   Supervising Provider    Answer:   MATTHEWS, CODY [[id number]]  . losartan (COZAAR) 100 MG tablet    Sig: Take 1 tablet (100 mg total) by mouth daily.     Dispense:  90 tablet    Refill:  1    Order Specific Question:   Supervising Provider    Answer:   MATTHEWS, CODY [[id number]]  . metoprolol succinate (TOPROL-XL) 25 MG 24 hr tablet    Sig: Take 1 tablet (25 mg total) by mouth daily.    Dispense:  90 tablet    Refill:  3    Order Specific Question:   Supervising Provider    Answer:   MATTHEWS, CODY [[id number]]  . potassium chloride SA (K-DUR) 20 MEQ tablet    Sig: Take 1 tablet (20 mEq total) by mouth daily.    Dispense:  90 tablet    Refill:  1    Order Specific Question:   Supervising Provider    Answer:   MATTHEWS, CODY [[id number]]    Problem List Items Addressed This Visit      Cardiovascular and Mediastinum   Essential hypertension - Primary    Elevated BP reading at home and in office. Start amlodipine 5mg  at HS. F/up in 2weeks  BP Readings from Last 3 Encounters:  08/15/19 (!) 160/70  08/08/19 102/62  06/26/19 (!) 196/77        Relevant Medications   atorvastatin (LIPITOR) 10 MG tablet   furosemide (LASIX) 20 MG tablet   losartan (COZAAR) 100 MG tablet   metoprolol succinate (TOPROL-XL) 25 MG 24 hr tablet   Other Relevant Orders   Basic metabolic panel (Completed)   TSH (Completed)   Venous insufficiency of both lower extremities   Relevant Medications   atorvastatin (LIPITOR)  10 MG tablet   furosemide (LASIX) 20 MG tablet   losartan (COZAAR) 100 MG tablet   metoprolol succinate (TOPROL-XL) 25 MG 24 hr tablet   potassium chloride SA (K-DUR) 20 MEQ tablet     Digestive   GERD with esophagitis     Other   Hyperglycemia   Relevant Orders   Hemoglobin A1c (Completed)   Hyperlipidemia   Relevant Medications   atorvastatin (LIPITOR) 10 MG tablet   furosemide (LASIX) 20 MG tablet   losartan (COZAAR) 100 MG tablet   metoprolol succinate (TOPROL-XL) 25 MG 24 hr tablet   Other Relevant Orders   Hepatic function panel (Completed)   Lipid panel (Completed)    Other Visit Diagnoses    Right foot pain       Need for  immunization against influenza       Relevant Orders   Flu Vaccine QUAD High Dose(Fluad) (Completed)      Follow-up: Return in about 2 weeks (around 08/29/2019) for HTN.  Wilfred Lacy, NP

## 2019-08-16 MED ORDER — METOPROLOL SUCCINATE ER 25 MG PO TB24: 25.0000 mg | ORAL_TABLET | Freq: Every day | ORAL | 3 refills | Status: DC

## 2019-08-16 MED ORDER — FUROSEMIDE 20 MG PO TABS: 20.0000 mg | ORAL_TABLET | Freq: Every day | ORAL | 1 refills | Status: DC

## 2019-08-16 MED ORDER — LOSARTAN POTASSIUM 100 MG PO TABS: 100.0000 mg | ORAL_TABLET | Freq: Every day | ORAL | 1 refills | Status: DC

## 2019-08-16 MED ORDER — ATORVASTATIN CALCIUM 10 MG PO TABS: 10.0000 mg | ORAL_TABLET | Freq: Every day | ORAL | 1 refills | Status: DC

## 2019-08-16 MED ORDER — POTASSIUM CHLORIDE CRYS ER 20 MEQ PO TBCR: 20.0000 meq | EXTENDED_RELEASE_TABLET | Freq: Every day | ORAL | 1 refills | Status: DC

## 2019-08-18 ENCOUNTER — Encounter: Payer: Self-pay | Admitting: Nurse Practitioner

## 2019-08-18 ENCOUNTER — Telehealth: Payer: Self-pay | Admitting: Nurse Practitioner

## 2019-08-18 DIAGNOSIS — S91311A Laceration without foreign body, right foot, initial encounter: Secondary | ICD-10-CM | POA: Diagnosis not present

## 2019-08-18 DIAGNOSIS — I10 Essential (primary) hypertension: Secondary | ICD-10-CM

## 2019-08-18 DIAGNOSIS — I872 Venous insufficiency (chronic) (peripheral): Secondary | ICD-10-CM | POA: Diagnosis not present

## 2019-08-18 DIAGNOSIS — L97512 Non-pressure chronic ulcer of other part of right foot with fat layer exposed: Secondary | ICD-10-CM | POA: Diagnosis not present

## 2019-08-18 MED ORDER — AMLODIPINE BESYLATE 5 MG PO TABS: 5.0000 mg | ORAL_TABLET | Freq: Every day | ORAL | 2 refills | Status: DC

## 2019-08-18 NOTE — Telephone Encounter (Signed)
-----   Message from Shawnie Pons, LPN sent at [id number]  9:19 AM EDT ----- Pt verbalize understand of test result.   Pt report BP on 08/16/2019--160/97, 08/17/2019 147/39 (pt stated 39--ask twice)? And 08/18/2019-- 181/95.

## 2019-08-18 NOTE — Telephone Encounter (Signed)
Pt is aware, she will call back once she know what day she will have the ride.

## 2019-08-18 NOTE — Telephone Encounter (Signed)
Persistent elevation in BP. Start amlodipine 5mg  at HS. Schedule 2weeks f/up appt ( F2F)

## 2019-08-18 NOTE — Assessment & Plan Note (Signed)
Elevated BP reading at home and in office. Start amlodipine 5mg  at HS. F/up in 2weeks  BP Readings from Last 3 Encounters:  08/15/19 (!) 160/70  08/08/19 102/62  06/26/19 (!) 196/77

## 2019-08-20 DIAGNOSIS — I10 Essential (primary) hypertension: Secondary | ICD-10-CM | POA: Diagnosis not present

## 2019-08-20 DIAGNOSIS — Z4800 Encounter for change or removal of nonsurgical wound dressing: Secondary | ICD-10-CM | POA: Diagnosis not present

## 2019-08-20 DIAGNOSIS — L97512 Non-pressure chronic ulcer of other part of right foot with fat layer exposed: Secondary | ICD-10-CM | POA: Diagnosis not present

## 2019-08-20 DIAGNOSIS — R609 Edema, unspecified: Secondary | ICD-10-CM | POA: Diagnosis not present

## 2019-08-22 DIAGNOSIS — L97512 Non-pressure chronic ulcer of other part of right foot with fat layer exposed: Secondary | ICD-10-CM | POA: Diagnosis not present

## 2019-08-22 DIAGNOSIS — Z4800 Encounter for change or removal of nonsurgical wound dressing: Secondary | ICD-10-CM | POA: Diagnosis not present

## 2019-08-22 DIAGNOSIS — I10 Essential (primary) hypertension: Secondary | ICD-10-CM | POA: Diagnosis not present

## 2019-08-22 DIAGNOSIS — R609 Edema, unspecified: Secondary | ICD-10-CM | POA: Diagnosis not present

## 2019-08-25 DIAGNOSIS — I10 Essential (primary) hypertension: Secondary | ICD-10-CM | POA: Diagnosis not present

## 2019-08-25 DIAGNOSIS — I872 Venous insufficiency (chronic) (peripheral): Secondary | ICD-10-CM | POA: Diagnosis not present

## 2019-08-25 DIAGNOSIS — L97512 Non-pressure chronic ulcer of other part of right foot with fat layer exposed: Secondary | ICD-10-CM | POA: Diagnosis not present

## 2019-08-27 DIAGNOSIS — R609 Edema, unspecified: Secondary | ICD-10-CM | POA: Diagnosis not present

## 2019-08-27 DIAGNOSIS — L97512 Non-pressure chronic ulcer of other part of right foot with fat layer exposed: Secondary | ICD-10-CM | POA: Diagnosis not present

## 2019-08-27 DIAGNOSIS — Z4800 Encounter for change or removal of nonsurgical wound dressing: Secondary | ICD-10-CM | POA: Diagnosis not present

## 2019-08-27 DIAGNOSIS — I10 Essential (primary) hypertension: Secondary | ICD-10-CM | POA: Diagnosis not present

## 2019-08-29 DIAGNOSIS — L97512 Non-pressure chronic ulcer of other part of right foot with fat layer exposed: Secondary | ICD-10-CM | POA: Diagnosis not present

## 2019-08-29 DIAGNOSIS — R609 Edema, unspecified: Secondary | ICD-10-CM | POA: Diagnosis not present

## 2019-08-29 DIAGNOSIS — I10 Essential (primary) hypertension: Secondary | ICD-10-CM | POA: Diagnosis not present

## 2019-08-29 DIAGNOSIS — Z4800 Encounter for change or removal of nonsurgical wound dressing: Secondary | ICD-10-CM | POA: Diagnosis not present

## 2019-09-01 ENCOUNTER — Telehealth: Payer: Self-pay | Admitting: Nurse Practitioner

## 2019-09-01 DIAGNOSIS — I872 Venous insufficiency (chronic) (peripheral): Secondary | ICD-10-CM | POA: Diagnosis not present

## 2019-09-01 DIAGNOSIS — Z4800 Encounter for change or removal of nonsurgical wound dressing: Secondary | ICD-10-CM | POA: Diagnosis not present

## 2019-09-01 DIAGNOSIS — L97512 Non-pressure chronic ulcer of other part of right foot with fat layer exposed: Secondary | ICD-10-CM | POA: Diagnosis not present

## 2019-09-01 DIAGNOSIS — I10 Essential (primary) hypertension: Secondary | ICD-10-CM | POA: Diagnosis not present

## 2019-09-01 DIAGNOSIS — R609 Edema, unspecified: Secondary | ICD-10-CM | POA: Diagnosis not present

## 2019-09-01 NOTE — Telephone Encounter (Signed)

## 2019-09-02 ENCOUNTER — Ambulatory Visit: Payer: Medicare Other | Admitting: Nurse Practitioner

## 2019-09-02 ENCOUNTER — Encounter: Payer: Self-pay | Admitting: Nurse Practitioner

## 2019-09-02 ENCOUNTER — Other Ambulatory Visit: Payer: Self-pay | Admitting: Nurse Practitioner

## 2019-09-02 ENCOUNTER — Other Ambulatory Visit: Payer: Self-pay | Admitting: Family Medicine

## 2019-09-02 ENCOUNTER — Ambulatory Visit (INDEPENDENT_AMBULATORY_CARE_PROVIDER_SITE_OTHER): Payer: Medicare Other | Admitting: Nurse Practitioner

## 2019-09-02 ENCOUNTER — Other Ambulatory Visit: Payer: Self-pay

## 2019-09-02 VITALS — BP 122/70 | HR 67 | Temp 97.8°F | Ht 61.0 in | Wt 108.6 lb

## 2019-09-02 DIAGNOSIS — I10 Essential (primary) hypertension: Secondary | ICD-10-CM | POA: Diagnosis not present

## 2019-09-02 DIAGNOSIS — F418 Other specified anxiety disorders: Secondary | ICD-10-CM

## 2019-09-02 DIAGNOSIS — K2100 Gastro-esophageal reflux disease with esophagitis, without bleeding: Secondary | ICD-10-CM

## 2019-09-02 MED ORDER — AMLODIPINE BESYLATE 5 MG PO TABS: 5.0000 mg | ORAL_TABLET | Freq: Every day | ORAL | 3 refills | Status: DC

## 2019-09-02 MED ORDER — FAMOTIDINE 20 MG PO TABS: 20.0000 mg | ORAL_TABLET | Freq: Every day | ORAL | 1 refills | Status: DC

## 2019-09-02 NOTE — Patient Instructions (Addendum)
Hold atorvastatin for next 49months. Will repeat lipid panel (fasting) in 49months.  Try to decrease famotidine dose to 20mg  once daily, then as needed. If GERD symptoms return, go back to 20mg  BID.  Continue other medications as prescribed. Check BP once a day in AM only

## 2019-09-02 NOTE — Progress Notes (Signed)
Subjective:  Patient ID: Susan [name], female    DOB: [birth year]  Age: 85 y.o. MRN: [id number]  CC: Follow-up (follow up on BP--has record from home--lipid result consult)  HPI Ms. Stranger is here for BP re eval. She reports Home BP: 140s/80s-130s/70s. She denies any adverse side effects from any of her medications. Reports Improved LE edema. R>L due to recent blunt force trauma. She is under care of wound clinic at this time.  She will like to discontinue storvastatin for 38months since last LDL was at goal. She think this was due to use of [place] daily. She does recognize that that atorvastatin was prescribed due to her hx of CVA. She has no Hx of DM, last hgbA1c of 5.7  BP Readings from Last 3 Encounters:  09/02/19 122/70  08/15/19 (!) 160/70  08/08/19 102/62   Lipid Panel     Component Value Date/Time   CHOL 159 08/15/2019 1129   TRIG 142.0 08/15/2019 1129   HDL 74.40 08/15/2019 1129   CHOLHDL 2 08/15/2019 1129   VLDL 28.4 08/15/2019 1129   LDLCALC 56 08/15/2019 1129   LDLDIRECT 102.0 05/01/2018 1442   Reviewed past Medical, Social and Family history today.  Outpatient Medications Prior to Visit  Medication Sig Dispense Refill  . acetaminophen (TYLENOL) 650 MG CR tablet Take 650 mg by mouth every 8 (eight) hours as needed for pain.    Marland Kitchen aspirin EC 325 MG tablet Take 325 mg by mouth daily.    Marland Kitchen atorvastatin (LIPITOR) 10 MG tablet Take 1 tablet (10 mg total) by mouth daily. 90 tablet 1  . cholestyramine (QUESTRAN) 4 g packet Take 1 packet (4 g total) by mouth daily. 90 each 3  . dicyclomine (BENTYL) 10 MG capsule Take 1 capsule (10 mg total) by mouth 2 (two) times daily. 180 capsule 1  . fexofenadine (ALLEGRA) 180 MG tablet Take 180 mg by mouth daily.    . furosemide (LASIX) 20 MG tablet Take 1 tablet (20 mg total) by mouth daily. 90 tablet 1  . IRON PO Take 65 mg by mouth 3 (three) times a week.    . loperamide (IMODIUM) 2 MG capsule Take by mouth as needed for diarrhea  or loose stools.    Marland Kitchen losartan (COZAAR) 100 MG tablet Take 1 tablet (100 mg total) by mouth daily. 90 tablet 1  . metoprolol succinate (TOPROL-XL) 25 MG 24 hr tablet Take 1 tablet (25 mg total) by mouth daily. 90 tablet 3  . naproxen (NAPROSYN) 500 MG tablet Take 500 mg by mouth 2 (two) times daily.    . potassium chloride SA (K-DUR) 20 MEQ tablet Take 1 tablet (20 mEq total) by mouth daily. 90 tablet 1  . ALPRAZolam (XANAX) 0.5 MG tablet TAKE 1 TABLET BY MOUTH ONCE DAILY AT BEDTIME AS NEEDED FOR ANXIETY 30 tablet 1  . amLODipine (NORVASC) 5 MG tablet Take 1 tablet (5 mg total) by mouth at bedtime. 30 tablet 2  . carbamide peroxide (DEBROX) 6.5 % OTIC solution Place 5 drops into both ears at bedtime. 15 mL 0  . famotidine (PEPCID) 20 MG tablet TAKE 1 TABLET BY MOUTH TWICE DAILY 180 tablet 1  . Vitamin D, Ergocalciferol, (DRISDOL) 1.25 MG (50000 UT) CAPS capsule Take 1 capsule by mouth once a week 12 capsule 0  . amoxicillin-clavulanate (AUGMENTIN) 875-125 MG tablet Take 1 tablet by mouth 2 (two) times daily. for 10 days    . Ascorbic Acid (VITAMIN C) 250 MG CHEW Chew  250 mg by mouth 3 (three) times a week. 2 tab 3 time a week    . diphenoxylate-atropine (LOMOTIL) 2.5-0.025 MG tablet Take 1 tablet by mouth 3 (three) times daily as needed for diarrhea or loose stools. (Patient not taking: Reported on 09/02/2019) 90 tablet 1  . Omega 3-6-9 Fatty Acids (OMEGA-3 FUSION) LIQD Take by mouth. 2840 mg, take one teaspoon daily     No facility-administered medications prior to visit.     ROS See HPI  Objective:  BP 122/70   Pulse 67   Temp 97.8 F (36.6 C) (Tympanic)   Ht 5\' 1"  (1.549 m)   Wt 108 lb 9.6 oz (49.3 kg)   SpO2 98%   BMI 20.52 kg/m   BP Readings from Last 3 Encounters:  09/02/19 122/70  08/15/19 (!) 160/70  08/08/19 102/62    Wt Readings from Last 3 Encounters:  09/02/19 108 lb 9.6 oz (49.3 kg)  08/15/19 109 lb 6.4 oz (49.6 kg)  06/26/19 109 lb (49.4 kg)    Physical Exam  Cardiovascular:     Rate and Rhythm: Normal rate and regular rhythm.     Pulses: Normal pulses.     Heart sounds: Normal heart sounds.  Pulmonary:     Effort: Pulmonary effort is normal.     Breath sounds: Normal breath sounds.  Musculoskeletal:     Right lower leg: Edema present.     Left lower leg: Edema present.  Neurological:     Mental Status: She is alert and oriented to person, place, and time.     Lab Results  Component Value Date   WBC 9.0 12/24/2018   HGB 13.3 12/24/2018   HCT 40.0 12/24/2018   PLT 328.0 12/24/2018   GLUCOSE 99 08/15/2019   CHOL 159 08/15/2019   TRIG 142.0 08/15/2019   HDL 74.40 08/15/2019   LDLDIRECT 102.0 05/01/2018   LDLCALC 56 08/15/2019   ALT 14 08/15/2019   AST 23 08/15/2019   NA 145 08/15/2019   K 4.9 08/15/2019   CL 110 08/15/2019   CREATININE 0.63 08/15/2019   BUN 20 08/15/2019   CO2 27 08/15/2019   TSH 2.96 08/15/2019   HGBA1C 5.7 08/15/2019    Dg Foot Complete Right  Result Date: 06/26/2019 CLINICAL DATA:  Dropped high-riding board on foot with laceration and pain, initial encounter EXAM: RIGHT FOOT COMPLETE - 3+ VIEW COMPARISON:  None. FINDINGS: No acute fracture or dislocation is noted. Mild soft tissue swelling is noted along the metatarsal heads. Calcaneal spurring is seen. IMPRESSION: Mild soft tissue swelling without acute bony abnormality. Electronically Signed   By: Inez Catalina M.D.   On: 06/26/2019 17:25    Assessment & Plan:   Raeshell was seen today for follow-up.  Diagnoses and all orders for this visit:  Essential hypertension -     amLODipine (NORVASC) 5 MG tablet; Take 1 tablet (5 mg total) by mouth at bedtime.  GERD with esophagitis -     famotidine (PEPCID) 20 MG tablet; Take 1 tablet (20 mg total) by mouth daily.   I have discontinued Lateefah Mavis's Vitamin C, diphenoxylate-atropine, Omega-3 Fusion, carbamide peroxide, and amoxicillin-clavulanate. I have also changed her famotidine. Additionally, I am  having her maintain her fexofenadine, aspirin EC, acetaminophen, IRON PO, loperamide, cholestyramine, dicyclomine, naproxen, atorvastatin, furosemide, losartan, metoprolol succinate, potassium chloride SA, and amLODipine.  Meds ordered this encounter  Medications  . famotidine (PEPCID) 20 MG tablet    Sig: Take 1 tablet (20 mg total)  by mouth daily.    Dispense:  90 tablet    Refill:  1    Order Specific Question:   Supervising Provider    Answer:   Lucille Passy [[id number]]  . amLODipine (NORVASC) 5 MG tablet    Sig: Take 1 tablet (5 mg total) by mouth at bedtime.    Dispense:  90 tablet    Refill:  3    Order Specific Question:   Supervising Provider    Answer:   Lucille Passy [[id number]]   Problem List Items Addressed This Visit      Cardiovascular and Mediastinum   Essential hypertension - Primary   Relevant Medications   amLODipine (NORVASC) 5 MG tablet     Digestive   GERD with esophagitis   Relevant Medications   famotidine (PEPCID) 20 MG tablet      Follow-up: Return in about 3 months (around 12/02/2019) for HTN and , hyperlipidemia (fatsing).  Wilfred Lacy, NP

## 2019-09-03 DIAGNOSIS — L97512 Non-pressure chronic ulcer of other part of right foot with fat layer exposed: Secondary | ICD-10-CM | POA: Diagnosis not present

## 2019-09-03 DIAGNOSIS — R609 Edema, unspecified: Secondary | ICD-10-CM | POA: Diagnosis not present

## 2019-09-03 DIAGNOSIS — I10 Essential (primary) hypertension: Secondary | ICD-10-CM | POA: Diagnosis not present

## 2019-09-03 DIAGNOSIS — Z4800 Encounter for change or removal of nonsurgical wound dressing: Secondary | ICD-10-CM | POA: Diagnosis not present

## 2019-09-03 DIAGNOSIS — I872 Venous insufficiency (chronic) (peripheral): Secondary | ICD-10-CM | POA: Diagnosis not present

## 2019-09-04 ENCOUNTER — Encounter: Payer: Self-pay | Admitting: Nurse Practitioner

## 2019-09-05 DIAGNOSIS — L97512 Non-pressure chronic ulcer of other part of right foot with fat layer exposed: Secondary | ICD-10-CM | POA: Diagnosis not present

## 2019-09-05 DIAGNOSIS — I10 Essential (primary) hypertension: Secondary | ICD-10-CM | POA: Diagnosis not present

## 2019-09-05 DIAGNOSIS — I872 Venous insufficiency (chronic) (peripheral): Secondary | ICD-10-CM | POA: Diagnosis not present

## 2019-09-05 DIAGNOSIS — Z4800 Encounter for change or removal of nonsurgical wound dressing: Secondary | ICD-10-CM | POA: Diagnosis not present

## 2019-09-05 DIAGNOSIS — R609 Edema, unspecified: Secondary | ICD-10-CM | POA: Diagnosis not present

## 2019-09-08 ENCOUNTER — Encounter (HOSPITAL_BASED_OUTPATIENT_CLINIC_OR_DEPARTMENT_OTHER): Payer: Medicare Other | Attending: Internal Medicine | Admitting: Internal Medicine

## 2019-09-08 ENCOUNTER — Other Ambulatory Visit: Payer: Self-pay

## 2019-09-08 DIAGNOSIS — I872 Venous insufficiency (chronic) (peripheral): Secondary | ICD-10-CM | POA: Insufficient documentation

## 2019-09-08 DIAGNOSIS — L97522 Non-pressure chronic ulcer of other part of left foot with fat layer exposed: Secondary | ICD-10-CM | POA: Diagnosis not present

## 2019-09-08 DIAGNOSIS — I10 Essential (primary) hypertension: Secondary | ICD-10-CM | POA: Diagnosis not present

## 2019-09-08 DIAGNOSIS — L97512 Non-pressure chronic ulcer of other part of right foot with fat layer exposed: Secondary | ICD-10-CM | POA: Insufficient documentation

## 2019-09-08 NOTE — Progress Notes (Signed)
Susan [name], Susan [name] ([id number]) Visit Report for 09/08/2019 HPI Details Patient Name: Date of Service: Susan [name], Susan [name] 09/08/2019 12:30 PM Medical Record [id number] Patient Account Number: [id number] Date of Birth/Sex: Treating RN: Jun 27, 1924 (85 y.o. Nancy Fetter Primary Care Provider: Wilfred Lacy Other Clinician: Sandre Kitty Referring Provider: Treating Provider/Extender:Kalle Bernath, Gae Bon, Trilby Drummer in Treatment: 9 History of Present Illness HPI Description: ADMISSION 12/20/2018 This is a 85 year old woman who lives in the independent [facility] of [facility]. She has had and had a long history of wounds on the right lower extremity times years which apparently have been on and off. She was hospitalized from 12/06/2018 through 12/08/2018 with right greater than left lower extremity cellulitis she received IV cefazolin. She was discharged on oral clindamycin which she is completed she left with an order for Santyl. She has been using this daily to the wounds. She admits to lower extremity edema for several years. Past medical history of a CVA, hypertension. She is not a diabetic. ABI in our clinic was noncompressible 12/26/2018; this patient has wounds on the anterior right lower extremity in a worrisome area posteriorly. She was hospitalized early in January with cellulitis in the right leg as well. We ordered silver collagen last week however home health use silver alginate 01/03/2019 this patient has predominantly venous insufficiency wounds on the right posterior calf right anterior tibial area and right lateral calf. She has 1 area that is closed over. Currently 3 wounds. We have been using silver collagen with Kerlix Coban. She has noncompressible ABIs but her peripheral pulses are palpable 2/7; predominantly venous wounds on the right posterior calf and right anterior tibia and right lateral calf. The right lateral calf is closed. She has a new  open area which is small just below the area on the anterior tibia. She states this was not there when home health change the dressing 2 days ago. Most problematic area has been on the posterior calf 2/14; predominantly venous wounds on the right posterior calf right anterior and lateral calf are all closed. She has very fragile skin with large areas of hemosiderin deposition possibly some degree of solar skin damage. We had ordered her juxta lite stockings last week we have made no progress with this 07/07/19-Patient is reestablishing with wound clinic today after suffering a foot injury on 06/26/19 when she dropped narrating board at the independent living center she resides in. Patient went to ER where she was noted to have a 5 cm skin tear on the dorsum of the right foot she was given Keflex, set up with visiting nurses, and initiated on silver collagen dressing to the right foot which she has not been able to use on account of severe pain. Patient has just been using dressing with gauze. She has completed the Keflex course over 7 days. Patient now presents with open wound on the right dorsum with a slight hematoma on the right great toe base 8/6-Patient was in for a nurse visit but converted to physician visit on account of increasing swelling, pain, redness in the foot. Patient was dressed with silver collagen by home health patient also felt the silver collagen was causing some irritation and redness by itself 8/10-Patient returns from last week after initiating Augmentin for the left forefoot swelling, inflammation, painful ulcer with eschar, the dressing was changed to Iodoflex since the silver collagen was burning, the foot appears about the same if not slightly better this week. 8/17; readmitted to the clinic 2 weeks ago. This  wound is traumatic on the right anterior foot secondary to an ironing board dropping on the area. She had an x-ray that was negative. There may have been secondary  infection in this area and Dr. Evette Doffing added Augmentin. From an infection point of view the area seems better. We have been using silver collagen 8/24; changed her dressing to Iodoflex last week to help with debridement of the wound. She complains of a lot of pain in the tip of her toes. She did not want debridement 8/31; arrives with a much better looking wound today. Has been using Iodoflex I changed her to [facility]. 9/14; continued improvement today wound health surface looks healthy. I changed her to [facility] last time. Surface area is improved 9/21; overall wound area although she does have some epithelialization coming out but is an [place] in the middle of her wound. Still using Hydrofera Blue. Wound areas on the right dorsal foot 10/5; vast majority of this is epithelialized. Much improved from 2 weeks using Hydrofera Blue Electronic Signature(s) Signed: 09/08/2019 6:11:18 PM By: Linton Ham MD Entered By: Linton Ham on 09/08/2019 13:08:47 -------------------------------------------------------------------------------- Physical Exam Details Patient Name: Date of Service: Susan [name], Susan [name] 09/08/2019 12:30 PM Medical Record [id number] Patient Account Number: [id number] Date of Birth/Sex: Treating RN: 1923/12/08 (85 y.o. Nancy Fetter Primary Care Provider: Wilfred Lacy Other Clinician: Sandre Kitty Referring Provider: Treating Provider/Extender:Nelli Swalley, Gae Bon, Trilby Drummer in Treatment: 9 Constitutional Patient is hypertensive.. Pulse regular and within target range for patient.Marland Kitchen Respirations regular, non-labored and within target range.. Temperature is normal and within the target range for the patient.Marland Kitchen Respiratory work of breathing is normal. Cardiovascular Pedal pulses palpable on the right. No edema. Musculoskeletal She has bony outgrowth on the medial part of both feet this feels like a bone spur. On the right side some  irritation from the surgical shoe. Integumentary (Hair, Skin) No evidence of infection around the wound area. Notes Wound exam; right forefoot. No debridement is necessary the remaining tissue looks very healthy the vast majority of this is epithelialized. No evidence of infection Electronic Signature(s) Signed: 09/08/2019 6:11:18 PM By: Linton Ham MD Entered By: Linton Ham on 09/08/2019 13:10:32 -------------------------------------------------------------------------------- Physician Orders Details Patient Name: Date of Service: Susan [name], Susan [name] 09/08/2019 12:30 PM Medical Record [id number] Patient Account Number: [id number] Date of Birth/Sex: Treating RN: 05-Nov-1924 (85 y.o. Nancy Fetter Primary Care Provider: Wilfred Lacy Other Clinician: Sandre Kitty Referring Provider: Treating Provider/Extender:Jakyrah Holladay, Gae Bon, Trilby Drummer in Treatment: 9 Verbal / Phone Orders: No Diagnosis Coding ICD-10 Coding Code Description [phone] Non-pressure chronic ulcer of other part of right foot with fat layer exposed I87.2 Venous insufficiency (chronic) (peripheral) I10 Essential (primary) hypertension Follow-up Appointments Return Appointment in 2 weeks. Dressing Change Frequency Wound #5 Right,Dorsal Foot Change dressing three times week. Wound Cleansing Wound #5 Right,Dorsal Foot Clean wound with Wound Cleanser - or normal saline Primary Wound Dressing Wound #5 Right,Dorsal Foot Hydrofera Blue - classic moistened with saline Secondary Dressing Wound #5 Right,Dorsal Foot Kerlix/Rolled Gauze - secure with tape Dry Gauze Other: - apply foam to tender area on right medial foot for protection Edema Control Avoid standing for long periods of time Elevate legs to the level of the heart or above for 30 minutes daily and/or when sitting, a frequency of: - throughout the day. Off-Loading Open toe surgical shoe to: - right foot [place] skilled nursing for wound care. - [facility] Electronic Signature(s) Signed: 09/08/2019 6:11:18 PM By: Linton Ham MD Signed:  09/08/2019 6:17:12 PM By: Levan Hurst RN, BSN Entered By: Levan Hurst on 09/08/2019 13:07:00 -------------------------------------------------------------------------------- Problem List Details Patient Name: Date of Service: Susan [name], Susan [name] 09/08/2019 12:30 PM Medical Record [id number] Patient Account Number: [id number] Date of Birth/Sex: Treating RN: 23-May-1924 (85 y.o. Nancy Fetter Primary Care Provider: Wilfred Lacy Other Clinician: Sandre Kitty Referring Provider: Treating Provider/Extender:Evalyse Stroope, Gae Bon, Trilby Drummer in Treatment: 9 Active Problems ICD-10 Evaluated Encounter Code Description Active Date Today Diagnosis L97.512 Non-pressure chronic ulcer of other part of right foot 07/07/2019 No Yes with fat layer exposed I87.2 Venous insufficiency (chronic) (peripheral) 07/07/2019 No Yes I10 Essential (primary) hypertension 07/07/2019 No Yes Inactive Problems Resolved Problems Electronic Signature(s) Signed: 09/08/2019 6:11:18 PM By: Linton Ham MD Entered By: Linton Ham on 09/08/2019 13:08:04 -------------------------------------------------------------------------------- Progress Note Details Patient Name: Date of Service: Susan [name], Susan [name] 09/08/2019 12:30 PM Medical Record [id number] Patient Account Number: [id number] Date of Birth/Sex: Treating RN: 28-Mar-1924 (85 y.o. Nancy Fetter Primary Care Provider: Wilfred Lacy Other Clinician: Sandre Kitty Referring Provider: Treating Provider/Extender:Anniya Whiters, Gae Bon, Trilby Drummer in Treatment: 9 Subjective History of Present Illness (HPI) ADMISSION 12/20/2018 This is a 84 year old woman who lives in the independent [facility] of [facility]. She has had and had a long history of wounds on the right lower  extremity times years which apparently have been on and off. She was hospitalized from 12/06/2018 through 12/08/2018 with right greater than left lower extremity cellulitis she received IV cefazolin. She was discharged on oral clindamycin which she is completed she left with an order for Santyl. She has been using this daily to the wounds. She admits to lower extremity edema for several years. Past medical history of a CVA, hypertension. She is not a diabetic. ABI in our clinic was noncompressible 12/26/2018; this patient has wounds on the anterior right lower extremity in a worrisome area posteriorly. She was hospitalized early in January with cellulitis in the right leg as well. We ordered silver collagen last week however home health use silver alginate 01/03/2019 this patient has predominantly venous insufficiency wounds on the right posterior calf right anterior tibial area and right lateral calf. She has 1 area that is closed over. Currently 3 wounds. We have been using silver collagen with Kerlix Coban. She has noncompressible ABIs but her peripheral pulses are palpable 2/7; predominantly venous wounds on the right posterior calf and right anterior tibia and right lateral calf. The right lateral calf is closed. She has a new open area which is small just below the area on the anterior tibia. She states this was not there when home health change the dressing 2 days ago. Most problematic area has been on the posterior calf 2/14; predominantly venous wounds on the right posterior calf right anterior and lateral calf are all closed. She has very fragile skin with large areas of hemosiderin deposition possibly some degree of solar skin damage. We had ordered her juxta lite stockings last week we have made no progress with this 07/07/19-Patient is reestablishing with wound clinic today after suffering a foot injury on 06/26/19 when she dropped narrating board at the independent living center she resides  in. Patient went to ER where she was noted to have a 5 cm skin tear on the dorsum of the right foot she was given Keflex, set up with visiting nurses, and initiated on silver collagen dressing to the right foot which she has not been able to use on account of severe pain. Patient has just been using dressing  with gauze. She has completed the Keflex course over 7 days. Patient now presents with open wound on the right dorsum with a slight hematoma on the right great toe base 8/6-Patient was in for a nurse visit but converted to physician visit on account of increasing swelling, pain, redness in the foot. Patient was dressed with silver collagen by home health patient also felt the silver collagen was causing some irritation and redness by itself 8/10-Patient returns from last week after initiating Augmentin for the left forefoot swelling, inflammation, painful ulcer with eschar, the dressing was changed to Iodoflex since the silver collagen was burning, the foot appears about the same if not slightly better this week. 8/17; readmitted to the clinic 2 weeks ago. This wound is traumatic on the right anterior foot secondary to an ironing board dropping on the area. She had an x-ray that was negative. There may have been secondary infection in this area and Dr. Evette Doffing added Augmentin. From an infection point of view the area seems better. We have been using silver collagen 8/24; changed her dressing to Iodoflex last week to help with debridement of the wound. She complains of a lot of pain in the tip of her toes. She did not want debridement 8/31; arrives with a much better looking wound today. Has been using Iodoflex I changed her to [facility]. 9/14; continued improvement today wound health surface looks healthy. I changed her to [facility] last time. Surface area is improved 9/21; overall wound area although she does have some epithelialization coming out but is an [place] in the middle  of her wound. Still using Hydrofera Blue. Wound areas on the right dorsal foot 10/5; vast majority of this is epithelialized. Much improved from 2 weeks using Hydrofera Blue Objective Constitutional Patient is hypertensive.. Pulse regular and within target range for patient.Marland Kitchen Respirations regular, non-labored and within target range.. Temperature is normal and within the target range for the patient.. Vitals Time Taken: 12:53 PM, Height: 61 in, Weight: 109 lbs, BMI: 20.6, Temperature: 97.8 F, Pulse: 61 bpm, Respiratory Rate: 16 breaths/min, Blood Pressure: 169/59 mmHg. Respiratory work of breathing is normal. Cardiovascular Pedal pulses palpable on the right. No edema. Musculoskeletal She has bony outgrowth on the medial part of both feet this feels like a bone spur. On the right side some irritation from the surgical shoe. General Notes: Wound exam; right forefoot. No debridement is necessary the remaining tissue looks very healthy the vast majority of this is epithelialized. No evidence of infection Integumentary (Hair, Skin) No evidence of infection around the wound area. Wound #5 status is Open. Original cause of wound was Trauma. The wound is located on the Right,Dorsal Foot. The wound measures 1.3cm length x 1.8cm width x 0.1cm depth; 1.838cm^2 area and 0.184cm^3 volume. There is Fat Layer (Subcutaneous Tissue) Exposed exposed. There is no tunneling or undermining noted. There is a medium amount of serosanguineous drainage noted. The wound margin is flat and intact. There is large (67-100%) pink granulation within the wound bed. There is no necrotic tissue within the wound bed. Assessment Active Problems ICD-10 Non-pressure chronic ulcer of other part of right foot with fat layer exposed Venous insufficiency (chronic) (peripheral) Essential (primary) hypertension Plan Follow-up Appointments: Return Appointment in 2 weeks. Dressing Change Frequency: Wound #5 Right,Dorsal  Foot: Change dressing three times week. Wound Cleansing: Wound #5 Right,Dorsal Foot: Clean wound with Wound Cleanser - or normal saline Primary Wound Dressing: Wound #5 Right,Dorsal Foot: Hydrofera Blue - classic moistened with  saline Secondary Dressing: Wound #5 Right,Dorsal Foot: Kerlix/Rolled Gauze - secure with tape Dry Gauze Other: - apply foam to tender area on right medial foot for protection Edema Control: Avoid standing for long periods of time Elevate legs to the level of the heart or above for 30 minutes daily and/or when sitting, a frequency of: - throughout the day. Off-Loading: Open toe surgical shoe to: - right foot Home Health: [place] skilled nursing for wound care. - [facility] 1. Continue with Hydrofera Blue 2. 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 Nancy Fetter Primary Care Provider: Wilfred Lacy Other Clinician: Sandre Kitty Referring Provider: Treating Provider/Extender:Khadejah Son, Gae Bon, Trilby Drummer in Treatment: 9 Diagnosis Coding ICD-10 Codes Code Description [phone] Non-pressure chronic ulcer of other part of right foot with fat layer exposed I87.2 Venous insufficiency (chronic) (peripheral) I10 Essential (primary) hypertension Facility Procedures The patient participates with Medicare or their insurance follows the Medicare Facility Guidelines: CPT4 Code Description Modifier Quantity [id number] [place] VISIT-LEV 3 EST PT 1 Physician Procedures Electronic Signature(s) Signed: 09/08/2019 6:11:18 PM By:  Linton Ham MD Signed: 09/08/2019 6:17:12 PM By: Levan Hurst RN, BSN Entered By: Levan Hurst on 09/08/2019 18:02:57

## 2019-09-08 NOTE — Progress Notes (Signed)
Susan [name], Susan [name] ([id number]) Visit Report for 09/08/2019 Arrival Information Details Patient Name: Date of Service: Susan [name], Susan [name] 09/08/2019 12:30 PM Medical Record [id number] Patient Account Number: [id number] Date of Birth/Sex: Treating RN: [birth year] (85 y.o. Nancy Fetter Primary Care Turon Kilmer: Wilfred Lacy Other Clinician: Sandre Kitty Referring Vittorio Mohs: Treating Kina Shiffman/Extender:Robson, Gae Bon, Trilby Drummer in Treatment: 9 Visit Information History Since Last Visit Added or deleted any medications: No Patient Arrived: Wheel Chair Any new allergies or adverse reactions: No Arrival Time: 12:52 Had a fall or experienced change in No activities of daily living that may affect Accompanied By: daughter risk of falls: Transfer Assistance: None Signs or symptoms of abuse/neglect since last No Patient Identification Verified: Yes visito Secondary Verification Process Completed: Yes Hospitalized since last visit: No Patient Requires Transmission-Based No Implantable device outside of the clinic excluding No Precautions: cellular tissue based products placed in the center Patient Has Alerts: No since last visit: Has Dressing in Place as Prescribed: Yes Pain Present Now: No Electronic Signature(s) Signed: 09/08/2019 6:17:12 PM By: Levan Hurst RN, BSN Entered By: Levan Hurst on 09/08/2019 12:52:19 -------------------------------------------------------------------------------- Clinic Level of Care Assessment Details Patient Name: Date of Service: Susan [name] 09/08/2019 12:30 PM Medical Record [id number] Patient Account Number: [id number] Date of Birth/Sex: Treating RN: 1924-03-06 (85 y.o. Nancy Fetter Primary Care Seanmichael Salmons: Wilfred Lacy Other Clinician: Sandre Kitty Referring Tayten Heber: Treating Bernece Gall/Extender:Robson, Gae Bon, Trilby Drummer in Treatment: 9 Clinic Level of Care Assessment Items TOOL 4 Quantity  Score X - Use when only an EandM is performed on FOLLOW-UP visit 1 0 ASSESSMENTS - Nursing Assessment / Reassessment X - Reassessment of Co-morbidities (includes updates in patient status) 1 10 X - Reassessment of Adherence to Treatment Plan 1 5 ASSESSMENTS - Wound and Skin Assessment / Reassessment X - Simple Wound Assessment / Reassessment - one wound 1 5 _0  - Complex Wound Assessment / Reassessment - multiple wounds 0 _1  - Dermatologic / Skin Assessment (not related to wound area) 0 ASSESSMENTS - Focused Assessment _2  - Circumferential Edema Measurements - multi extremities 0 _3  - Nutritional Assessment / Counseling / Intervention 0 X - Lower Extremity Assessment (monofilament, tuning fork, pulses) 1 5 _4  - Peripheral Arterial Disease Assessment (using hand held doppler) 0 ASSESSMENTS - Ostomy and/or Continence Assessment and Care _5  - Incontinence Assessment and Management 0 _6  - Ostomy Care Assessment and Management (repouching, etc.) 0 PROCESS - Coordination of Care X - Simple Patient / Family Education for ongoing care 1 15 _7  - Complex (extensive) Patient / Family Education for ongoing care 0 X - Staff obtains Programmer, systems, Records, Test Results / Process Orders 1 10 X - Staff telephones HHA, Nursing Homes / Clarify orders / etc 1 10 _8  - Routine Transfer to another Facility (non-emergent condition) 0 _9  - Routine Hospital Admission (non-emergent condition) 0 _10  - New Admissions / Biomedical engineer / Ordering NPWT, Apligraf, etc. 0 _11  - Emergency Hospital Admission (emergent condition) 0 X - Simple Discharge Coordination 1 10 _12  - Complex (extensive) Discharge Coordination 0 PROCESS - Special Needs _13  - Pediatric / Minor Patient Management 0 _14  - Isolation Patient Management 0 _15  - Hearing / Language / Visual special needs 0 _16  - Assessment of Community assistance (transportation, D/C planning, etc.) 0 _17  - Additional assistance / Altered mentation 0 _18  - Support  Surface(s) Assessment (bed, cushion, seat, etc.) 0 INTERVENTIONS - Wound Cleansing / Measurement X - Simple Wound Cleansing - one wound 1 5 _19  - Complex Wound Cleansing - multiple wounds 0  X - Wound Imaging (photographs - any number of wounds) 1 5 _0  - Wound Tracing (instead of photographs) 0 X - Simple Wound Measurement - one wound 1 5 _1  - Complex Wound Measurement - multiple wounds 0 INTERVENTIONS - Wound Dressings X - Small Wound Dressing one or multiple wounds 1 10 _2  - Medium Wound Dressing one or multiple wounds 0 _3  - Large Wound Dressing one or multiple wounds 0 X - Application of Medications - topical 1 5 <[id number]>_5<\/[id number]>_9  - Application of Medications - injection 0 INTERVENTIONS - Miscellaneous _5  - External ear exam 0 _6  - Specimen Collection (cultures, biopsies, blood, body fluids, etc.) 0 _7  - Specimen(s) / Culture(s) sent or taken to Lab for analysis 0 _8  - Patient Transfer (multiple staff / Civil Service fast streamer / Similar devices) 0 _9  - Simple Staple / Suture removal (25 or less) 0 _10  - Complex Staple / Suture removal (26 or more) 0 _11  - Hypo / Hyperglycemic Management (close monitor of Blood Glucose) 0 _12  - Ankle / Brachial Index (ABI) - do not check if billed separately 0 X - Vital Signs 1 5 Has the patient been seen at the hospital within the last three years: Yes Total Score: 105 Level Of Care: New/Established - Level 3 Electronic Signature(s) Signed: 09/08/2019 6:17:12 PM By: Levan Hurst RN, BSN Entered By: Levan Hurst on 09/08/2019 18:02:48 -------------------------------------------------------------------------------- Encounter Discharge Information Details Patient Name: Date of Service: Susan [name], Susan [name] 09/08/2019 12:30 PM Medical Record [id number] Patient Account Number: [id number] Date of Birth/Sex: Treating RN: Nov 02, 1924 (85 y.o. Nancy Fetter Primary Care Kaneshia Cater: Wilfred Lacy Other Clinician: Sandre Kitty Referring [place] Crance: Treating  Wesleigh Markovic/Extender:Robson, Gae Bon, Trilby Drummer in Treatment: 9 Encounter Discharge Information Items Discharge Condition: Stable Ambulatory Status: Wheelchair Discharge Destination: Home Transportation: Private Auto Accompanied By: daughter Schedule Follow-up Appointment: Yes Clinical Summary of Care: Patient Declined Electronic Signature(s) Signed: 09/08/2019 6:17:12 PM By: Levan Hurst RN, BSN Entered By: Levan Hurst on 09/08/2019 18:04:10 -------------------------------------------------------------------------------- Lower Extremity Assessment Details Patient Name: Date of Service: Susan [name], Susan [name] 09/08/2019 12:30 PM Medical Record [id number] Patient Account Number: [id number] Date of Birth/Sex: Treating RN: 1924-01-10 (85 y.o. Nancy Fetter Primary Care Khristi Schiller: Wilfred Lacy Other Clinician: Sandre Kitty Referring Caoilainn Sacks: Treating Armin Yerger/Extender:Robson, Gae Bon, Trilby Drummer in Treatment: 9 Edema Assessment Assessed: [Left: No] [Right: No] Edema: [Left: N] [Right: o] Calf Left: Right: Point of Measurement: 36 cm From Medial Instep cm 24.5 cm Ankle Left: Right: Point of Measurement: 10 cm From Medial Instep cm 18 cm Vascular Assessment Pulses: Dorsalis Pedis Palpable: [Right:Yes] Electronic Signature(s) Signed: 09/08/2019 6:17:12 PM By: Levan Hurst RN, BSN Entered By: Levan Hurst on 09/08/2019 12:59:04 -------------------------------------------------------------------------------- Multi Wound Chart Details Patient Name: Date of Service: Susan [name], Susan [name] 09/08/2019 12:30 PM Medical Record [id number] Patient Account Number: [id number] Date of Birth/Sex: Treating RN: 11-Jul-1924 (85 y.o. Nancy Fetter Primary Care Francys Bolin: Wilfred Lacy Other Clinician: Sandre Kitty Referring Sutton Hirsch: Treating Debria Broecker/Extender:Robson, Gae Bon, Trilby Drummer in Treatment: 9 Vital Signs Height(in):  61 Pulse(bpm): 81 Weight(lbs): 109 Blood Pressure(mmHg): 169/59 Body Mass Index(BMI): 21 Temperature(F): 97.8 Respiratory 16 Rate(breaths/min): Photos: [5:No Photos] [N/A:N/A] Wound Location: [5:Right Foot - Dorsal] [N/A:N/A] Wounding Event: [5:Trauma] [N/A:N/A] Primary Etiology: [5:Trauma, Other] [N/A:N/A] Date Acquired: [5:06/26/2019] [N/A:N/A] Weeks of Treatment: [5:9] [N/A:N/A] Wound Status: [5:Open] [N/A:N/A] Measurements L x W x D [5:1.3x1.8x0.1] [N/A:N/A] (cm) Area (cm) : [5:1.838] [N/A:N/A] Volume (cm) : [5:0.184] [N/A:N/A] % Reduction in Area: [5:83.70%] [N/A:N/A] % Reduction in Volume: [5:83.70%] [N/A:N/A] Classification: [5:Full Thickness Without Exposed Support Structures] [N/A:N/A] Exudate  Amount: [5:Medium] [N/A:N/A] Exudate Type: [5:Serosanguineous] [N/A:N/A] Exudate Color: [5:red, brown] [N/A:N/A] Wound Margin: [5:Flat and Intact] [N/A:N/A] Granulation Amount: [5:Large (67-100%)] [N/A:N/A] Granulation Quality: [5:Pink] [N/A:N/A] Necrotic Amount: [5:None Present (0%)] [N/A:N/A] Exposed Structures: [5:Fat Layer (Subcutaneous N/A Tissue) Exposed: Yes Fascia: No Tendon: No Muscle: No Joint: No Bone: No Medium (34-66%)] [N/A:N/A] Treatment Notes Electronic Signature(s) Signed: 09/08/2019 6:11:18 PM By: Linton Ham MD Signed: 09/08/2019 6:17:12 PM By: Levan Hurst RN, BSN Entered By: Linton Ham on 09/08/2019 13:08:11 -------------------------------------------------------------------------------- Multi-Disciplinary Care Plan Details Patient Name: Date of Service: Susan [name], Susan [name] 09/08/2019 12:30 PM Medical Record [id number] Patient Account Number: [id number] Date of Birth/Sex: Treating RN: 04-21-1924 (85 y.o. Nancy Fetter Primary Care Maryann Mccall: Wilfred Lacy Other Clinician: Sandre Kitty Referring Carston Riedl: Treating Paulino Cork/Extender:Robson, Gae Bon, Trilby Drummer in Treatment: 9 Active Inactive Wound/Skin  Impairment Nursing Diagnoses: Impaired tissue integrity Knowledge deficit related to ulceration/compromised skin integrity Goals: Patient/caregiver will verbalize understanding of skin care regimen Date Initiated: 07/07/2019 Target Resolution Date: 10/03/2019 Goal Status: Active Ulcer/skin breakdown will have a volume reduction of 30% by week 4 Date Initiated: 07/07/2019 Date Inactivated: 08/04/2019 Target Resolution Date: 08/08/2019 Unmet Reason: multiple Goal Status: Unmet debridements Ulcer/skin breakdown will have a volume reduction of 50% by week 8 Date Initiated: 08/04/2019 Date Inactivated: 09/08/2019 Target Resolution Date: 09/05/2019 Goal Status: Met Ulcer/skin breakdown will have a volume reduction of 80% by week 12 Date Initiated: 09/08/2019 Target Resolution Date: 10/03/2019 Goal Status: Active Interventions: Assess patient/caregiver ability to obtain necessary supplies Assess patient/caregiver ability to perform ulcer/skin care regimen upon admission and as needed Assess ulceration(s) every visit Provide education on ulcer and skin care Notes: Electronic Signature(s) Signed: 09/08/2019 6:17:12 PM By: Levan Hurst RN, BSN Entered By: Levan Hurst on 09/08/2019 13:03:28 -------------------------------------------------------------------------------- Pain Assessment Details Patient Name: Date of Service: Susan [name], Susan [name] 09/08/2019 12:30 PM Medical Record [id number] Patient Account Number: [id number] Date of Birth/Sex: Treating RN: 02/23/1924 (85 y.o. Nancy Fetter Primary Care Bina Veenstra: Wilfred Lacy Other Clinician: Sandre Kitty Referring Louvinia Cumbo: Treating Yaretsi Humphres/Extender:Robson, Gae Bon, Trilby Drummer in Treatment: 9 Active Problems Location of Pain Severity and Description of Pain Patient Has Paino No Site Locations Pain Management and Medication Current Pain Management: Electronic Signature(s) Signed: 09/08/2019 6:17:12 PM By: Levan Hurst RN, BSN Entered By: Levan Hurst on 09/08/2019 12:52:29 -------------------------------------------------------------------------------- Patient/Caregiver Education Details Patient Name: Date of Service: Susan [name] 10/5/2020andnbsp12:30 PM Medical Record Patient Account Number: [id number] Number: Treating RN: Levan Hurst Date of Birth/Gender: 28-Feb-1924 (85 y.o. F) Date of Birth/Gender: 01-Aug-1924 (85 y.o. F) Other Clinician: Sandre Kitty Primary Care Physician: Wells Guiles Referring Physician: Physician/Extender: Varney Baas in Treatment: 9 Education Assessment Education Provided To: Patient Education Topics Provided Wound/Skin Impairment: Methods: Explain/Verbal Responses: State content correctly [employer]) Signed: 09/08/2019 6:17:12 PM By: Levan Hurst RN, BSN Entered By: Levan Hurst on 09/08/2019 13:03:43 -------------------------------------------------------------------------------- Wound Assessment Details Patient Name: Date of Service: Susan [name], Susan [name] 09/08/2019 12:30 PM Medical Record [id number] Patient Account Number: [id number] Date of Birth/Sex: Treating RN: [birth year] (85 y.o. Nancy Fetter Primary Care Kriss Perleberg: Wilfred Lacy Other Clinician: Sandre Kitty Referring Delfino Friesen: Treating Jerrie Schussler/Extender:Robson, Gae Bon, Trilby Drummer in Treatment: 9 Wound Status Wound Number: 5 Primary Etiology: Trauma, Other Wound Location: Right Foot - Dorsal Wound Status: Open Wounding Event: Trauma Date Acquired: 06/26/2019 Weeks Of Treatment: 9 Clustered Wound: No Wound Measurements Length: (cm) 1.3 % Reduct Width: (cm) 1.8 % Reduct Depth: (cm) 0.1 Epitheli Area: (cm) 1.838 Tunneli Volume: (cm) 0.184 Undermi Wound Description Classification: Full Thickness  Without Exposed Support Foul Od Structures Slough/ Wound Flat and Intact Margin: Exudate  Medium Amount: Exudate Exudate Serosanguineous Type: Exudate red, brown Color: Wound Bed Granulation Amount: Large (67-100%) Granulation Quality: Pink Fascia Expo Necrotic Amount: None Present (0%) Fat Layer ( Tendon Expo Muscle Expo Joint Expos Bone Expose or After Cleansing: No Fibrino Yes Exposed Structure sed: No Subcutaneous Tissue) Exposed: Yes sed: No sed: No ed: No d: No ion in Area: 83.7% ion in Volume: 83.7% alization: Medium (34-66%) ng: No ning: No Treatment Notes Wound #5 (Right, Dorsal Foot) 1. Cleanse With Wound Cleanser 3. Primary Dressing Applied Hydrofera Blue 4. Secondary Dressing Dry Gauze Roll Gauze 5. Secured With Tape Notes netting. Electronic Signature(s) Signed: 09/08/2019 6:17:12 PM By: Levan Hurst RN, BSN Entered By: Levan Hurst on 09/08/2019 12:58:45 -------------------------------------------------------------------------------- Jones Details Patient Name: Date of Service: Susan [name], Susan [name] 09/08/2019 12:30 PM Medical Record [id number] Patient Account Number: [id number] Date of Birth/Sex: Treating RN: 1924/03/22 (85 y.o. Nancy Fetter Primary Care Josiah Wojtaszek: Wilfred Lacy Other Clinician: Sandre Kitty Referring Michelyn Scullin: Treating Rashauna Tep/Extender:Robson, Gae Bon, Trilby Drummer in Treatment: 9 Vital Signs Time Taken: 12:53 Temperature (F): 97.8 Height (in): 61 Pulse (bpm): 61 Weight (lbs): 109 Respiratory Rate (breaths/min): 16 Body Mass Index (BMI): 20.6 Blood Pressure (mmHg): 169/59 Reference Range: 80 - 120 mg / dl Electronic Signature(s) Signed: 09/08/2019 6:17:12 PM By: Levan Hurst RN, BSN Entered By: Levan Hurst on 09/08/2019 12:54:02

## 2019-09-10 DIAGNOSIS — Z4800 Encounter for change or removal of nonsurgical wound dressing: Secondary | ICD-10-CM | POA: Diagnosis not present

## 2019-09-10 DIAGNOSIS — I872 Venous insufficiency (chronic) (peripheral): Secondary | ICD-10-CM | POA: Diagnosis not present

## 2019-09-10 DIAGNOSIS — R609 Edema, unspecified: Secondary | ICD-10-CM | POA: Diagnosis not present

## 2019-09-10 DIAGNOSIS — I10 Essential (primary) hypertension: Secondary | ICD-10-CM | POA: Diagnosis not present

## 2019-09-10 DIAGNOSIS — L97512 Non-pressure chronic ulcer of other part of right foot with fat layer exposed: Secondary | ICD-10-CM | POA: Diagnosis not present

## 2019-09-12 DIAGNOSIS — Z4800 Encounter for change or removal of nonsurgical wound dressing: Secondary | ICD-10-CM | POA: Diagnosis not present

## 2019-09-12 DIAGNOSIS — I872 Venous insufficiency (chronic) (peripheral): Secondary | ICD-10-CM | POA: Diagnosis not present

## 2019-09-12 DIAGNOSIS — R609 Edema, unspecified: Secondary | ICD-10-CM | POA: Diagnosis not present

## 2019-09-12 DIAGNOSIS — I10 Essential (primary) hypertension: Secondary | ICD-10-CM | POA: Diagnosis not present

## 2019-09-12 DIAGNOSIS — L97512 Non-pressure chronic ulcer of other part of right foot with fat layer exposed: Secondary | ICD-10-CM | POA: Diagnosis not present

## 2019-09-15 ENCOUNTER — Encounter (HOSPITAL_BASED_OUTPATIENT_CLINIC_OR_DEPARTMENT_OTHER): Payer: Medicare Other | Admitting: Internal Medicine

## 2019-09-15 DIAGNOSIS — I872 Venous insufficiency (chronic) (peripheral): Secondary | ICD-10-CM | POA: Diagnosis not present

## 2019-09-15 DIAGNOSIS — L97512 Non-pressure chronic ulcer of other part of right foot with fat layer exposed: Secondary | ICD-10-CM | POA: Diagnosis not present

## 2019-09-15 DIAGNOSIS — R609 Edema, unspecified: Secondary | ICD-10-CM | POA: Diagnosis not present

## 2019-09-15 DIAGNOSIS — Z4800 Encounter for change or removal of nonsurgical wound dressing: Secondary | ICD-10-CM | POA: Diagnosis not present

## 2019-09-15 DIAGNOSIS — I10 Essential (primary) hypertension: Secondary | ICD-10-CM | POA: Diagnosis not present

## 2019-09-17 DIAGNOSIS — R609 Edema, unspecified: Secondary | ICD-10-CM | POA: Diagnosis not present

## 2019-09-17 DIAGNOSIS — Z4800 Encounter for change or removal of nonsurgical wound dressing: Secondary | ICD-10-CM | POA: Diagnosis not present

## 2019-09-17 DIAGNOSIS — L97512 Non-pressure chronic ulcer of other part of right foot with fat layer exposed: Secondary | ICD-10-CM | POA: Diagnosis not present

## 2019-09-17 DIAGNOSIS — I10 Essential (primary) hypertension: Secondary | ICD-10-CM | POA: Diagnosis not present

## 2019-09-17 DIAGNOSIS — I872 Venous insufficiency (chronic) (peripheral): Secondary | ICD-10-CM | POA: Diagnosis not present

## 2019-09-19 DIAGNOSIS — R609 Edema, unspecified: Secondary | ICD-10-CM | POA: Diagnosis not present

## 2019-09-19 DIAGNOSIS — L97512 Non-pressure chronic ulcer of other part of right foot with fat layer exposed: Secondary | ICD-10-CM | POA: Diagnosis not present

## 2019-09-19 DIAGNOSIS — I10 Essential (primary) hypertension: Secondary | ICD-10-CM | POA: Diagnosis not present

## 2019-09-19 DIAGNOSIS — I872 Venous insufficiency (chronic) (peripheral): Secondary | ICD-10-CM | POA: Diagnosis not present

## 2019-09-19 DIAGNOSIS — Z4800 Encounter for change or removal of nonsurgical wound dressing: Secondary | ICD-10-CM | POA: Diagnosis not present

## 2019-09-22 ENCOUNTER — Other Ambulatory Visit: Payer: Self-pay

## 2019-09-22 ENCOUNTER — Encounter (HOSPITAL_BASED_OUTPATIENT_CLINIC_OR_DEPARTMENT_OTHER): Payer: Medicare Other | Admitting: Internal Medicine

## 2019-09-22 DIAGNOSIS — I10 Essential (primary) hypertension: Secondary | ICD-10-CM | POA: Diagnosis not present

## 2019-09-22 DIAGNOSIS — L97519 Non-pressure chronic ulcer of other part of right foot with unspecified severity: Secondary | ICD-10-CM | POA: Diagnosis not present

## 2019-09-22 DIAGNOSIS — L97512 Non-pressure chronic ulcer of other part of right foot with fat layer exposed: Secondary | ICD-10-CM | POA: Diagnosis not present

## 2019-09-22 DIAGNOSIS — I872 Venous insufficiency (chronic) (peripheral): Secondary | ICD-10-CM | POA: Diagnosis not present

## 2019-09-24 DIAGNOSIS — Z4800 Encounter for change or removal of nonsurgical wound dressing: Secondary | ICD-10-CM | POA: Diagnosis not present

## 2019-09-24 DIAGNOSIS — I10 Essential (primary) hypertension: Secondary | ICD-10-CM | POA: Diagnosis not present

## 2019-09-24 DIAGNOSIS — L97512 Non-pressure chronic ulcer of other part of right foot with fat layer exposed: Secondary | ICD-10-CM | POA: Diagnosis not present

## 2019-09-24 DIAGNOSIS — I872 Venous insufficiency (chronic) (peripheral): Secondary | ICD-10-CM | POA: Diagnosis not present

## 2019-09-24 DIAGNOSIS — R609 Edema, unspecified: Secondary | ICD-10-CM | POA: Diagnosis not present

## 2019-09-24 NOTE — Progress Notes (Signed)
TONNETTE, ZATORSKI ([id number]) Visit Report for 09/22/2019 HPI Details Patient Name: Date of Service: Susan [name], Susan [name] 09/22/2019 12:30 PM Medical Record [id number] Patient Account Number: [id number] Date of Birth/Sex: Treating RN: January 06, 1924 (85 y.o. Susan [name] Primary Care Provider: Wilfred Lacy Other Clinician: Referring Provider: Treating Provider/Extender:Robson, Gae Bon, Trilby Drummer in Treatment: 11 History of Present Illness HPI Description: ADMISSION 12/20/2018 This is a 85 year old woman who lives in the independent part of [facility]. She has had and had a long history of wounds on the right lower extremity times years which apparently have been on and off. She was hospitalized from 12/06/2018 through 12/08/2018 with right greater than left lower extremity cellulitis she received IV cefazolin. She was discharged on oral clindamycin which she is completed she left with an order for Santyl. She has been using this daily to the wounds. She admits to lower extremity edema for several years. Past medical history of a CVA, hypertension. She is not a diabetic. ABI in our clinic was noncompressible 12/26/2018; this patient has wounds on the anterior right lower extremity in a worrisome area posteriorly. She was hospitalized early in January with cellulitis in the right leg as well. We ordered silver collagen last week however home health use silver alginate 01/03/2019 this patient has predominantly venous insufficiency wounds on the right posterior calf right anterior tibial area and right lateral calf. She has 1 area that is closed over. Currently 3 wounds. We have been using silver collagen with Kerlix Coban. She has noncompressible ABIs but her peripheral pulses are palpable 2/7; predominantly venous wounds on the right posterior calf and right anterior tibia and right lateral calf. The right lateral calf is closed. She has a new open area which  is small just below the area on the anterior tibia. She states this was not there when home health change the dressing 2 days ago. Most problematic area has been on the posterior calf 2/14; predominantly venous wounds on the right posterior calf right anterior and lateral calf are all closed. She has very fragile skin with large areas of hemosiderin deposition possibly some degree of solar skin damage. We had ordered her juxta lite stockings last week we have made no progress with this 07/07/19-Patient is reestablishing with wound clinic today after suffering a foot injury on 06/26/19 when she dropped narrating board at the independent living center she resides in. Patient went to ER where she was noted to have a 5 cm skin tear on the dorsum of the right foot she was given Keflex, set up with visiting nurses, and initiated on silver collagen dressing to the right foot which she has not been able to use on account of severe pain. Patient has just been using dressing with gauze. She has completed the Keflex course over 7 days. Patient now presents with open wound on the right dorsum with a slight hematoma on the right great toe base 8/6-Patient was in for a nurse visit but converted to physician visit on account of increasing swelling, pain, redness in the foot. Patient was dressed with silver collagen by home health patient also felt the silver collagen was causing some irritation and redness by itself 8/10-Patient returns from last week after initiating Augmentin for the left forefoot swelling, inflammation, painful ulcer with eschar, the dressing was changed to Iodoflex since the silver collagen was burning, the foot appears about the same if not slightly better this week. 8/17; readmitted to the clinic 2 weeks ago. This wound is  traumatic on the right anterior foot secondary to an ironing board dropping on the area. She had an x-ray that was negative. There may have been secondary infection in  this area and Dr. Evette Doffing added Augmentin. From an infection point of view the area seems better. We have been using silver collagen 8/24; changed her dressing to Iodoflex last week to help with debridement of the wound. She complains of a lot of pain in the tip of her toes. She did not want debridement 8/31; arrives with a much better looking wound today. Has been using Iodoflex I changed her to [facility]. 9/14; continued improvement today wound health surface looks healthy. I changed her to [facility] last time. Surface area is improved 9/21; overall wound area although she does have some epithelialization coming out but is an [place] in the middle of her wound. Still using Hydrofera Blue. Wound areas on the right dorsal foot 10/5; vast majority of this is epithelialized. Much improved from 2 weeks using Hydrofera Blue 10/19; patient's wound is closed on the dorsal right foot. This was initially trauma. This was initially caused by our ironing board trauma. Electronic Signature(s) Signed: 09/22/2019 5:54:03 PM By: Linton Ham MD Entered By: Linton Ham on 09/22/2019 14:51:02 -------------------------------------------------------------------------------- Physical Exam Details Patient Name: Date of Service: Susan [name] 09/22/2019 12:30 PM Medical Record [id number] Patient Account Number: [id number] Date of Birth/Sex: Treating RN: 15-Mar-1924 (85 y.o. Susan [name] Primary Care Provider: Wilfred Lacy Other Clinician: Referring Provider: Treating Provider/Extender:Robson, Gae Bon, Trilby Drummer in Treatment: 11 Constitutional Patient is hypertensive.. Pulse regular and within target range for patient.Marland Kitchen Respirations regular, non-labored and within target range.. Temperature is normal and within the target range for the patient.Marland Kitchen Appears in no distress. Notes A. fib; right forefoot. Everything is closed here. No evidence of surrounding  infection Electronic Signature(s) Signed: 09/22/2019 5:54:03 PM By: Linton Ham MD Entered By: Linton Ham on 09/22/2019 14:51:50 -------------------------------------------------------------------------------- Physician Orders Details Patient Name: Date of Service: VALISHA, MAZZUCA 09/22/2019 12:30 PM Medical Record [id number] Patient Account Number: [id number] Date of Birth/Sex: Treating RN: Oct 01, 1924 (85 y.o. Susan [name] Primary Care Provider: Wilfred Lacy Other Clinician: Referring Provider: Treating Provider/Extender:Robson, Gae Bon, Trilby Drummer in Treatment: 11 Verbal / Phone Orders: No Diagnosis Coding ICD-10 Coding Code Description L97.512 Non-pressure chronic ulcer of other part of right foot with fat layer exposed I87.2 Venous insufficiency (chronic) (peripheral) I10 Essential (primary) hypertension Discharge From [facility] Services Discharge from [place] Primary Wound Dressing Other: - may keep top of right foot padded for protection Edema Control Avoid standing for long periods of time Elevate legs to the level of the heart or above for 30 minutes daily and/or when sitting, a frequency of: - throughout the day. Off-Loading Open toe surgical shoe to: - right foot [place] [facility] - [facility] - may d/c skilled nursing - wound healed Electronic Signature(s) Signed: 09/22/2019 5:54:03 PM By: Linton Ham MD Signed: 09/24/2019 6:50:57 PM By: Levan Hurst RN, BSN Entered By: Levan Hurst on 09/22/2019 13:55:53 -------------------------------------------------------------------------------- Problem List Details Patient Name: Date of Service: LESETTE, BUSEY 09/22/2019 12:30 PM Medical Record [id number] Patient Account Number: [id number] Date of Birth/Sex: Treating RN: 1924/06/19 (85 y.o. Susan [name] Primary Care Provider: Wilfred Lacy Other Clinician: Referring Provider: Treating  Provider/Extender:Robson, Gae Bon, Trilby Drummer in Treatment: 11 Active Problems ICD-10 Evaluated Encounter Code Description Active Date Today Diagnosis L97.512 Non-pressure chronic ulcer of other part of right foot 07/07/2019 No Yes with fat layer exposed I87.2  Venous insufficiency (chronic) (peripheral) 07/07/2019 No Yes I10 Essential (primary) hypertension 07/07/2019 No Yes Inactive Problems Resolved Problems Electronic Signature(s) Signed: 09/22/2019 5:54:03 PM By: Linton Ham MD Signed: 09/24/2019 6:50:57 PM By: Levan Hurst RN, BSN Entered By: Levan Hurst on 09/22/2019 13:54:14 -------------------------------------------------------------------------------- Progress Note Details Patient Name: Date of Service: NYKITA, MUZZY 09/22/2019 12:30 PM Medical Record [id number] Patient Account Number: [id number] Date of Birth/Sex: Treating RN: [birth year] (85 y.o. Susan [name] Primary Care Provider: Wilfred Lacy Other Clinician: Referring Provider: Treating Provider/Extender:Robson, Gae Bon, Trilby Drummer in Treatment: 11 Subjective History of Present Illness (HPI) ADMISSION 12/20/2018 This is a 85 year old woman who lives in the independent part of [facility]. She has had and had a long history of wounds on the right lower extremity times years which apparently have been on and off. She was hospitalized from 12/06/2018 through 12/08/2018 with right greater than left lower extremity cellulitis she received IV cefazolin. She was discharged on oral clindamycin which she is completed she left with an order for Santyl. She has been using this daily to the wounds. She admits to lower extremity edema for several years. Past medical history of a CVA, hypertension. She is not a diabetic. ABI in our clinic was noncompressible 12/26/2018; this patient has wounds on the anterior right lower extremity in a worrisome area posteriorly. She  was hospitalized early in January with cellulitis in the right leg as well. We ordered silver collagen last week however home health use silver alginate 01/03/2019 this patient has predominantly venous insufficiency wounds on the right posterior calf right anterior tibial area and right lateral calf. She has 1 area that is closed over. Currently 3 wounds. We have been using silver collagen with Kerlix Coban. She has noncompressible ABIs but her peripheral pulses are palpable 2/7; predominantly venous wounds on the right posterior calf and right anterior tibia and right lateral calf. The right lateral calf is closed. She has a new open area which is small just below the area on the anterior tibia. She states this was not there when home health change the dressing 2 days ago. Most problematic area has been on the posterior calf 2/14; predominantly venous wounds on the right posterior calf right anterior and lateral calf are all closed. She has very fragile skin with large areas of hemosiderin deposition possibly some degree of solar skin damage. We had ordered her juxta lite stockings last week we have made no progress with this 07/07/19-Patient is reestablishing with wound clinic today after suffering a foot injury on 06/26/19 when she dropped narrating board at the independent living center she resides in. Patient went to ER where she was noted to have a 5 cm skin tear on the dorsum of the right foot she was given Keflex, set up with visiting nurses, and initiated on silver collagen dressing to the right foot which she has not been able to use on account of severe pain. Patient has just been using dressing with gauze. She has completed the Keflex course over 7 days. Patient now presents with open wound on the right dorsum with a slight hematoma on the right great toe base 8/6-Patient was in for a nurse visit but converted to physician visit on account of increasing swelling, pain, redness in the  foot. Patient was dressed with silver collagen by home health patient also felt the silver collagen was causing some irritation and redness by itself 8/10-Patient returns from last week after initiating Augmentin for the left forefoot swelling,  inflammation, painful ulcer with eschar, the dressing was changed to Iodoflex since the silver collagen was burning, the foot appears about the same if not slightly better this week. 8/17; readmitted to the clinic 2 weeks ago. This wound is traumatic on the right anterior foot secondary to an ironing board dropping on the area. She had an x-ray that was negative. There may have been secondary infection in this area and Dr. Evette Doffing added Augmentin. From an infection point of view the area seems better. We have been using silver collagen 8/24; changed her dressing to Iodoflex last week to help with debridement of the wound. She complains of a lot of pain in the tip of her toes. She did not want debridement 8/31; arrives with a much better looking wound today. Has been using Iodoflex I changed her to [facility]. 9/14; continued improvement today wound health surface looks healthy. I changed her to [facility] last time. Surface area is improved 9/21; overall wound area although she does have some epithelialization coming out but is an [place] in the middle of her wound. Still using Hydrofera Blue. Wound areas on the right dorsal foot 10/5; vast majority of this is epithelialized. Much improved from 2 weeks using Hydrofera Blue 10/19; patient's wound is closed on the dorsal right foot. This was initially trauma. This was initially caused by our ironing board trauma. Objective Constitutional Patient is hypertensive.. Pulse regular and within target range for patient.Marland Kitchen Respirations regular, non-labored and within target range.. Temperature is normal and within the target range for the patient.Marland Kitchen Appears in no distress. Vitals Time Taken: 1:06 PM, Height:  61 in, Weight: 109 lbs, BMI: 20.6, Temperature: 98.7 F, Pulse: 55 bpm, Respiratory Rate: 16 breaths/min, Blood Pressure: 143/63 mmHg. General Notes: A. fib; right forefoot. Everything is closed here. No evidence of surrounding infection Integumentary (Hair, Skin) Wound #5 status is Healed - Epithelialized. Original cause of wound was Trauma. The wound is located on the Right,Dorsal Foot. The wound measures 0cm length x 0cm width x 0cm depth; 0cm^2 area and 0cm^3 volume. There is no tunneling or undermining noted. There is a none present amount of drainage noted. The wound margin is flat and intact. There is no granulation within the wound bed. There is no necrotic tissue within the wound bed. Assessment Active Problems ICD-10 Non-pressure chronic ulcer of other part of right foot with fat layer exposed Venous insufficiency (chronic) (peripheral) Essential (primary) hypertension Plan Discharge From [facility] Services: Discharge from [place] Primary Wound Dressing: Other: - may keep top of right foot padded for protection Edema Control: Avoid standing for long periods of time Elevate legs to the level of the heart or above for 30 minutes daily and/or when sitting, a frequency of: - throughout the day. Off-Loading: Open toe surgical shoe to: - right foot Home Health: [place] [facility] - [facility] - may d/c skilled nursing - wound healed 1. The patient's wound is healed 2. Advised to keep foot wear from rubbing on this area for the next 3 or 4 weeks 3. She is having plantar right foot pain. Think this may be plantar fasciitis. I recommended insoles. Her peripheral pulses are palpable I do not believe this is an ischemic issue Electronic Signature(s) Signed: 09/22/2019 5:54:03 PM By: Linton Ham MD Entered By: Linton Ham on 09/22/2019 14:52:42 -------------------------------------------------------------------------------- SuperBill Details Patient Name: Date of  Service: PRERANA, JODOIN 09/22/2019 Medical Record [id number] Patient Account Number: [id number] Date of Birth/Sex: Treating RN: [birth year] (85 y.o. F) Donnal Debar,  [place] Primary Care Provider: Wilfred Lacy Other Clinician: Referring Provider: Treating Provider/Extender:Robson, Gae Bon, Trilby Drummer in Treatment: 11 Diagnosis Coding ICD-10 Codes Code Description [phone] Non-pressure chronic ulcer of other part of right foot with fat layer exposed I87.2 Venous insufficiency (chronic) (peripheral) I10 Essential (primary) hypertension Facility Procedures The patient participates with Medicare or their insurance follows the Medicare Facility Guidelines: CPT4 Code Description Modifier Quantity [id number] [place] VISIT-LEV 3 EST PT 1 Physician Procedures CPT4 Code Description: [id number] - WC PHYS LEVEL 2 - EST PT ICD-10 Diagnosis Description L97.512 Non-pressure chronic ulcer of other part of right foot I87.2 Venous insufficiency (chronic) (peripheral) Modifier: with fat layer e Quantity: 1 xposed Electronic Signature(s) Signed: 09/22/2019 5:54:03 PM By: Linton Ham MD Entered By: Linton Ham on 09/22/2019 14:53:46

## 2019-10-01 DIAGNOSIS — Z4800 Encounter for change or removal of nonsurgical wound dressing: Secondary | ICD-10-CM | POA: Diagnosis not present

## 2019-10-01 DIAGNOSIS — R609 Edema, unspecified: Secondary | ICD-10-CM | POA: Diagnosis not present

## 2019-10-01 DIAGNOSIS — L97512 Non-pressure chronic ulcer of other part of right foot with fat layer exposed: Secondary | ICD-10-CM | POA: Diagnosis not present

## 2019-10-01 DIAGNOSIS — I10 Essential (primary) hypertension: Secondary | ICD-10-CM | POA: Diagnosis not present

## 2019-10-01 DIAGNOSIS — I872 Venous insufficiency (chronic) (peripheral): Secondary | ICD-10-CM | POA: Diagnosis not present

## 2019-10-10 DIAGNOSIS — I872 Venous insufficiency (chronic) (peripheral): Secondary | ICD-10-CM | POA: Diagnosis not present

## 2019-10-10 DIAGNOSIS — Z4800 Encounter for change or removal of nonsurgical wound dressing: Secondary | ICD-10-CM | POA: Diagnosis not present

## 2019-10-10 DIAGNOSIS — R609 Edema, unspecified: Secondary | ICD-10-CM | POA: Diagnosis not present

## 2019-10-10 DIAGNOSIS — L97512 Non-pressure chronic ulcer of other part of right foot with fat layer exposed: Secondary | ICD-10-CM | POA: Diagnosis not present

## 2019-10-10 DIAGNOSIS — I10 Essential (primary) hypertension: Secondary | ICD-10-CM | POA: Diagnosis not present

## 2019-10-21 ENCOUNTER — Ambulatory Visit (INDEPENDENT_AMBULATORY_CARE_PROVIDER_SITE_OTHER): Payer: Medicare Other | Admitting: Podiatry

## 2019-10-21 ENCOUNTER — Other Ambulatory Visit: Payer: Self-pay

## 2019-10-21 ENCOUNTER — Ambulatory Visit (INDEPENDENT_AMBULATORY_CARE_PROVIDER_SITE_OTHER): Payer: Medicare Other

## 2019-10-21 DIAGNOSIS — S9031XA Contusion of right foot, initial encounter: Secondary | ICD-10-CM | POA: Diagnosis not present

## 2019-10-21 DIAGNOSIS — M898X9 Other specified disorders of bone, unspecified site: Secondary | ICD-10-CM

## 2019-10-21 NOTE — Progress Notes (Signed)
Subjective:  Patient ID: Susan [name], female    DOB: 07-Jun-1924,  MRN: [id number] HPI Chief Complaint  Patient presents with  . Foot Injury    Pt states right foot injury, dropped ironing board on her foot 3 months ago. Pt states generalized foot pain, most prominent location is right medial aspect. Pt states initial injury caused splitting of skin on dorsal aspect.    85 y.o. female presents with the above complaint.   ROS: Denies fever chills nausea vomiting muscle aches pains calf pain back pain chest pain shortness of breath.  Past Medical History:  Diagnosis Date  . Actinic keratosis, hx of    chest wall (2012)  . Allergic rhinitis   . Anxiety   . Arthritis    "spine, hips" (01/30/2017)  . Bursitis   . Cellulitis of right foot 01/30/2017  . Colon polyps   . Diverticulosis   . Esophageal dysmotilities 10/16/2017  . Fibromyalgia    "qwhere" (01/30/2017)  . Gallstones   . GERD (gastroesophageal reflux disease)   . High cholesterol   . History of kidney stones   . History of stomach ulcers   . Hx of squamous cell carcinoma excision    right mandible (2011), right arm (2014)  . Hypertension   . IBS (irritable bowel syndrome)   . Osteoarthritis   . Pneumonia 2000s X 2   "twice" (01/30/2017)  . Pyloric stenosis   . Stroke [facility]) 10/2014   denies residual on 01/30/2017   Past Surgical History:  Procedure Laterality Date  . APPENDECTOMY    . CARDIOVASCULAR STRESS TEST  04/2016   nuclear cardiolite stress test done in [place] (no ischemia, no LVH, no LV systolic function)  . CATARACT EXTRACTION W/ INTRAOCULAR LENS  IMPLANT, BILATERAL Bilateral   . CHOLECYSTECTOMY OPEN    . COLONOSCOPY  2009, 2014  . DIAGNOSTIC MAMMOGRAM  2009, 2012  . TONSILLECTOMY      Current Outpatient Medications:  .  acetaminophen (TYLENOL) 650 MG CR tablet, Take 650 mg by mouth every 8 (eight) hours as needed for pain., Disp: , Rfl:  .  ALPRAZolam (XANAX) 0.5 MG tablet, TAKE 1 TABLET BY  MOUTH ONCE DAILY AT BEDTIME AS NEEDED FOR ANXIETY, Disp: 30 tablet, Rfl: 1 .  amLODipine (NORVASC) 5 MG tablet, Take 1 tablet (5 mg total) by mouth at bedtime., Disp: 90 tablet, Rfl: 3 .  aspirin EC 325 MG tablet, Take 325 mg by mouth daily., Disp: , Rfl:  .  cholestyramine (QUESTRAN) 4 g packet, Take 1 packet (4 g total) by mouth daily., Disp: 90 each, Rfl: 3 .  dicyclomine (BENTYL) 10 MG capsule, Take 1 capsule (10 mg total) by mouth 2 (two) times daily., Disp: 180 capsule, Rfl: 1 .  famotidine (PEPCID) 20 MG tablet, Take 1 tablet (20 mg total) by mouth daily., Disp: 90 tablet, Rfl: 1 .  fexofenadine (ALLEGRA) 180 MG tablet, Take 180 mg by mouth daily., Disp: , Rfl:  .  furosemide (LASIX) 20 MG tablet, Take 1 tablet (20 mg total) by mouth daily., Disp: 90 tablet, Rfl: 1 .  IRON PO, Take 65 mg by mouth 3 (three) times a week., Disp: , Rfl:  .  loperamide (IMODIUM) 2 MG capsule, Take by mouth as needed for diarrhea or loose stools., Disp: , Rfl:  .  losartan (COZAAR) 100 MG tablet, Take 1 tablet (100 mg total) by mouth daily., Disp: 90 tablet, Rfl: 1 .  metoprolol succinate (TOPROL-XL) 25 MG 24 hr tablet,  Take 1 tablet (25 mg total) by mouth daily., Disp: 90 tablet, Rfl: 3 .  potassium chloride SA (K-DUR) 20 MEQ tablet, Take 1 tablet (20 mEq total) by mouth daily., Disp: 90 tablet, Rfl: 1 .  Vitamin D, Ergocalciferol, (DRISDOL) 1.25 MG (50000 UT) CAPS capsule, Take 1 capsule by mouth once a week, Disp: 12 capsule, Rfl: 0 .  atorvastatin (LIPITOR) 10 MG tablet, Take 1 tablet (10 mg total) by mouth daily. (Patient not taking: Reported on 10/21/2019), Disp: 90 tablet, Rfl: 1 .  naproxen (NAPROSYN) 500 MG tablet, Take 500 mg by mouth 2 (two) times daily., Disp: , Rfl:   Allergies  Allergen Reactions  . Butrans [Buprenorphine] Other (See Comments)    Severe chest pains  . Codeine Other (See Comments)    Severe Chest pains  . Morphine And Related Other (See Comments)    Severe chest pains    Review of Systems Objective:  There were no vitals filed for this visit.  General: Well developed, nourished, in no acute distress, alert and oriented x3   Dermatological: Skin is warm, dry and supple bilateral. Nails x 10 are well maintained; remaining integument appears unremarkable at this time. There are no open sores, no preulcerative lesions, no rash or signs of infection present.  Vascular: Dorsalis Pedis artery and Posterior Tibial artery pedal pulses are 2/4 bilateral with immedate capillary fill time. Pedal hair growth present. No varicosities and no lower extremity edema present bilateral.   Neruologic: Grossly intact via light touch bilateral. Vibratory intact via tuning fork bilateral. Protective threshold with Semmes Wienstein monofilament intact to all pedal sites bilateral. Patellar and Achilles deep tendon reflexes 2+ bilateral. No Babinski or clonus noted bilateral.   Musculoskeletal: No gross boney pedal deformities bilateral. No pain, crepitus, or limitation noted with foot and ankle range of motion bilateral. Muscular strength 5/5 in all groups tested bilateral.  Gait: Unassisted, Nonantalgic.    Radiographs:  Radiographs taken today demonstrate prominent navicular tuberosity otherwise no fractures.  Assessment & Plan:   Assessment: Exostosis navicular tuberosity right foot.  Plan: Placed padding discussed different options with the patient including surgery she does not want to go through that at this point I will follow up with her as needed.     Laurice Iglesia T. [place], [place]

## 2019-11-03 ENCOUNTER — Other Ambulatory Visit: Payer: Self-pay | Admitting: Nurse Practitioner

## 2019-11-03 DIAGNOSIS — F418 Other specified anxiety disorders: Secondary | ICD-10-CM

## 2019-11-05 ENCOUNTER — Ambulatory Visit (INDEPENDENT_AMBULATORY_CARE_PROVIDER_SITE_OTHER): Payer: Medicare Other | Admitting: Family Medicine

## 2019-11-05 ENCOUNTER — Other Ambulatory Visit: Payer: Self-pay

## 2019-11-05 ENCOUNTER — Encounter: Payer: Self-pay | Admitting: Family Medicine

## 2019-11-05 ENCOUNTER — Ambulatory Visit: Payer: Self-pay

## 2019-11-05 VITALS — BP 130/80 | HR 80 | Ht 61.0 in | Wt 112.0 lb

## 2019-11-05 DIAGNOSIS — M25551 Pain in right hip: Secondary | ICD-10-CM | POA: Diagnosis not present

## 2019-11-05 DIAGNOSIS — M25552 Pain in left hip: Secondary | ICD-10-CM | POA: Diagnosis not present

## 2019-11-05 DIAGNOSIS — M7061 Trochanteric bursitis, right hip: Secondary | ICD-10-CM | POA: Diagnosis not present

## 2019-11-05 DIAGNOSIS — M76821 Posterior tibial tendinitis, right leg: Secondary | ICD-10-CM | POA: Diagnosis not present

## 2019-11-05 DIAGNOSIS — M7062 Trochanteric bursitis, left hip: Secondary | ICD-10-CM

## 2019-11-05 NOTE — Assessment & Plan Note (Signed)
Patient given injection today, tolerated procedure well, discussed icing regimen and home exercise, which activities to do which wants to avoid.  Patient will increase activity slowly over the course the next several weeks.  Follow-up again 4 to 8 weeks

## 2019-11-05 NOTE — Patient Instructions (Addendum)
Happy Holidays Injected hips today Injected foot as well See me again in 8 weeks    [address], [address] floor [place], [place] [address] Phone [phone]

## 2019-11-05 NOTE — Assessment & Plan Note (Addendum)
Bilateral injections given today.  Tolerated the procedure well.  Discussed icing regimen and home exercise, discussed topical anti-inflammatories.  Patient will increase activity slowly.  Follow-up again in 48 weeks Discussed that patient continues to have atrophy in the area.  Patient does states that this is the only thing that gives her any type of relief.  Patient does not want to take any other medications and feels like she is on too many already.  I am also concerned because any medicine that would help her with pain could be somewhat detrimental to her balance and cause falls.  Patient will see me as stated above in 8 weeks

## 2019-11-05 NOTE — Progress Notes (Signed)
[name] [place], [place] [address] Phone: [phone] Subjective:   [name], [name]   This visit occurred during the SARS-CoV-2 public health emergency.  Safety protocols were in place, including screening questions prior to the visit, additional usage of staff PPE, and extensive cleaning of exam room while observing appropriate contact time as indicated for disinfecting solutions.     CC: Bilateral hip, foot pain,  [id number]   08/08/2019 Repeat injections given.  Discussed icing regimen and home exercise, discussed which activities to do which wants to avoid.  Increase activity slowly over the course the next several weeks.  Can repeat injections every 8 weeks.  Due to patient's age and comorbidities and medications unfortunately not a lot of other modalities.  Update 11/05/2019 Susan [name] is a 85 y.o. female coming in with complaint of bilateral hip pain. Patient is here for injection in the greater trochanter.   Continues to have pain in right foot for the past 4 months. Is unable to wear shoe.  Patient has had this for quite some time.  Seen in the providers.  Has had more recent x-rays that did not show any type of fracture.  Patient feels like the pain is more of the ankle sometimes.  Continues to wear a postop boot on a regular basis.    Past Medical History:  Diagnosis Date  . Actinic keratosis, hx of    chest wall (2012)  . Allergic rhinitis   . Anxiety   . Arthritis    "spine, hips" (01/30/2017)  . Bursitis   . Cellulitis of right foot 01/30/2017  . Colon polyps   . Diverticulosis   . Esophageal dysmotilities 10/16/2017  . Fibromyalgia    "qwhere" (01/30/2017)  . Gallstones   . GERD (gastroesophageal reflux disease)   . High cholesterol   . History of kidney stones   . History of stomach ulcers   . Hx of squamous cell carcinoma excision    right mandible (2011),  right arm (2014)  . Hypertension   . IBS (irritable bowel syndrome)   . Osteoarthritis   . Pneumonia 2000s X 2   "twice" (01/30/2017)  . Pyloric stenosis   . Stroke [facility]) 10/2014   denies residual on 01/30/2017   Past Surgical History:  Procedure Laterality Date  . APPENDECTOMY    . CARDIOVASCULAR STRESS TEST  04/2016   nuclear cardiolite stress test done in [place] (no ischemia, no LVH, no LV systolic function)  . CATARACT EXTRACTION W/ INTRAOCULAR LENS  IMPLANT, BILATERAL Bilateral   . CHOLECYSTECTOMY OPEN    . COLONOSCOPY  2009, 2014  . DIAGNOSTIC MAMMOGRAM  2009, 2012  . TONSILLECTOMY     Social History   Socioeconomic History  . Marital status: Widowed    Spouse name: Not on file  . Number of children: 4  . Years of education: Not on file  . Highest education level: Not on file  Occupational History  . Occupation: retired  Scientific laboratory technician  . Financial resource strain: Not on file  . Food insecurity    Worry: Not on file    Inability: Not on file  . Transportation needs    Medical: Not on file    Non-medical: Not on file  Tobacco Use  . Smoking status: Never Smoker  . Smokeless tobacco: Never Used  Substance and Sexual Activity  . Alcohol use: Yes  Comment: glass of red wine maybe once week  . Drug use: No  . Sexual activity: Not on file  Lifestyle  . Physical activity    Days per week: Not on file    Minutes per session: Not on file  . Stress: Not on file  Relationships  . Social Herbalist on phone: Not on file    Gets together: Not on file    Attends religious service: Not on file    Active member of club or organization: Not on file    Attends meetings of clubs or organizations: Not on file    Relationship status: Not on file  Other Topics Concern  . Not on file  Social History Narrative  . Not on file   Allergies  Allergen Reactions  . Butrans [Buprenorphine] Other (See Comments)    Severe chest pains  . Codeine Other (See  Comments)    Severe Chest pains  . Morphine And Related Other (See Comments)    Severe chest pains   Family History  Problem Relation Age of Onset  . Stroke Mother   . Heart attack Mother   . Stroke Father   . Heart attack Father   . Heart attack Maternal Grandmother   . Stroke Maternal Grandmother   . Stroke Paternal Grandmother   . Heart attack Paternal Grandmother   . Heart attack Maternal Grandfather   . Stroke Maternal Grandfather   . Stroke Paternal Grandfather   . Heart attack Paternal Grandfather   . Colon cancer Neg Hx   . Pancreatic cancer Neg Hx   . Stomach cancer Neg Hx   . Esophageal cancer Neg Hx      Current Outpatient Medications (Cardiovascular):  .  amLODipine (NORVASC) 5 MG tablet, Take 1 tablet (5 mg total) by mouth at bedtime. Marland Kitchen  atorvastatin (LIPITOR) 10 MG tablet, Take 1 tablet (10 mg total) by mouth daily. (Patient not taking: Reported on 10/21/2019) .  cholestyramine (QUESTRAN) 4 g packet, Take 1 packet (4 g total) by mouth daily. .  furosemide (LASIX) 20 MG tablet, Take 1 tablet (20 mg total) by mouth daily. Marland Kitchen  losartan (COZAAR) 100 MG tablet, Take 1 tablet (100 mg total) by mouth daily. .  metoprolol succinate (TOPROL-XL) 25 MG 24 hr tablet, Take 1 tablet (25 mg total) by mouth daily.  Current Outpatient Medications (Respiratory):  .  fexofenadine (ALLEGRA) 180 MG tablet, Take 180 mg by mouth daily.  Current Outpatient Medications (Analgesics):  .  acetaminophen (TYLENOL) 650 MG CR tablet, Take 650 mg by mouth every 8 (eight) hours as needed for pain. Marland Kitchen  aspirin EC 325 MG tablet, Take 325 mg by mouth daily. .  naproxen (NAPROSYN) 500 MG tablet, Take 500 mg by mouth 2 (two) times daily.  Current Outpatient Medications (Hematological):  Marland Kitchen  IRON PO, Take 65 mg by mouth 3 (three) times a week.  Current Outpatient Medications (Other):  Marland Kitchen  ALPRAZolam (XANAX) 0.5 MG tablet, TAKE 1 TABLET BY MOUTH ONCE DAILY AT BEDTIME AS NEEDED FOR ANXIETY .   dicyclomine (BENTYL) 10 MG capsule, Take 1 capsule (10 mg total) by mouth 2 (two) times daily. .  famotidine (PEPCID) 20 MG tablet, Take 1 tablet (20 mg total) by mouth daily. Marland Kitchen  loperamide (IMODIUM) 2 MG capsule, Take by mouth as needed for diarrhea or loose stools. .  potassium chloride SA (K-DUR) 20 MEQ tablet, Take 1 tablet (20 mEq total) by mouth daily. .  Vitamin  D, Ergocalciferol, (DRISDOL) 1.25 MG (50000 UT) CAPS capsule, Take 1 capsule by mouth once a week    Past medical history, social, surgical and family history all reviewed in electronic medical record.  No pertanent information unless stated regarding to the chief complaint.   Review of Systems:  No headache, visual changes, nausea, vomiting, diarrhea, constipation, dizziness, abdominal pain, skin rash, fevers, chills, night sweats, weight loss, swollen lymph nodes, body aches, joint swelling,, chest pain, shortness of breath, mood changes.  Positive muscle aches  Objective  Blood pressure 130/80, pulse 80, height 5\' 1"  (1.549 m), weight 112 lb (50.8 kg), SpO2 97 %.   General: No apparent distress alert and oriented x3 mood and affect normal, dressed appropriately.  HEENT: Pupils equal, extraocular movements intact  Respiratory: Patient's speak in full sentences and does not appear short of breath  Cardiovascular: No lower extremity edema, non tender, no erythema  Skin: Warm dry intact with no signs of infection or rash on extremities or on axial skeleton.  Abdomen: Soft nontender  Neuro: Cranial nerves II through XII are intact, neurovascularly intact in all extremities with 2+ DTRs and 1+ pulses.  Lymph: No lymphadenopathy of posterior or anterior cervical chain or axillae bilaterally.  Gait antalgic with a walker   MSK:  tender with limited range of motion and stability and symmetric strength and tone of shoulders, elbows, wrist, , knee and ankles bilaterally.  Severe arthritis noted of multiple joints    Hip exam  bilateral atrophy of soft tissue over GT but does have inflammation ltd ROM in all plain  Foot exam pain over the medial PTT not over the navicular pain. Foot arthritis mid foot patient does have some mild discoloration of the toes likely secondary to some mild poor blood flow   Procedure: Real-time Ultrasound Guided Injection of right greater trochanteric bursitis secondary to patient's body habitus Device: GE Logiq Q7 Ultrasound guided injection is preferred based studies that show increased duration, increased effect, greater accuracy, decreased procedural pain, increased response rate, and decreased cost with ultrasound guided versus blind injection.  Verbal informed consent obtained.  Time-out conducted.  Noted no overlying erythema, induration, or other signs of local infection.  Skin prepped in a sterile fashion.  Local anesthesia: Topical Ethyl chloride.  With sterile technique and under real time ultrasound guidance:  Greater trochanteric area was visualized and patient's bursa was noted. A 22-gauge 3 inch needle was inserted and 4 cc of 0.5% Marcaine and 1 cc of Kenalog 40 mg/dL was injected. Pictures taken Completed without difficulty  Pain immediately resolved suggesting accurate placement of the medication.  Advised to call if fevers/chills, erythema, induration, drainage, or persistent bleeding.  Images permanently stored and available for review in the ultrasound unit.  Impression: Technically successful ultrasound guided injection.   Procedure: Real-time Ultrasound Guided Injection of left  greater trochanteric bursitis secondary to patient's body habitus Device: GE Logiq Q7  Ultrasound guided injection is preferred based studies that show increased duration, increased effect, greater accuracy, decreased procedural pain, increased response rate, and decreased cost with ultrasound guided versus blind injection.  Verbal informed consent obtained.  Time-out conducted.  Noted no  overlying erythema, induration, or other signs of local infection.  Skin prepped in a sterile fashion.  Local anesthesia: Topical Ethyl chloride.  With sterile technique and under real time ultrasound guidance:  Greater trochanteric area was visualized and patient's bursa was noted. A 22-gauge 3 inch needle was inserted and 4 cc of 0.5% Marcaine  and 1 cc of Kenalog 40 mg/dL was injected. Pictures taken Completed without difficulty  Pain immediately resolved suggesting accurate placement of the medication.  Advised to call if fevers/chills, erythema, induration, drainage, or persistent bleeding.  Images permanently stored and available for review in the ultrasound unit.  Impression: Technically successful ultrasound guided injection.  Procedure: Real-time Ultrasound Guided Injection of right posterior tibialis tendon Device: GE Logiq Q7 Ultrasound guided injection is preferred based studies that show increased duration, increased effect, greater accuracy, decreased procedural pain, increased response rate, and decreased cost with ultrasound guided versus blind injection.  Verbal informed consent obtained.  Time-out conducted.  Noted no overlying erythema, induration, or other signs of local infection.  Skin prepped in a sterile fashion.  Local anesthesia: Topical Ethyl chloride.  With sterile technique and under real time ultrasound guidance: With a 25-gauge half inch needle injected with 0.5 cc of 0.5% Marcaine and 0.5 cc of Kenalog 40 mg/mL Completed without difficulty  Pain immediately resolved suggesting accurate placement of the medication.  Advised to call if fevers/chills, erythema, induration, drainage, or persistent bleeding.  Images permanently stored and available for review in the ultrasound unit.  Impression: Technically successful ultrasound guided injection.   Impression and Recommendations:     This case required medical decision making of moderate complexity. The above  documentation has been reviewed and is accurate and complete Lyndal Pulley, DO       Note: This dictation was prepared with Dragon dictation along with smaller phrase technology. Any transcriptional errors that result from this process are unintentional.

## 2019-11-11 NOTE — Progress Notes (Signed)
Susan [name], Susan [name] ([id number]) Visit Report for 09/22/2019 Arrival Information Details Patient Name: Date of Service: Susan [name], Susan [name] 09/22/2019 12:30 PM Medical Record [id number] Patient Account Number: [id number] Date of Birth/Sex: Treating RN: 10-11-1924 (85 y.o. Nancy Fetter Primary Care Marynell Bies: Wilfred Lacy Other Clinician: Referring Anaira Seay: Treating Maxie Debose/Extender:Robson, Gae Bon, Trilby Drummer in Treatment: 11 Visit Information History Since Last Visit Added or deleted any medications: No Patient Arrived: Wheel Chair Any new allergies or adverse reactions: No Arrival Time: 13:02 Had a fall or experienced change in No activities of daily living that may affect Accompanied By: daughter risk of falls: Transfer Assistance: None Signs or symptoms of abuse/neglect since last No Patient Identification Verified: Yes visito Secondary Verification Process Completed: Yes Hospitalized since last visit: No Patient Requires Transmission-Based No Implantable device outside of the clinic excluding No Precautions: cellular tissue based products placed in the center Patient Has Alerts: No since last visit: Has Dressing in Place as Prescribed: Yes Pain Present Now: Yes Electronic Signature(s) Signed: 11/11/2019 3:02:15 PM By: Sandre Kitty Entered By: Sandre Kitty on 09/22/2019 13:03:39 -------------------------------------------------------------------------------- Clinic Level of Care Assessment Details Patient Name: Date of Service: Susan [name], Susan [name] 09/22/2019 12:30 PM Medical Record [id number] Patient Account Number: [id number] Date of Birth/Sex: Treating RN: 1924/03/29 (85 y.o. Nancy Fetter Primary Care Bryant Lipps: Wilfred Lacy Other Clinician: Referring Kaylanni Ezelle: Treating Jenella Craigie/Extender:Robson, Gae Bon, Trilby Drummer in Treatment: 11 Clinic Level of Care Assessment Items TOOL 4 Quantity Score X - Use when only an EandM  is performed on FOLLOW-UP visit 1 0 ASSESSMENTS - Nursing Assessment / Reassessment X - Reassessment of Co-morbidities (includes updates in patient status) 1 10 X - Reassessment of Adherence to Treatment Plan 1 5 ASSESSMENTS - Wound and Skin Assessment / Reassessment X - Simple Wound Assessment / Reassessment - one wound 1 5 []  - Complex Wound Assessment / Reassessment - multiple wounds 0 []  - Dermatologic / Skin Assessment (not related to wound area) 0 ASSESSMENTS - Focused Assessment []  - Circumferential Edema Measurements - multi extremities 0 []  - Nutritional Assessment / Counseling / Intervention 0 X - Lower Extremity Assessment (monofilament, tuning fork, pulses) 1 5 []  - Peripheral Arterial Disease Assessment (using hand held doppler) 0 ASSESSMENTS - Ostomy and/or Continence Assessment and Care []  - Incontinence Assessment and Management 0 []  - Ostomy Care Assessment and Management (repouching, etc.) 0 PROCESS - Coordination of Care X - Simple Patient / Family Education for ongoing care 1 15 []  - Complex (extensive) Patient / Family Education for ongoing care 0 X - Staff obtains Programmer, systems, Records, Test Results / Process Orders 1 10 X - Staff telephones HHA, Nursing Homes / Clarify orders / etc 1 10 []  - Routine Transfer to another Facility (non-emergent condition) 0 []  - Routine Hospital Admission (non-emergent condition) 0 []  - New Admissions / Biomedical engineer / Ordering NPWT, Apligraf, etc. 0 []  - Emergency Hospital Admission (emergent condition) 0 X - Simple Discharge Coordination 1 10 []  - Complex (extensive) Discharge Coordination 0 PROCESS - Special Needs []  - Pediatric / Minor Patient Management 0 []  - Isolation Patient Management 0 []  - Hearing / Language / Visual special needs 0 []  - Assessment of Community assistance (transportation, D/C planning, etc.) 0 []  - Additional assistance / Altered mentation 0 []  - Support Surface(s) Assessment (bed, cushion, seat,  etc.) 0 INTERVENTIONS - Wound Cleansing / Measurement X - Simple Wound Cleansing - one wound 1 5 []  - Complex Wound Cleansing - multiple wounds 0 X - Wound Imaging (photographs -  any number of wounds) 1 5 []  - Wound Tracing (instead of photographs) 0 X - Simple Wound Measurement - one wound 1 5 []  - Complex Wound Measurement - multiple wounds 0 INTERVENTIONS - Wound Dressings []  - Small Wound Dressing one or multiple wounds 0 []  - Medium Wound Dressing one or multiple wounds 0 []  - Large Wound Dressing one or multiple wounds 0 []  - Application of Medications - topical 0 []  - Application of Medications - injection 0 INTERVENTIONS - Miscellaneous []  - External ear exam 0 []  - Specimen Collection (cultures, biopsies, blood, body fluids, etc.) 0 []  - Specimen(s) / Culture(s) sent or taken to Lab for analysis 0 []  - Patient Transfer (multiple staff / Civil Service fast streamer / Similar devices) 0 []  - Simple Staple / Suture removal (25 or less) 0 []  - Complex Staple / Suture removal (26 or more) 0 []  - Hypo / Hyperglycemic Management (close monitor of Blood Glucose) 0 []  - Ankle / Brachial Index (ABI) - do not check if billed separately 0 X - Vital Signs 1 5 Has the patient been seen at the hospital within the last three years: Yes Total Score: 90 Level Of Care: New/Established - Level 3 Electronic Signature(s) Signed: 09/24/2019 6:50:57 PM By: Levan Hurst RN, BSN Entered By: Levan Hurst on 09/22/2019 13:57:22 -------------------------------------------------------------------------------- Encounter Discharge Information Details Patient Name: Date of Service: Susan [name], Susan [name] 09/22/2019 12:30 PM Medical Record [id number] Patient Account Number: [id number] Date of Birth/Sex: Treating RN: [birth year] (85 y.o. Nancy Fetter Primary Care  [place]: Wilfred Lacy Other Clinician: Referring Ambriana Selway: Treating Astella Desir/Extender:Robson, Gae Bon, Trilby Drummer in Treatment:  11 Encounter Discharge Information Items Discharge Condition: Stable Ambulatory Status: Ambulatory Discharge Destination: Home Transportation: Private Auto Accompanied By: daughter Schedule Follow-up Appointment: Yes Clinical Summary of Care: Patient Declined Electronic Signature(s) Signed: 09/24/2019 6:50:57 PM By: Levan Hurst RN, BSN Entered By: Levan Hurst on 09/22/2019 18:47:31 -------------------------------------------------------------------------------- Lower Extremity Assessment Details Patient Name: Date of Service: Susan [name], Susan [name] 09/22/2019 12:30 PM Medical Record [id number] Patient Account Number: [id number] Date of Birth/Sex: Treating RN: [birth year] (85 y.o. Nancy Fetter Primary Care Terree Gaultney: Wilfred Lacy Other Clinician: Referring Sherrod Toothman: Treating Shahmeer Bunn/Extender:Robson, Gae Bon, Trilby Drummer in Treatment: 11 Edema Assessment Assessed: [Left: No] [Right: No] Edema: [Left: N] [Right: o] Calf Left: Right: Point of Measurement: 36 cm From Medial Instep cm 24.5 cm Ankle Left: Right: Point of Measurement: 10 cm From Medial Instep cm 18 cm Vascular Assessment Pulses: Dorsalis Pedis Palpable: [Right:Yes] Electronic Signature(s) Signed: 09/24/2019 6:50:57 PM By: Levan Hurst RN, BSN Entered By: Levan Hurst on 09/22/2019 13:53:48 -------------------------------------------------------------------------------- Multi Wound Chart Details Patient Name: Date of Service: Susan [name], Susan [name] 09/22/2019 12:30 PM Medical Record [id number] Patient Account Number: [id number] Date of Birth/Sex: Treating RN: 1924-11-04 (85 y.o. Nancy Fetter Primary Care Chesney Klimaszewski: Wilfred Lacy Other Clinician: Referring Lauris Keepers: Treating Gray Doering/Extender:Robson, Gae Bon, Trilby Drummer in Treatment: 11 Vital Signs Height(in): 61 Pulse(bpm): 55 Weight(lbs): 109 Blood Pressure(mmHg): 143/63 Body Mass Index(BMI): 21 Temperature(F):  98.7 Respiratory 16 Rate(breaths/min): Photos: [5:No Photos] [N/A:N/A] Wound Location: [5:Right Foot - Dorsal] [N/A:N/A] Wounding Event: [5:Trauma] [N/A:N/A] Primary Etiology: [5:Trauma, Other] [N/A:N/A] Date Acquired: [5:06/26/2019] [N/A:N/A] Weeks of Treatment: [5:11] [N/A:N/A] Wound Status: [5:Healed - Epithelialized] [N/A:N/A] Measurements L x W x D [5:0x0x0] [N/A:N/A] (cm) Area (cm) : [5:0] [N/A:N/A] Volume (cm) : [5:0] [N/A:N/A] % Reduction in Area: [5:100.00%] [N/A:N/A] % Reduction in Volume: [5:100.00%] [N/A:N/A] Classification: [5:Full Thickness Without Exposed Support Structures] [N/A:N/A] Exudate Amount: [5:None Present] [N/A:N/A] Wound Margin: [5:Flat and Intact] [N/A:N/A] Granulation Amount: [  5:None Present (0%)] [N/A:N/A] Necrotic Amount: [5:None Present (0%)] [N/A:N/A] Exposed Structures: [5:Fascia: No Fat Layer (Subcutaneous Tissue) Exposed: No Tendon: No Muscle: No Joint: No Bone: No] [N/A:N/A] Epithelialization: [5:Large (67-100%)] [N/A:N/A] Electronic Signature(s) Signed: 09/22/2019 5:54:03 PM By: Linton Ham MD Signed: 09/24/2019 6:50:57 PM By: Levan Hurst RN, BSN Entered By: Linton Ham on 09/22/2019 14:49:46 -------------------------------------------------------------------------------- Multi-Disciplinary Care Plan Details Patient Name: Date of Service: Susan [name], Susan [name] 09/22/2019 12:30 PM Medical Record [id number] Patient Account Number: [id number] Date of Birth/Sex: Treating RN: 1924-02-12 (85 y.o. Nancy Fetter Primary Care Kristalyn Bergstresser: Wilfred Lacy Other Clinician: Referring Roosevelt Eimers: Treating Zacary Bauer/Extender:Robson, Gae Bon, Trilby Drummer in Treatment: 11 Active Inactive Electronic Signature(s) Signed: 09/24/2019 6:50:57 PM By: Levan Hurst RN, BSN Entered By: Levan Hurst on 09/22/2019 13:56:23 -------------------------------------------------------------------------------- Pain Assessment Details Patient  Name: Date of Service: Susan [name], Susan [name] 09/22/2019 12:30 PM Medical Record [id number] Patient Account Number: [id number] Date of Birth/Sex: Treating RN: Apr 15, 1924 (85 y.o. Nancy Fetter Primary Care Saraann Enneking: Wilfred Lacy Other Clinician: Referring Lashaye Fisk: Treating Janard Culp/Extender:Robson, Gae Bon, Trilby Drummer in Treatment: 11 Active Problems Location of Pain Severity and Description of Pain Patient Has Paino Yes Site Locations Rate the pain. Current Pain Level: 4 Pain Management and Medication Current Pain Management: Electronic Signature(s) Signed: 09/24/2019 6:50:57 PM By: Levan Hurst RN, BSN Signed: 11/11/2019 3:02:15 PM By: Sandre Kitty Entered By: Sandre Kitty on 09/22/2019 13:04:38 -------------------------------------------------------------------------------- Patient/Caregiver Education Details Patient Name: Stacie Acres 10/19/2020andnbsp12:30 Date of Service: PM Medical Record [id number] Number: Patient Account Number: [id number] Treating RN: Oct 13, 1924 (85 y.o. Levan Hurst Date of Birth/Gender: F) Other Clinician: Primary Care Physician: Wells Guiles Referring Physician: Physician/Extender: Varney Baas in Treatment: 11 Education Assessment Education Provided To: Patient Education Topics Provided Wound/Skin Impairment: Methods: Explain/Verbal Responses: State content correctly [employer]) Signed: 09/24/2019 6:50:57 PM By: Levan Hurst RN, BSN Entered By: Levan Hurst on 09/22/2019 13:56:57 -------------------------------------------------------------------------------- Wound Assessment Details Patient Name: Date of Service: Susan [name], Susan [name] 09/22/2019 12:30 PM Medical Record [id number] Patient Account Number: [id number] Date of Birth/Sex: Treating RN: 1924/06/29 (85 y.o. Nancy Fetter Primary Care Cherly Erno: Wilfred Lacy Other Clinician: Referring  Kensly Bowmer: Treating Azrael Maddix/Extender:Robson, Gae Bon, Trilby Drummer in Treatment: 11 Wound Status Wound Number: 5 Primary Etiology: Trauma, Other Wound Location: Right Foot - Dorsal Wound Status: Healed - Epithelialized Wounding Event: Trauma Date Acquired: 06/26/2019 Weeks Of Treatment: 11 Clustered Wound: No Photos Wound Measurements Length: (cm) 0 % Reduct Width: (cm) 0 % Reduct Depth: (cm) 0 Epitheli Area: (cm) 0 Tunneli Volume: (cm) 0 Undermi Wound Description Classification: Full Thickness Without Exposed Support Structures Wound Flat and Intact Margin: Exudate None Present Amount: Wound Bed Granulation Amount: None Present (0%) Necrotic Amount: None Present (0%) Foul Odor After Cleansing: No Slough/Fibrino No Exposed Structure Fascia Exposed: No Fat Layer (Subcutaneous Tissue) Exposed: No Tendon Exposed: No Muscle Exposed: No Joint Exposed: No Bone Exposed: No ion in Area: 100% ion in Volume: 100% alization: Large (67-100%) ng: No ning: No Electronic Signature(s) Signed: 09/24/2019 6:50:57 PM By: Levan Hurst RN, BSN Signed: 09/25/2019 4:25:07 PM By: Mikeal Hawthorne EMT/HBOT Entered By: Mikeal Hawthorne on 09/24/2019 11:34:50 -------------------------------------------------------------------------------- Vitals Details Patient Name: Date of Service: Susan [name], Susan [name] 09/22/2019 12:30 PM Medical Record [id number] Patient Account Number: [id number] Date of Birth/Sex: Treating RN: [birth year] (85 y.o. Nancy Fetter Primary Care Reedy Biernat: Wilfred Lacy Other Clinician: Referring Mashal Slavick: Treating Lilyrose Tanney/Extender:Robson, Gae Bon, Trilby Drummer in Treatment: 11 Vital Signs Time Taken: 13:06 Temperature (F): 98.7 Height (in): 61 Pulse (bpm): 55 Weight (lbs): 109 Respiratory Rate (  breaths/min): 16 Body Mass Index (BMI): 20.6 Blood Pressure (mmHg): 143/63 Reference Range: 80 - 120 mg / dl Electronic Signature(s) Signed:  11/11/2019 3:02:15 PM By: Sandre Kitty Entered By: Sandre Kitty on 09/22/2019 13:04:30

## 2019-11-12 ENCOUNTER — Ambulatory Visit: Payer: Medicare Other | Admitting: Nurse Practitioner

## 2019-12-02 ENCOUNTER — Other Ambulatory Visit: Payer: Self-pay | Admitting: Family Medicine

## 2019-12-02 ENCOUNTER — Other Ambulatory Visit: Payer: Self-pay | Admitting: Nurse Practitioner

## 2019-12-02 DIAGNOSIS — F418 Other specified anxiety disorders: Secondary | ICD-10-CM

## 2019-12-02 DIAGNOSIS — K529 Noninfective gastroenteritis and colitis, unspecified: Secondary | ICD-10-CM

## 2019-12-02 NOTE — Telephone Encounter (Signed)
Charlotte please advise, last refill for Xanax was 11/03/2019--pt schedule f/u with [facility] on 12/31/2019.  MPM check, 3 pick up for 30 tab at [facility]: 11/03/19 10/02/19 09/03/19

## 2019-12-03 ENCOUNTER — Ambulatory Visit: Payer: Medicare Other | Admitting: Nurse Practitioner

## 2019-12-22 DIAGNOSIS — Z23 Encounter for immunization: Secondary | ICD-10-CM | POA: Diagnosis not present

## 2019-12-30 ENCOUNTER — Other Ambulatory Visit: Payer: Self-pay | Admitting: Nurse Practitioner

## 2019-12-30 ENCOUNTER — Other Ambulatory Visit: Payer: Self-pay

## 2019-12-30 ENCOUNTER — Other Ambulatory Visit: Payer: Self-pay | Admitting: Internal Medicine

## 2019-12-30 DIAGNOSIS — F418 Other specified anxiety disorders: Secondary | ICD-10-CM

## 2019-12-30 NOTE — Telephone Encounter (Signed)
Pt no longer see Dr. Jenny Reichmann. Pt see [facility] @ [place]. Forwarding msg to her for renewal../lmb

## 2019-12-30 NOTE — Telephone Encounter (Signed)
Charlotte please advise, we do have not send in this rx for the pt.

## 2019-12-31 ENCOUNTER — Encounter: Payer: Self-pay | Admitting: Nurse Practitioner

## 2019-12-31 ENCOUNTER — Ambulatory Visit (INDEPENDENT_AMBULATORY_CARE_PROVIDER_SITE_OTHER): Payer: Medicare Other | Admitting: Nurse Practitioner

## 2019-12-31 VITALS — BP 110/60 | HR 78 | Temp 97.1°F | Wt 109.4 lb

## 2019-12-31 DIAGNOSIS — I872 Venous insufficiency (chronic) (peripheral): Secondary | ICD-10-CM | POA: Diagnosis not present

## 2019-12-31 DIAGNOSIS — D649 Anemia, unspecified: Secondary | ICD-10-CM

## 2019-12-31 DIAGNOSIS — K2100 Gastro-esophageal reflux disease with esophagitis, without bleeding: Secondary | ICD-10-CM | POA: Diagnosis not present

## 2019-12-31 DIAGNOSIS — Z8673 Personal history of transient ischemic attack (TIA), and cerebral infarction without residual deficits: Secondary | ICD-10-CM

## 2019-12-31 DIAGNOSIS — I10 Essential (primary) hypertension: Secondary | ICD-10-CM

## 2019-12-31 DIAGNOSIS — R4701 Aphasia: Secondary | ICD-10-CM | POA: Diagnosis not present

## 2019-12-31 LAB — BASIC METABOLIC PANEL
BUN: 15 mg/dL (ref 6–23)
CO2: 28 mEq/L (ref 19–32)
Calcium: 9.1 mg/dL (ref 8.4–10.5)
Chloride: 107 mEq/L (ref 96–112)
Creatinine, Ser: 0.56 mg/dL (ref 0.40–1.20)
GFR: 101 mL/min (ref >60.00)
Glucose, Bld: 95 mg/dL (ref 70–99)
Potassium: 4.8 mEq/L (ref 3.5–5.1)
Sodium: 141 mEq/L (ref 135–145)

## 2019-12-31 MED ORDER — POTASSIUM CHLORIDE CRYS ER 20 MEQ PO TBCR: 20.0000 meq | EXTENDED_RELEASE_TABLET | Freq: Every day | ORAL | 1 refills | Status: DC

## 2019-12-31 MED ORDER — FAMOTIDINE 20 MG PO TABS: 20.0000 mg | ORAL_TABLET | Freq: Every day | ORAL | 3 refills | Status: DC

## 2019-12-31 MED ORDER — LOSARTAN POTASSIUM 100 MG PO TABS: 100.0000 mg | ORAL_TABLET | Freq: Every day | ORAL | 3 refills | Status: DC

## 2019-12-31 NOTE — Patient Instructions (Addendum)
Stable lab results. You will be contacted to schedule appt with neurology anf Brain MRI  COVID-19 Vaccine Information can be found at: [email or web address] For questions related to vaccine distribution or appointments, please email [email or web address] or call [phone].

## 2019-12-31 NOTE — Progress Notes (Signed)
Subjective:  Patient ID: Susan [name], female    DOB: [birth year]  Age: 86 y.o. MRN: [id number]  CC: Hyperlipidemia (She is not currently fasting.) and Hypertension  HPI [name] complaints of difficulty with word finding in last 78months: she recognizes objects, but unable to remember what they are called. She denies any change in mood. She takes alprazolam once daily. She is concerned since she had TIA and CVA in last. She lives alone at [place] facility.  HTN: BP at goal with amlodipine. BP Readings from Last 3 Encounters:  12/31/19 110/60  11/05/19 130/80  09/02/19 122/70   Wt Readings from Last 3 Encounters:  12/31/19 109 lb 6.4 oz (49.6 kg)  11/05/19 112 lb (50.8 kg)  09/02/19 108 lb 9.6 oz (49.3 kg)    Venous insufficiency: Improved LE edema with furosemide.  Reviewed past Medical, Social and Family history today.  Outpatient Medications Prior to Visit  Medication Sig Dispense Refill  . acetaminophen (TYLENOL) 650 MG CR tablet Take 650 mg by mouth every 8 (eight) hours as needed for pain.    Marland Kitchen ALPRAZolam (XANAX) 0.5 MG tablet Take 1 tablet (0.5 mg total) by mouth at bedtime as needed for anxiety. Need office visit for additional refills 30 tablet 0  . amLODipine (NORVASC) 5 MG tablet Take 1 tablet (5 mg total) by mouth at bedtime. 90 tablet 3  . aspirin EC 325 MG tablet Take 325 mg by mouth daily.    . cholestyramine (QUESTRAN) 4 g packet Take 1 packet (4 g total) by mouth daily. 30 each 0  . dicyclomine (BENTYL) 10 MG capsule Take 1 capsule by mouth twice daily 180 capsule 0  . fexofenadine (ALLEGRA) 180 MG tablet Take 180 mg by mouth daily.    . IRON PO Take 65 mg by mouth 3 (three) times a week.    . loperamide (IMODIUM) 2 MG capsule Take by mouth as needed for diarrhea or loose stools.    . metoprolol succinate (TOPROL-XL) 25 MG 24 hr tablet Take 1 tablet (25 mg total) by mouth daily. 90 tablet 3  . Vitamin D, Ergocalciferol, (DRISDOL) 1.25 MG  (50000 UT) CAPS capsule Take 1 capsule by mouth once a week 12 capsule 0  . famotidine (PEPCID) 20 MG tablet Take 1 tablet (20 mg total) by mouth daily. 90 tablet 1  . furosemide (LASIX) 20 MG tablet Take 1 tablet (20 mg total) by mouth daily. 90 tablet 1  . losartan (COZAAR) 100 MG tablet Take 1 tablet (100 mg total) by mouth daily. 90 tablet 1  . potassium chloride SA (K-DUR) 20 MEQ tablet Take 1 tablet (20 mEq total) by mouth daily. 90 tablet 1  . atorvastatin (LIPITOR) 10 MG tablet Take 1 tablet (10 mg total) by mouth daily. (Patient not taking: Reported on 10/21/2019) 90 tablet 1  . naproxen (NAPROSYN) 500 MG tablet Take 500 mg by mouth 2 (two) times daily.     No facility-administered medications prior to visit.   ROS Review of Systems  Constitutional: Negative.   Neurological: Negative for dizziness, speech change, focal weakness, weakness and headaches.  Psychiatric/Behavioral: Negative for depression and hallucinations. The patient does not have insomnia.     Objective:  BP 110/60 (BP Location: Left Arm, Patient Position: Sitting, Cuff Size: Normal)   Pulse 78   Temp (!) 97.1 F (36.2 C) (Temporal)   Wt 109 lb 6.4 oz (49.6 kg)   SpO2 97%   BMI 20.67 kg/m  BP Readings from Last 3 Encounters:  12/31/19 110/60  11/05/19 130/80  09/02/19 122/70    Wt Readings from Last 3 Encounters:  12/31/19 109 lb 6.4 oz (49.6 kg)  11/05/19 112 lb (50.8 kg)  09/02/19 108 lb 9.6 oz (49.3 kg)    Physical Exam Vitals reviewed.  Neck:     Thyroid: No thyroid mass, thyromegaly or thyroid tenderness.  Cardiovascular:     Rate and Rhythm: Normal rate and regular rhythm.     Pulses: Normal pulses.     Heart sounds: Normal heart sounds.  Pulmonary:     Effort: Pulmonary effort is normal.     Breath sounds: Normal breath sounds.  Musculoskeletal:     Cervical back: Normal range of motion and neck supple.     Right lower leg: Edema present.     Left lower leg: Edema present.    Lymphadenopathy:     Cervical: No cervical adenopathy.  Skin:    Findings: No erythema or rash.  Neurological:     Mental Status: She is alert and oriented to person, place, and time.     Cranial Nerves: No cranial nerve deficit.  Psychiatric:        Mood and Affect: Mood normal.        Behavior: Behavior normal.        Thought Content: Thought content normal.    Lab Results  Component Value Date   WBC 9.0 12/24/2018   HGB 13.3 12/24/2018   HCT 40.0 12/24/2018   PLT 328.0 12/24/2018   GLUCOSE 95 12/31/2019   CHOL 159 08/15/2019   TRIG 142.0 08/15/2019   HDL 74.40 08/15/2019   LDLDIRECT 102.0 05/01/2018   LDLCALC 56 08/15/2019   ALT 14 08/15/2019   AST 23 08/15/2019   NA 141 12/31/2019   K 4.8 12/31/2019   CL 107 12/31/2019   CREATININE 0.56 12/31/2019   BUN 15 12/31/2019   CO2 28 12/31/2019   TSH 2.96 08/15/2019   HGBA1C 5.7 08/15/2019    Assessment & Plan:  This visit occurred during the SARS-CoV-2 public health emergency.  Safety protocols were in place, including screening questions prior to the visit, additional usage of staff PPE, and extensive cleaning of exam room while observing appropriate contact time as indicated for disinfecting solutions.   Antinette was seen today for hyperlipidemia and hypertension.  Diagnoses and all orders for this visit:  Essential hypertension -     Basic metabolic panel -     losartan (COZAAR) 100 MG tablet; Take 1 tablet (100 mg total) by mouth daily. -     furosemide (LASIX) 20 MG tablet; Take 1 tablet (20 mg total) by mouth daily.  Anomic aphasia -     Ambulatory referral to Neurology -     MR Brain W Wo Contrast; Future  Venous insufficiency of both lower extremities -     potassium chloride SA (KLOR-CON) 20 MEQ tablet; Take 1 tablet (20 mEq total) by mouth daily. -     furosemide (LASIX) 20 MG tablet; Take 1 tablet (20 mg total) by mouth daily.  Gastroesophageal reflux disease with esophagitis without hemorrhage -      famotidine (PEPCID) 20 MG tablet; Take 1 tablet (20 mg total) by mouth daily.  History of CVA (cerebrovascular accident) without residual deficits -     Ambulatory referral to Neurology -     MR Brain W Wo Contrast; Future  Anemia, unspecified type   I have discontinued [employer]  naproxen and atorvastatin. I have also changed her potassium chloride SA. Additionally, I am having her maintain her fexofenadine, aspirin EC, acetaminophen, IRON PO, loperamide, metoprolol succinate, amLODipine, dicyclomine, Vitamin D (Ergocalciferol), ALPRAZolam, cholestyramine, famotidine, losartan, and furosemide.  Meds ordered this encounter  Medications  . famotidine (PEPCID) 20 MG tablet    Sig: Take 1 tablet (20 mg total) by mouth daily.    Dispense:  90 tablet    Refill:  3    Order Specific Question:   Supervising Provider    Answer:   MATTHEWS, CODY [[id number]]  . losartan (COZAAR) 100 MG tablet    Sig: Take 1 tablet (100 mg total) by mouth daily.    Dispense:  90 tablet    Refill:  3    Order Specific Question:   Supervising Provider    Answer:   MATTHEWS, CODY [[id number]]  . potassium chloride SA (KLOR-CON) 20 MEQ tablet    Sig: Take 1 tablet (20 mEq total) by mouth daily.    Dispense:  90 tablet    Refill:  1    Order Specific Question:   Supervising Provider    Answer:   MATTHEWS, CODY [[id number]]  . furosemide (LASIX) 20 MG tablet    Sig: Take 1 tablet (20 mg total) by mouth daily.    Dispense:  90 tablet    Refill:  1    Order Specific Question:   Supervising Provider    Answer:   MATTHEWS, CODY [[id number]]    Problem List Items Addressed This Visit      Cardiovascular and Mediastinum   CVA (cerebral vascular accident) ([place])   Relevant Medications   losartan (COZAAR) 100 MG tablet   furosemide (LASIX) 20 MG tablet   Essential hypertension - Primary   Relevant Medications   losartan (COZAAR) 100 MG tablet   furosemide (LASIX) 20 MG tablet   Other Relevant Orders   Basic metabolic panel  (Completed)   Venous insufficiency of both lower extremities   Relevant Medications   losartan (COZAAR) 100 MG tablet   potassium chloride SA (KLOR-CON) 20 MEQ tablet   furosemide (LASIX) 20 MG tablet     Digestive   GERD with esophagitis   Relevant Medications   famotidine (PEPCID) 20 MG tablet    Other Visit Diagnoses    Anomic aphasia       Relevant Orders   Ambulatory referral to Neurology   MR Brain W Wo Contrast   Anemia, unspecified type           Follow-up: Return in about 6 months (around 06/29/2020) for HTN and , hyperlipidemia.  Wilfred Lacy, NP

## 2020-01-02 ENCOUNTER — Telehealth: Payer: Self-pay | Admitting: Nurse Practitioner

## 2020-01-02 ENCOUNTER — Encounter: Payer: Self-pay | Admitting: Nurse Practitioner

## 2020-01-02 MED ORDER — FUROSEMIDE 20 MG PO TABS: 20.0000 mg | ORAL_TABLET | Freq: Every day | ORAL | 1 refills | Status: DC

## 2020-01-02 NOTE — Telephone Encounter (Signed)
Please inform Ms, Vullo than I have also order MRI Brain w/wo contrast while waiting for neuro consult. This is to eval for speech difficulty we discussed during her last OV.

## 2020-01-02 NOTE — Telephone Encounter (Signed)
Pt is aware.  

## 2020-01-07 ENCOUNTER — Ambulatory Visit (INDEPENDENT_AMBULATORY_CARE_PROVIDER_SITE_OTHER): Payer: Medicare Other | Admitting: Family Medicine

## 2020-01-07 ENCOUNTER — Other Ambulatory Visit: Payer: Self-pay

## 2020-01-07 ENCOUNTER — Encounter: Payer: Self-pay | Admitting: Family Medicine

## 2020-01-07 ENCOUNTER — Ambulatory Visit: Payer: Self-pay

## 2020-01-07 VITALS — BP 122/70 | HR 80 | Ht 61.0 in | Wt 109.0 lb

## 2020-01-07 DIAGNOSIS — G8929 Other chronic pain: Secondary | ICD-10-CM

## 2020-01-07 DIAGNOSIS — M7061 Trochanteric bursitis, right hip: Secondary | ICD-10-CM | POA: Diagnosis not present

## 2020-01-07 DIAGNOSIS — M25552 Pain in left hip: Secondary | ICD-10-CM | POA: Diagnosis not present

## 2020-01-07 DIAGNOSIS — M7062 Trochanteric bursitis, left hip: Secondary | ICD-10-CM | POA: Diagnosis not present

## 2020-01-07 DIAGNOSIS — M25551 Pain in right hip: Secondary | ICD-10-CM

## 2020-01-07 NOTE — Patient Instructions (Signed)
Stay safe! See me again in 8 weeks! We are here when you need [place]

## 2020-01-07 NOTE — Progress Notes (Signed)
[place] [address] [place] Phone: [phone] Subjective:   I Susan [name] am serving as a Education administrator for Dr. Hulan Saas.   This visit occurred during the SARS-CoV-2 public health emergency.  Safety protocols were in place, including screening questions prior to the visit, additional usage of staff PPE, and extensive cleaning of exam room while observing appropriate contact time as indicated for disinfecting solutions.   I'm seeing this patient by the request  of:  Nche, Charlene Brooke, NP  CC: Bilateral hip pain  [id number]   11/05/2019 Bilateral injections given today.  Tolerated the procedure well.  Discussed icing regimen and home exercise, discussed topical anti-inflammatories.  Patient will increase activity slowly.  Follow-up again in 48 weeks Discussed that patient continues to have atrophy in the area.  Patient does states that this is the only thing that gives her any type of relief.  Patient does not want to take any other medications and feels like she is on too many already.  I am also concerned because any medicine that would help her with pain could be somewhat detrimental to her balance and cause falls.  Patient will see me as stated above in 8 weeks  Update 01/07/2020 Susan [name] is a 86 y.o. female coming in with complaint of bilateral hip pain. Patient states her hips are feeling terrible. States she is in a lot of pain.  Continues to be on the lateral aspect of the hips.  Patient states not as much on the groin area at all.  Patient has had some back pain.  He states that he still seems to originate to the side of the hips more than anywhere else. Patient is wondering if a replacement would be more beneficial. Patient will denies any type of groin pain. Still affecting daily activities.      Past Medical History:  Diagnosis Date  . Actinic keratosis, hx of    chest wall (2012)  . Allergic rhinitis   . Anxiety     . Arthritis    "spine, hips" (01/30/2017)  . Bursitis   . Cellulitis of right foot 01/30/2017  . Colon polyps   . Diverticulosis   . Esophageal dysmotilities 10/16/2017  . Fibromyalgia    "qwhere" (01/30/2017)  . Gallstones   . GERD (gastroesophageal reflux disease)   . High cholesterol   . History of kidney stones   . History of stomach ulcers   . Hx of squamous cell carcinoma excision    right mandible (2011), right arm (2014)  . Hypertension   . IBS (irritable bowel syndrome)   . Osteoarthritis   . Pneumonia 2000s X 2   "twice" (01/30/2017)  . Pyloric stenosis   . Stroke [facility]) 10/2014   denies residual on 01/30/2017   Past Surgical History:  Procedure Laterality Date  . APPENDECTOMY    . CARDIOVASCULAR STRESS TEST  04/2016   nuclear cardiolite stress test done in [place] (no ischemia, no LVH, no LV systolic function)  . CATARACT EXTRACTION W/ INTRAOCULAR LENS  IMPLANT, BILATERAL Bilateral   . CHOLECYSTECTOMY OPEN    . COLONOSCOPY  2009, 2014  . DIAGNOSTIC MAMMOGRAM  2009, 2012  . TONSILLECTOMY     Social History   Socioeconomic History  . Marital status: Widowed    Spouse name: Not on file  . Number of children: 4  . Years of education: Not on file  . Highest education level: Not on file  Occupational  History  . Occupation: retired  Tobacco Use  . Smoking status: Never Smoker  . Smokeless tobacco: Never Used  Substance and Sexual Activity  . Alcohol use: Yes    Comment: glass of red wine maybe once week  . Drug use: No  . Sexual activity: Not on file  Other Topics Concern  . Not on file  Social History Narrative  . Not on file   Social Determinants of Health   Financial Resource Strain:   . Difficulty of Paying Living Expenses: Not on file  Food Insecurity:   . Worried About Charity fundraiser in the Last Year: Not on file  . Ran Out of Food in the Last Year: Not on file  Transportation Needs:   . Lack of Transportation (Medical): Not on file  .  Lack of Transportation (Non-Medical): Not on file  Physical Activity:   . Days of Exercise per Week: Not on file  . Minutes of Exercise per Session: Not on file  Stress:   . Feeling of Stress : Not on file  Social Connections:   . Frequency of Communication with Friends and Family: Not on file  . Frequency of Social Gatherings with Friends and Family: Not on file  . Attends Religious Services: Not on file  . Active Member of Clubs or Organizations: Not on file  . Attends Archivist Meetings: Not on file  . Marital Status: Not on file   Allergies  Allergen Reactions  . Butrans [Buprenorphine] Other (See Comments)    Severe chest pains  . Codeine Other (See Comments)    Severe Chest pains  . Morphine And Related Other (See Comments)    Severe chest pains   Family History  Problem Relation Age of Onset  . Stroke Mother   . Heart attack Mother   . Stroke Father   . Heart attack Father   . Heart attack Maternal Grandmother   . Stroke Maternal Grandmother   . Stroke Paternal Grandmother   . Heart attack Paternal Grandmother   . Heart attack Maternal Grandfather   . Stroke Maternal Grandfather   . Stroke Paternal Grandfather   . Heart attack Paternal Grandfather   . Colon cancer Neg Hx   . Pancreatic cancer Neg Hx   . Stomach cancer Neg Hx   . Esophageal cancer Neg Hx      Current Outpatient Medications (Cardiovascular):  .  amLODipine (NORVASC) 5 MG tablet, Take 1 tablet (5 mg total) by mouth at bedtime. .  cholestyramine (QUESTRAN) 4 g packet, Take 1 packet (4 g total) by mouth daily. .  furosemide (LASIX) 20 MG tablet, Take 1 tablet (20 mg total) by mouth daily. Marland Kitchen  losartan (COZAAR) 100 MG tablet, Take 1 tablet (100 mg total) by mouth daily. .  metoprolol succinate (TOPROL-XL) 25 MG 24 hr tablet, Take 1 tablet (25 mg total) by mouth daily.  Current Outpatient Medications (Respiratory):  .  fexofenadine (ALLEGRA) 180 MG tablet, Take 180 mg by mouth  daily.  Current Outpatient Medications (Analgesics):  .  acetaminophen (TYLENOL) 650 MG CR tablet, Take 650 mg by mouth every 8 (eight) hours as needed for pain. Marland Kitchen  aspirin EC 325 MG tablet, Take 325 mg by mouth daily.  Current Outpatient Medications (Hematological):  Marland Kitchen  IRON PO, Take 65 mg by mouth 3 (three) times a week.  Current Outpatient Medications (Other):  Marland Kitchen  ALPRAZolam (XANAX) 0.5 MG tablet, Take 1 tablet (0.5 mg total) by mouth  at bedtime as needed for anxiety. Need office visit for additional refills .  dicyclomine (BENTYL) 10 MG capsule, Take 1 capsule by mouth twice daily .  famotidine (PEPCID) 20 MG tablet, Take 1 tablet (20 mg total) by mouth daily. Marland Kitchen  loperamide (IMODIUM) 2 MG capsule, Take by mouth as needed for diarrhea or loose stools. .  potassium chloride SA (KLOR-CON) 20 MEQ tablet, Take 1 tablet (20 mEq total) by mouth daily. .  Vitamin D, Ergocalciferol, (DRISDOL) 1.25 MG (50000 UT) CAPS capsule, Take 1 capsule by mouth once a week   Reviewed prior external information including notes and imaging from  primary care provider As well as notes that were available from care everywhere and other healthcare systems.  Past medical history, social, surgical and family history all reviewed in electronic medical record.  No pertanent information unless stated regarding to the chief complaint.   Review of Systems:  No headache, visual changes, nausea, vomiting, diarrhea, constipation, dizziness, abdominal pain, skin rash, fevers, chills, night sweats, weight loss, swollen lymph nodes, body aches, joint swelling, chest pain, shortness of breath, mood changes. POSITIVE muscle aches  Objective  Blood pressure 122/70, pulse 80, height 5\' 1"  (1.549 m), weight 109 lb (49.4 kg), SpO2 95 %.   General: No apparent distress alert and oriented x3 mood and affect normal, dressed appropriately. Mild cachectic HEENT: Pupils equal, extraocular movements intact  Respiratory: Patient's  speak in full sentences and does not appear short of breath  Cardiovascular: No lower extremity edema, non tender, no erythema  Skin: Warm dry intact with no signs of infection or rash on extremities or on axial skeleton.  Abdomen: Soft nontender  Neuro: Cranial nerves II through XII are intact, neurovascularly intact in all extremities with 2+ DTRs and 2+ pulses.  Lymph: No lymphadenopathy of posterior or anterior cervical chain or axillae bilaterally.  Gait antalgic MSK: Severe arthritic changes of multiple joints Hip exams bilaterally show the patient has significant atrophy of the knee greater trochanteric area bilaterally. Severe tenderness noted. No erythema bone. Patient does have even visual swelling of the left bursa sac. Good internal range of motion but has decreased external range of motion secondary to pain. Back exam does show some loss of lordosis and some degenerative scoliosis.   Procedure: Real-time Ultrasound Guided Injection of right greater trochanteric bursitis secondary to patient's body habitus Device: GE Logiq Q7 Ultrasound guided injection is preferred based studies that show increased duration, increased effect, greater accuracy, decreased procedural pain, increased response rate, and decreased cost with ultrasound guided versus blind injection.  Verbal informed consent obtained.  Time-out conducted.  Noted no overlying erythema, induration, or other signs of local infection.  Skin prepped in a sterile fashion.  Local anesthesia: Topical Ethyl chloride.  With sterile technique and under real time ultrasound guidance:  Greater trochanteric area was visualized and patient's bursa was noted. A 22-gauge 3 inch needle was inserted and 4 cc of 0.5% Marcaine and 1 cc of Kenalog 40 mg/dL was injected. Pictures taken Completed without difficulty  Pain immediately resolved suggesting accurate placement of the medication.  Advised to call if fevers/chills, erythema, induration,  drainage, or persistent bleeding.  Images permanently stored and available for review in the ultrasound unit.  Impression: Technically successful ultrasound guided injection.   Procedure: Real-time Ultrasound Guided Injection of left  greater trochanteric bursitis secondary to patient's body habitus Device: GE Logiq Q7  Ultrasound guided injection is preferred based studies that show increased duration, increased effect, greater  accuracy, decreased procedural pain, increased response rate, and decreased cost with ultrasound guided versus blind injection.  Verbal informed consent obtained.  Time-out conducted.  Noted no overlying erythema, induration, or other signs of local infection.  Skin prepped in a sterile fashion.  Local anesthesia: Topical Ethyl chloride.  With sterile technique and under real time ultrasound guidance:  Greater trochanteric area was visualized and patient's bursa was noted. A 22-gauge 3 inch needle was inserted and 4 cc of 0.5% Marcaine and 1 cc of Kenalog 40 mg/dL was injected. Pictures taken Completed without difficulty  Pain immediately resolved suggesting accurate placement of the medication.  Advised to call if fevers/chills, erythema, induration, drainage, or persistent bleeding.  Images permanently stored and available for review in the ultrasound unit.  Impression: Technically successful ultrasound guided injection.    Impression and Recommendations:     This case required medical decision making of moderate complexity. The above documentation has been reviewed and is accurate and complete Lyndal Pulley, DO       Note: This dictation was prepared with Dragon dictation along with smaller phrase technology. Any transcriptional errors that result from this process are unintentional.

## 2020-01-07 NOTE — Assessment & Plan Note (Signed)
Repeat injections given again today.  Patient does have some underlying arthritis that can be contributing and she did discuss if she has any surgical candidate.  Discussed with patient not having any significant groin pain I do not know if patient's symptoms will be helped with hip replacement but I encouraged her to discuss with a orthopedic surgeon.  Patient similarly did not want a referral at this time.  I did discuss with patient in great length that I would talk to any family members related questions.  Patient feels like that is not necessary at the moment.  This is all we are doing at this moment is some symptomatic relief with these injections and patient has had atrophy so I am concerned to continue.  We discussed the possibility of other types of injections or see if we can even do some type of padding.  I will check with the [place] to see if there is anything we can do to make a large abdomen for the size of the hip to help give her some relief at night or with sitting.  Patient will follow up with me again in 8 weeks otherwise.  Total time discussed with patient and reviewing previous imaging and doing procedures greater than 35 minutes

## 2020-01-09 NOTE — Telephone Encounter (Signed)
[facility] called this morning to inform us that patient decided not to have MRI at this time. Patient told them she was just not ready to do it at this time. Appointment for 01/12/20 for MRI was canceled per [place] at patient request.

## 2020-01-09 NOTE — Telephone Encounter (Signed)
[place] FYI-- 

## 2020-01-12 ENCOUNTER — Other Ambulatory Visit: Payer: Medicare Other

## 2020-01-14 ENCOUNTER — Ambulatory Visit (INDEPENDENT_AMBULATORY_CARE_PROVIDER_SITE_OTHER): Payer: Medicare Other | Admitting: Neurology

## 2020-01-14 ENCOUNTER — Other Ambulatory Visit: Payer: Self-pay

## 2020-01-14 ENCOUNTER — Encounter: Payer: Self-pay | Admitting: Neurology

## 2020-01-14 VITALS — BP 148/76 | HR 73 | Temp 97.6°F | Ht 61.0 in | Wt 107.0 lb

## 2020-01-14 DIAGNOSIS — G245 Blepharospasm: Secondary | ICD-10-CM | POA: Diagnosis not present

## 2020-01-14 DIAGNOSIS — R479 Unspecified speech disturbances: Secondary | ICD-10-CM | POA: Diagnosis not present

## 2020-01-14 DIAGNOSIS — G3184 Mild cognitive impairment, so stated: Secondary | ICD-10-CM | POA: Diagnosis not present

## 2020-01-14 NOTE — Patient Instructions (Addendum)
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  I assured her that I did not think she had dementia of Alzheimer's type yet even though she has a strong family history.  I recommend we check memory panel labs, EEG and advised her to keep her appointment for MRI which was scheduled by primary physician.  Check carotid transcranial Doppler studies.  She also has essential blepharospasms and may benefit with consideration for Botox injection for treatment.  She states she would like to think about this and let us know.  She was encouraged to increase participation in cognitively challenging activities like solving crossword puzzles, playing bridge and sudoku.  We also discussed memory compensation strategies.  She was advised to use a wheeled walker at all times for fall and safety.  She will return for follow-up in the future in 2 months or call earlier if necessary.  Memory Compensation Strategies  1. Use "WARM" strategy.  W= write it down  A= associate it  R= repeat it  M= make a mental note  2.   You can keep a Social worker.  Use a 3-ring notebook with sections for the following: calendar, important names and phone numbers,  medications, doctors' names/phone numbers, lists/reminders, and a section to journal what you did  each day.   3.    Use a calendar to write appointments down.  4.    Write yourself a schedule for the day.  This can be placed on the calendar or in a separate section of the Memory Notebook.  Keeping a  regular schedule can help memory.  5.    Use medication organizer with sections for each day or morning/evening pills.  You may need help loading it  6.    Keep a basket, or pegboard by the door.  Place items that you need to take out with you in the basket or on the pegboard.  You may also want to  include a message board for  reminders.  7.    Use sticky notes.  Place sticky notes with reminders in a place where the task is performed.  For example: " turn off the  stove" placed by the stove, "lock the door" placed on the door at eye level, " take your medications" on  the bathroom mirror or by the place where you normally take your medications.  8.    Use alarms/timers.  Use while cooking to remind yourself to check on food or as a reminder to take your medicine, or as a  reminder to make a call, or as a reminder to perform another task, etc.  Benign Essential Blepharospasm, Adult Benign essential blepharospasm (BEB) is a nervous system condition that makes a person close his or her eyes without meaning to. Over time, episodes of this condition may gradually become more frequent and forceful, and eventually involve both eyes. This can make it hard for you to keep your eyes open and do activities such as watching television, driving, or reading. If this condition is not treated, the eyes may close forcefully for long periods of time. What are the causes? The exact cause of this condition is not known. The condition may be passed down through families through an abnormal gene. Symptoms of the condition may be triggered by:  The wind.  Sunlight.  Noise.  Stress.  Fatigue.  Bright light.  Reading.  Driving.  Walking outside.  Air pollution.  Eye strain. What increases the risk? You are more likely to develop this condition if:  You are age 74 or older.  You have a family history of the condition.  You are female.  There are problems with the part of your brain that controls movements (basal ganglia).  You have a movement disorder called dystonia.  You have a history of eye diseases or trauma to the eye(s).  You take certain medicines, such as medicines that treat Parkinson disease. What are the signs or symptoms? The first symptom of this condition is frequent eye blinking or twitching that  you cannot control. It may happen during the day and disappear at night. Other early symptoms include:  Eye dryness and irritation.  Eye irritation or pain from bright lights (photophobia). You may get temporary relief from your symptoms when you sing, yawn, chew, or laugh. Later symptoms of this condition include:  Winking and squinting for longer than usual.  Muscle spasms in your tongue and jaw (Meigesyndrome).  Inability to keep your eyes open for long periods of time. Over time, symptoms may get stronger and last longer. How is this diagnosed? This condition is diagnosed based on your symptoms, your medical history, and a physical exam. How is this treated? There is no cure for this condition, but treatment can help with symptoms. Treatment options include:  Applying moisturizing eye drops (artificial tears) to the eye. These drops help to relieve eye irritation and dryness.  Getting an injection of botulinum toxin into the muscles that control eyelid movement. This treatment may need to be repeated every few months.  Taking medicines such as muscle relaxants and anti-anxiety medicines.  Having surgery to remove part of the eyelid muscles (myectomy). This may be done if injections of botulinum toxin do not work or they stop working. Follow these instructions at home: Lifestyle  Wear tinted sunglasses that block UV (ultraviolet) light.  Wear eye protection outdoors on windy days.  Get enough sleep.  Try to manage or avoid stressful situations.  Do not watch TV or have screen time for long periods of time.  Avoid things that trigger your condition. General instructions  Learn as much as you can about your condition.  Work closely with your team of health care providers.  Use over-the-counter and prescription medicines, including eye drops, only as told by your health care provider.  Keep all follow-up visits as told by your health care provider. This is  important.  Do not drive if you are having an episode that affects how well you can see.  Keep your eyelids clean. Wash them daily with mild soap and water. This will help to prevent irritation and infection. Contact a health care provider if:  Your symptoms are not controlled with treatment.  Your eyes are red, teary, or dry.  Your eyes droop.  You feel anxious or depressed. Get help right away if:  You cannot open your eyes. Summary  Benign essential blepharospasm (BEB) is a nervous system condition that makes a person close his or her eyes without meaning to.  The exact cause of this condition is not known. The condition may be passed down through families through an abnormal gene.  There is no cure for this condition, but treatment can help with symptoms. This information is not intended to replace advice given to you by your health care provider. Make sure you discuss any questions you have with your health care provider.  Document Revised: 11/02/2017 Document Reviewed: 01/23/2017 Elsevier Patient Education  [place]. OnabotulinumtoxinA injection (Medical Use) What is this medicine? ONABOTULINUMTOXINA (o na BOTT you lye num tox in eh) is a neuro-muscular blocker. This medicine is used to treat crossed eyes, eyelid spasms, severe neck muscle spasms, ankle and toe muscle spasms, and elbow, wrist, and finger muscle spasms. It is also used to treat excessive underarm sweating, to prevent chronic migraine headaches, and to treat loss of bladder control due to neurologic conditions such as multiple sclerosis or spinal cord injury. This medicine may be used for other purposes; ask your health care provider or pharmacist if you have questions. COMMON BRAND NAME(S): Botox What should I tell my health care provider before I take this medicine? They need to know if you have any of these conditions:  breathing problems  cerebral palsy spasms  difficulty urinating  heart  problems  history of surgery where this medicine is going to be used  infection at the site where this medicine is going to be used  myasthenia gravis or other neurologic disease  nerve or muscle disease  surgery plans  take medicines that treat or prevent blood clots  thyroid problems  an unusual or allergic reaction to botulinum toxin, albumin, other medicines, foods, dyes, or preservatives  pregnant or trying to get pregnant  breast-feeding How should I use this medicine? This medicine is for injection into a muscle. It is given by a health care professional in a hospital or clinic setting. Talk to your pediatrician regarding the use of this medicine in children. While this drug may be prescribed for children as young as 61 years old for selected conditions, precautions do apply. Overdosage: If you think you have taken too much of this medicine contact a poison control center or emergency room at once. NOTE: This medicine is only for you. Do not share this medicine with others. What if I miss a dose? This does not apply. What may interact with this medicine?  aminoglycoside antibiotics like gentamicin, neomycin, tobramycin  muscle relaxants  other botulinum toxin injections This list may not describe all possible interactions. Give your health care provider a list of all the medicines, herbs, non-prescription drugs, or dietary supplements you use. Also tell them if you smoke, drink alcohol, or use illegal drugs. Some items may interact with your medicine. What should I watch for while using this medicine? Visit your doctor for regular check ups. This medicine will cause weakness in the muscle where it is injected. Tell your doctor if you feel unusually weak in other muscles. Get medical help right away if you have problems with breathing, swallowing, or talking. This medicine might make your eyelids droop or make you see blurry or double. If you have weak muscles or trouble  seeing do not drive a car, use machinery, or do other dangerous activities. This medicine contains albumin from human blood. It may be possible to pass an infection in this medicine, but no cases have been reported. Talk to your doctor about the risks and benefits of this medicine. If your activities have been limited by your condition, go back to your regular routine slowly after treatment with this medicine. What side effects may I notice from receiving this medicine? Side effects that you should report to your doctor or health care professional as soon as possible:  allergic reactions like skin rash, itching or hives, swelling of the face, lips, or tongue  breathing problems  changes in vision  chest pain or tightness  eye irritation, pain  fast, irregular heartbeat  infection  numbness  speech problems  swallowing problems  unusual weakness Side effects that usually do not require medical attention (report to your doctor or health care professional if they continue or are bothersome):  bruising or pain at site where injected  drooping eyelid  dry eyes or mouth  headache  muscles aches, pains  sensitivity to light  tearing This list may not describe all possible side effects. Call your doctor for medical advice about side effects. You may report side effects to FDA at 1-800-FDA-1088. Where should I keep my medicine? This drug is given in a hospital or clinic and will not be stored at home. NOTE: This sheet is a summary. It may not cover all possible information. If you have questions about this medicine, talk to your doctor, pharmacist, or health care provider.  2020 Elsevier/Gold Standard (2018-05-27 14:21:42)

## 2020-01-14 NOTE — Progress Notes (Signed)
[facility] [address] [place]. [place] [address] [phone]       OFFICE CONSULT NOTE  [name] Date of Birth:  1924-01-07 Medical Record Number:  [id number]   Referring MD: Wilfred Lacy, NP  Reason for Referral: Speech difficulty and TIA  HPI: [name] is a pleasant 86 year old Caucasian lady seen today for initial office consultation visit accompanied by her daughter Susan [name].  History is obtained from them and review of referral notes and electronic medical records.  No brain specific imaging films available for review.  [name] is a pleasant 86 year old Caucasian lady with past medical history of hypertension and gastroesophageal reflux disease who has noticed for the last several years that she is having some intermittent word finding difficulties and at times she cannot complete her sentences.  This seems to be getting worse the last 5 months or so.  She is still able to complete sentences but had to think about the words.  She is also having difficulty remembering some recent information.  She still quite active and lives in independent living facility.  She can write her own checks and pays the bills and makes her grocery list.  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  She is not driving.  She is still ambulating well with uses a wheeled walker and has had no falls or injuries.  She is worried she may have had a stroke or TIA.  She had an MRI scan of the brain ordered at [facility] but the patient called and canceled appointment since she needed more clarification for why it was necessary.  She denies any prior history of strokes TIAs seizures or significant neurological problems.  She does have strong family history of dementia in her mother as well as grandmother.  She is worried about Alzheimer's.  She had some recent lab work done basic metabolic panel in [id number] which is normal.  TSH on 08/15/2019 was 2.6 and LDL was 56  and A1c 5.7.  No recent brain imaging films available.  She has not had any lab work for reversible causes of cognitive impairment or EEG study.  ROS:   14 system review of systems is positive for speech difficulty, memory difficulties, involuntary movements and all other systems negative PMH:  Past Medical History:  Diagnosis Date  . Actinic keratosis, hx of    chest wall (2012)  . Allergic rhinitis   . Anxiety   . Arthritis    "spine, hips" (01/30/2017)  . Bursitis   . Cellulitis of right foot 01/30/2017  . Colon polyps   . Diverticulosis   . Esophageal dysmotilities 10/16/2017  . Fibromyalgia    "qwhere" (01/30/2017)  . Gallstones   . GERD (gastroesophageal reflux disease)   . High cholesterol   . History of kidney stones   . History of stomach ulcers   . Hx of squamous cell carcinoma excision    right mandible (2011), right arm (2014)  . Hypertension   . IBS (irritable bowel syndrome)   . Osteoarthritis   . Pneumonia 2000s X 2   "twice" (01/30/2017)  . Pyloric stenosis   . Stroke [facility]) 10/2014   denies residual on 01/30/2017    Social History:  Social History   Socioeconomic History  . Marital status: Widowed    Spouse name: Not on file  . Number of children: 4  . Years of education: Not on file  . Highest education level: Not on file  Occupational  History  . Occupation: retired  Tobacco Use  . Smoking status: Never Smoker  . Smokeless tobacco: Never Used  Substance and Sexual Activity  . Alcohol use: Yes    Comment: glass of red wine maybe once week  . Drug use: No  . Sexual activity: Not on file  Other Topics Concern  . Not on file  Social History Narrative  . Not on file   Social Determinants of Health   Financial Resource Strain:   . Difficulty of Paying Living Expenses: Not on file  Food Insecurity:   . Worried About Charity fundraiser in the Last Year: Not on file  . Ran Out of Food in the Last Year: Not on file  Transportation Needs:   .  Lack of Transportation (Medical): Not on file  . Lack of Transportation (Non-Medical): Not on file  Physical Activity:   . Days of Exercise per Week: Not on file  . Minutes of Exercise per Session: Not on file  Stress:   . Feeling of Stress : Not on file  Social Connections:   . Frequency of Communication with Friends and Family: Not on file  . Frequency of Social Gatherings with Friends and Family: Not on file  . Attends Religious Services: Not on file  . Active Member of Clubs or Organizations: Not on file  . Attends Archivist Meetings: Not on file  . Marital Status: Not on file  Intimate Partner Violence:   . Fear of Current or Ex-Partner: Not on file  . Emotionally Abused: Not on file  . Physically Abused: Not on file  . Sexually Abused: Not on file    Medications:   Current Outpatient Medications on File Prior to Visit  Medication Sig Dispense Refill  . acetaminophen (TYLENOL) 650 MG CR tablet Take 650 mg by mouth every 8 (eight) hours as needed for pain.    Marland Kitchen ALPRAZolam (XANAX) 0.5 MG tablet Take 1 tablet (0.5 mg total) by mouth at bedtime as needed for anxiety. Need office visit for additional refills 30 tablet 0  . amLODipine (NORVASC) 5 MG tablet Take 1 tablet (5 mg total) by mouth at bedtime. 90 tablet 3  . aspirin EC 325 MG tablet Take 325 mg by mouth daily.    . cholestyramine (QUESTRAN) 4 g packet Take 1 packet (4 g total) by mouth daily. 30 each 0  . dicyclomine (BENTYL) 10 MG capsule Take 1 capsule by mouth twice daily 180 capsule 0  . famotidine (PEPCID) 20 MG tablet Take 1 tablet (20 mg total) by mouth daily. 90 tablet 3  . fexofenadine (ALLEGRA) 180 MG tablet Take 180 mg by mouth daily.    . furosemide (LASIX) 20 MG tablet Take 1 tablet (20 mg total) by mouth daily. 90 tablet 1  . IRON PO Take 65 mg by mouth 3 (three) times a week.    . loperamide (IMODIUM) 2 MG capsule Take by mouth as needed for diarrhea or loose stools.    Marland Kitchen losartan (COZAAR) 100 MG  tablet Take 1 tablet (100 mg total) by mouth daily. 90 tablet 3  . metoprolol succinate (TOPROL-XL) 25 MG 24 hr tablet Take 1 tablet (25 mg total) by mouth daily. 90 tablet 3  . potassium chloride SA (KLOR-CON) 20 MEQ tablet Take 1 tablet (20 mEq total) by mouth daily. 90 tablet 1  . Vitamin D, Ergocalciferol, (DRISDOL) 1.25 MG (50000 UT) CAPS capsule Take 1 capsule by mouth once a week 12  capsule 0   No current facility-administered medications on file prior to visit.    Allergies:   Allergies  Allergen Reactions  . Butrans [Buprenorphine] Other (See Comments)    Severe chest pains  . Codeine Other (See Comments)    Severe Chest pains  . Morphine And Related Other (See Comments)    Severe chest pains    Physical Exam General: Frail elderly Caucasian lady seated, in no evident distress Head: head normocephalic and atraumatic.   Neck: supple with no carotid or supraclavicular bruits Cardiovascular: regular rate and rhythm, no murmurs Musculoskeletal: Mild kyphoscoliosis Skin:  no rash/petichiae Vascular:  Normal pulses all extremities  Neurologic Exam Mental Status: Awake and fully alert. Oriented to place and time. Recent and remote memory intact. Attention span, concentration and fund of knowledge appropriate. Mood and affect appropriate.  No aphasia apraxia or dysarthria.  3 word recall is normal at 3/3.  Clock drawing is 4/4.  She is able to name 11 animals which can walk on 4 legs. Cranial Nerves: Fundoscopic exam reveals sharp disc margins. Pupils equal, briskly reactive to light. Extraocular movements full without nystagmus.  She has frequent essential blepharospasm with nearly constant closing of her eyelids off and on.  Visual fields full to confrontation. Hearing significantly diminished in the left ear.. Facial sensation intact. Face, tongue, palate moves normally and symmetrically.  Motor: Normal bulk and tone. Normal strength in all tested extremity muscles. Sensory.:  intact to touch , pinprick , position and vibratory sensation.  Coordination: Rapid alternating movements normal in all extremities. Finger-to-nose and heel-to-shin performed accurately bilaterally. Gait and Station: Arises from chair without difficulty. Stance is stooped.  Uses a wheeled walker.. Gait mildly unsteady..  Unable to tandem walk.  Reflexes: 1+ and symmetric. Toes downgoing.       ASSESSMENT: 86 year old Caucasian lady with subacute mild word finding and memory difficulties  likely related to age-appropriate mild, memory impairment.  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  I assured her that I did not think she had dementia of Alzheimer's type yet even though she has a strong family history.  I recommend we check memory panel labs, EEG and advised her to keep her appointment for MRI which was scheduled by primary physician.  Check carotid transcranial Doppler studies.  She also has essential blepharospasms and may benefit with consideration for Botox injection for treatment.  She states she would like to think about this and let us know.  She was encouraged to increase participation in cognitively challenging activities like solving crossword puzzles, playing bridge and sudoku.  We also discussed memory compensation strategies.  She was advised to use a wheeled walker at all times for fall and safety.  Greater than 50% time during this 50-minute consultation visit was spent on counseling and coordination of care about her mild speech difficulties, mild cognitive impairment and blepharospasm and answering questions she will return for follow-up in the future in 2 months or call earlier if necessary.  Antony Contras, MD Note:  This document was prepared with digital dictation and possible smart phrase technology. Any transcriptional errors that result from this process are unintentional.

## 2020-01-15 LAB — DEMENTIA PANEL
Homocysteine: 13.9 umol/L (ref 0.0–21.3)
RPR Ser Ql: NONREACTIVE
TSH: 2.12 u[IU]/mL (ref 0.450–4.500)
Vitamin B-12: 410 pg/mL (ref 232–1245)

## 2020-01-16 ENCOUNTER — Telehealth: Payer: Self-pay | Admitting: Nurse Practitioner

## 2020-01-16 NOTE — Telephone Encounter (Signed)
error 

## 2020-01-19 DIAGNOSIS — Z23 Encounter for immunization: Secondary | ICD-10-CM | POA: Diagnosis not present

## 2020-01-20 ENCOUNTER — Other Ambulatory Visit: Payer: Self-pay

## 2020-01-21 ENCOUNTER — Encounter: Payer: Self-pay | Admitting: Nurse Practitioner

## 2020-01-21 ENCOUNTER — Ambulatory Visit (INDEPENDENT_AMBULATORY_CARE_PROVIDER_SITE_OTHER): Payer: Medicare Other | Admitting: Nurse Practitioner

## 2020-01-21 VITALS — BP 104/68 | HR 52 | Temp 97.2°F | Ht 61.0 in | Wt 106.4 lb

## 2020-01-21 DIAGNOSIS — H9202 Otalgia, left ear: Secondary | ICD-10-CM

## 2020-01-21 DIAGNOSIS — H6122 Impacted cerumen, left ear: Secondary | ICD-10-CM | POA: Diagnosis not present

## 2020-01-21 NOTE — Progress Notes (Signed)
Subjective:  Patient ID: Susan [name], female    DOB: 1924/06/08  Age: 86 y.o. MRN: [id number]  CC: Ear Fullness (no hurting, week of feeling full, hearing affected, left ear)  Ear Fullness  There is pain in the left ear. This is a new problem. The current episode started 1 to 4 weeks ago. The problem occurs constantly. The problem has been unchanged. The patient is experiencing no pain. Associated symptoms include hearing loss. Pertinent negatives include no ear discharge, headaches, neck pain, rash, rhinorrhea or sore throat. She has tried ear drops (debrox) for the symptoms. Her past medical history is significant for hearing loss. There is no history of a chronic ear infection or a tympanostomy tube.   Reviewed past Medical, Social and Family history today.  Outpatient Medications Prior to Visit  Medication Sig Dispense Refill  . acetaminophen (TYLENOL) 650 MG CR tablet Take 650 mg by mouth every 8 (eight) hours as needed for pain.    Marland Kitchen ALPRAZolam (XANAX) 0.5 MG tablet Take 1 tablet (0.5 mg total) by mouth at bedtime as needed for anxiety. Need office visit for additional refills 30 tablet 0  . amLODipine (NORVASC) 5 MG tablet Take 1 tablet (5 mg total) by mouth at bedtime. 90 tablet 3  . aspirin EC 325 MG tablet Take 325 mg by mouth daily.    . cholestyramine (QUESTRAN) 4 g packet Take 1 packet (4 g total) by mouth daily. 30 each 0  . dicyclomine (BENTYL) 10 MG capsule Take 1 capsule by mouth twice daily 180 capsule 0  . famotidine (PEPCID) 20 MG tablet Take 1 tablet (20 mg total) by mouth daily. 90 tablet 3  . fexofenadine (ALLEGRA) 180 MG tablet Take 180 mg by mouth daily.    . furosemide (LASIX) 20 MG tablet Take 1 tablet (20 mg total) by mouth daily. 90 tablet 1  . IRON PO Take 65 mg by mouth 3 (three) times a week.    . loperamide (IMODIUM) 2 MG capsule Take by mouth as needed for diarrhea or loose stools.    Marland Kitchen losartan (COZAAR) 100 MG tablet Take 1 tablet (100 mg total) by mouth  daily. 90 tablet 3  . metoprolol succinate (TOPROL-XL) 25 MG 24 hr tablet Take 1 tablet (25 mg total) by mouth daily. 90 tablet 3  . potassium chloride SA (KLOR-CON) 20 MEQ tablet Take 1 tablet (20 mEq total) by mouth daily. 90 tablet 1  . Vitamin D, Ergocalciferol, (DRISDOL) 1.25 MG (50000 UT) CAPS capsule Take 1 capsule by mouth once a week 12 capsule 0   No facility-administered medications prior to visit.    ROS See HPI  Procedure Note :  Procedure :  Ear irrigation (left)   Indication:  Cerumen impaction and hearing loss (left)   Risks, including pain, dizziness, eardrum perforation, bleeding, infection and others as well as benefits were explained to the patient in detail. Verbal consent was obtained and the patient agreed to proceed.   We used "The Elephant Ear Irrigation Device" filled with lukewarm water for irrigation. A small amount wax was recovered. Procedure has also required manual wax removal with an ear loop. Tolerated well, but unable to completely removed cerumen due to discomfort and posterior location.  Postprocedure instructions :  Call if problems.  Objective:  BP 104/68   Pulse (!) 52   Temp (!) 97.2 F (36.2 C) (Tympanic)   Ht 5\' 1"  (1.549 m)   Wt 106 lb 6.4 oz (48.3 kg)  SpO2 95%   BMI 20.10 kg/m   BP Readings from Last 3 Encounters:  01/21/20 104/68  01/14/20 (!) 148/76  01/07/20 122/70   Wt Readings from Last 3 Encounters:  01/21/20 106 lb 6.4 oz (48.3 kg)  01/14/20 107 lb (48.5 kg)  01/07/20 109 lb (49.4 kg)    Physical Exam HENT:     Right Ear: External ear normal. Decreased hearing noted. A middle ear effusion is present. There is no impacted cerumen. Tympanic membrane is not injected, perforated or erythematous.     Left Ear: External ear normal. Decreased hearing noted. There is impacted cerumen.     Ears:     Comments: Use of hearing aid in right ear Musculoskeletal:     Cervical back: Normal range of motion and neck supple.   Neurological:     Mental Status: She is alert and oriented to person, place, and time.    Lab Results  Component Value Date   WBC 9.0 12/24/2018   HGB 13.3 12/24/2018   HCT 40.0 12/24/2018   PLT 328.0 12/24/2018   GLUCOSE 95 12/31/2019   CHOL 159 08/15/2019   TRIG 142.0 08/15/2019   HDL 74.40 08/15/2019   LDLDIRECT 102.0 05/01/2018   LDLCALC 56 08/15/2019   ALT 14 08/15/2019   AST 23 08/15/2019   NA 141 12/31/2019   K 4.8 12/31/2019   CL 107 12/31/2019   CREATININE 0.56 12/31/2019   BUN 15 12/31/2019   CO2 28 12/31/2019   TSH 2.120 01/14/2020   HGBA1C 5.7 08/15/2019    Assessment & Plan:  This visit occurred during the SARS-CoV-2 public health emergency.  Safety protocols were in place, including screening questions prior to the visit, additional usage of staff PPE, and extensive cleaning of exam room while observing appropriate contact time as indicated for disinfecting solutions.   Susan [name] was seen today for ear fullness.  Diagnoses and all orders for this visit:  Left ear pain -     Ambulatory referral to ENT  Impacted cerumen of left ear -     Ambulatory referral to ENT   I am having Susan [name] maintain her fexofenadine, aspirin EC, acetaminophen, IRON PO, loperamide, metoprolol succinate, amLODipine, dicyclomine, Vitamin D (Ergocalciferol), ALPRAZolam, cholestyramine, famotidine, losartan, potassium chloride SA, and furosemide.  No orders of the defined types were placed in this encounter.   Problem List Items Addressed This Visit    None    Visit Diagnoses    Left ear pain    -  Primary   Relevant Orders   Ambulatory referral to ENT   Impacted cerumen of left ear       Relevant Orders   Ambulatory referral to ENT       Follow-up: Return if symptoms worsen or fail to improve.  Wilfred Lacy, NP

## 2020-01-21 NOTE — Patient Instructions (Signed)
Unfortunate we were unable to remove all cerumen in left ear. I entered urgent referral to ENT for your left ear can be evaluated and cerumen removed. You will be contacted with appt date and time.

## 2020-01-22 ENCOUNTER — Ambulatory Visit (HOSPITAL_COMMUNITY): Payer: Medicare Other

## 2020-01-27 ENCOUNTER — Ambulatory Visit (HOSPITAL_BASED_OUTPATIENT_CLINIC_OR_DEPARTMENT_OTHER)
Admission: RE | Admit: 2020-01-27 | Discharge: 2020-01-27 | Disposition: A | Discharge status: Home / Self Care | Payer: Medicare Other | Source: Ambulatory Visit | Attending: Neurology | Admitting: Neurology

## 2020-01-27 ENCOUNTER — Other Ambulatory Visit: Payer: Self-pay

## 2020-01-27 ENCOUNTER — Ambulatory Visit (HOSPITAL_COMMUNITY)
Admission: RE | Admit: 2020-01-27 | Discharge: 2020-01-27 | Disposition: A | Discharge status: Home / Self Care | Payer: Medicare Other | Source: Ambulatory Visit | Attending: Neurology | Admitting: Neurology

## 2020-01-27 DIAGNOSIS — R479 Unspecified speech disturbances: Secondary | ICD-10-CM | POA: Diagnosis not present

## 2020-01-27 NOTE — Progress Notes (Signed)
Bilateral carotid artery duplex and TCD exam performed.  Preliminary results can be found under CV proc under chart review.  01/27/2020 2:07 PM  Demetrion Wesby, K., RDMS, RVT

## 2020-01-30 ENCOUNTER — Other Ambulatory Visit: Payer: Self-pay | Admitting: Nurse Practitioner

## 2020-01-30 DIAGNOSIS — F418 Other specified anxiety disorders: Secondary | ICD-10-CM

## 2020-02-02 ENCOUNTER — Ambulatory Visit (INDEPENDENT_AMBULATORY_CARE_PROVIDER_SITE_OTHER): Payer: Medicare Other | Admitting: Neurology

## 2020-02-02 ENCOUNTER — Other Ambulatory Visit: Payer: Self-pay

## 2020-02-02 DIAGNOSIS — R4701 Aphasia: Secondary | ICD-10-CM

## 2020-02-02 DIAGNOSIS — R479 Unspecified speech disturbances: Secondary | ICD-10-CM

## 2020-02-02 DIAGNOSIS — G245 Blepharospasm: Secondary | ICD-10-CM

## 2020-02-02 DIAGNOSIS — G3184 Mild cognitive impairment, so stated: Secondary | ICD-10-CM

## 2020-02-02 NOTE — Telephone Encounter (Signed)
[place] for virtual visit? Please advise

## 2020-02-03 NOTE — Progress Notes (Signed)
Kindly inform the patient that carotid ultrasound study shows mild narrowing of the carotid artery in the neck on the right but this is not worrisome at the present time and can be managed with medical treatment.  She will need follow-up carotid ultrasound in 6 to 12 months.

## 2020-02-03 NOTE — Progress Notes (Signed)
Kindly inform the patient that transcranial Doppler study shows normal velocities in the blood vessels towards the back of the brain but suboptimal evaluation of vessels in the front due to suboptimal bony windows.

## 2020-02-04 NOTE — Procedures (Signed)
     History: Susan [name] is a 86 year old patient with history of episodic word finding difficulties, at times she cannot complete sentences.  There is a strong family history of dementia.  The patient is being evaluated for the word finding issues.  This is a routine EEG.  No skull defects are noted.  Medications include Xanax, Norvasc, aspirin, Questran, dicyclomine, Pepcid, Allegra, Lasix, Imodium, Cozaar, metoprolol, potassium, and vitamin D supplementation.  EEG classification: Dysrhythmia grade 2 left greater than right frontotemporal  Description of recording: The background rhythms of this recording consists of a well-modulated medium amplitude alpha rhythm of 8 Hz that is reactive to eye opening and closure.  As the record progresses, photic stimulation is performed, this resulted in a minimal but bilateral photic driving response.  Hyperventilation is also performed and results in a slight buildup of background rhythm activities without significant slowing seen.  At no time during the recording does there appear to be evidence of actual spike or spike-wave discharges but there are occasional episodes of sharp activity emanating from the frontotemporal regions independently, left greater than right.  The patient appears to remain in the waking state throughout the recording.  EKG monitor shows no evidence of cardiac rhythm abnormalities with a heart rate of 54.  Impression: This is an abnormal EEG recording secondary to occasional sharp transients emanating from the left greater than right frontotemporal regions.  No electrographic seizures were recorded, but this study may suggest a lowered seizure threshold.  Clinical correlation is required.

## 2020-02-04 NOTE — Progress Notes (Signed)
Kindly inform the patient that EEG study of the brain shows slight irritability of the brain on both sides in the front but no definite seizure activity.  Nothing to worry about

## 2020-02-04 NOTE — Progress Notes (Signed)
Kindly inform the patient that lab work for reversible causes of memory loss which included vitamin B12, thyroid hormone, test for syphilis and homocystine were all normal

## 2020-02-05 ENCOUNTER — Telehealth: Payer: Self-pay | Admitting: *Deleted

## 2020-02-05 NOTE — Telephone Encounter (Signed)
I called the pt's daughter Vaughan Basta (on [place]- attended visit with pt) and a gentleman answered. Linda not available. He will have her call back later.

## 2020-02-05 NOTE — Telephone Encounter (Signed)
Pt's daughter Susan [name] called me back. We discussed the results from Dr. Leonie Man (EEG, dementia panel, TCDs, and carotid ultrasound) as detailed below. She verbalized understanding of all. Her questions were answered. Pt will f/u next month with Dr. Leonie Man on 03/22/20 @ 1:20 pm arrival 15 minutes prior.

## 2020-02-05 NOTE — Telephone Encounter (Addendum)
     Garvin Fila, MD  02/04/2020 7:06 PM EST    Kindly inform the patient that lab work for reversible causes of memory loss which included vitamin B12, thyroid hormone, test for syphilis and homocystine were all normal     Garvin Fila, MD  02/04/2020 6:57 PM EST    Kindly inform the patient that EEG study of the brain shows slight irritability of the brain on both sides in the front but no definite seizure activity. Nothing to worry about   Garvin Fila, MD  02/03/2020 5:54 PM EST    Kindly inform the patient that transcranial Doppler study shows normal velocities in the blood vessels towards the back of the brain but suboptimal evaluation of vessels in the front due to suboptimal bony windows.    ----- Message ----- From: Garvin Fila, MD Sent: 02/03/2020   5:53 PM EST  Kindly inform the patient that carotid ultrasound study shows mild narrowing of the carotid artery in the neck on the right but this is not worrisome at the present time and can be managed with medical treatment. She will need follow-up carotid ultrasound in 6 to 12 months.

## 2020-02-11 ENCOUNTER — Other Ambulatory Visit: Payer: Self-pay

## 2020-02-11 ENCOUNTER — Ambulatory Visit (INDEPENDENT_AMBULATORY_CARE_PROVIDER_SITE_OTHER): Payer: Medicare Other | Admitting: Otolaryngology

## 2020-02-11 VITALS — Temp 97.9°F

## 2020-02-11 DIAGNOSIS — H903 Sensorineural hearing loss, bilateral: Secondary | ICD-10-CM

## 2020-02-11 DIAGNOSIS — H6123 Impacted cerumen, bilateral: Secondary | ICD-10-CM | POA: Diagnosis not present

## 2020-02-11 NOTE — Progress Notes (Signed)
HPI: Susan [name] is a 86 y.o. female who presents for evaluation of blockage of her hearing.  She wears bilateral hearing aids and has had these for years.  She has had blockage predominately of the left side.  She has had occasional soreness in the left ear..  Past Medical History:  Diagnosis Date  . Actinic keratosis, hx of    chest wall (2012)  . Allergic rhinitis   . Anxiety   . Arthritis    "spine, hips" (01/30/2017)  . Bursitis   . Cellulitis of right foot 01/30/2017  . Colon polyps   . Diverticulosis   . Esophageal dysmotilities 10/16/2017  . Fibromyalgia    "qwhere" (01/30/2017)  . Gallstones   . GERD (gastroesophageal reflux disease)   . High cholesterol   . History of kidney stones   . History of stomach ulcers   . Hx of squamous cell carcinoma excision    right mandible (2011), right arm (2014)  . Hypertension   . IBS (irritable bowel syndrome)   . Osteoarthritis   . Pneumonia 2000s X 2   "twice" (01/30/2017)  . Pyloric stenosis   . Stroke [facility]) 10/2014   denies residual on 01/30/2017   Past Surgical History:  Procedure Laterality Date  . APPENDECTOMY    . CARDIOVASCULAR STRESS TEST  04/2016   nuclear cardiolite stress test done in [place] (no ischemia, no LVH, no LV systolic function)  . CATARACT EXTRACTION W/ INTRAOCULAR LENS  IMPLANT, BILATERAL Bilateral   . CHOLECYSTECTOMY OPEN    . COLONOSCOPY  2009, 2014  . DIAGNOSTIC MAMMOGRAM  2009, 2012  . TONSILLECTOMY     Social History   Socioeconomic History  . Marital status: Widowed    Spouse name: Not on file  . Number of children: 4  . Years of education: Not on file  . Highest education level: Not on file  Occupational History  . Occupation: retired  Tobacco Use  . Smoking status: Never Smoker  . Smokeless tobacco: Never Used  Substance and Sexual Activity  . Alcohol use: Yes    Comment: glass of red wine maybe once week  . Drug use: No  . Sexual activity: Not on file  Other Topics Concern  .  Not on file  Social History Narrative  . Not on file   Social Determinants of Health   Financial Resource Strain:   . Difficulty of Paying Living Expenses: Not on file  Food Insecurity:   . Worried About Charity fundraiser in the Last Year: Not on file  . Ran Out of Food in the Last Year: Not on file  Transportation Needs:   . Lack of Transportation (Medical): Not on file  . Lack of Transportation (Non-Medical): Not on file  Physical Activity:   . Days of Exercise per Week: Not on file  . Minutes of Exercise per Session: Not on file  Stress:   . Feeling of Stress : Not on file  Social Connections:   . Frequency of Communication with Friends and Family: Not on file  . Frequency of Social Gatherings with Friends and Family: Not on file  . Attends Religious Services: Not on file  . Active Member of Clubs or Organizations: Not on file  . Attends Archivist Meetings: Not on file  . Marital Status: Not on file   Family History  Problem Relation Age of Onset  . Stroke Mother   . Heart attack Mother   . Stroke Father   .  Heart attack Father   . Heart attack Maternal Grandmother   . Stroke Maternal Grandmother   . Stroke Paternal Grandmother   . Heart attack Paternal Grandmother   . Heart attack Maternal Grandfather   . Stroke Maternal Grandfather   . Stroke Paternal Grandfather   . Heart attack Paternal Grandfather   . Colon cancer Neg Hx   . Pancreatic cancer Neg Hx   . Stomach cancer Neg Hx   . Esophageal cancer Neg Hx    Allergies  Allergen Reactions  . Butrans [Buprenorphine] Other (See Comments)    Severe chest pains  . Codeine Other (See Comments)    Severe Chest pains  . Morphine And Related Other (See Comments)    Severe chest pains   Prior to Admission medications   Medication Sig Start Date End Date Taking? Authorizing Provider  acetaminophen (TYLENOL) 650 MG CR tablet Take 650 mg by mouth every 8 (eight) hours as needed for pain.    [provider]  ALPRAZolam Duanne Moron) 0.5 MG tablet Take 1 tablet (0.5 mg total) by mouth daily as needed for anxiety. 02/02/20   Nche, Charlene Brooke, NP  amLODipine (NORVASC) 5 MG tablet Take 1 tablet (5 mg total) by mouth at bedtime. 09/02/19   Nche, Charlene Brooke, NP  aspirin EC 325 MG tablet Take 325 mg by mouth daily.    [provider]  cholestyramine (QUESTRAN) 4 g packet Take 1 packet (4 g total) by mouth daily. 12/30/19   Nche, Charlene Brooke, NP  dicyclomine (BENTYL) 10 MG capsule Take 1 capsule by mouth twice daily 12/02/19   Nche, Charlene Brooke, NP  famotidine (PEPCID) 20 MG tablet Take 1 tablet (20 mg total) by mouth daily. 12/31/19   Nche, Charlene Brooke, NP  fexofenadine (ALLEGRA) 180 MG tablet Take 180 mg by mouth daily.    [provider]  furosemide (LASIX) 20 MG tablet Take 1 tablet (20 mg total) by mouth daily. 01/02/20   Nche, Charlene Brooke, NP  IRON PO Take 65 mg by mouth 3 (three) times a week.    [provider]  loperamide (IMODIUM) 2 MG capsule Take by mouth as needed for diarrhea or loose stools.    [provider]  losartan (COZAAR) 100 MG tablet Take 1 tablet (100 mg total) by mouth daily. 12/31/19   Nche, Charlene Brooke, NP  metoprolol succinate (TOPROL-XL) 25 MG 24 hr tablet Take 1 tablet (25 mg total) by mouth daily. 08/16/19   Nche, Charlene Brooke, NP  potassium chloride SA (KLOR-CON) 20 MEQ tablet Take 1 tablet (20 mEq total) by mouth daily. 12/31/19   Nche, Charlene Brooke, NP  Vitamin D, Ergocalciferol, (DRISDOL) 1.25 MG (50000 UT) CAPS capsule Take 1 capsule by mouth once a week 12/02/19   Lyndal Pulley, DO     Positive ROS: Otherwise negative  All other systems have been reviewed and were otherwise negative with the exception of those mentioned in the HPI and as above.  Physical Exam: Constitutional: Alert, well-appearing, no acute distress Ears: External ears without lesions or tenderness. Ear canals left ear canal is partially  occluded with cerumen that was removed with forceps.  Of note she also had a sore of the ear canal inferiorly more laterally and may be related to the hearing aid use.  After cleaning the ear canal I applied gentian violet and CSF powder to the left inferior canal sore..  Right ear canal had mild cerumen buildup that was cleaned with  a curette.  TMs were clear bilaterally.. Nasal: External nose without lesions. Clear nasal passages Oral: Oropharynx clear. Neck: No palpable adenopathy or masses Respiratory: Breathing comfortably  Skin: No facial/neck lesions or rash noted.  Cerumen impaction removal  Date/Time: 02/11/2020 1:14 PM Performed by: Rozetta Nunnery, MD Authorized by: Rozetta Nunnery, MD   Consent:    Consent obtained:  Verbal   Consent given by:  Patient   Risks discussed:  Pain and bleeding Procedure details:    Location:  L ear and R ear   Procedure type: curette, suction and forceps   Post-procedure details:    Inspection:  TM intact   Hearing quality:  Improved   Patient tolerance of procedure:  Tolerated well, no immediate complications Comments:     TMs are clear bilaterally.  Of note patient has a small sore inferiorly in the lateral portion of the left ear canal.  I applied gentian violet and CSF powder to the left ear canal sore.    Assessment: Cerumen impaction in patient who wears bilateral hearing aids Left ear canal sore.  Plan: I applied gentian violet and CSF powder to the left ear canal sore and will recommend leaving the hearing aid out for 2 days.  Recommend follow-up with her audiologist concerning reevaluation of the ear canal sore in adjustment of the hearing aid position as needed.  Radene Journey, MD

## 2020-03-03 ENCOUNTER — Telehealth: Payer: Self-pay | Admitting: Nurse Practitioner

## 2020-03-03 ENCOUNTER — Other Ambulatory Visit: Payer: Self-pay

## 2020-03-03 ENCOUNTER — Encounter: Payer: Self-pay | Admitting: Family Medicine

## 2020-03-03 ENCOUNTER — Ambulatory Visit (INDEPENDENT_AMBULATORY_CARE_PROVIDER_SITE_OTHER): Payer: Medicare Other | Admitting: Family Medicine

## 2020-03-03 DIAGNOSIS — R739 Hyperglycemia, unspecified: Secondary | ICD-10-CM

## 2020-03-03 DIAGNOSIS — M7062 Trochanteric bursitis, left hip: Secondary | ICD-10-CM | POA: Diagnosis not present

## 2020-03-03 DIAGNOSIS — M7061 Trochanteric bursitis, right hip: Secondary | ICD-10-CM | POA: Diagnosis not present

## 2020-03-03 DIAGNOSIS — I10 Essential (primary) hypertension: Secondary | ICD-10-CM

## 2020-03-03 NOTE — Progress Notes (Signed)
  Chronic Care Management   Outreach Note  03/03/2020 Name: Bentlie Dorazio MRN: [id number] DOB: Feb 12, 1924  Referred by: Flossie Buffy, NP Reason for referral : No chief complaint on file.   An unsuccessful telephone outreach was attempted today. The patient was referred to the pharmacist for assistance with care management and care coordination.   Follow Up Plan:   Earney Hamburg Upstream Scheduler

## 2020-03-03 NOTE — Progress Notes (Signed)
  Chronic Care Management   Note  03/03/2020 Name: Susan [name] MRN: [id number] DOB: Nov 21, 1924  Susan [name] is a 86 y.o. year old female who is a primary care patient of Nche, Charlene Brooke, NP. I reached out to [place] by phone today in response to a referral sent by [name]'s PCP, Nche, Charlene Brooke, NP.   Susan [name] was given information about Chronic Care Management services today including:  1. CCM service includes personalized support from designated clinical staff supervised by her physician, including individualized plan of care and coordination with other care providers 2. 24/7 contact phone numbers for assistance for urgent and routine care needs. 3. Service will only be billed when office clinical staff spend 20 minutes or more in a month to coordinate care. 4. Only one practitioner may furnish and bill the service in a calendar month. 5. The patient may stop CCM services at any time (effective at the end of the month) by phone call to the office staff.   Patient agreed to services and verbal consent obtained.   Follow up plan:   Earney Hamburg Upstream Scheduler

## 2020-03-03 NOTE — Patient Instructions (Signed)
See me again in  

## 2020-03-03 NOTE — Telephone Encounter (Signed)
CCM referral entered.  ?

## 2020-03-03 NOTE — Progress Notes (Signed)
[place] [address] [place] Phone: [phone] Subjective:   I Susan [name] am serving as a Education administrator for Dr. Hulan Saas.  This visit occurred during the SARS-CoV-2 public health emergency.  Safety protocols were in place, including screening questions prior to the visit, additional usage of staff PPE, and extensive cleaning of exam room while observing appropriate contact time as indicated for disinfecting solutions.   I'm seeing this patient by the request  of:  Nche, Charlene Brooke, NP  CC: Bilateral hip pain  [id number]   01/07/2020 Repeat injections given again today.  Patient does have some underlying arthritis that can be contributing and she did discuss if she has any surgical candidate.  Discussed with patient not having any significant groin pain I do not know if patient's symptoms will be helped with hip replacement but I encouraged her to discuss with a orthopedic surgeon.  Patient similarly did not want a referral at this time.  I did discuss with patient in great length that I would talk to any family members related questions.  Patient feels like that is not necessary at the moment.  This is all we are doing at this moment is some symptomatic relief with these injections and patient has had atrophy so I am concerned to continue.  We discussed the possibility of other types of injections or see if we can even do some type of padding.  I will check with the [place] to see if there is anything we can do to make a large abdomen for the size of the hip to help give her some relief at night or with sitting.  Patient will follow up with me again in 8 weeks otherwise.  Total time discussed with patient and reviewing previous imaging and doing procedures greater than 35 minutes  Update 03/03/2020 Susan [name] is a 87 y.o. female coming in with complaint of bilateral hip pain. Patient states continues to have significant amount  of pain.  Does get relief from the injection for least 4 weeks.  Movement is fully started waking her up again.  Patient continues to walk with a walker at this time.  Trying to sleep with a pillow on her side.  No increase in weakness or numbness.  No radiation down the legs.     Past Medical History:  Diagnosis Date  . Actinic keratosis, hx of    chest wall (2012)  . Allergic rhinitis   . Anxiety   . Arthritis    "spine, hips" (01/30/2017)  . Bursitis   . Cellulitis of right foot 01/30/2017  . Colon polyps   . Diverticulosis   . Esophageal dysmotilities 10/16/2017  . Fibromyalgia    "qwhere" (01/30/2017)  . Gallstones   . GERD (gastroesophageal reflux disease)   . High cholesterol   . History of kidney stones   . History of stomach ulcers   . Hx of squamous cell carcinoma excision    right mandible (2011), right arm (2014)  . Hypertension   . IBS (irritable bowel syndrome)   . Osteoarthritis   . Pneumonia 2000s X 2   "twice" (01/30/2017)  . Pyloric stenosis   . Stroke [facility]) 10/2014   denies residual on 01/30/2017   Past Surgical History:  Procedure Laterality Date  . APPENDECTOMY    . CARDIOVASCULAR STRESS TEST  04/2016   nuclear cardiolite stress test done in [place] (no ischemia, no LVH, no LV systolic function)  .  CATARACT EXTRACTION W/ INTRAOCULAR LENS  IMPLANT, BILATERAL Bilateral   . CHOLECYSTECTOMY OPEN    . COLONOSCOPY  2009, 2014  . DIAGNOSTIC MAMMOGRAM  2009, 2012  . TONSILLECTOMY     Social History   Socioeconomic History  . Marital status: Widowed    Spouse name: Not on file  . Number of children: 4  . Years of education: Not on file  . Highest education level: Not on file  Occupational History  . Occupation: retired  Tobacco Use  . Smoking status: Never Smoker  . Smokeless tobacco: Never Used  Substance and Sexual Activity  . Alcohol use: Yes    Comment: glass of red wine maybe once week  . Drug use: No  . Sexual activity: Not on file  Other  Topics Concern  . Not on file  Social History Narrative  . Not on file   Social Determinants of Health   Financial Resource Strain:   . Difficulty of Paying Living Expenses:   Food Insecurity:   . Worried About Charity fundraiser in the Last Year:   . Arboriculturist in the Last Year:   Transportation Needs:   . Film/video editor (Medical):   Marland Kitchen Lack of Transportation (Non-Medical):   Physical Activity:   . Days of Exercise per Week:   . Minutes of Exercise per Session:   Stress:   . Feeling of Stress :   Social Connections:   . Frequency of Communication with Friends and Family:   . Frequency of Social Gatherings with Friends and Family:   . Attends Religious Services:   . Active Member of Clubs or Organizations:   . Attends Archivist Meetings:   Marland Kitchen Marital Status:    Allergies  Allergen Reactions  . Butrans [Buprenorphine] Other (See Comments)    Severe chest pains  . Codeine Other (See Comments)    Severe Chest pains  . Morphine And Related Other (See Comments)    Severe chest pains   Family History  Problem Relation Age of Onset  . Stroke Mother   . Heart attack Mother   . Stroke Father   . Heart attack Father   . Heart attack Maternal Grandmother   . Stroke Maternal Grandmother   . Stroke Paternal Grandmother   . Heart attack Paternal Grandmother   . Heart attack Maternal Grandfather   . Stroke Maternal Grandfather   . Stroke Paternal Grandfather   . Heart attack Paternal Grandfather   . Colon cancer Neg Hx   . Pancreatic cancer Neg Hx   . Stomach cancer Neg Hx   . Esophageal cancer Neg Hx      Current Outpatient Medications (Cardiovascular):  .  amLODipine (NORVASC) 5 MG tablet, Take 1 tablet (5 mg total) by mouth at bedtime. .  cholestyramine (QUESTRAN) 4 g packet, Take 1 packet (4 g total) by mouth daily. .  furosemide (LASIX) 20 MG tablet, Take 1 tablet (20 mg total) by mouth daily. Marland Kitchen  losartan (COZAAR) 100 MG tablet, Take 1 tablet  (100 mg total) by mouth daily. .  metoprolol succinate (TOPROL-XL) 25 MG 24 hr tablet, Take 1 tablet (25 mg total) by mouth daily.  Current Outpatient Medications (Respiratory):  .  fexofenadine (ALLEGRA) 180 MG tablet, Take 180 mg by mouth daily.  Current Outpatient Medications (Analgesics):  .  acetaminophen (TYLENOL) 650 MG CR tablet, Take 650 mg by mouth every 8 (eight) hours as needed for pain. Marland Kitchen  aspirin  EC 325 MG tablet, Take 325 mg by mouth daily.  Current Outpatient Medications (Hematological):  Marland Kitchen  IRON PO, Take 65 mg by mouth 3 (three) times a week.  Current Outpatient Medications (Other):  Marland Kitchen  ALPRAZolam (XANAX) 0.5 MG tablet, Take 1 tablet (0.5 mg total) by mouth daily as needed for anxiety. .  dicyclomine (BENTYL) 10 MG capsule, Take 1 capsule by mouth twice daily .  famotidine (PEPCID) 20 MG tablet, Take 1 tablet (20 mg total) by mouth daily. Marland Kitchen  loperamide (IMODIUM) 2 MG capsule, Take by mouth as needed for diarrhea or loose stools. .  potassium chloride SA (KLOR-CON) 20 MEQ tablet, Take 1 tablet (20 mEq total) by mouth daily. .  Vitamin D, Ergocalciferol, (DRISDOL) 1.25 MG (50000 UT) CAPS capsule, Take 1 capsule by mouth once a week   Reviewed prior external information including notes and imaging from  primary care provider As well as notes that were available from care everywhere and other healthcare systems.  Past medical history, social, surgical and family history all reviewed in electronic medical record.  No pertanent information unless stated regarding to the chief complaint.   Review of Systems:  No headache, visual changes, nausea, vomiting, diarrhea, constipation, dizziness, abdominal pain, skin rash, fevers, chills, night sweats, weight loss, swollen lymph nodes, body aches, joint swelling, chest pain, shortness of breath, mood changes. POSITIVE muscle aches, body aches  Objective    General: No apparent distress alert and oriented x3 mood and affect normal,  dressed appropriately.  Patient ambulates with the aid of a walker.  Short steps taken.  Moderate to severe tenderness noted over the greater trochanteric area.  Decreased range of motion of the hips bilaterally.  After verbal consent prepped with alcohol swabs and with a 21-gauge 2 inch needle injected into the right greater trochanteric area with a total of 1 cc of 0.5% Marcaine and 1 cc of Kenalog 40 mg/mL no blood loss.  Postinjection instructions given.  After verbal consent patient was prepped with alcohol swabs and with a 21-gauge 2 inch needle injected into the left greater trochanteric area with a total of 1 cc of 0.5% Marcaine and 1 cc of Kenalog 40 mg/mL.  No blood loss.  Band-Aid placed.  Postinjection instructions given   Impression and Recommendations:     This case required medical decision making of moderate complexity. The above documentation has been reviewed and is accurate and complete Lyndal Pulley, DO       Note: This dictation was prepared with Dragon dictation along with smaller phrase technology. Any transcriptional errors that result from this process are unintentional.

## 2020-03-03 NOTE — Addendum Note (Signed)
Addended by: Lucila Maine on: 03/03/2020 03:22 PM   Modules accepted: Orders

## 2020-03-03 NOTE — Assessment & Plan Note (Signed)
Chronic problem.  Bilateral injections given again.  Secondary to patient's age and comorbidities is a social determinants of health injections seem to be the safest aspect.  Once again I do feel that there is a possibility of for some lumbar radiculopathy but would not change management if we get further work-up.  Discussed which activities to doing which wants to avoid.  Patient is to increase activity slowly again.  We can continue to do this every 8 weeks if necessary.

## 2020-03-04 ENCOUNTER — Other Ambulatory Visit: Payer: Self-pay | Admitting: Family Medicine

## 2020-03-05 ENCOUNTER — Ambulatory Visit: Payer: Medicare Other

## 2020-03-05 DIAGNOSIS — I10 Essential (primary) hypertension: Secondary | ICD-10-CM

## 2020-03-05 DIAGNOSIS — R739 Hyperglycemia, unspecified: Secondary | ICD-10-CM

## 2020-03-05 NOTE — Patient Instructions (Signed)
Visit Information  Goals Addressed            This Visit's Progress   . Chronic Care Management       CARE PLAN ENTRY  Current Barriers:  . Chronic Disease Management support, education, and care coordination needs related to hypertension, irritable bowel syndrome, hyperlipidemia, GERD  Clinical Goal(s): Over the next 180 days, patient will:  . Work with the care management team to address educational, disease management, and care coordination needs  . Begin or continue self health monitoring activities as directed today  . Call provider office for new or worsened signs and symptoms  . Call care management team with questions or concerns . Maintain blood pressure less than 140/80  Interventions:  . Evaluation of current treatment plans and patient's adherence to plan as established by provider . Assessed patient understanding of disease states . Assessed patient's education and care coordination needs . Provided disease specific education to patient   Telephone follow up appointment with pharmacy team member scheduled for: 09/03/20 at 10:30 AM       Ms. Sammis was given information about Chronic Care Management services today including:  1. CCM service includes personalized support from designated clinical staff supervised by her physician, including individualized plan of care and coordination with other care providers 2. 24/7 contact phone numbers for assistance for urgent and routine care needs. 3. Standard insurance, coinsurance, copays and deductibles apply for chronic care management only during months in which we provide at least 20 minutes of these services. Most insurances cover these services at 100%, however patients may be responsible for any copay, coinsurance and/or deductible if applicable. This service may help you avoid the need for more expensive face-to-face services. 4. Only one practitioner may furnish and bill the service in a calendar month. 5. The patient  may stop CCM services at any time (effective at the end of the month) by phone call to the office staff.  Patient agreed to services and verbal consent obtained.   The patient verbalized understanding of instructions provided today and agreed to receive a mailed copy of patient instruction and/or educational materials. Telephone follow up appointment with pharmacy team member scheduled for: 09/03/20  Doristine Section [phone]

## 2020-03-05 NOTE — Chronic Care Management (AMB) (Signed)
Chronic Care Management Pharmacy  Name: Susan [name]  MRN: [id number] DOB: 02-26-1924  Chief Complaint/ HPI  Susan [name],  86 y.o. , female presents for their Initial CCM visit with the clinical pharmacist via telephone due to COVID-19 Pandemic.  PCP : Flossie Buffy, NP  Their chronic conditions include: Hypertension, irritable bowel syndrome, hyperlipidemia, GERD  Office Visits: 01/21/20: Patient presented to [facility] for ear pain. Patient reported a constant feeling of fullness in her left ear over the past 1-4 weeks which has affected her hearing. No medication changes made, patient referred to ENT.  12/31/19: [facility]. BP in clinic 110/60. Atorvastatin and naproxen were discontinued.   Consult Visits: 02/11/20: patient referred to Dr. Melony Overly (ENT) for hearing loss. Patient found to have impacted cerumen and sore that was thought to be related to hearing aid use.  01/14/20: Patient presented to Dr. Antony Contras (neurology) for speech difficulty/memory loss. No reversible causes of memory loss found. Difficulties not thought to be related to Alzheimer's, patient encouraged to stay mentally stimulated.   Medications: Outpatient Encounter Medications as of 03/05/2020  Medication Sig Note  . acetaminophen (TYLENOL) 650 MG CR tablet Take 650 mg by mouth every 8 (eight) hours as needed for pain.   Marland Kitchen ALPRAZolam (XANAX) 0.5 MG tablet Take 1 tablet (0.5 mg total) by mouth daily as needed for anxiety.   Marland Kitchen amLODipine (NORVASC) 5 MG tablet Take 1 tablet (5 mg total) by mouth at bedtime.   Marland Kitchen aspirin EC 325 MG tablet Take 325 mg by mouth daily.   . cholestyramine (QUESTRAN) 4 g packet Take 1 packet (4 g total) by mouth daily. (Patient taking differently: Take 4 g by mouth every other day. )   . dicyclomine (BENTYL) 10 MG capsule Take 1 capsule by mouth twice daily 01/21/2020: Pt taking 1 daily  . famotidine (PEPCID) 20 MG tablet  Take 1 tablet (20 mg total) by mouth daily.   . fexofenadine (ALLEGRA) 180 MG tablet Take 180 mg by mouth daily.   . furosemide (LASIX) 20 MG tablet Take 1 tablet (20 mg total) by mouth daily.   . IRON PO Take 65 mg by mouth 3 (three) times a week.   . loperamide (IMODIUM) 2 MG capsule Take by mouth as needed for diarrhea or loose stools.   Marland Kitchen losartan (COZAAR) 100 MG tablet Take 1 tablet (100 mg total) by mouth daily.   . metoprolol succinate (TOPROL-XL) 25 MG 24 hr tablet Take 1 tablet (25 mg total) by mouth daily.   . potassium chloride SA (KLOR-CON) 20 MEQ tablet Take 1 tablet (20 mEq total) by mouth daily.   . Vitamin D, Ergocalciferol, (DRISDOL) 1.25 MG (50000 UNIT) CAPS capsule Take 1 capsule by mouth once a week   . [DISCONTINUED] Vitamin D, Ergocalciferol, (DRISDOL) 1.25 MG (50000 UT) CAPS capsule Take 1 capsule by mouth once a week    No facility-administered encounter medications on file as of 03/05/2020.     Current Diagnosis/Assessment:  Goals Addressed            This Visit's Progress   . Chronic Care Management       CARE PLAN ENTRY  Current Barriers:  . Chronic Disease Management support, education, and care coordination needs related to hypertension, irritable bowel syndrome, hyperlipidemia, GERD  Clinical Goal(s): Over the next 180 days, patient will:  . Work with the care management team to address educational, disease management, and care  coordination needs  . Begin or continue self health monitoring activities as directed today  . Call provider office for new or worsened signs and symptoms  . Call care management team with questions or concerns . Maintain blood pressure less than 140/80  Interventions:  . Evaluation of current treatment plans and patient's adherence to plan as established by provider . Assessed patient understanding of disease states . Assessed patient's education and care coordination needs . Provided disease specific education to patient     Telephone follow up appointment with pharmacy team member scheduled for: 09/03/20 at 10:30 AM       Hypertension   Office blood pressures are  BP Readings from Last 3 Encounters:  01/21/20 104/68  01/14/20 (!) 148/76  01/07/20 122/70   CMP Latest Ref Rng & Units 12/31/2019 08/15/2019 12/24/2018  Glucose 70 - 99 mg/dL 95 99 109(H)  BUN 6 - 23 mg/dL 15 20 10   Creatinine 0.40 - 1.20 mg/dL 0.56 0.63 0.55  Sodium 135 - 145 mEq/L 141 145 140  Potassium 3.5 - 5.1 mEq/L 4.8 4.9 4.1  Chloride 96 - 112 mEq/L 107 110 101  CO2 19 - 32 mEq/L 28 27 30   Calcium 8.4 - 10.5 mg/dL 9.1 9.4 9.8  Total Protein 6.0 - 8.3 g/dL - 6.5 -  Total Bilirubin 0.2 - 1.2 mg/dL - 0.3 -  Alkaline Phos 39 - 117 U/L - 72 -  AST 0 - 37 U/L - 23 -  ALT 0 - 35 U/L - 14 -    Patient has failed these meds in the past: n/a Patient is currently controlled on the following medications:   Amlodipine 5 mg at bedtime   Furosemide 20 mg daily  Losartan 100 mg daily  Metoprolol XL 25 mg daily  Patient checks BP at home weekly.   Patient home BP readings are ranging: Systolic [id number]. Typically 120-130s We discussed: Patient finds it hard to swallow amlodipine, sticks to throat back of throat. Informed patient she can crush and mix amlodipine in water, pudding, applesauce, etc and see if that helps swallow it. Continues to have leg swelling which she does not feel has worsened due to amlodipine. Denies symptoms of headaches or dizziness. She does endorse some unstableness, but reports this is an ongoing issue due to her hip.  Patient appears to be on metoprolol primarily for HTN. Patient denies any cardiac history that would be an indication for metoprolol use.  Plan  Recommend discontinuing metoprolol due to lack of strong indication, minimal impact on BP, and to reduce polypharmacy.   Hyperlipidemia   History of CVA 2015   Lipid Panel     Component Value Date/Time   CHOL 159 08/15/2019 1129   TRIG 142.0 08/15/2019  1129   HDL 74.40 08/15/2019 1129   CHOLHDL 2 08/15/2019 1129   VLDL 28.4 08/15/2019 1129   LDLCALC 56 08/15/2019 1129   LDLDIRECT 102.0 05/01/2018 1442     The ASCVD Risk score (Goff DC Jr., et al., 2013) failed to calculate for the following reasons:   The 2013 ASCVD risk score is only valid for ages 92 to 56   The patient has a prior MI or stroke diagnosis   Patient has failed these meds in past: atorvastatin Patient is currently controlled on the following medications: Aspirin 325 mg daily  We discussed: Patient reports she continues to have frequent and easy bruising. She denies blood in urine or stool.   Plan  Continue current medications  IBS   Patient has failed these meds in past: n/a Patient is currently controlled on the following medications:   Cholestyramine 4g daily (q48hr)  Dicyclomine 10 mg BID (takes daily)  Loperamide 2 mg PRN  Plan  Continue current medications  GERD   Patient has failed these meds in past: n/a Patient is currently controlled on the following medications: Famotidine 20 mg daily  We discussed: Patient's reflux symptoms vastly improved. She has not had symptoms since starting on famotidine 20 mg daily. We discussed common triggers for reflux.   Plan  Continue current medications. Did advice patient she can decrease dosing frequency of famotidine if her reflux symptoms remain stable, but will continue on this dose at this time.    Misc/OTC   APAP 650 mg CR q8hr PRN -takes two tablets 2-3 times daily  Alprazolam 0.5 mg daily PRN - takes most nights.  Fexofenadine 180 mg daily  Iron 65 mg three times weekly  KCl 20 mEq daily change  Vitamin D 50,000 units weekly  We discussed: Patient interested in decreasing/discontinuing potassium supplement as her potassium has been WNL.   Plan  Recommend decreasing potassium to q48hr dosing and monitoring BMP to reduce polypharmacy.   Vaccines   Reviewed and discussed patient's  vaccination history.    Immunization History  Administered Date(s) Administered  . Fluad Quad(high Dose 65+) 08/15/2019  . Influenza, High Dose Seasonal PF 09/07/2017, 09/11/2018  . Moderna SARS-COVID-2 Vaccination 12/20/2019, 01/20/2020  . Pneumococcal Conjugate-13 05/16/2017   Medication Management   Pt uses [facility] for all medications. Patient has [employer] Part D insurance. She endorses adherence to her chronic medications.   Follow up: 6 months   Doristine Section [phone]

## 2020-03-10 DIAGNOSIS — Z1159 Encounter for screening for other viral diseases: Secondary | ICD-10-CM | POA: Diagnosis not present

## 2020-03-10 DIAGNOSIS — Z20828 Contact with and (suspected) exposure to other viral communicable diseases: Secondary | ICD-10-CM | POA: Diagnosis not present

## 2020-03-17 DIAGNOSIS — Z1159 Encounter for screening for other viral diseases: Secondary | ICD-10-CM | POA: Diagnosis not present

## 2020-03-17 DIAGNOSIS — Z20828 Contact with and (suspected) exposure to other viral communicable diseases: Secondary | ICD-10-CM | POA: Diagnosis not present

## 2020-03-22 ENCOUNTER — Ambulatory Visit: Payer: Medicare Other | Admitting: Neurology

## 2020-03-24 DIAGNOSIS — Z20828 Contact with and (suspected) exposure to other viral communicable diseases: Secondary | ICD-10-CM | POA: Diagnosis not present

## 2020-03-24 DIAGNOSIS — Z1159 Encounter for screening for other viral diseases: Secondary | ICD-10-CM | POA: Diagnosis not present

## 2020-03-29 ENCOUNTER — Other Ambulatory Visit: Payer: Self-pay | Admitting: Nurse Practitioner

## 2020-03-29 DIAGNOSIS — F418 Other specified anxiety disorders: Secondary | ICD-10-CM

## 2020-03-31 DIAGNOSIS — Z20828 Contact with and (suspected) exposure to other viral communicable diseases: Secondary | ICD-10-CM | POA: Diagnosis not present

## 2020-03-31 DIAGNOSIS — Z1159 Encounter for screening for other viral diseases: Secondary | ICD-10-CM | POA: Diagnosis not present

## 2020-04-07 DIAGNOSIS — Z20828 Contact with and (suspected) exposure to other viral communicable diseases: Secondary | ICD-10-CM | POA: Diagnosis not present

## 2020-04-07 DIAGNOSIS — Z1159 Encounter for screening for other viral diseases: Secondary | ICD-10-CM | POA: Diagnosis not present

## 2020-04-08 ENCOUNTER — Ambulatory Visit (INDEPENDENT_AMBULATORY_CARE_PROVIDER_SITE_OTHER): Payer: Medicare Other | Admitting: Neurology

## 2020-04-08 ENCOUNTER — Other Ambulatory Visit: Payer: Self-pay

## 2020-04-08 VITALS — BP 167/80 | HR 95 | Temp 96.6°F | Wt 107.6 lb

## 2020-04-08 DIAGNOSIS — G3184 Mild cognitive impairment, so stated: Secondary | ICD-10-CM

## 2020-04-08 NOTE — Patient Instructions (Addendum)
I had a long discussion with the patient regards her mild memory and cognitive difficulties from mild cognitive impairment which appears to be stable.  I recommend she continue increasing participation in cognitively challenging activities like solving crossword puzzles, playing bridge, sudoku.  We also discussed memory compensation strategies.  Trial of Prevagen 10 mg daily to help improve memory.  She has had trouble tolerating fish oil in the past.  Continue aspirin for stroke prevention and strict control of hypertension with blood pressure goal below 130/90 and lipids with LDL cholesterol goal below 70 mg percent.  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  She will return for follow-up in the future in 6 months or call earlier if necessary.  Memory Compensation Strategies  1. Use "WARM" strategy.  W= write it down  A= associate it  R= repeat it  M= make a mental note  2.   You can keep a Social worker.  Use a 3-ring notebook with sections for the following: calendar, important names and phone numbers,  medications, doctors' names/phone numbers, lists/reminders, and a section to journal what you did  each day.   3.    Use a calendar to write appointments down.  4.    Write yourself a schedule for the day.  This can be placed on the calendar or in a separate section of the Memory Notebook.  Keeping a  regular schedule can help memory.  5.    Use medication organizer with sections for each day or morning/evening pills.  You may need help loading it  6.    Keep a basket, or pegboard by the door.  Place items that you need to take out with you in the basket or on the pegboard.  You may also want to  include a message board for reminders.  7.    Use sticky notes.  Place sticky notes with reminders in a place where the task is performed.  For example: " turn off the  stove" placed by the stove, "lock  the door" placed on the door at eye level, " take your medications" on  the bathroom mirror or by the place where you normally take your medications.  8.    Use alarms/timers.  Use while cooking to remind yourself to check on food or as a reminder to take your medicine, or as a  reminder to make a call, or as a reminder to perform another task, etc.

## 2020-04-08 NOTE — Progress Notes (Signed)
[facility] [address] [place]. [place] [address] [phone]       OFFICE FOLLOW UP VISIT NOTE  [name] Date of Birth:  07-Feb-1924 Medical Record Number:  [id number]   Referring MD: Wilfred Lacy, NP  Reason for Referral: Speech difficulty and TIA  HPI: Initial consult 01/14/2020 Susan [name] is a pleasant 86 year old Caucasian lady seen today for initial office consultation visit accompanied by her daughter Susan [name].  History is obtained from them and review of referral notes and electronic medical records.  No brain specific imaging films available for review.  Susan [name] is a pleasant 86 year old Caucasian lady with past medical history of hypertension and gastroesophageal reflux disease who has noticed for the last several years that she is having some intermittent word finding difficulties and at times she cannot complete her sentences.  This seems to be getting worse the last 5 months or so.  She is still able to complete sentences but had to think about the words.  She is also having difficulty remembering some recent information.  She still quite active and lives in independent living facility.  She can write her own checks and pays the bills and makes her grocery list.  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  She is not driving.  She is still ambulating well with uses a wheeled walker and has had no falls or injuries.  She is worried she may have had a stroke or TIA.  She had an MRI scan of the brain ordered at [place] but the patient called and canceled appointment since she needed more clarification for why it was necessary.  She denies any prior history of strokes TIAs seizures or significant neurological problems.  She does have strong family history of dementia in her mother as well as grandmother.  She is worried about Alzheimer's.  She had some recent lab work done basic metabolic panel in [id number] which is normal.  TSH on  08/15/2019 was 2.6 and LDL was 56 and A1c 5.7.  No recent brain imaging films available.  She has not had any lab work for reversible causes of cognitive impairment or EEG study. Update 04/08/2020 : She returns for follow-up after last visit 3 months ago.  She states her short-term memory and cognitive difficulties remain about the same.  She is not able to tolerate fish oil due to diarrhea and stopped it.  She had lab work for reversible causes of memory loss at last visit all of which were normal.  EEG on 02/02/2020 showed mild bifrontal sharp waves without epileptiform activity.  Carotid ultrasound and transcranial Doppler studies done on 01/27/2020 were both unremarkable.  Patient states that she does keep herself quite busy and plays sudoku and Sowles 5 crossword puzzles a day.  She lives in assisted living apartment [facility] and is quite socially active there and exercises also regularly.  She does play dominoes and bingo daily.  She does her own laundry and keeps her own room tidy.  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  She does not want to consider Botox injection for this.  She has no new complaints today. ROS:   14 system review of systems is positive for speech difficulty, memory difficulties, involuntary movements and all other systems negative PMH:  Past Medical History:  Diagnosis Date  . Actinic keratosis, hx of    chest wall (2012)  . Allergic  rhinitis   . Anxiety   . Arthritis    "spine, hips" (01/30/2017)  . Bursitis   . Cellulitis of right foot 01/30/2017  . Colon polyps   . Diverticulosis   . Esophageal dysmotilities 10/16/2017  . Fibromyalgia    "qwhere" (01/30/2017)  . Gallstones   . GERD (gastroesophageal reflux disease)   . High cholesterol   . History of kidney stones   . History of stomach ulcers   . Hx of squamous cell carcinoma excision    right mandible (2011), right arm (2014)  . Hypertension    . IBS (irritable bowel syndrome)   . Osteoarthritis   . Pneumonia 2000s X 2   "twice" (01/30/2017)  . Pyloric stenosis   . Stroke [facility]) 10/2014   denies residual on 01/30/2017    Social History:  Social History   Socioeconomic History  . Marital status: Widowed    Spouse name: Not on file  . Number of children: 4  . Years of education: Not on file  . Highest education level: Not on file  Occupational History  . Occupation: retired  Tobacco Use  . Smoking status: Never Smoker  . Smokeless tobacco: Never Used  Substance and Sexual Activity  . Alcohol use: Yes    Comment: glass of red wine maybe once week  . Drug use: No  . Sexual activity: Not on file  Other Topics Concern  . Not on file  Social History Narrative  . Not on file   Social Determinants of Health   Financial Resource Strain:   . Difficulty of Paying Living Expenses:   Food Insecurity:   . Worried About Charity fundraiser in the Last Year:   . Arboriculturist in the Last Year:   Transportation Needs:   . Film/video editor (Medical):   Marland Kitchen Lack of Transportation (Non-Medical):   Physical Activity:   . Days of Exercise per Week:   . Minutes of Exercise per Session:   Stress:   . Feeling of Stress :   Social Connections:   . Frequency of Communication with Friends and Family:   . Frequency of Social Gatherings with Friends and Family:   . Attends Religious Services:   . Active Member of Clubs or Organizations:   . Attends Archivist Meetings:   Marland Kitchen Marital Status:   Intimate Partner Violence:   . Fear of Current or Ex-Partner:   . Emotionally Abused:   Marland Kitchen Physically Abused:   . Sexually Abused:     Medications:   Current Outpatient Medications on File Prior to Visit  Medication Sig Dispense Refill  . acetaminophen (TYLENOL) 650 MG CR tablet Take 650 mg by mouth every 8 (eight) hours as needed for pain.    Marland Kitchen ALPRAZolam (XANAX) 0.5 MG tablet Take 1 tablet (0.5 mg total) by mouth daily  as needed. Need office visit for additional refills 30 tablet 1  . amLODipine (NORVASC) 5 MG tablet Take 1 tablet (5 mg total) by mouth at bedtime. 90 tablet 3  . aspirin EC 325 MG tablet Take 325 mg by mouth daily.    . cholestyramine (QUESTRAN) 4 g packet Take 1 packet (4 g total) by mouth daily as needed. Need office visit for additional refills 60 each 1  . dicyclomine (BENTYL) 10 MG capsule Take 1 capsule by mouth twice daily 180 capsule 0  . famotidine (PEPCID) 20 MG tablet Take 1 tablet (20 mg total) by mouth daily. [place]  tablet 3  . fexofenadine (ALLEGRA) 180 MG tablet Take 180 mg by mouth daily.    . furosemide (LASIX) 20 MG tablet Take 1 tablet (20 mg total) by mouth daily. 90 tablet 1  . IRON PO Take 65 mg by mouth 3 (three) times a week.    . loperamide (IMODIUM) 2 MG capsule Take by mouth as needed for diarrhea or loose stools.    Marland Kitchen losartan (COZAAR) 100 MG tablet Take 1 tablet (100 mg total) by mouth daily. 90 tablet 3  . potassium chloride SA (KLOR-CON) 20 MEQ tablet Take 1 tablet (20 mEq total) by mouth daily. (Patient taking differently: Take 20 mEq by mouth once a week. Takes every saturday) 90 tablet 1  . Vitamin D, Ergocalciferol, (DRISDOL) 1.25 MG (50000 UNIT) CAPS capsule Take 1 capsule by mouth once a week 12 capsule 0   No current facility-administered medications on file prior to visit.    Allergies:   Allergies  Allergen Reactions  . Butrans [Buprenorphine] Other (See Comments)    Severe chest pains  . Codeine Other (See Comments)    Severe Chest pains  . Morphine And Related Other (See Comments)    Severe chest pains    Physical Exam General: Frail elderly Caucasian lady seated, in no evident distress Head: head normocephalic and atraumatic.   Neck: supple with no carotid or supraclavicular bruits Cardiovascular: regular rate and rhythm, no murmurs Musculoskeletal: Mild kyphoscoliosis Skin:  no rash/petichiae Vascular:  Normal pulses all  extremities  Neurologic Exam Mental Status: Awake and fully alert. Oriented to place and time. Recent and remote memory intact. Attention span, concentration and fund of knowledge appropriate. Mood and affect appropriate.  No aphasia apraxia or dysarthria.  3 word recall is normal at 3/3.  Clock drawing is 4/4.  She is able to name 11 animals which can walk on 4 legs. Cranial Nerves: Fundoscopic exam reveals sharp disc margins. Pupils equal, briskly reactive to light. Extraocular movements full without nystagmus.  She has frequent essential blepharospasm with nearly constant closing of her eyelids off and on.  Visual fields full to confrontation. Hearing significantly diminished in the left ear.. Facial sensation intact. Face, tongue, palate moves normally and symmetrically.  Motor: Normal bulk and tone. Normal strength in all tested extremity muscles. Sensory.: intact to touch , pinprick , position and vibratory sensation.  Coordination: Rapid alternating movements normal in all extremities. Finger-to-nose and heel-to-shin performed accurately bilaterally. Gait and Station: Arises from chair without difficulty. Stance is stooped.  Uses a wheeled walker.. Gait mildly unsteady..  Unable to tandem walk.  Reflexes: 1+ and symmetric. Toes downgoing.       ASSESSMENT: 86 year old Caucasian lady with subacute mild word finding and memory difficulties  likely related to age-appropriate mild, memory impairment.  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     PLAN: I had a long discussion with the patient regards her mild memory and cognitive difficulties from mild cognitive impairment which appears to be stable.  I recommend she continue increasing participation in cognitively challenging activities like solving crossword puzzles, playing bridge, sudoku.  We also discussed memory compensation strategies.  Trial of Prevagen 10 mg daily  to help improve memory.  She has had trouble tolerating fish oil in the past.  Continue aspirin for stroke prevention and strict control of hypertension with blood pressure goal below 130/90 and lipids with LDL cholesterol goal below 70 mg percent.  She continues  to refuse Botox treatment for her essential blepharospasm but will call me in case she changes her mind in the future.  She will return for follow-up in the future in 6 months or call earlier if necessary.  Greater than 50% time during this 30-minute  visit was spent on counseling and coordination of care about her mild speech difficulties, mild cognitive impairment and blepharospasm and answering questions  .  Antony Contras, MD Note: This document was prepared with digital dictation and possible smart phrase technology. Any transcriptional errors that result from this process are unintentional.

## 2020-04-14 DIAGNOSIS — Z20828 Contact with and (suspected) exposure to other viral communicable diseases: Secondary | ICD-10-CM | POA: Diagnosis not present

## 2020-04-14 DIAGNOSIS — Z1159 Encounter for screening for other viral diseases: Secondary | ICD-10-CM | POA: Diagnosis not present

## 2020-04-20 ENCOUNTER — Ambulatory Visit: Payer: Medicare Other | Admitting: Neurology

## 2020-04-21 DIAGNOSIS — Z20828 Contact with and (suspected) exposure to other viral communicable diseases: Secondary | ICD-10-CM | POA: Diagnosis not present

## 2020-04-21 DIAGNOSIS — Z1159 Encounter for screening for other viral diseases: Secondary | ICD-10-CM | POA: Diagnosis not present

## 2020-04-29 ENCOUNTER — Other Ambulatory Visit: Payer: Self-pay | Admitting: Internal Medicine

## 2020-04-29 ENCOUNTER — Other Ambulatory Visit: Payer: Self-pay | Admitting: Nurse Practitioner

## 2020-04-29 DIAGNOSIS — K529 Noninfective gastroenteritis and colitis, unspecified: Secondary | ICD-10-CM

## 2020-04-29 DIAGNOSIS — K582 Mixed irritable bowel syndrome: Secondary | ICD-10-CM

## 2020-05-05 ENCOUNTER — Ambulatory Visit: Payer: Medicare Other | Admitting: Family Medicine

## 2020-05-05 DIAGNOSIS — Z1159 Encounter for screening for other viral diseases: Secondary | ICD-10-CM | POA: Diagnosis not present

## 2020-05-05 DIAGNOSIS — Z20828 Contact with and (suspected) exposure to other viral communicable diseases: Secondary | ICD-10-CM | POA: Diagnosis not present

## 2020-05-05 NOTE — Progress Notes (Deleted)
[name] [address] [place] Phone: [phone] Subjective:    I'm seeing this patient by the request  of:  [name], Susan Brooke, NP  CC:   [id number]  Susan [name] is a 86 y.o. female coming in with complaint of ***  Onset-  Location Duration-  Character- Aggravating factors- Reliving factors-  Therapies tried-  Severity-     Past Medical History:  Diagnosis Date  . Actinic keratosis, hx of    chest wall (2012)  . Allergic rhinitis   . Anxiety   . Arthritis    "spine, hips" (01/30/2017)  . Bursitis   . Cellulitis of right foot 01/30/2017  . Colon polyps   . Diverticulosis   . Esophageal dysmotilities 10/16/2017  . Fibromyalgia    "qwhere" (01/30/2017)  . Gallstones   . GERD (gastroesophageal reflux disease)   . High cholesterol   . History of kidney stones   . History of stomach ulcers   . Hx of squamous cell carcinoma excision    right mandible (2011), right arm (2014)  . Hypertension   . IBS (irritable bowel syndrome)   . Osteoarthritis   . Pneumonia 2000s X 2   "twice" (01/30/2017)  . Pyloric stenosis   . Stroke [facility]) 10/2014   denies residual on 01/30/2017   Past Surgical History:  Procedure Laterality Date  . APPENDECTOMY    . CARDIOVASCULAR STRESS TEST  04/2016   nuclear cardiolite stress test done in [place] (no ischemia, no LVH, no LV systolic function)  . CATARACT EXTRACTION W/ INTRAOCULAR LENS  IMPLANT, BILATERAL Bilateral   . CHOLECYSTECTOMY OPEN    . COLONOSCOPY  2009, 2014  . DIAGNOSTIC MAMMOGRAM  2009, 2012  . TONSILLECTOMY     Social History   Socioeconomic History  . Marital status: Widowed    Spouse name: Not on file  . Number of children: 4  . Years of education: Not on file  . Highest education level: Not on file  Occupational History  . Occupation: retired  Tobacco Use  . Smoking status: Never Smoker  . Smokeless tobacco: Never Used  Substance and Sexual Activity    . Alcohol use: Yes    Comment: glass of red wine maybe once week  . Drug use: No  . Sexual activity: Not on file  Other Topics Concern  . Not on file  Social History Narrative  . Not on file   Social Determinants of Health   Financial Resource Strain:   . Difficulty of Paying Living Expenses:   Food Insecurity:   . Worried About Charity fundraiser in the Last Year:   . Arboriculturist in the Last Year:   Transportation Needs:   . Film/video editor (Medical):   Marland Kitchen Lack of Transportation (Non-Medical):   Physical Activity:   . Days of Exercise per Week:   . Minutes of Exercise per Session:   Stress:   . Feeling of Stress :   Social Connections:   . Frequency of Communication with Friends and Family:   . Frequency of Social Gatherings with Friends and Family:   . Attends Religious Services:   . Active Member of Clubs or Organizations:   . Attends Archivist Meetings:   Marland Kitchen Marital Status:    Allergies  Allergen Reactions  . Butrans [Buprenorphine] Other (See Comments)    Severe chest pains  . Codeine Other (See Comments)    Severe Chest  pains  . Morphine And Related Other (See Comments)    Severe chest pains   Family History  Problem Relation Age of Onset  . Stroke Mother   . Heart attack Mother   . Stroke Father   . Heart attack Father   . Heart attack Maternal Grandmother   . Stroke Maternal Grandmother   . Stroke Paternal Grandmother   . Heart attack Paternal Grandmother   . Heart attack Maternal Grandfather   . Stroke Maternal Grandfather   . Stroke Paternal Grandfather   . Heart attack Paternal Grandfather   . Colon cancer Neg Hx   . Pancreatic cancer Neg Hx   . Stomach cancer Neg Hx   . Esophageal cancer Neg Hx      Current Outpatient Medications (Cardiovascular):  .  amLODipine (NORVASC) 5 MG tablet, Take 1 tablet (5 mg total) by mouth at bedtime. .  cholestyramine (QUESTRAN) 4 g packet, Take 1 packet (4 g total) by mouth daily as  needed. Need office visit for additional refills .  furosemide (LASIX) 20 MG tablet, Take 1 tablet (20 mg total) by mouth daily. Marland Kitchen  losartan (COZAAR) 100 MG tablet, Take 1 tablet (100 mg total) by mouth daily.  Current Outpatient Medications (Respiratory):  .  fexofenadine (ALLEGRA) 180 MG tablet, Take 180 mg by mouth daily.  Current Outpatient Medications (Analgesics):  .  acetaminophen (TYLENOL) 650 MG CR tablet, Take 650 mg by mouth every 8 (eight) hours as needed for pain. Marland Kitchen  aspirin EC 325 MG tablet, Take 325 mg by mouth daily.  Current Outpatient Medications (Hematological):  Marland Kitchen  IRON PO, Take 65 mg by mouth 3 (three) times a week.  Current Outpatient Medications (Other):  Marland Kitchen  ALPRAZolam (XANAX) 0.5 MG tablet, Take 1 tablet (0.5 mg total) by mouth daily as needed. Need office visit for additional refills .  dicyclomine (BENTYL) 10 MG capsule, Take 1 capsule by mouth twice daily .  famotidine (PEPCID) 20 MG tablet, Take 1 tablet (20 mg total) by mouth daily. Marland Kitchen  loperamide (IMODIUM) 2 MG capsule, Take by mouth as needed for diarrhea or loose stools. .  potassium chloride SA (KLOR-CON) 20 MEQ tablet, Take 1 tablet (20 mEq total) by mouth daily. (Patient taking differently: Take 20 mEq by mouth once a week. Takes every saturday) .  Vitamin D, Ergocalciferol, (DRISDOL) 1.25 MG (50000 UNIT) CAPS capsule, Take 1 capsule by mouth once a week   Reviewed prior external information including notes and imaging from  primary care provider As well as notes that were available from care everywhere and other healthcare systems.  Past medical history, social, surgical and family history all reviewed in electronic medical record.  No pertanent information unless stated regarding to the chief complaint.   Review of Systems:  No headache, visual changes, nausea, vomiting, diarrhea, constipation, dizziness, abdominal pain, skin rash, fevers, chills, night sweats, weight loss, swollen lymph nodes, body  aches, joint swelling, chest pain, shortness of breath, mood changes. POSITIVE muscle aches  Objective  There were no vitals taken for this visit.   General: No apparent distress alert and oriented x3 mood and affect normal, dressed appropriately.  HEENT: Pupils equal, extraocular movements intact  Respiratory: Patient's speak in full sentences and does not appear short of breath  Cardiovascular: No lower extremity edema, non tender, no erythema  Neuro: Cranial nerves II through XII are intact, neurovascularly intact in all extremities with 2+ DTRs and 2+ pulses.  Gait normal with good balance  and coordination.  MSK:  Non tender with full range of motion and good stability and symmetric strength and tone of shoulders, elbows, wrist, hip, knee and ankles bilaterally.     Impression and Recommendations:     The above documentation has been reviewed and is accurate and complete Lyndal Pulley, DO       Note: This dictation was prepared with Dragon dictation along with smaller phrase technology. Any transcriptional errors that result from this process are unintentional.

## 2020-05-11 ENCOUNTER — Ambulatory Visit: Payer: Self-pay

## 2020-05-11 DIAGNOSIS — E782 Mixed hyperlipidemia: Secondary | ICD-10-CM

## 2020-05-11 DIAGNOSIS — I10 Essential (primary) hypertension: Secondary | ICD-10-CM

## 2020-05-11 NOTE — Chronic Care Management (AMB) (Signed)
Hypertension assessment was performed for Susan [name], 11/26/2024 on 05/11/2020 by Cristie Hem. The patient's last 3 BP readings were 148/76 on 01/14/2020, 104/68 on 01/21/2020, and 167/80 on 04/08/2020. Since patient's last CPP visit, the following interventions have been made to improve BP control: 04/08/20: Patient presented to Dr. Leonie Man (neurology) for mild cognitive impairment. Cognitive challenges still present but stable. Patient started on Prevagen 10 mg daily.  The patient has not had an ED visit since their last CPP follow up. The patient's current Hypertension and PDC medications are: Amlodipine 5 mg (02/13/20 90DS), losartan 100 mg (02/13/20 90DS).   Patient currently does not have a greater than 5 day gap between last fill.   The patient is currently checking their BP: Once every 1-2 weeks.  Their current BP Before Breakfast 120s-130s. Highest in recollection was 449 systolic. The patient is not currently having low BP <90/60.  The patient is not currently having BP's >180/100, and not currently having an average BP>140/90. I educated patient to inform proper points on checking BP at home such as taking resting blood pressure: sitting quietly for 5 minutes, not within 30 min. of exercising, no talking; sitting with feet flat on the floor and arm level with heart; not drinking caffeine or smoking a cigarette at least 30 min. prior to checking; and making sure to use the right size cuff (the length of the cuff's bladder should be at least equal to 75% of the circumference of the upper arm).  Patient's current diet is:  Stable.  Patient diet response: Avoids adding extra salt, but meals are prepared for her at her assisted living facility. She does not snack on salty foods like potato chips.  Patient's exercise response: Continues to do chair exercises. Has problems with hip and lower spine which limits her overall activity.   Patient's misc comments:  Patient started on Prevagen 10 mg daily, but states she  does not feel much of a difference either way in her memory. Patient asked about getting a new prescription for lomotil. Patient has loperamide and cholestyramine listed on her medication list for diarrhea, but states these do not always resolve her diarrhea. She prefers to keep lomotil on hand in case of severe episodes of diarrhea .               Recommendations:  Recommend new Rx of Lomotil  Recommend Vitamin D level at next office visit to assess ergocalciferol    [place] at [facility]  [phone]

## 2020-05-18 ENCOUNTER — Other Ambulatory Visit: Payer: Self-pay | Admitting: Internal Medicine

## 2020-05-18 ENCOUNTER — Other Ambulatory Visit: Payer: Self-pay | Admitting: Nurse Practitioner

## 2020-05-18 DIAGNOSIS — K582 Mixed irritable bowel syndrome: Secondary | ICD-10-CM

## 2020-05-18 DIAGNOSIS — K2100 Gastro-esophageal reflux disease with esophagitis, without bleeding: Secondary | ICD-10-CM

## 2020-05-19 ENCOUNTER — Emergency Department (HOSPITAL_COMMUNITY)
Admission: EM | Admit: 2020-05-19 | Discharge: 2020-05-19 | Disposition: A | Discharge status: Home / Self Care | Payer: Medicare Other | Attending: Emergency Medicine | Admitting: Emergency Medicine

## 2020-05-19 ENCOUNTER — Emergency Department (HOSPITAL_COMMUNITY): Payer: Medicare Other

## 2020-05-19 ENCOUNTER — Other Ambulatory Visit: Payer: Self-pay

## 2020-05-19 DIAGNOSIS — I639 Cerebral infarction, unspecified: Secondary | ICD-10-CM | POA: Diagnosis not present

## 2020-05-19 DIAGNOSIS — I10 Essential (primary) hypertension: Secondary | ICD-10-CM | POA: Insufficient documentation

## 2020-05-19 DIAGNOSIS — Z8673 Personal history of transient ischemic attack (TIA), and cerebral infarction without residual deficits: Secondary | ICD-10-CM | POA: Insufficient documentation

## 2020-05-19 DIAGNOSIS — R42 Dizziness and giddiness: Secondary | ICD-10-CM | POA: Diagnosis not present

## 2020-05-19 LAB — CBC
HCT: 40.2 % (ref 36.0–46.0)
Hemoglobin: 12.6 g/dL (ref 12.0–15.0)
MCH: 30.2 pg (ref 26.0–34.0)
MCHC: 31.3 g/dL (ref 30.0–36.0)
MCV: 96.4 fL (ref 80.0–100.0)
Platelets: 236 10*3/uL (ref 150–400)
RBC: 4.17 MIL/uL (ref 3.87–5.11)
RDW: 13.2 % (ref 11.5–15.5)
WBC: 5 10*3/uL (ref 4.0–10.5)
nRBC: 0 % (ref 0.0–0.2)

## 2020-05-19 LAB — BASIC METABOLIC PANEL
Anion gap: 12 (ref 5–15)
BUN: 11 mg/dL (ref 8–23)
CO2: 25 mmol/L (ref 22–32)
Calcium: 9 mg/dL (ref 8.9–10.3)
Chloride: 105 mmol/L (ref 98–111)
Creatinine, Ser: 0.74 mg/dL (ref 0.44–1.00)
GFR calc Af Amer: >60 mL/min (ref >60)
GFR calc non Af Amer: >60 mL/min (ref >60)
Glucose, Bld: 107 mg/dL — ABNORMAL HIGH (ref 70–99)
Potassium: 3.8 mmol/L (ref 3.5–5.1)
Sodium: 142 mmol/L (ref 135–145)

## 2020-05-19 MED ORDER — MECLIZINE HCL 25 MG PO TABS: 25.0000 mg | ORAL_TABLET | Freq: Three times a day (TID) | ORAL | 0 refills | Status: DC | PRN

## 2020-05-19 NOTE — ED Provider Notes (Signed)
[facility] Emergency Department Provider Note MRN:  [id number]  Arrival date & time: 05/19/20     Chief Complaint   Dizziness History of Present Illness   Susan [name] is a 86 y.o. year-old female with a history of stroke, hypertension presenting to the ED with chief complaint of dizziness.  Patient explains that she was sitting down about to watch jeopardy at 7 PM when she suddenly felt the sensation of the entire room spinning in circles.  This sensation would not go away and lasted 30 minutes and then gradually resolved.  She denies nausea or vomiting, no vision change, no headache, no chest pain or shortness of breath, no abdominal pain.  Denies recent illness, no recent fever or cough or dysuria.  No numbness or weakness to the arms or legs.  Has never had vertigo before.  Last week did have some issues with cerumen impaction and she has been working on it at home.  Review of Systems  A complete 10 system review of systems was obtained and all systems are negative except as noted in the HPI and PMH.   Patient's Health History    Past Medical History:  Diagnosis Date   Actinic keratosis, hx of    chest wall (2012)   Allergic rhinitis    Anxiety    Arthritis    "spine, hips" (01/30/2017)   Bursitis    Cellulitis of right foot 01/30/2017   Colon polyps    Diverticulosis    Esophageal dysmotilities 10/16/2017   Fibromyalgia    "qwhere" (01/30/2017)   Gallstones    GERD (gastroesophageal reflux disease)    High cholesterol    History of kidney stones    History of stomach ulcers    Hx of squamous cell carcinoma excision    right mandible (2011), right arm (2014)   Hypertension    IBS (irritable bowel syndrome)    Osteoarthritis    Pneumonia 2000s X 2   "twice" (01/30/2017)   Pyloric stenosis    Stroke ([place]) 10/2014   denies residual on 01/30/2017    Past Surgical History:  Procedure Laterality Date   APPENDECTOMY      CARDIOVASCULAR STRESS TEST  04/2016   nuclear cardiolite stress test done in [place] (no ischemia, no LVH, no LV systolic function)   CATARACT EXTRACTION W/ INTRAOCULAR LENS  IMPLANT, BILATERAL Bilateral    CHOLECYSTECTOMY OPEN     COLONOSCOPY  2009, 2014   DIAGNOSTIC MAMMOGRAM  2009, 2012   TONSILLECTOMY      Family History  Problem Relation Age of Onset   Stroke Mother    Heart attack Mother    Stroke Father    Heart attack Father    Heart attack Maternal Grandmother    Stroke Maternal Grandmother    Stroke Paternal Grandmother    Heart attack Paternal Grandmother    Heart attack Maternal Grandfather    Stroke Maternal Grandfather    Stroke Paternal Grandfather    Heart attack Paternal Grandfather    Colon cancer Neg Hx    Pancreatic cancer Neg Hx    Stomach cancer Neg Hx    Esophageal cancer Neg Hx     Social History   Socioeconomic History   Marital status: Widowed    Spouse name: Not on file   Number of children: 4   Years of education: Not on file   Highest education level: Not on file  Occupational History   Occupation: retired  Tobacco Use  Smoking status: Never Smoker   Smokeless tobacco: Never Used  Vaping Use   Vaping Use: Never used  Substance and Sexual Activity   Alcohol use: Yes    Comment: glass of red wine maybe once week   Drug use: No   Sexual activity: Not on file  Other Topics Concern   Not on file  Social History Narrative   Not on file   Social Determinants of Health   Financial Resource Strain:    Difficulty of Paying Living Expenses:   Food Insecurity:    Worried About Charity fundraiser in the Last Year:    Arboriculturist in the Last Year:   Transportation Needs:    Film/video editor (Medical):    Lack of Transportation (Non-Medical):   Physical Activity:    Days of Exercise per Week:    Minutes of Exercise per Session:   Stress:    Feeling of Stress :   Social Connections:     Frequency of Communication with Friends and Family:    Frequency of Social Gatherings with Friends and Family:    Attends Religious Services:    Active Member of Clubs or Organizations:    Attends Archivist Meetings:    Marital Status:   Intimate Partner Violence:    Fear of Current or Ex-Partner:    Emotionally Abused:    Physically Abused:    Sexually Abused:      Physical Exam   Vitals:   05/19/20 2200 05/19/20 2245  BP: (!) 199/84 (!) 198/78  Pulse: 70 67  Resp: 18 15  Temp:  98.1 F (36.7 C)  SpO2: 98% 97%    CONSTITUTIONAL: Well-appearing, NAD NEURO:  Alert and oriented x 3, normal and symmetric strength and sensation, normal coordination, normal speech, no visual field cuts, no neglect, no nystagmus EYES:  eyes equal and reactive, normal extraocular movements ENT/NECK:  no LAD, no JVD CARDIO: Regular rate, well-perfused, normal S1 and S2 PULM:  CTAB no wheezing or rhonchi GI/GU:  normal bowel sounds, non-distended, non-tender MSK/SPINE:  No gross deformities, no edema SKIN:  no rash, atraumatic PSYCH:  Appropriate speech and behavior  *Additional and/or pertinent findings included in MDM below  Diagnostic and Interventional Summary    EKG Interpretation  Date/Time:  Wednesday May 19 2020 20:53:46 EDT Ventricular Rate:  64 PR Interval:    QRS Duration: 117 QT Interval:  443 QTC Calculation: 458 R Axis:   -21 Text Interpretation: Sinus rhythm LVH with secondary repolarization abnormality Anterior ST elevation, probably due to LVH Confirmed by Gerlene Fee [id number]) on 05/19/2020 9:18:17 PM      Labs Reviewed  BASIC METABOLIC PANEL - Abnormal; Notable for the following components:      Result Value   Glucose, Bld 107 (*)    All other components within normal limits  CBC    CT HEAD WO CONTRAST  Final Result      Medications - No data to display   Procedures  /  Critical Care Procedures  ED Course and Medical Decision Making   I have reviewed the triage vital signs, the nursing notes, and pertinent available records from the EMR.  Listed above are laboratory and imaging tests that I personally ordered, reviewed, and interpreted and then considered in my medical decision making (see below for details).      30 minutes of dizziness consistent with vertigo.  Favoring peripheral process given the recent issues with her left ear.  She has a very reassuring neurological exam.  Asymptomatic here in the emergency department for 1 to 2 hours.  Screening labs and CT head and EKG are reassuring.  Patient feels comfortable going back home and keeping a close eye on her symptoms.    Barth Kirks. Sedonia Small, [place] [email or web address]  Final Clinical Impressions(s) / ED Diagnoses     ICD-10-CM   1. Vertigo  R42     ED Discharge Orders         Ordered    meclizine (ANTIVERT) 25 MG tablet  3 times daily PRN     Discontinue  Reprint     05/19/20 2307           Discharge Instructions Discussed with and Provided to Patient:     Discharge Instructions     You were evaluated in the Emergency Department and after careful evaluation, we did not find any emergent condition requiring admission or further testing in the hospital.  Your exam/testing today was overall reassuring.  Please return to the Emergency Department if you experience any worsening of your condition.  We encourage you to follow up with a primary care provider.  Thank you for allowing [place] to be a part of your care.        Maudie Flakes, MD 05/19/20 2308

## 2020-05-19 NOTE — ED Notes (Signed)
Patient verbalizes understanding of discharge instructions. Opportunity for questioning and answers were provided. Armband removed by staff, pt discharged from ED by wheelchair with daughter to take pt home

## 2020-05-19 NOTE — ED Triage Notes (Signed)
Pt coming from home after sitting down watching tv on the couch and becoming dizzy. Pt stated it was difficult to focus vision, but no pain noted. Hx of stroke and TIA. Normal neuro exam. Hypertension noted with systolic in 475V-391B. HR 50s-60s and O2 WDL. Pt alert and oriented. Never fell into floor, stayed on couch while friend called EMS

## 2020-05-19 NOTE — Discharge Instructions (Addendum)
You were evaluated in the Emergency Department and after careful evaluation, we did not find any emergent condition requiring admission or further testing in the hospital. ° °Your exam/testing today was overall reassuring. ° °Please return to the Emergency Department if you experience any worsening of your condition.  We encourage you to follow up with a primary care provider.  Thank you for allowing us to be a part of your care. ° °

## 2020-05-26 ENCOUNTER — Ambulatory Visit: Payer: Self-pay

## 2020-05-26 ENCOUNTER — Ambulatory Visit (INDEPENDENT_AMBULATORY_CARE_PROVIDER_SITE_OTHER): Payer: Medicare Other | Admitting: Family Medicine

## 2020-05-26 ENCOUNTER — Other Ambulatory Visit: Payer: Self-pay

## 2020-05-26 ENCOUNTER — Encounter: Payer: Self-pay | Admitting: Family Medicine

## 2020-05-26 ENCOUNTER — Ambulatory Visit (INDEPENDENT_AMBULATORY_CARE_PROVIDER_SITE_OTHER): Payer: Medicare Other

## 2020-05-26 VITALS — BP 150/90 | HR 70 | Ht 61.0 in | Wt 106.0 lb

## 2020-05-26 DIAGNOSIS — M7061 Trochanteric bursitis, right hip: Secondary | ICD-10-CM

## 2020-05-26 DIAGNOSIS — G8929 Other chronic pain: Secondary | ICD-10-CM | POA: Diagnosis not present

## 2020-05-26 DIAGNOSIS — M545 Low back pain, unspecified: Secondary | ICD-10-CM

## 2020-05-26 DIAGNOSIS — M7062 Trochanteric bursitis, left hip: Secondary | ICD-10-CM

## 2020-05-26 DIAGNOSIS — M25551 Pain in right hip: Secondary | ICD-10-CM | POA: Diagnosis not present

## 2020-05-26 DIAGNOSIS — M25552 Pain in left hip: Secondary | ICD-10-CM | POA: Diagnosis not present

## 2020-05-26 NOTE — Patient Instructions (Addendum)
Xray today See me again in 2 months

## 2020-05-26 NOTE — Progress Notes (Signed)
[facility] [address] [place] Phone: [phone] Subjective:   I Susan [name] am serving as a Education administrator for Dr. Hulan Saas.  This visit occurred during the SARS-CoV-2 public health emergency.  Safety protocols were in place, including screening questions prior to the visit, additional usage of staff PPE, and extensive cleaning of exam room while observing appropriate contact time as indicated for disinfecting solutions.   I'm seeing this patient by the request  of:  Nche, Charlene Brooke, NP  CC: Bilateral hip pain  [id number]   03/03/2020 Chronic problem.  Bilateral injections given again.  Secondary to patient's age and comorbidities is a social determinants of health injections seem to be the safest aspect.  Once again I do feel that there is a possibility of for some lumbar radiculopathy but would not change management if we get further work-up.  Discussed which activities to doing which wants to avoid.  Patient is to increase activity slowly again.  We can continue to do this every 8 weeks if necessary.  Update 05/26/2020 Susan [name] is a 86 y.o. female coming in with complaint of bilateral hip pain. Patient states she feels about the same. Painful with walking. Lower back is also painful.  Patient recently was in the emergency room and had a CT of the head done secondary to a episode of vertigo.  Patient states since then has not had any difficulty.  CT of the head showed that patient did have the chronic infarct that was noted.  Patient denies any numbness, any weakness.     Past Medical History:  Diagnosis Date  . Actinic keratosis, hx of    chest wall (2012)  . Allergic rhinitis   . Anxiety   . Arthritis    "spine, hips" (01/30/2017)  . Bursitis   . Cellulitis of right foot 01/30/2017  . Colon polyps   . Diverticulosis   . Esophageal dysmotilities 10/16/2017  . Fibromyalgia    "qwhere" (01/30/2017)  . Gallstones   .  GERD (gastroesophageal reflux disease)   . High cholesterol   . History of kidney stones   . History of stomach ulcers   . Hx of squamous cell carcinoma excision    right mandible (2011), right arm (2014)  . Hypertension   . IBS (irritable bowel syndrome)   . Osteoarthritis   . Pneumonia 2000s X 2   "twice" (01/30/2017)  . Pyloric stenosis   . Stroke [facility]) 10/2014   denies residual on 01/30/2017   Past Surgical History:  Procedure Laterality Date  . APPENDECTOMY    . CARDIOVASCULAR STRESS TEST  04/2016   nuclear cardiolite stress test done in [place] (no ischemia, no LVH, no LV systolic function)  . CATARACT EXTRACTION W/ INTRAOCULAR LENS  IMPLANT, BILATERAL Bilateral   . CHOLECYSTECTOMY OPEN    . COLONOSCOPY  2009, 2014  . DIAGNOSTIC MAMMOGRAM  2009, 2012  . TONSILLECTOMY     Social History   Socioeconomic History  . Marital status: Widowed    Spouse name: Not on file  . Number of children: 4  . Years of education: Not on file  . Highest education level: Not on file  Occupational History  . Occupation: retired  Tobacco Use  . Smoking status: Never Smoker  . Smokeless tobacco: Never Used  Vaping Use  . Vaping Use: Never used  Substance and Sexual Activity  . Alcohol use: Yes    Comment: glass of red wine maybe  once week  . Drug use: No  . Sexual activity: Not on file  Other Topics Concern  . Not on file  Social History Narrative  . Not on file   Social Determinants of Health   Financial Resource Strain:   . Difficulty of Paying Living Expenses:   Food Insecurity:   . Worried About Charity fundraiser in the Last Year:   . Arboriculturist in the Last Year:   Transportation Needs:   . Film/video editor (Medical):   Marland Kitchen Lack of Transportation (Non-Medical):   Physical Activity:   . Days of Exercise per Week:   . Minutes of Exercise per Session:   Stress:   . Feeling of Stress :   Social Connections:   . Frequency of Communication with Friends and  Family:   . Frequency of Social Gatherings with Friends and Family:   . Attends Religious Services:   . Active Member of Clubs or Organizations:   . Attends Archivist Meetings:   Marland Kitchen Marital Status:    Allergies  Allergen Reactions  . Butrans [Buprenorphine] Other (See Comments)    Severe chest pains  . Codeine Other (See Comments)    Severe Chest pains  . Morphine And Related Other (See Comments)    Severe chest pains   Family History  Problem Relation Age of Onset  . Stroke Mother   . Heart attack Mother   . Stroke Father   . Heart attack Father   . Heart attack Maternal Grandmother   . Stroke Maternal Grandmother   . Stroke Paternal Grandmother   . Heart attack Paternal Grandmother   . Heart attack Maternal Grandfather   . Stroke Maternal Grandfather   . Stroke Paternal Grandfather   . Heart attack Paternal Grandfather   . Colon cancer Neg Hx   . Pancreatic cancer Neg Hx   . Stomach cancer Neg Hx   . Esophageal cancer Neg Hx      Current Outpatient Medications (Cardiovascular):  .  amLODipine (NORVASC) 5 MG tablet, Take 1 tablet (5 mg total) by mouth at bedtime. .  cholestyramine (QUESTRAN) 4 g packet, Take 1 packet (4 g total) by mouth daily as needed. Need office visit for additional refills .  furosemide (LASIX) 20 MG tablet, Take 1 tablet (20 mg total) by mouth daily. Marland Kitchen  losartan (COZAAR) 100 MG tablet, Take 1 tablet (100 mg total) by mouth daily.  Current Outpatient Medications (Respiratory):  .  fexofenadine (ALLEGRA) 180 MG tablet, Take 180 mg by mouth daily.  Current Outpatient Medications (Analgesics):  .  acetaminophen (TYLENOL) 650 MG CR tablet, Take 650 mg by mouth every 8 (eight) hours as needed for pain. Marland Kitchen  aspirin EC 325 MG tablet, Take 325 mg by mouth daily.  Current Outpatient Medications (Hematological):  Marland Kitchen  IRON PO, Take 65 mg by mouth 3 (three) times a week.  Current Outpatient Medications (Other):  Marland Kitchen  ALPRAZolam (XANAX) 0.5 MG  tablet, Take 1 tablet (0.5 mg total) by mouth daily as needed. Need office visit for additional refills .  Apoaequorin (PREVAGEN) 10 MG CAPS, Take 10 mg by mouth daily. Marland Kitchen  dicyclomine (BENTYL) 10 MG capsule, Take 1 capsule by mouth twice daily .  famotidine (PEPCID) 20 MG tablet, Take 1 tablet by mouth once daily .  loperamide (IMODIUM) 2 MG capsule, Take by mouth as needed for diarrhea or loose stools. .  meclizine (ANTIVERT) 25 MG tablet, Take 1 tablet (25  mg total) by mouth 3 (three) times daily as needed for dizziness. .  potassium chloride SA (KLOR-CON) 20 MEQ tablet, Take 1 tablet (20 mEq total) by mouth daily. (Patient taking differently: Take 20 mEq by mouth once a week. Takes every saturday) .  Vitamin D, Ergocalciferol, (DRISDOL) 1.25 MG (50000 UNIT) CAPS capsule, Take 1 capsule by mouth once a week   Reviewed prior external information including notes and imaging from  primary care provider As well as notes that were available from care everywhere and other healthcare systems.  Past medical history, social, surgical and family history all reviewed in electronic medical record.  No pertanent information unless stated regarding to the chief complaint.   Review of Systems:  No headache, visual changes, nausea, vomiting, diarrhea, constipation, dizziness, abdominal pain, skin rash, fevers, chills, night sweats, weight loss, swollen lymph nodes, body aches, joint swelling, chest pain, shortness of breath, mood changes. POSITIVE muscle aches  Objective  Blood pressure (!) 150/90, pulse 70, height 5\' 1"  (1.549 m), weight 106 lb (48.1 kg), SpO2 96 %.   General: No apparent distress alert and oriented x3 mood and affect normal, dressed appropriately.  Severe arthritic changes of multiple joints.  Patient is ambulating with the aid of a walker.  Patient is frail overall.  Hip exams bilaterally does show atrophy of the musculature bilaterally.  Increasing note mild swelling of over the greater  trochanteric area bilaterally right greater than left.  Unable to do Corky Sox secondary to discomfort and pain.  Neurovascularly intact distally.  Dorsalis pedis pulses felt and brisk.   Procedure: Real-time Ultrasound Guided Injection of right greater trochanteric bursitis secondary to patient's body habitus Device: GE Logiq Q7 Ultrasound guided injection is preferred based studies that show increased duration, increased effect, greater accuracy, decreased procedural pain, increased response rate, and decreased cost with ultrasound guided versus blind injection.  Verbal informed consent obtained.  Time-out conducted.  Noted no overlying erythema, induration, or other signs of local infection.  Skin prepped in a sterile fashion.  Local anesthesia: Topical Ethyl chloride.  With sterile technique and under real time ultrasound guidance:  Greater trochanteric area was visualized and patient's bursa was noted. A 22-gauge 3 inch needle was inserted and 4 cc of 0.5% Marcaine and 1 cc of Kenalog 40 mg/dL was injected. Pictures taken Completed without difficulty  Pain immediately resolved suggesting accurate placement of the medication.  Advised to call if fevers/chills, erythema, induration, drainage, or persistent bleeding.  Images permanently stored and available for review in the ultrasound unit.  Impression: Technically successful ultrasound guided injection.   Procedure: Real-time Ultrasound Guided Injection of left  greater trochanteric bursitis secondary to patient's body habitus Device: GE Logiq Q7  Ultrasound guided injection is preferred based studies that show increased duration, increased effect, greater accuracy, decreased procedural pain, increased response rate, and decreased cost with ultrasound guided versus blind injection.  Verbal informed consent obtained.  Time-out conducted.  Noted no overlying erythema, induration, or other signs of local infection.  Skin prepped in a sterile  fashion.  Local anesthesia: Topical Ethyl chloride.  With sterile technique and under real time ultrasound guidance:  Greater trochanteric area was visualized and patient's bursa was noted. A 22-gauge 3 inch needle was inserted and 4 cc of 0.5% Marcaine and 1 cc of Kenalog 40 mg/dL was injected. Pictures taken Completed without difficulty  Pain immediately resolved suggesting accurate placement of the medication.  Advised to call if fevers/chills, erythema, induration, drainage, or persistent bleeding.  Images permanently stored and available for review in the ultrasound unit.  Impression: Technically successful ultrasound guided injection.    Impression and Recommendations:     The above documentation has been reviewed and is accurate and complete Lyndal Pulley, DO       Note: This dictation was prepared with Dragon dictation along with smaller phrase technology. Any transcriptional errors that result from this process are unintentional.

## 2020-05-26 NOTE — Assessment & Plan Note (Signed)
Chronic problem with exacerbation.  Once again is secondary to patient's other comorbidities patient continues to have difficulty.  Patient has lost a lot of the subcutaneous tissue in the area but there is nothing else we can potentially doing patient continues to have improvement in her quality of life when she does have these injections.  We discussed the potential for either advanced imaging but it likely would not change medical management with patient saying that no matter what she would not have any surgical intervention.  Patient denies any fevers chills or any abnormal weight loss.  We discussed the potential of this though also being more of a lumbar radiculopathy and I would like to get new x-rays with her last x-rays being 86 years old.  Depending on findings we may suggest the possibility of advanced imaging and epidurals.  Patient is in agreement with the plan and will follow up again in 2 months

## 2020-05-27 ENCOUNTER — Encounter: Payer: Self-pay | Admitting: Family Medicine

## 2020-05-28 ENCOUNTER — Telehealth: Payer: Self-pay | Admitting: Family Medicine

## 2020-05-28 NOTE — Telephone Encounter (Signed)
Patient called asking if someone could call her to go over her xray results. She does not have access to MyChart at this time.  I did give her Dr Thompson Caul response. She asked for more detail is possible.

## 2020-05-28 NOTE — Telephone Encounter (Signed)
Called patient back and told her at this time we will watch her back.  Worsening symptoms I do feel an MRI is necessary secondary to the L4-L5 degenerative disc disease that could be contributing to some of the thigh pain.  Patient is in agreement with the plan all questions were answered

## 2020-05-31 ENCOUNTER — Other Ambulatory Visit: Payer: Self-pay | Admitting: Nurse Practitioner

## 2020-05-31 DIAGNOSIS — I10 Essential (primary) hypertension: Secondary | ICD-10-CM

## 2020-05-31 DIAGNOSIS — F418 Other specified anxiety disorders: Secondary | ICD-10-CM

## 2020-06-01 ENCOUNTER — Other Ambulatory Visit: Payer: Self-pay

## 2020-06-02 ENCOUNTER — Encounter: Payer: Self-pay | Admitting: Nurse Practitioner

## 2020-06-02 ENCOUNTER — Ambulatory Visit (INDEPENDENT_AMBULATORY_CARE_PROVIDER_SITE_OTHER): Payer: Medicare Other | Admitting: Nurse Practitioner

## 2020-06-02 VITALS — BP 146/82 | HR 66 | Temp 97.6°F | Ht 61.0 in | Wt 103.0 lb

## 2020-06-02 DIAGNOSIS — K529 Noninfective gastroenteritis and colitis, unspecified: Secondary | ICD-10-CM

## 2020-06-02 DIAGNOSIS — I10 Essential (primary) hypertension: Secondary | ICD-10-CM | POA: Diagnosis not present

## 2020-06-02 DIAGNOSIS — Z8673 Personal history of transient ischemic attack (TIA), and cerebral infarction without residual deficits: Secondary | ICD-10-CM | POA: Diagnosis not present

## 2020-06-02 MED ORDER — DIPHENOXYLATE-ATROPINE 2.5-0.025 MG PO TABS: 1.0000 | ORAL_TABLET | Freq: Every day | ORAL | 0 refills | Status: DC | PRN

## 2020-06-02 NOTE — Patient Instructions (Addendum)
Cancel upcoming appt Schedule 1weeks f/up for HTN re eval Increase amlodipine to 67m at bedtime Maintain metoprolol and losartan  Go to lab for stool collection kit Return stool sample to lab once collection You will be contacted to schedule appt with GI.

## 2020-06-02 NOTE — Progress Notes (Signed)
Subjective:  Patient ID: Susan [name], female    DOB: [birth year]  Age: 86 y.o. MRN: [id number]  CC: Follow-up (HTN-pt reports her last few readings since this past weekend has been 150's top number-she didn't bring in her last readings//no tetanus)  HPI Chronic Diarrhea: Worsening diarrhea, Experience stool incontinence. Worsening LE edema with imodium Minimal improvement with questran No ABD pain or nausea or fever or melena or hematochezia. She will like to resume lomotil  Wt Readings from Last 3 Encounters:  06/02/20 103 lb (46.7 kg)  05/26/20 106 lb (48.1 kg)  05/19/20 107 lb (48.5 kg)   HTN: Elevated She thinks it is related to discontinuation of metoprolol back in April. She resumed medication 2weeks ago. She had ED visit on 05/19/2020 due to vertigo and elevated BP.   Hx of CVA with mild cognitive impairment and speech disturbance: Head CT completed during recent ED visit indicates previous cortical infarct. Current use of amlodipine 5mg , losartan 100mg , metoprolol 25mg  No statin use, LDL at 56, use of fish oil and aspirin 325mg  OTC. Last appt with neurology 04/2020, started on Prevagen 10mg , she declined MRI in past. Carotid doppler completed 01/2020 (Right Carotid: Velocities in the right ICA are consistent with a 40-59% stenosis. Left Carotid: Velocities in the left ICA are consistent with a 1-39%  stenosis.Vertebrals: Bilateral vertebral arteries demonstrate antegrade flow. Subclavians: Normal flow hemodynamics were seen in bilateral subclavian  Arteries).  Transcranial doppler completed 01/2020 (Normal mean flow velocities in both opthalmics,carotid siphons and posterior circulations vessels).  BP Readings from Last 3 Encounters:  06/02/20 (!) 146/82  05/26/20 (!) 150/90  05/19/20 (!) 198/78   Reviewed past Medical, Social and Family history today.  Outpatient Medications Prior to Visit  Medication Sig Dispense Refill  . acetaminophen (TYLENOL) 650 MG CR tablet  Take 650 mg by mouth every 8 (eight) hours as needed for pain.    Marland Kitchen ALPRAZolam (XANAX) 0.5 MG tablet Take 1 tablet (0.5 mg total) by mouth daily as needed for anxiety. No additional refills without office visit 30 tablet 0  . amLODipine (NORVASC) 5 MG tablet Take 2 tablets (10 mg total) by mouth at bedtime. 90 tablet 3  . Apoaequorin (PREVAGEN) 10 MG CAPS Take 10 mg by mouth daily.    Marland Kitchen aspirin EC 325 MG tablet Take 325 mg by mouth daily.    . cholestyramine (QUESTRAN) 4 g packet Take 1 packet (4 g total) by mouth daily as needed. Need office visit for additional refills 60 each 1  . dicyclomine (BENTYL) 10 MG capsule Take 1 capsule by mouth twice daily 180 capsule 0  . famotidine (PEPCID) 20 MG tablet Take 1 tablet by mouth once daily 90 tablet 0  . fexofenadine (ALLEGRA) 180 MG tablet Take 180 mg by mouth daily.    . furosemide (LASIX) 20 MG tablet Take 1 tablet (20 mg total) by mouth daily. 90 tablet 1  . IRON PO Take 65 mg by mouth 3 (three) times a week.    . losartan (COZAAR) 100 MG tablet Take 1 tablet (100 mg total) by mouth daily. 90 tablet 3  . meclizine (ANTIVERT) 25 MG tablet Take 1 tablet (25 mg total) by mouth 3 (three) times daily as needed for dizziness. 30 tablet 0  . potassium chloride SA (KLOR-CON) 20 MEQ tablet Take 1 tablet (20 mEq total) by mouth daily. (Patient taking differently: Take 20 mEq by mouth once a week. Takes every saturday) 90 tablet 1  . Vitamin D,  Ergocalciferol, (DRISDOL) 1.25 MG (50000 UNIT) CAPS capsule Take 1 capsule by mouth once a week 12 capsule 0  . amLODipine (NORVASC) 5 MG tablet Take 1 tablet (5 mg total) by mouth at bedtime. 90 tablet 3  . loperamide (IMODIUM) 2 MG capsule Take by mouth as needed for diarrhea or loose stools.    . metoprolol succinate (TOPROL-XL) 25 MG 24 hr tablet Take 1 tablet (25 mg total) by mouth daily. 90 tablet 3   No facility-administered medications prior to visit.   ROS See HPI  Objective:  BP (!) 146/82   Pulse 66    Temp 97.6 F (36.4 C) (Tympanic)   Ht 5\' 1"  (1.549 m)   Wt 103 lb (46.7 kg)   SpO2 97%   BMI 19.46 kg/m   BP Readings from Last 3 Encounters:  06/02/20 (!) 146/82  05/26/20 (!) 150/90  05/19/20 (!) 198/78   Wt Readings from Last 3 Encounters:  06/02/20 103 lb (46.7 kg)  05/26/20 106 lb (48.1 kg)  05/19/20 107 lb (48.5 kg)    Physical Exam Eyes:     Extraocular Movements: Extraocular movements intact.     Conjunctiva/sclera: Conjunctivae normal.  Cardiovascular:     Rate and Rhythm: Normal rate.     Pulses: Normal pulses.     Heart sounds: Normal heart sounds.  Pulmonary:     Effort: Pulmonary effort is normal.     Breath sounds: Normal breath sounds.  Abdominal:     General: There is no distension.     Palpations: Abdomen is soft.     Tenderness: There is no abdominal tenderness.  Musculoskeletal:     Right lower leg: No edema.     Left lower leg: No edema.  Neurological:     Mental Status: She is alert and oriented to person, place, and time.     Cranial Nerves: No cranial nerve deficit.  Psychiatric:        Mood and Affect: Mood normal.        Behavior: Behavior normal.        Thought Content: Thought content normal.     Lab Results  Component Value Date   WBC 5.0 05/19/2020   HGB 12.6 05/19/2020   HCT 40.2 05/19/2020   PLT 236 05/19/2020   GLUCOSE 107 (H) 05/19/2020   CHOL 159 08/15/2019   TRIG 142.0 08/15/2019   HDL 74.40 08/15/2019   LDLDIRECT 102.0 05/01/2018   LDLCALC 56 08/15/2019   ALT 14 08/15/2019   AST 23 08/15/2019   NA 142 05/19/2020   K 3.8 05/19/2020   CL 105 05/19/2020   CREATININE 0.74 05/19/2020   BUN 11 05/19/2020   CO2 25 05/19/2020   TSH 2.120 01/14/2020   HGBA1C 5.7 08/15/2019   CT HEAD WO CONTRAST  Result Date: 05/19/2020 CLINICAL DATA:  Hypertension, dizziness EXAM: CT HEAD WITHOUT CONTRAST TECHNIQUE: Contiguous axial images were obtained from the base of the skull through the vertex without intravenous contrast.  COMPARISON:  None. FINDINGS: Brain: Small cortical hypodensity noted in the left posterior frontal/parietal region concerning for small cortical infarct, age indeterminate. No hemorrhage or hydrocephalus. Vascular: No hyperdense vessel or unexpected calcification. Skull: No acute calvarial abnormality. Sinuses/Orbits: Visualized paranasal sinuses and mastoids clear. Orbital soft tissues unremarkable. Other: None IMPRESSION: Small cortical/subcortical infarct in the left posterior frontal/parietal region, age indeterminate. Electronically Signed   By: Rolm Baptise M.D.   On: 05/19/2020 22:23    Assessment & Plan:  This visit occurred  during the SARS-CoV-2 public health emergency.  Safety protocols were in place, including screening questions prior to the visit, additional usage of staff PPE, and extensive cleaning of exam room while observing appropriate contact time as indicated for disinfecting solutions.   Myrissa was seen today for follow-up.  Diagnoses and all orders for this visit:  Chronic diarrhea -     Gastrointestinal Pathogen Panel PCR; Future -     diphenoxylate-atropine (LOMOTIL) 2.5-0.025 MG tablet; Take 1 tablet by mouth daily as needed for diarrhea or loose stools. -     Ambulatory referral to Gastroenterology  Essential hypertension  History of CVA (cerebrovascular accident) without residual deficits   I have discontinued Susan [name]'s loperamide. I have also changed her amLODipine. Additionally, I am having her start on diphenoxylate-atropine. Lastly, I am having her maintain her fexofenadine, aspirin EC, acetaminophen, IRON PO, losartan, potassium chloride SA, furosemide, Vitamin D (Ergocalciferol), cholestyramine, dicyclomine, Prevagen, famotidine, meclizine, ALPRAZolam, and metoprolol succinate.  Meds ordered this encounter  Medications  . diphenoxylate-atropine (LOMOTIL) 2.5-0.025 MG tablet    Sig: Take 1 tablet by mouth daily as needed for diarrhea or loose stools.     Dispense:  15 tablet    Refill:  0    Order Specific Question:   Supervising Provider    Answer:   Ronnald Nian [[id number]]    Problem List Items Addressed This Visit      Cardiovascular and Mediastinum   Essential hypertension   Relevant Medications   metoprolol succinate (TOPROL-XL) 25 MG 24 hr tablet   amLODipine (NORVASC) 5 MG tablet     Digestive   Chronic diarrhea - Primary   Relevant Medications   diphenoxylate-atropine (LOMOTIL) 2.5-0.025 MG tablet   Other Relevant Orders   Gastrointestinal Pathogen Panel PCR   Ambulatory referral to Gastroenterology     Other   History of CVA (cerebrovascular accident) without residual deficits      Follow-up: Return in about 1 week (around 06/09/2020) for HTN (F2F, 94mins).  Wilfred Lacy, NP

## 2020-06-04 NOTE — Assessment & Plan Note (Addendum)
Head CT completed during recent ED visit indicates previous cortical infarct. Current use of amlodipine 5mg , losartan 100mg , metoprolol 25mg  No statin use, LDL at 56, use of fish oil and aspirin 325mg  OTC. Last appt with neurology 04/2020, started on Prevagen 10mg , she declined MRI in past. Carotid doppler completed 01/2020 (Right Carotid: Velocities in the right ICA are consistent with a 40-59% stenosis. Left Carotid: Velocities in the left ICA are consistent with a 1-39%  stenosis.Vertebrals: Bilateral vertebral arteries demonstrate antegrade flow. Subclavians: Normal flow hemodynamics were seen in bilateral subclavian  Arteries).  Transcranial doppler completed 01/2020 (Normal mean flow velocities in both opthalmics,carotid siphons and posterior circulations vessels).  F/up with neurology

## 2020-06-07 NOTE — Progress Notes (Signed)
Thanks. Reviewed CY iage indeterminate infarct. Will discuss in office visit

## 2020-06-10 ENCOUNTER — Ambulatory Visit (INDEPENDENT_AMBULATORY_CARE_PROVIDER_SITE_OTHER): Payer: Medicare Other | Admitting: Nurse Practitioner

## 2020-06-10 ENCOUNTER — Encounter: Payer: Self-pay | Admitting: Nurse Practitioner

## 2020-06-10 ENCOUNTER — Other Ambulatory Visit: Payer: Self-pay

## 2020-06-10 VITALS — BP 138/82 | HR 68 | Temp 97.0°F | Ht 61.0 in | Wt 103.6 lb

## 2020-06-10 DIAGNOSIS — I10 Essential (primary) hypertension: Secondary | ICD-10-CM

## 2020-06-10 DIAGNOSIS — K529 Noninfective gastroenteritis and colitis, unspecified: Secondary | ICD-10-CM

## 2020-06-10 MED ORDER — DICYCLOMINE HCL 10 MG PO CAPS: 10.0000 mg | ORAL_CAPSULE | Freq: Two times a day (BID) | ORAL | 3 refills | Status: DC

## 2020-06-10 NOTE — Progress Notes (Signed)
Subjective:  Patient ID: Susan [name], female    DOB: 09-Dec-1923  Age: 86 y.o. MRN: [id number]  CC: Follow-up (1 week HTN-pt reports blood pressurs doing better/last checks 11/8-  127/74    123/68    139/74   110/66   123/67)  HPI Accompanied by daughter Almyra Free Essential hypertension Improved BP. Maintain losartan, amlodipine and metoprolol. No bradycardia BP Readings from Last 3 Encounters:  06/10/20 138/82  06/02/20 (!) 146/82  05/26/20 (!) 150/90    Chronic diarrhea OV note from 06/02/2020: Worsening diarrhea, Experience stool incontinence. Worsening LE edema with imodium Minimal improvement with questran No ABD pain or nausea or fever or melena or hematochezia. She will like to resume lomotil.  improved Last diarrhea episode (4 small loose stools) on Monday, took lomotil twice, no BM till today (hard and dry). Take dicyclomine once a day and questran prn. She is not interested in GI referral and has no interest in any invasive procedure.  She is to increase bentyl to BID. Avoid taking lomotil more than once a day, and only if >4loose stool a day. Continue questran prn.   Reviewed past Medical, Social and Family history today.  Outpatient Medications Prior to Visit  Medication Sig Dispense Refill  . acetaminophen (TYLENOL) 650 MG CR tablet Take 650 mg by mouth every 8 (eight) hours as needed for pain.    Marland Kitchen ALPRAZolam (XANAX) 0.5 MG tablet Take 1 tablet (0.5 mg total) by mouth daily as needed for anxiety. No additional refills without office visit 30 tablet 0  . amLODipine (NORVASC) 5 MG tablet Take 2 tablets (10 mg total) by mouth at bedtime. 90 tablet 3  . Apoaequorin (PREVAGEN) 10 MG CAPS Take 10 mg by mouth daily.    Marland Kitchen aspirin EC 325 MG tablet Take 325 mg by mouth daily.    . cholestyramine (QUESTRAN) 4 g packet Take 1 packet (4 g total) by mouth daily as needed. Need office visit for additional refills 60 each 1  . diphenoxylate-atropine (LOMOTIL) 2.5-0.025 MG  tablet Take 1 tablet by mouth daily as needed for diarrhea or loose stools. 15 tablet 0  . famotidine (PEPCID) 20 MG tablet Take 1 tablet by mouth once daily 90 tablet 0  . fexofenadine (ALLEGRA) 180 MG tablet Take 180 mg by mouth daily.    . furosemide (LASIX) 20 MG tablet Take 1 tablet (20 mg total) by mouth daily. 90 tablet 1  . IRON PO Take 65 mg by mouth 3 (three) times a week.    . losartan (COZAAR) 100 MG tablet Take 1 tablet (100 mg total) by mouth daily. 90 tablet 3  . meclizine (ANTIVERT) 25 MG tablet Take 1 tablet (25 mg total) by mouth 3 (three) times daily as needed for dizziness. 30 tablet 0  . metoprolol succinate (TOPROL-XL) 25 MG 24 hr tablet Take 1 tablet (25 mg total) by mouth daily. 90 tablet 3  . potassium chloride SA (KLOR-CON) 20 MEQ tablet Take 1 tablet (20 mEq total) by mouth daily. (Patient taking differently: Take 20 mEq by mouth once a week. Takes every saturday) 90 tablet 1  . Vitamin D, Ergocalciferol, (DRISDOL) 1.25 MG (50000 UNIT) CAPS capsule Take 1 capsule by mouth once a week 12 capsule 0  . dicyclomine (BENTYL) 10 MG capsule Take 1 capsule by mouth twice daily 180 capsule 0   No facility-administered medications prior to visit.    ROS See HPI  Objective:  BP 138/82   Pulse 68  Temp (!) 97 F (36.1 C) (Tympanic)   Ht 5\' 1"  (1.549 m)   Wt 103 lb 9.6 oz (47 kg)   SpO2 96%   BMI 19.58 kg/m   Physical Exam Vitals reviewed.  Cardiovascular:     Rate and Rhythm: Normal rate and regular rhythm.     Pulses: Normal pulses.     Heart sounds: Normal heart sounds.  Pulmonary:     Effort: Pulmonary effort is normal.     Breath sounds: Normal breath sounds.  Abdominal:     General: There is no distension.  Neurological:     Mental Status: She is alert and oriented to person, place, and time.    Assessment & Plan:  This visit occurred during the SARS-CoV-2 public health emergency.  Safety protocols were in place, including screening questions prior to  the visit, additional usage of staff PPE, and extensive cleaning of exam room while observing appropriate contact time as indicated for disinfecting solutions.   Susan [name] was seen today for follow-up.  Diagnoses and all orders for this visit:  Essential hypertension  Chronic diarrhea -     dicyclomine (BENTYL) 10 MG capsule; Take 1 capsule (10 mg total) by mouth 2 (two) times daily.    Problem List Items Addressed This Visit      Cardiovascular and Mediastinum   Essential hypertension - Primary    Improved BP. Maintain losartan, amlodipine and metoprolol. No bradycardia BP Readings from Last 3 Encounters:  06/10/20 138/82  06/02/20 (!) 146/82  05/26/20 (!) 150/90          Digestive   Chronic diarrhea    OV note from 06/02/2020: Worsening diarrhea, Experience stool incontinence. Worsening LE edema with imodium Minimal improvement with questran No ABD pain or nausea or fever or melena or hematochezia. She will like to resume lomotil.  improved Last diarrhea episode (4 small loose stools) on Monday, took lomotil twice, no BM till today (hard and dry). Take dicyclomine once a day and questran prn. She is not interested in GI referral and has no interest in any invasive procedure.  She is to increase bentyl to BID. Avoid taking lomotil more than once a day, and only if >4loose stool a day. Continue questran prn.       Relevant Medications   dicyclomine (BENTYL) 10 MG capsule      Follow-up: Return in about 3 months (around 09/10/2020) for HTN and , hyperlipidemia (F2F, 70mins).  Wilfred Lacy, NP

## 2020-06-10 NOTE — Patient Instructions (Addendum)
Maintain current BP medication dose Increase dicyclomine to BID Use lomotil once a day prn if >4loose stool a day.  Return stool sample to lab once collected.

## 2020-06-10 NOTE — Assessment & Plan Note (Signed)
Improved BP. Maintain losartan, amlodipine and metoprolol. No bradycardia BP Readings from Last 3 Encounters:  06/10/20 138/82  06/02/20 (!) 146/82  05/26/20 (!) 150/90

## 2020-06-10 NOTE — Assessment & Plan Note (Addendum)
OV note from 06/02/2020: Worsening diarrhea, Experience stool incontinence. Worsening LE edema with imodium Minimal improvement with questran No ABD pain or nausea or fever or melena or hematochezia. She will like to resume lomotil.  improved Last diarrhea episode (4 small loose stools) on Monday, took lomotil twice, no BM till today (hard and dry). Take dicyclomine once a day and questran prn. She is not interested in GI referral and has no interest in any invasive procedure.  She is to increase bentyl to BID. Avoid taking lomotil more than once a day, and only if >4loose stool a day. Continue questran prn.

## 2020-06-14 ENCOUNTER — Other Ambulatory Visit: Payer: Medicare Other

## 2020-06-14 ENCOUNTER — Other Ambulatory Visit: Payer: Self-pay | Admitting: Family Medicine

## 2020-06-14 ENCOUNTER — Other Ambulatory Visit: Payer: Self-pay | Admitting: Nurse Practitioner

## 2020-06-14 DIAGNOSIS — K529 Noninfective gastroenteritis and colitis, unspecified: Secondary | ICD-10-CM

## 2020-06-14 DIAGNOSIS — I10 Essential (primary) hypertension: Secondary | ICD-10-CM

## 2020-06-14 DIAGNOSIS — I872 Venous insufficiency (chronic) (peripheral): Secondary | ICD-10-CM

## 2020-06-15 NOTE — Telephone Encounter (Signed)
Last OV 06/10/20 Last fill 01/02/20  #90/1

## 2020-06-16 DIAGNOSIS — Z20828 Contact with and (suspected) exposure to other viral communicable diseases: Secondary | ICD-10-CM | POA: Diagnosis not present

## 2020-06-16 DIAGNOSIS — Z1159 Encounter for screening for other viral diseases: Secondary | ICD-10-CM | POA: Diagnosis not present

## 2020-06-18 LAB — GASTROINTESTINAL PATHOGEN PANEL PCR
C. difficile Tox A/B, PCR: NOT DETECTED
Campylobacter, PCR: NOT DETECTED
Cryptosporidium, PCR: NOT DETECTED
E coli (ETEC) LT/ST PCR: NOT DETECTED
E coli (STEC) stx1/stx2, PCR: NOT DETECTED
E coli 0157, PCR: NOT DETECTED
Giardia lamblia, PCR: NOT DETECTED
Norovirus, PCR: NOT DETECTED
Rotavirus A, PCR: NOT DETECTED
Salmonella, PCR: NOT DETECTED
Shigella, PCR: NOT DETECTED

## 2020-06-23 DIAGNOSIS — Z20828 Contact with and (suspected) exposure to other viral communicable diseases: Secondary | ICD-10-CM | POA: Diagnosis not present

## 2020-06-23 DIAGNOSIS — Z1159 Encounter for screening for other viral diseases: Secondary | ICD-10-CM | POA: Diagnosis not present

## 2020-06-24 ENCOUNTER — Telehealth: Payer: Self-pay | Admitting: Nurse Practitioner

## 2020-06-24 ENCOUNTER — Ambulatory Visit: Payer: Medicare Other | Admitting: Nurse Practitioner

## 2020-06-24 DIAGNOSIS — K529 Noninfective gastroenteritis and colitis, unspecified: Secondary | ICD-10-CM

## 2020-06-24 MED ORDER — DIPHENOXYLATE-ATROPINE 2.5-0.025 MG PO TABS: 1.0000 | ORAL_TABLET | Freq: Every day | ORAL | 0 refills | Status: DC | PRN

## 2020-06-24 NOTE — Telephone Encounter (Signed)
-----   Message from Lucila Maine, [place] sent at 06/22/2020 11:19 AM EDT ----- Baldo Ash please advise.  Pt was notified and verbally understood.  Pt wanting me to message you no other doctor and ask if she could have a refill for the Lomotil. She is also wondering if she can have more than 15 maybe 30 cause she doesn't have the transportation.  Lomotil 15 tabs  0 refills Last filled-06/02/20 Last OV-06/10/20

## 2020-06-24 NOTE — Telephone Encounter (Signed)
30tabs sent. Please advise to to take only 1tab daily prn. Remind to use questran and probiotics daily to help bulk her stool.

## 2020-06-28 ENCOUNTER — Ambulatory Visit: Payer: Medicare Other | Admitting: Nurse Practitioner

## 2020-06-29 ENCOUNTER — Other Ambulatory Visit: Payer: Self-pay | Admitting: Nurse Practitioner

## 2020-06-29 DIAGNOSIS — F418 Other specified anxiety disorders: Secondary | ICD-10-CM

## 2020-06-29 DIAGNOSIS — K529 Noninfective gastroenteritis and colitis, unspecified: Secondary | ICD-10-CM

## 2020-06-29 NOTE — Telephone Encounter (Signed)
Last OV 06/10/20 Last fill for Xanax 05/31/20  #30/0 Last fill for Lomotil 06/24/20  #30/0

## 2020-06-30 DIAGNOSIS — Z1159 Encounter for screening for other viral diseases: Secondary | ICD-10-CM | POA: Diagnosis not present

## 2020-06-30 DIAGNOSIS — Z20828 Contact with and (suspected) exposure to other viral communicable diseases: Secondary | ICD-10-CM | POA: Diagnosis not present

## 2020-07-07 DIAGNOSIS — Z1159 Encounter for screening for other viral diseases: Secondary | ICD-10-CM | POA: Diagnosis not present

## 2020-07-07 DIAGNOSIS — Z20828 Contact with and (suspected) exposure to other viral communicable diseases: Secondary | ICD-10-CM | POA: Diagnosis not present

## 2020-07-08 ENCOUNTER — Telehealth: Payer: Self-pay | Admitting: Nurse Practitioner

## 2020-07-08 NOTE — Telephone Encounter (Signed)
Patient is calling and requesting a refill for amlodipine sent to [facility] on [facility], please advise. CB is [phone]

## 2020-07-12 ENCOUNTER — Other Ambulatory Visit: Payer: Self-pay

## 2020-07-12 DIAGNOSIS — I10 Essential (primary) hypertension: Secondary | ICD-10-CM

## 2020-07-12 MED ORDER — AMLODIPINE BESYLATE 5 MG PO TABS: 10.0000 mg | ORAL_TABLET | Freq: Every day | ORAL | 3 refills | Status: DC

## 2020-07-12 NOTE — Telephone Encounter (Signed)
Rx sent today.

## 2020-07-12 NOTE — Telephone Encounter (Signed)
Last fill 06/02/20  #90/3 Last ov 06/10/20

## 2020-07-12 NOTE — Telephone Encounter (Signed)
Patient is calling back to follow up on previous message. Please call her back at [phone].

## 2020-07-14 DIAGNOSIS — Z20828 Contact with and (suspected) exposure to other viral communicable diseases: Secondary | ICD-10-CM | POA: Diagnosis not present

## 2020-07-14 DIAGNOSIS — Z1159 Encounter for screening for other viral diseases: Secondary | ICD-10-CM | POA: Diagnosis not present

## 2020-07-21 DIAGNOSIS — Z1159 Encounter for screening for other viral diseases: Secondary | ICD-10-CM | POA: Diagnosis not present

## 2020-07-21 DIAGNOSIS — Z20828 Contact with and (suspected) exposure to other viral communicable diseases: Secondary | ICD-10-CM | POA: Diagnosis not present

## 2020-07-23 DIAGNOSIS — H35363 Drusen (degenerative) of macula, bilateral: Secondary | ICD-10-CM | POA: Diagnosis not present

## 2020-07-23 DIAGNOSIS — H04123 Dry eye syndrome of bilateral lacrimal glands: Secondary | ICD-10-CM | POA: Diagnosis not present

## 2020-07-25 ENCOUNTER — Other Ambulatory Visit: Payer: Self-pay | Admitting: Nurse Practitioner

## 2020-07-25 DIAGNOSIS — K529 Noninfective gastroenteritis and colitis, unspecified: Secondary | ICD-10-CM

## 2020-07-25 DIAGNOSIS — R1084 Generalized abdominal pain: Secondary | ICD-10-CM

## 2020-07-26 NOTE — Telephone Encounter (Signed)
Medication refill request for Lomotil. Forwarding to PCP for review.  Last OV 06/02/20. Last med refill 06/24/2020// # 30 // no refills  Contacted patient to discuss diarrhea. Patient states she has 1-4 episodes daily. Also taking Questran daily. Would like to get ambulatory referral to GI. Discussed a virtual appt with PCP. Unable to participate in a video visit at this time but agreed to a telephone visit.

## 2020-07-26 NOTE — Assessment & Plan Note (Signed)
Susan [name] reports intermittent ABD pain and loose stools despite use of questran daily, bentyl BID and lomotil daily. States she does not have loose stool daily. Refill requested every 30days. Hold lomotil rx for now. Continue questran and bentyl. Go for DG ABD at [facility], to r/o constipation. Previous stool study: negative for bacteria. Last OV with GI: Dr. Myrtice Lauth 12/2018 "negative celiac lab, no improvement with lactose free diet, stated to consider microscopic/collagenous colitis?, she declined any invasive procedure due to her age and possible complications, recommended to d/c lomotil and imodium use, advised to take questran 2packs per day".

## 2020-07-26 NOTE — Telephone Encounter (Signed)
Ms. Murphey reports persistent AND pain and loose stools despite use of questran daily, bentyl BID and lomotil daily. Hold lomotil rx for now Go for DG ABD at [facility], to r/o constipation. Previous stool study: negative for bacteria. Last OV with GI: Dr. Myrtice Lauth 12/2018 "negative celiac lab, no improvement with lactose free diet, stated to consider microscopic/collagenous colitis?, she declined any invasive procedure due to her age and possible complications, recommended to d/c lomotil and imodium use, advised to take questran 2packs per day".

## 2020-07-28 ENCOUNTER — Ambulatory Visit (INDEPENDENT_AMBULATORY_CARE_PROVIDER_SITE_OTHER): Payer: Medicare Other

## 2020-07-28 ENCOUNTER — Ambulatory Visit: Payer: Self-pay

## 2020-07-28 ENCOUNTER — Other Ambulatory Visit: Payer: Self-pay

## 2020-07-28 ENCOUNTER — Encounter: Payer: Self-pay | Admitting: Family Medicine

## 2020-07-28 ENCOUNTER — Ambulatory Visit (INDEPENDENT_AMBULATORY_CARE_PROVIDER_SITE_OTHER): Payer: Medicare Other | Admitting: Family Medicine

## 2020-07-28 VITALS — BP 130/74 | HR 66 | Ht 61.0 in | Wt 104.0 lb

## 2020-07-28 DIAGNOSIS — M7061 Trochanteric bursitis, right hip: Secondary | ICD-10-CM | POA: Diagnosis not present

## 2020-07-28 DIAGNOSIS — M25552 Pain in left hip: Secondary | ICD-10-CM | POA: Diagnosis not present

## 2020-07-28 DIAGNOSIS — K529 Noninfective gastroenteritis and colitis, unspecified: Secondary | ICD-10-CM | POA: Diagnosis not present

## 2020-07-28 DIAGNOSIS — M160 Bilateral primary osteoarthritis of hip: Secondary | ICD-10-CM | POA: Diagnosis not present

## 2020-07-28 DIAGNOSIS — M25551 Pain in right hip: Secondary | ICD-10-CM | POA: Diagnosis not present

## 2020-07-28 DIAGNOSIS — R197 Diarrhea, unspecified: Secondary | ICD-10-CM | POA: Diagnosis not present

## 2020-07-28 DIAGNOSIS — M7062 Trochanteric bursitis, left hip: Secondary | ICD-10-CM

## 2020-07-28 NOTE — Progress Notes (Signed)
[facility] [address] [place] Phone: [phone] Subjective:   I Susan [name] am serving as a Education administrator for Dr. Hulan Saas.  This visit occurred during the SARS-CoV-2 public health emergency.  Safety protocols were in place, including screening questions prior to the visit, additional usage of staff PPE, and extensive cleaning of exam room while observing appropriate contact time as indicated for disinfecting solutions.   I'm seeing this patient by the request  of:  Nche, Charlene Brooke, NP  CC: Bilateral hip pain follow-up  DDU:[id number]   05/26/2020 Chronic problem with exacerbation.  Once again is secondary to patient's other comorbidities patient continues to have difficulty.  Patient has lost a lot of the subcutaneous tissue in the area but there is nothing else we can potentially doing patient continues to have improvement in her quality of life when she does have these injections.  We discussed the potential for either advanced imaging but it likely would not change medical management with patient saying that no matter what she would not have any surgical intervention.  Patient denies any fevers chills or any abnormal weight loss.  We discussed the potential of this though also being more of a lumbar radiculopathy and I would like to get new x-rays with her last x-rays being 86 years old.  Depending on findings we may suggest the possibility of advanced imaging and epidurals.  Patient is in agreement with the plan and will follow up again in 2 months  Update 07/28/2020 Susan [name] is a 86 y.o. female coming in with complaint of bilateral hip pain. Patient states she has been a little better.  Patient states that overall seems to be doing the same.  Patient states that she has never without some discomfort of the sides of the hip.  Patient on last exam did have x-rays of the lumbar spine done.  X-rays were independently visualized by me  showing that there was some degenerative disc disease of the lumbar spine especially L4-L5 and L5-S1.  Patient continues to try to stay active where she can.  Continues to walk with a rolling walker     Past Medical History:  Diagnosis Date  . Actinic keratosis, hx of    chest wall (2012)  . Allergic rhinitis   . Anxiety   . Arthritis    "spine, hips" (01/30/2017)  . Bursitis   . Cellulitis of right foot 01/30/2017  . Colon polyps   . Diverticulosis   . Esophageal dysmotilities 10/16/2017  . Fibromyalgia    "qwhere" (01/30/2017)  . Gallstones   . GERD (gastroesophageal reflux disease)   . High cholesterol   . History of kidney stones   . History of stomach ulcers   . Hx of squamous cell carcinoma excision    right mandible (2011), right arm (2014)  . Hypertension   . IBS (irritable bowel syndrome)   . Osteoarthritis   . Pneumonia 2000s X 2   "twice" (01/30/2017)  . Pyloric stenosis   . Stroke [facility]) 10/2014   denies residual on 01/30/2017   Past Surgical History:  Procedure Laterality Date  . APPENDECTOMY    . CARDIOVASCULAR STRESS TEST  04/2016   nuclear cardiolite stress test done in [place] (no ischemia, no LVH, no LV systolic function)  . CATARACT EXTRACTION W/ INTRAOCULAR LENS  IMPLANT, BILATERAL Bilateral   . CHOLECYSTECTOMY OPEN    . COLONOSCOPY  2009, 2014  . DIAGNOSTIC MAMMOGRAM  2009, 2012  .  TONSILLECTOMY     Social History   Socioeconomic History  . Marital status: Widowed    Spouse name: Not on file  . Number of children: 4  . Years of education: Not on file  . Highest education level: Not on file  Occupational History  . Occupation: retired  Tobacco Use  . Smoking status: Never Smoker  . Smokeless tobacco: Never Used  Vaping Use  . Vaping Use: Never used  Substance and Sexual Activity  . Alcohol use: Yes    Comment: glass of red wine maybe once week  . Drug use: No  . Sexual activity: Not on file  Other Topics Concern  . Not on file    Social History Narrative  . Not on file   Social Determinants of Health   Financial Resource Strain:   . Difficulty of Paying Living Expenses: Not on file  Food Insecurity:   . Worried About Charity fundraiser in the Last Year: Not on file  . Ran Out of Food in the Last Year: Not on file  Transportation Needs:   . Lack of Transportation (Medical): Not on file  . Lack of Transportation (Non-Medical): Not on file  Physical Activity:   . Days of Exercise per Week: Not on file  . Minutes of Exercise per Session: Not on file  Stress:   . Feeling of Stress : Not on file  Social Connections:   . Frequency of Communication with Friends and Family: Not on file  . Frequency of Social Gatherings with Friends and Family: Not on file  . Attends Religious Services: Not on file  . Active Member of Clubs or Organizations: Not on file  . Attends Archivist Meetings: Not on file  . Marital Status: Not on file   Allergies  Allergen Reactions  . Butrans [Buprenorphine] Other (See Comments)    Severe chest pains  . Codeine Other (See Comments)    Severe Chest pains  . Morphine And Related Other (See Comments)    Severe chest pains   Family History  Problem Relation Age of Onset  . Stroke Mother   . Heart attack Mother   . Stroke Father   . Heart attack Father   . Heart attack Maternal Grandmother   . Stroke Maternal Grandmother   . Stroke Paternal Grandmother   . Heart attack Paternal Grandmother   . Heart attack Maternal Grandfather   . Stroke Maternal Grandfather   . Stroke Paternal Grandfather   . Heart attack Paternal Grandfather   . Colon cancer Neg Hx   . Pancreatic cancer Neg Hx   . Stomach cancer Neg Hx   . Esophageal cancer Neg Hx      Current Outpatient Medications (Cardiovascular):  .  amLODipine (NORVASC) 5 MG tablet, Take 2 tablets (10 mg total) by mouth at bedtime. .  cholestyramine (QUESTRAN) 4 g packet, Take 1 packet (4 g total) by mouth daily as  needed. Need office visit for additional refills .  furosemide (LASIX) 20 MG tablet, Take 1 tablet by mouth once daily .  losartan (COZAAR) 100 MG tablet, Take 1 tablet (100 mg total) by mouth daily. .  metoprolol succinate (TOPROL-XL) 25 MG 24 hr tablet, Take 1 tablet (25 mg total) by mouth daily.  Current Outpatient Medications (Respiratory):  .  fexofenadine (ALLEGRA) 180 MG tablet, Take 180 mg by mouth daily.  Current Outpatient Medications (Analgesics):  .  acetaminophen (TYLENOL) 650 MG CR tablet, Take 650 mg  by mouth every 8 (eight) hours as needed for pain. Marland Kitchen  aspirin EC 325 MG tablet, Take 325 mg by mouth daily.  Current Outpatient Medications (Hematological):  Marland Kitchen  IRON PO, Take 65 mg by mouth 3 (three) times a week.  Current Outpatient Medications (Other):  Marland Kitchen  ALPRAZolam (XANAX) 0.5 MG tablet, Take 1 tablet (0.5 mg total) by mouth daily as needed for anxiety. Marland Kitchen  Apoaequorin (PREVAGEN) 10 MG CAPS, Take 10 mg by mouth daily. Marland Kitchen  dicyclomine (BENTYL) 10 MG capsule, Take 1 capsule (10 mg total) by mouth 2 (two) times daily. .  diphenoxylate-atropine (LOMOTIL) 2.5-0.025 MG tablet, Take 1 tablet by mouth daily as needed for diarrhea or loose stools. .  famotidine (PEPCID) 20 MG tablet, Take 1 tablet by mouth once daily .  meclizine (ANTIVERT) 25 MG tablet, Take 1 tablet (25 mg total) by mouth 3 (three) times daily as needed for dizziness. .  potassium chloride SA (KLOR-CON) 20 MEQ tablet, Take 1 tablet (20 mEq total) by mouth daily. (Patient taking differently: Take 20 mEq by mouth once a week. Takes every saturday) .  Vitamin D, Ergocalciferol, (DRISDOL) 1.25 MG (50000 UNIT) CAPS capsule, Take 1 capsule by mouth once a week   Reviewed prior external information including notes and imaging from  primary care provider As well as notes that were available from care everywhere and other healthcare systems.  Past medical history, social, surgical and family history all reviewed in  electronic medical record.  No pertanent information unless stated regarding to the chief complaint.   Review of Systems:  No headache, visual changes, nausea, vomiting, diarrhea, constipation, dizziness, abdominal pain, skin rash, fevers, chills, night sweats, weight loss, swollen lymph nodes, body aches, joint swelling, chest pain, shortness of breath, mood changes. POSITIVE muscle aches  Objective  Blood pressure 130/74, pulse 66, height 5\' 1"  (1.549 m), weight 104 lb (47.2 kg), SpO2 96 %.   General: No apparent distress alert and oriented x3 mood and affect normal, dressed appropriately.  HEENT: Pupils equal, extraocular movements intact  Patient is low back does have some tenderness to palpation.  Diffusely.  No spinous process tenderness.  Severe tenderness of the greater trochanteric area of the hips.  Decreased internal and external range of motion.  From previous injections as well as stress weakness of the hip girdle noted.   Procedure: Real-time Ultrasound Guided Injection of right greater trochanteric bursitis secondary to patient's body habitus Device: GE Logiq Q7 Ultrasound guided injection is preferred based studies that show increased duration, increased effect, greater accuracy, decreased procedural pain, increased response rate, and decreased cost with ultrasound guided versus blind injection.  Verbal informed consent obtained.  Time-out conducted.  Noted no overlying erythema, induration, or other signs of local infection.  Skin prepped in a sterile fashion.  Local anesthesia: Topical Ethyl chloride.  With sterile technique and under real time ultrasound guidance:  Greater trochanteric area was visualized and patient's bursa was noted. A 22-gauge 3 inch needle was inserted and 4 cc of 0.5% Marcaine and 1 cc of Kenalog 40 mg/dL was injected. Pictures taken Completed without difficulty  Pain immediately resolved suggesting accurate placement of the medication.  Advised to  call if fevers/chills, erythema, induration, drainage, or persistent bleeding.  Images permanently stored and available for review in the ultrasound unit.  Impression: Technically successful ultrasound guided injection.   Procedure: Real-time Ultrasound Guided Injection of left  greater trochanteric bursitis secondary to patient's body habitus Device: GE Logiq Q7  Ultrasound guided injection is preferred based studies that show increased duration, increased effect, greater accuracy, decreased procedural pain, increased response rate, and decreased cost with ultrasound guided versus blind injection.  Verbal informed consent obtained.  Time-out conducted.  Noted no overlying erythema, induration, or other signs of local infection.  Skin prepped in a sterile fashion.  Local anesthesia: Topical Ethyl chloride.  With sterile technique and under real time ultrasound guidance:  Greater trochanteric area was visualized and patient's bursa was noted. A 22-gauge 3 inch needle was inserted and 4 cc of 0.5% Marcaine and 1 cc of Kenalog 40 mg/dL was injected. Pictures taken Completed without difficulty  Pain immediately resolved suggesting accurate placement of the medication.  Advised to call if fevers/chills, erythema, induration, drainage, or persistent bleeding.  Images permanently stored and available for review in the ultrasound unit.  Impression: Technically successful ultrasound guided injection.    Impression and Recommendations:     The above documentation has been reviewed and is accurate and complete Lyndal Pulley, DO       Note: This dictation was prepared with Dragon dictation along with smaller phrase technology. Any transcriptional errors that result from this process are unintentional.

## 2020-07-28 NOTE — Assessment & Plan Note (Signed)
Repeat injections given again today. Once again discussed with patient in great length about the idea of this being more of a lumbar radiculopathy.  Patient encouraged to consider the possibility of advanced

## 2020-07-28 NOTE — Patient Instructions (Signed)
Good to see you Pelvic xray today Make an appointment with Dr. Donia Pounds choice fiber and probiotic supplement See me again in 8-10 weeks

## 2020-07-29 MED ORDER — DIPHENOXYLATE-ATROPINE 2.5-0.025 MG PO TABS: 1.0000 | ORAL_TABLET | Freq: Every day | ORAL | 1 refills | Status: DC | PRN

## 2020-07-30 ENCOUNTER — Other Ambulatory Visit: Payer: Self-pay

## 2020-07-30 DIAGNOSIS — K582 Mixed irritable bowel syndrome: Secondary | ICD-10-CM

## 2020-07-30 DIAGNOSIS — K2100 Gastro-esophageal reflux disease with esophagitis, without bleeding: Secondary | ICD-10-CM

## 2020-08-02 ENCOUNTER — Other Ambulatory Visit: Payer: Self-pay | Admitting: Nurse Practitioner

## 2020-08-02 ENCOUNTER — Telehealth: Payer: Self-pay | Admitting: Nurse Practitioner

## 2020-08-02 DIAGNOSIS — F418 Other specified anxiety disorders: Secondary | ICD-10-CM

## 2020-08-02 DIAGNOSIS — K580 Irritable bowel syndrome with diarrhea: Secondary | ICD-10-CM

## 2020-08-02 NOTE — Telephone Encounter (Signed)
Pt would like Rx sent in

## 2020-08-02 NOTE — Telephone Encounter (Signed)
Please inquire if she is willing to try a medication called Xifaxan. It has an antimicrobial effect, hence decrease bacteria overgrowth and gas production which can cause chronic diarrhea and bloating with IBS-diarrhea. This is taken BID x 2weeks. Common side effects are nausea, constipation, and flatulence. She will need to hold lomotil and imodium while taking to medication in order to avoid increasing risk for constipation. Let me know if she will like rx sent. Thank you

## 2020-08-02 NOTE — Telephone Encounter (Signed)
Medication refill request for Alprazolam.   06/30/20 Last OV 06/29/20 Last refill date // #30 // refills - 0  Forwarding refill request to PCP for further review.

## 2020-08-03 MED ORDER — RIFAXIMIN 200 MG PO TABS: 200.0000 mg | ORAL_TABLET | Freq: Three times a day (TID) | ORAL | 1 refills | Status: DC

## 2020-08-03 NOTE — Addendum Note (Signed)
Addended by: Leana Gamer on: 08/03/2020 08:14 AM   Modules accepted: Orders

## 2020-08-04 DIAGNOSIS — Z1159 Encounter for screening for other viral diseases: Secondary | ICD-10-CM | POA: Diagnosis not present

## 2020-08-04 DIAGNOSIS — Z822 Family history of deafness and hearing loss: Secondary | ICD-10-CM | POA: Diagnosis not present

## 2020-08-04 DIAGNOSIS — Z20828 Contact with and (suspected) exposure to other viral communicable diseases: Secondary | ICD-10-CM | POA: Diagnosis not present

## 2020-08-04 DIAGNOSIS — H9313 Tinnitus, bilateral: Secondary | ICD-10-CM | POA: Diagnosis not present

## 2020-08-04 DIAGNOSIS — H903 Sensorineural hearing loss, bilateral: Secondary | ICD-10-CM | POA: Diagnosis not present

## 2020-08-10 ENCOUNTER — Encounter: Payer: Self-pay | Admitting: Nurse Practitioner

## 2020-08-11 DIAGNOSIS — Z1159 Encounter for screening for other viral diseases: Secondary | ICD-10-CM | POA: Diagnosis not present

## 2020-08-11 DIAGNOSIS — Z20828 Contact with and (suspected) exposure to other viral communicable diseases: Secondary | ICD-10-CM | POA: Diagnosis not present

## 2020-08-12 ENCOUNTER — Other Ambulatory Visit: Payer: Self-pay

## 2020-08-12 DIAGNOSIS — H9193 Unspecified hearing loss, bilateral: Secondary | ICD-10-CM

## 2020-08-18 DIAGNOSIS — Z1159 Encounter for screening for other viral diseases: Secondary | ICD-10-CM | POA: Diagnosis not present

## 2020-08-18 DIAGNOSIS — Z20828 Contact with and (suspected) exposure to other viral communicable diseases: Secondary | ICD-10-CM | POA: Diagnosis not present

## 2020-08-19 DIAGNOSIS — Z23 Encounter for immunization: Secondary | ICD-10-CM | POA: Diagnosis not present

## 2020-08-24 DIAGNOSIS — Z1159 Encounter for screening for other viral diseases: Secondary | ICD-10-CM | POA: Diagnosis not present

## 2020-08-24 DIAGNOSIS — Z20828 Contact with and (suspected) exposure to other viral communicable diseases: Secondary | ICD-10-CM | POA: Diagnosis not present

## 2020-08-27 DIAGNOSIS — Z20828 Contact with and (suspected) exposure to other viral communicable diseases: Secondary | ICD-10-CM | POA: Diagnosis not present

## 2020-08-27 DIAGNOSIS — Z1159 Encounter for screening for other viral diseases: Secondary | ICD-10-CM | POA: Diagnosis not present

## 2020-08-30 ENCOUNTER — Other Ambulatory Visit: Payer: Self-pay | Admitting: Nurse Practitioner

## 2020-08-30 DIAGNOSIS — K2100 Gastro-esophageal reflux disease with esophagitis, without bleeding: Secondary | ICD-10-CM

## 2020-09-01 ENCOUNTER — Other Ambulatory Visit: Payer: Self-pay | Admitting: Nurse Practitioner

## 2020-09-01 DIAGNOSIS — F418 Other specified anxiety disorders: Secondary | ICD-10-CM

## 2020-09-02 ENCOUNTER — Telehealth: Payer: Self-pay

## 2020-09-02 NOTE — Progress Notes (Signed)
Spoke to patient to confirmed patient telephone appointment on 09/03/2020 for CCM at 10:30 Am with Junius Argyle the Clinical pharmacist.   Patient states to cancel her appointment on 09/03/2020.Patient states she does not want to reschedule appointment at this time.  Patient denies any side effects from current medication.  [place] Pharmacist Assistant [phone]

## 2020-09-03 ENCOUNTER — Telehealth: Payer: Medicare Other

## 2020-09-03 DIAGNOSIS — Z1159 Encounter for screening for other viral diseases: Secondary | ICD-10-CM | POA: Diagnosis not present

## 2020-09-03 DIAGNOSIS — Z20828 Contact with and (suspected) exposure to other viral communicable diseases: Secondary | ICD-10-CM | POA: Diagnosis not present

## 2020-09-07 ENCOUNTER — Ambulatory Visit: Payer: Medicare Other | Admitting: Nurse Practitioner

## 2020-09-07 DIAGNOSIS — Z20828 Contact with and (suspected) exposure to other viral communicable diseases: Secondary | ICD-10-CM | POA: Diagnosis not present

## 2020-09-07 DIAGNOSIS — Z1159 Encounter for screening for other viral diseases: Secondary | ICD-10-CM | POA: Diagnosis not present

## 2020-09-10 DIAGNOSIS — Z1159 Encounter for screening for other viral diseases: Secondary | ICD-10-CM | POA: Diagnosis not present

## 2020-09-10 DIAGNOSIS — Z20828 Contact with and (suspected) exposure to other viral communicable diseases: Secondary | ICD-10-CM | POA: Diagnosis not present

## 2020-09-13 ENCOUNTER — Encounter: Payer: Self-pay | Admitting: Nurse Practitioner

## 2020-09-13 ENCOUNTER — Other Ambulatory Visit: Payer: Self-pay

## 2020-09-13 ENCOUNTER — Ambulatory Visit (INDEPENDENT_AMBULATORY_CARE_PROVIDER_SITE_OTHER): Payer: Medicare Other | Admitting: Nurse Practitioner

## 2020-09-13 VITALS — BP 124/62 | HR 68 | Temp 97.6°F | Wt 106.0 lb

## 2020-09-13 DIAGNOSIS — I10 Essential (primary) hypertension: Secondary | ICD-10-CM | POA: Diagnosis not present

## 2020-09-13 DIAGNOSIS — I872 Venous insufficiency (chronic) (peripheral): Secondary | ICD-10-CM

## 2020-09-13 MED ORDER — FUROSEMIDE 20 MG PO TABS: 20.0000 mg | ORAL_TABLET | Freq: Every day | ORAL | 1 refills | Status: DC

## 2020-09-13 MED ORDER — POTASSIUM CHLORIDE CRYS ER 20 MEQ PO TBCR: 20.0000 meq | EXTENDED_RELEASE_TABLET | Freq: Every day | ORAL | 1 refills | Status: DC

## 2020-09-13 NOTE — Progress Notes (Signed)
Subjective:  Patient ID: Susan [name], female    DOB: [birth year]  Age: 86 y.o. MRN: [id number]  CC: Follow-up (3 month f/u on HTN, Hyperlipidemia/Pt has been checking BPs at home and has a list with her today. )  HPI  Essential hypertension BP at goal Home BP readings: 128/66, 136/68, 121/70, 129/74, 116/59, 103/62, 128/68 No dizziness, headache, SOB, or palpitations. BP Readings from Last 3 Encounters:  09/13/20 124/62  07/28/20 130/74  06/10/20 138/82   Maintain current medications F/up in 78months  Venous insufficiency of both lower extremities Stable with use of furosemide and potassium  Wt Readings from Last 3 Encounters:  09/13/20 106 lb (48.1 kg)  07/28/20 104 lb (47.2 kg)  06/10/20 103 lb 9.6 oz (47 kg)   BP Readings from Last 3 Encounters:  09/13/20 124/62  07/28/20 130/74  06/10/20 138/82    Reviewed past Medical, Social and Family history today.  Outpatient Medications Prior to Visit  Medication Sig Dispense Refill  . acetaminophen (TYLENOL) 650 MG CR tablet Take 650 mg by mouth every 8 (eight) hours as needed for pain.    Marland Kitchen ALPRAZolam (XANAX) 0.5 MG tablet TAKE 1 TABLET BY MOUTH ONCE DAILY AS NEEDED FOR ANXIETY 30 tablet 0  . amLODipine (NORVASC) 5 MG tablet Take 2 tablets (10 mg total) by mouth at bedtime. 90 tablet 3  . Apoaequorin (PREVAGEN) 10 MG CAPS Take 10 mg by mouth daily.    Marland Kitchen aspirin EC 325 MG tablet Take 325 mg by mouth daily.    . cholestyramine (QUESTRAN) 4 g packet DISSOLVE & TAKE 1 POWDER BY MOUTH ONCE DAILY AS NEEDED *NEED  OFFICE  VISIT* 60 each 0  . dicyclomine (BENTYL) 10 MG capsule Take 1 capsule (10 mg total) by mouth 2 (two) times daily. 180 capsule 3  . diphenoxylate-atropine (LOMOTIL) 2.5-0.025 MG tablet Take 1 tablet by mouth daily as needed for diarrhea or loose stools (>4large watery stools per day). 15 tablet 1  . famotidine (PEPCID) 20 MG tablet Take 1 tablet by mouth once daily 90 tablet 0  . fexofenadine (ALLEGRA) 180 MG  tablet Take 180 mg by mouth daily.    . IRON PO Take 65 mg by mouth 3 (three) times a week.    . losartan (COZAAR) 100 MG tablet Take 1 tablet (100 mg total) by mouth daily. 90 tablet 3  . meclizine (ANTIVERT) 25 MG tablet Take 1 tablet (25 mg total) by mouth 3 (three) times daily as needed for dizziness. 30 tablet 0  . metoprolol succinate (TOPROL-XL) 25 MG 24 hr tablet Take 1 tablet (25 mg total) by mouth daily. 90 tablet 3  . rifaximin (XIFAXAN) 200 MG tablet Take 1 tablet (200 mg total) by mouth 3 (three) times daily. 21 tablet 1  . Vitamin D, Ergocalciferol, (DRISDOL) 1.25 MG (50000 UNIT) CAPS capsule Take 1 capsule by mouth once a week 12 capsule 0  . furosemide (LASIX) 20 MG tablet Take 1 tablet by mouth once daily 90 tablet 0  . potassium chloride SA (KLOR-CON) 20 MEQ tablet Take 1 tablet (20 mEq total) by mouth daily. (Patient taking differently: Take 20 mEq by mouth once a week. Takes every saturday) 90 tablet 1   No facility-administered medications prior to visit.    ROS See HPI  Objective:  BP 124/62 (BP Location: Left Arm, Patient Position: Sitting, Cuff Size: Normal)   Pulse 68   Temp 97.6 F (36.4 C) (Temporal)   Wt 106 lb (48.1 kg)  SpO2 98%   BMI 20.03 kg/m   Physical Exam Cardiovascular:     Rate and Rhythm: Normal rate and regular rhythm.     Heart sounds: Murmur heard.   Pulmonary:     Effort: Pulmonary effort is normal.     Breath sounds: Normal breath sounds.  Abdominal:     General: There is no distension.     Palpations: Abdomen is soft.     Tenderness: There is no abdominal tenderness.  Musculoskeletal:     Right lower leg: Edema present.     Left lower leg: Edema present.  Skin:    Findings: No erythema.  Neurological:     Mental Status: She is alert and oriented to person, place, and time.  Psychiatric:        Mood and Affect: Mood normal.        Behavior: Behavior normal.        Thought Content: Thought content normal.    Assessment &  Plan:  This visit occurred during the SARS-CoV-2 public health emergency.  Safety protocols were in place, including screening questions prior to the visit, additional usage of staff PPE, and extensive cleaning of exam room while observing appropriate contact time as indicated for disinfecting solutions.   Susan [name] was seen today for follow-up.  Diagnoses and all orders for this visit:  Essential hypertension -     furosemide (LASIX) 20 MG tablet; Take 1 tablet (20 mg total) by mouth daily.  Venous insufficiency of both lower extremities -     furosemide (LASIX) 20 MG tablet; Take 1 tablet (20 mg total) by mouth daily. -     potassium chloride SA (KLOR-CON) 20 MEQ tablet; Take 1 tablet (20 mEq total) by mouth daily.   Problem List Items Addressed This Visit      Cardiovascular and Mediastinum   Essential hypertension - Primary    BP at goal Home BP readings: 128/66, 136/68, 121/70, 129/74, 116/59, 103/62, 128/68 No dizziness, headache, SOB, or palpitations. BP Readings from Last 3 Encounters:  09/13/20 124/62  07/28/20 130/74  06/10/20 138/82   Maintain current medications F/up in 9months      Relevant Medications   furosemide (LASIX) 20 MG tablet   Venous insufficiency of both lower extremities    Stable with use of furosemide and potassium      Relevant Medications   furosemide (LASIX) 20 MG tablet   potassium chloride SA (KLOR-CON) 20 MEQ tablet      Follow-up: Return in about 6 months (around 03/14/2021) for AWV and HTN f/up (F2F, 40mins, fasting-lipid, BMP, CBC).  Wilfred Lacy, NP

## 2020-09-13 NOTE — Assessment & Plan Note (Addendum)
BP at goal Home BP readings: 128/66, 136/68, 121/70, 129/74, 116/59, 103/62, 128/68 No dizziness, headache, SOB, or palpitations. BP Readings from Last 3 Encounters:  09/13/20 124/62  07/28/20 130/74  06/10/20 138/82   Maintain current medications F/up in 61months

## 2020-09-13 NOTE — Assessment & Plan Note (Signed)
Stable with use of furosemide and potassium

## 2020-09-14 DIAGNOSIS — Z1159 Encounter for screening for other viral diseases: Secondary | ICD-10-CM | POA: Diagnosis not present

## 2020-09-14 DIAGNOSIS — Z20828 Contact with and (suspected) exposure to other viral communicable diseases: Secondary | ICD-10-CM | POA: Diagnosis not present

## 2020-09-17 DIAGNOSIS — Z1159 Encounter for screening for other viral diseases: Secondary | ICD-10-CM | POA: Diagnosis not present

## 2020-09-17 DIAGNOSIS — Z20828 Contact with and (suspected) exposure to other viral communicable diseases: Secondary | ICD-10-CM | POA: Diagnosis not present

## 2020-09-21 DIAGNOSIS — Z1159 Encounter for screening for other viral diseases: Secondary | ICD-10-CM | POA: Diagnosis not present

## 2020-09-21 DIAGNOSIS — Z20828 Contact with and (suspected) exposure to other viral communicable diseases: Secondary | ICD-10-CM | POA: Diagnosis not present

## 2020-09-22 ENCOUNTER — Ambulatory Visit: Payer: Self-pay

## 2020-09-22 ENCOUNTER — Ambulatory Visit (INDEPENDENT_AMBULATORY_CARE_PROVIDER_SITE_OTHER): Payer: Medicare Other | Admitting: Family Medicine

## 2020-09-22 ENCOUNTER — Other Ambulatory Visit: Payer: Self-pay

## 2020-09-22 ENCOUNTER — Encounter: Payer: Self-pay | Admitting: Family Medicine

## 2020-09-22 VITALS — BP 130/70 | HR 71 | Ht 61.0 in | Wt 109.0 lb

## 2020-09-22 DIAGNOSIS — M7061 Trochanteric bursitis, right hip: Secondary | ICD-10-CM

## 2020-09-22 DIAGNOSIS — M5416 Radiculopathy, lumbar region: Secondary | ICD-10-CM | POA: Diagnosis not present

## 2020-09-22 DIAGNOSIS — M7062 Trochanteric bursitis, left hip: Secondary | ICD-10-CM | POA: Diagnosis not present

## 2020-09-22 DIAGNOSIS — M25552 Pain in left hip: Secondary | ICD-10-CM | POA: Diagnosis not present

## 2020-09-22 DIAGNOSIS — M25551 Pain in right hip: Secondary | ICD-10-CM | POA: Diagnosis not present

## 2020-09-22 NOTE — Assessment & Plan Note (Addendum)
Continued recurring bursitis of the hips bilaterally.  Patient has had this for so many times.  No signs of any infectious etiology but patient does have the overlying atrophy.  We have discussed multiple times about different treatment options.  Patient has finally elected to try an epidural of the back with some of the radicular symptoms she is having in the L4-L5 distribution.  We will have that done.  Patient does still get significant benefit with these injections on the sides of the hip.  Patient's x-rays of the hip did not show any significant bony abnormality on the lateral aspect of the hip but does have moderate arthritic changes of the hips patient would not want any surgical intervention but I am trying to improve patient's quality of life.  Patient understands the risks and benefits at this time and will continue with current therapy follow-up again in 8 to 10 weeks.  Regarding patient's pain patient does state that over the course of the last 3 and half years or she has any significant improvement from where she was previously she believes.  Did call patient's daughter and discussed with her to to see if there was any other possibilities or concerns that they had and stated that they would just want to keep mom ambulating as best they can.

## 2020-09-22 NOTE — Patient Instructions (Addendum)
Good to see you. Hip injections given today. Epidural order is placed they will call to schedule.  See me again in 8-10weeks

## 2020-09-22 NOTE — Progress Notes (Signed)
[name] [place] Phone: [phone] Subjective:   Susan [name], [name]  I'm seeing this patient by the request  of:  Nche, Charlene Brooke, NP  CC: Bilateral hip and back pain  JJO:[id number]   07/28/2020 Repeat injections given again today. Once again discussed with patient in great length about the idea of this being more of a lumbar radiculopathy.  Patient encouraged to consider the possibility of advanced  Update 09/22/2020 Susan [name] is a 86 y.o. female coming in with complaint of bilateral hip pain. Patient states states everything was going pretty well until the last couple of weeks and now the hips are bothering her pretty bad. States pain radiates down her legs. Wanting an injection on the side of the hips again.  Patient is wondering if she needs to do something about her back.  Patient is willing to consider the possibility of an epidural at this time.      Past Medical History:  Diagnosis Date  . Actinic keratosis, hx of    chest wall (2012)  . Allergic rhinitis   . Anxiety   . Arthritis    "spine, hips" (01/30/2017)  . Bursitis   . Cellulitis of right foot 01/30/2017  . Colon polyps   . Diverticulosis   . Esophageal dysmotilities 10/16/2017  . Fibromyalgia    "qwhere" (01/30/2017)  . Gallstones   . GERD (gastroesophageal reflux disease)   . High cholesterol   . History of kidney stones   . History of stomach ulcers   . Hx of squamous cell carcinoma excision    right mandible (2011), right arm (2014)  . Hypertension   . IBS (irritable bowel syndrome)   . Osteoarthritis   . Pneumonia 2000s X 2   "twice" (01/30/2017)  . Pyloric stenosis   . Stroke [facility]) 10/2014   denies residual on 01/30/2017   Past Surgical History:  Procedure Laterality Date  . APPENDECTOMY    . CARDIOVASCULAR STRESS TEST  04/2016   nuclear cardiolite stress test done in  [place] (no ischemia, no LVH, no LV systolic function)  . CATARACT EXTRACTION W/ INTRAOCULAR LENS  IMPLANT, BILATERAL Bilateral   . CHOLECYSTECTOMY OPEN    . COLONOSCOPY  2009, 2014  . DIAGNOSTIC MAMMOGRAM  2009, 2012  . TONSILLECTOMY     Social History   Socioeconomic History  . Marital status: Widowed    Spouse name: Not on file  . Number of children: 4  . Years of education: Not on file  . Highest education level: Not on file  Occupational History  . Occupation: retired  Tobacco Use  . Smoking status: Never Smoker  . Smokeless tobacco: Never Used  Vaping Use  . Vaping Use: Never used  Substance and Sexual Activity  . Alcohol use: Yes    Comment: glass of red wine maybe once week  . Drug use: No  . Sexual activity: Not on file  Other Topics Concern  . Not on file  Social History Narrative  . Not on file   Social Determinants of Health   Financial Resource Strain:   . Difficulty of Paying Living Expenses: Not on file  Food Insecurity:   . Worried About Charity fundraiser in the Last Year: Not on file  . Ran Out of Food in the Last Year: Not on file  Transportation Needs:   . Lack of Transportation (Medical):  Not on file  . Lack of Transportation (Non-Medical): Not on file  Physical Activity:   . Days of Exercise per Week: Not on file  . Minutes of Exercise per Session: Not on file  Stress:   . Feeling of Stress : Not on file  Social Connections:   . Frequency of Communication with Friends and Family: Not on file  . Frequency of Social Gatherings with Friends and Family: Not on file  . Attends Religious Services: Not on file  . Active Member of Clubs or Organizations: Not on file  . Attends Archivist Meetings: Not on file  . Marital Status: Not on file   Allergies  Allergen Reactions  . Butrans [Buprenorphine] Other (See Comments)    Severe chest pains  . Codeine Other (See Comments)    Severe Chest pains  . Morphine And Related Other (See  Comments)    Severe chest pains   Family History  Problem Relation Age of Onset  . Stroke Mother   . Heart attack Mother   . Stroke Father   . Heart attack Father   . Heart attack Maternal Grandmother   . Stroke Maternal Grandmother   . Stroke Paternal Grandmother   . Heart attack Paternal Grandmother   . Heart attack Maternal Grandfather   . Stroke Maternal Grandfather   . Stroke Paternal Grandfather   . Heart attack Paternal Grandfather   . Colon cancer Neg Hx   . Pancreatic cancer Neg Hx   . Stomach cancer Neg Hx   . Esophageal cancer Neg Hx      Current Outpatient Medications (Cardiovascular):  .  amLODipine (NORVASC) 5 MG tablet, Take 2 tablets (10 mg total) by mouth at bedtime. .  cholestyramine (QUESTRAN) 4 g packet, DISSOLVE & TAKE 1 POWDER BY MOUTH ONCE DAILY AS NEEDED *NEED  OFFICE  VISIT* .  furosemide (LASIX) 20 MG tablet, Take 1 tablet (20 mg total) by mouth daily. Marland Kitchen  losartan (COZAAR) 100 MG tablet, Take 1 tablet (100 mg total) by mouth daily. .  metoprolol succinate (TOPROL-XL) 25 MG 24 hr tablet, Take 1 tablet (25 mg total) by mouth daily.  Current Outpatient Medications (Respiratory):  .  fexofenadine (ALLEGRA) 180 MG tablet, Take 180 mg by mouth daily.  Current Outpatient Medications (Analgesics):  .  acetaminophen (TYLENOL) 650 MG CR tablet, Take 650 mg by mouth every 8 (eight) hours as needed for pain. Marland Kitchen  aspirin EC 325 MG tablet, Take 325 mg by mouth daily.  Current Outpatient Medications (Hematological):  Marland Kitchen  IRON PO, Take 65 mg by mouth 3 (three) times a week.  Current Outpatient Medications (Other):  Marland Kitchen  ALPRAZolam (XANAX) 0.5 MG tablet, TAKE 1 TABLET BY MOUTH ONCE DAILY AS NEEDED FOR ANXIETY .  Apoaequorin (PREVAGEN) 10 MG CAPS, Take 10 mg by mouth daily. Marland Kitchen  dicyclomine (BENTYL) 10 MG capsule, Take 1 capsule (10 mg total) by mouth 2 (two) times daily. .  diphenoxylate-atropine (LOMOTIL) 2.5-0.025 MG tablet, Take 1 tablet by mouth daily as needed for  diarrhea or loose stools (>4large watery stools per day). .  famotidine (PEPCID) 20 MG tablet, Take 1 tablet by mouth once daily .  meclizine (ANTIVERT) 25 MG tablet, Take 1 tablet (25 mg total) by mouth 3 (three) times daily as needed for dizziness. .  potassium chloride SA (KLOR-CON) 20 MEQ tablet, Take 1 tablet (20 mEq total) by mouth daily. .  rifaximin (XIFAXAN) 200 MG tablet, Take 1 tablet (200 mg total)  by mouth 3 (three) times daily. .  Vitamin D, Ergocalciferol, (DRISDOL) 1.25 MG (50000 UNIT) CAPS capsule, Take 1 capsule by mouth once a week   Reviewed prior external information including notes and imaging from  primary care provider As well as notes that were available from care everywhere and other healthcare systems.  Past medical history, social, surgical and family history all reviewed in electronic medical record.  No pertanent information unless stated regarding to the chief complaint.   Review of Systems:  No headache, visual changes, nausea, vomiting, diarrhea, constipation, dizziness, abdominal pain, skin rash, fevers, chills, night sweats, weight loss, swollen lymph nodes, body aches, joint swelling, chest pain, shortness of breath, mood changes. POSITIVE muscle aches  Objective  Blood pressure 130/70, pulse 71, height 5\' 1"  (1.549 m), weight 109 lb (49.4 kg), SpO2 97 %.   General: No apparent distress alert and oriented x3 mood and affect normal, dressed appropriately.  HEENT: Pupils equal, extraocular movements intact  Respiratory: Patient's speak in full sentences and does not appear short of breath  Cardiovascular: No lower extremity edema, non tender, no erythema  Patient ambulates with the aid of a rolling walker.  Significantly antalgic Severe pain over the greater trochanteric areas bilaterally.  Minimal pain no in the groin with internal rotation of the hips.  Patient does have atrophy on the lateral aspect of the hips bilaterally. Low back exam does have loss  of lordosis with some degenerative scoliosis and diffuse tenderness to palpate in the paraspinal musculature.    Procedure: Real-time Ultrasound Guided Injection of right greater trochanteric bursitis secondary to patient's body habitus Device: GE Logiq Q7 Ultrasound guided injection is preferred based studies that show increased duration, increased effect, greater accuracy, decreased procedural pain, increased response rate, and decreased cost with ultrasound guided versus blind injection.  Verbal informed consent obtained.  Time-out conducted.  Noted no overlying erythema, induration, or other signs of local infection.  Skin prepped in a sterile fashion.  Local anesthesia: Topical Ethyl chloride.  With sterile technique and under real time ultrasound guidance:  Greater trochanteric area was visualized and patient's bursa was noted. A 22-gauge 3 inch needle was inserted and 4 cc of 0.5% Marcaine and 1 cc of Kenalog 40 mg/dL was injected. Pictures taken Completed without difficulty  Pain immediately resolved suggesting accurate placement of the medication.  Advised to call if fevers/chills, erythema, induration, drainage, or persistent bleeding.  Images permanently stored and available for review in the ultrasound unit.  Impression: Technically successful ultrasound guided injection.   Procedure: Real-time Ultrasound Guided Injection of left  greater trochanteric bursitis secondary to patient's body habitus Device: GE Logiq Q7  Ultrasound guided injection is preferred based studies that show increased duration, increased effect, greater accuracy, decreased procedural pain, increased response rate, and decreased cost with ultrasound guided versus blind injection.  Verbal informed consent obtained.  Time-out conducted.  Noted no overlying erythema, induration, or other signs of local infection.  Skin prepped in a sterile fashion.  Local anesthesia: Topical Ethyl chloride.  With sterile  technique and under real time ultrasound guidance:  Greater trochanteric area was visualized and patient's bursa was noted. A 22-gauge 3 inch needle was inserted and 4 cc of 0.5% Marcaine and 1 cc of Kenalog 40 mg/dL was injected. Pictures taken Completed without difficulty  Pain immediately resolved suggesting accurate placement of the medication.  Advised to call if fevers/chills, erythema, induration, drainage, or persistent bleeding.  Images permanently stored and available for review in  the ultrasound unit.  Impression: Technically successful ultrasound guided injection.   Impression and Recommendations:     The above documentation has been reviewed and is accurate and complete Lyndal Pulley, DO

## 2020-09-22 NOTE — Assessment & Plan Note (Signed)
Patient does have some signs and symptoms of lumbar radiculopathy.  Does have known spondylolisthesis with degenerative disc disease of the lumbar spine at L4-L5.  I would like to try an epidural potentially to help with some of the discomfort and pain.  Concern with adding any other medicines especially anything that would increase her risk of falls.  Follow-up with me again 8 to 10 weeks

## 2020-09-24 DIAGNOSIS — Z20828 Contact with and (suspected) exposure to other viral communicable diseases: Secondary | ICD-10-CM | POA: Diagnosis not present

## 2020-09-24 DIAGNOSIS — Z1159 Encounter for screening for other viral diseases: Secondary | ICD-10-CM | POA: Diagnosis not present

## 2020-09-28 DIAGNOSIS — Z20828 Contact with and (suspected) exposure to other viral communicable diseases: Secondary | ICD-10-CM | POA: Diagnosis not present

## 2020-09-28 DIAGNOSIS — Z1159 Encounter for screening for other viral diseases: Secondary | ICD-10-CM | POA: Diagnosis not present

## 2020-10-01 ENCOUNTER — Other Ambulatory Visit: Payer: Self-pay | Admitting: Nurse Practitioner

## 2020-10-01 ENCOUNTER — Other Ambulatory Visit: Payer: Self-pay | Admitting: Family Medicine

## 2020-10-01 DIAGNOSIS — F418 Other specified anxiety disorders: Secondary | ICD-10-CM

## 2020-10-01 DIAGNOSIS — Z1159 Encounter for screening for other viral diseases: Secondary | ICD-10-CM | POA: Diagnosis not present

## 2020-10-01 DIAGNOSIS — Z20828 Contact with and (suspected) exposure to other viral communicable diseases: Secondary | ICD-10-CM | POA: Diagnosis not present

## 2020-10-01 NOTE — Telephone Encounter (Signed)
Requesting:xanax Contract:no UDS:no Last OV:09/13/20 Next OV:03/14/21 Last Refill:09/01/20  #[id number]    Please advise

## 2020-10-05 DIAGNOSIS — Z20828 Contact with and (suspected) exposure to other viral communicable diseases: Secondary | ICD-10-CM | POA: Diagnosis not present

## 2020-10-05 DIAGNOSIS — Z1159 Encounter for screening for other viral diseases: Secondary | ICD-10-CM | POA: Diagnosis not present

## 2020-10-06 ENCOUNTER — Other Ambulatory Visit: Payer: Self-pay

## 2020-10-06 ENCOUNTER — Encounter (HOSPITAL_BASED_OUTPATIENT_CLINIC_OR_DEPARTMENT_OTHER): Payer: Self-pay | Admitting: *Deleted

## 2020-10-06 ENCOUNTER — Emergency Department (HOSPITAL_BASED_OUTPATIENT_CLINIC_OR_DEPARTMENT_OTHER)
Admission: EM | Admit: 2020-10-06 | Discharge: 2020-10-06 | Disposition: A | Discharge status: Home / Self Care | Payer: Medicare Other | Attending: Emergency Medicine | Admitting: Emergency Medicine

## 2020-10-06 DIAGNOSIS — Z85828 Personal history of other malignant neoplasm of skin: Secondary | ICD-10-CM | POA: Insufficient documentation

## 2020-10-06 DIAGNOSIS — S59902A Unspecified injury of left elbow, initial encounter: Secondary | ICD-10-CM | POA: Diagnosis present

## 2020-10-06 DIAGNOSIS — W228XXA Striking against or struck by other objects, initial encounter: Secondary | ICD-10-CM | POA: Diagnosis not present

## 2020-10-06 DIAGNOSIS — Z79899 Other long term (current) drug therapy: Secondary | ICD-10-CM | POA: Diagnosis not present

## 2020-10-06 DIAGNOSIS — Z86010 Personal history of colonic polyps: Secondary | ICD-10-CM | POA: Diagnosis not present

## 2020-10-06 DIAGNOSIS — Z7982 Long term (current) use of aspirin: Secondary | ICD-10-CM | POA: Diagnosis not present

## 2020-10-06 DIAGNOSIS — S51012A Laceration without foreign body of left elbow, initial encounter: Secondary | ICD-10-CM | POA: Diagnosis not present

## 2020-10-06 NOTE — ED Provider Notes (Signed)
[facility] EMERGENCY DEPARTMENT Provider Note   CSN: [id number] Arrival date & time: 10/06/20  1139     History Chief Complaint  Patient presents with  . Skin Tear    Susan [name] is a 86 y.o. female.  HPI   Patient presented to the ED for evaluation of a laceration to her left arm. Sustained a laceration to her left elbow region yesterday when she hit it on the shower door. Patient applied local wound care herself. This morning when she took the bandage off it stuck to the wound and started bleeding again. Her family brought her to the ED for evaluation. Patient does have history of poor wound healing in the past.  Past Medical History:  Diagnosis Date  . Actinic keratosis, hx of    chest wall (2012)  . Allergic rhinitis   . Anxiety   . Arthritis    "spine, hips" (01/30/2017)  . Bursitis   . Cellulitis of right foot 01/30/2017  . Colon polyps   . Diverticulosis   . Esophageal dysmotilities 10/16/2017  . Fibromyalgia    "qwhere" (01/30/2017)  . Gallstones   . GERD (gastroesophageal reflux disease)   . High cholesterol   . History of kidney stones   . History of stomach ulcers   . Hx of squamous cell carcinoma excision    right mandible (2011), right arm (2014)  . Hypertension   . IBS (irritable bowel syndrome)   . Osteoarthritis   . Pneumonia 2000s X 2   "twice" (01/30/2017)  . Pyloric stenosis   . Stroke [facility]) 10/2014   denies residual on 01/30/2017    Patient Active Problem List   Diagnosis Date Noted  . Lumbar radiculopathy 09/22/2020  . Tibialis posterior tendinitis, right 11/05/2019  . DNR (do not resuscitate) discussion 02/20/2019  . Anemia associated with acute blood loss 12/09/2018  . Lymphangitis 12/06/2018  . Chronic diarrhea 12/03/2018  . Venous insufficiency of both lower extremities 11/06/2018  . Hyperglycemia 05/01/2018  . Contusion of left hip 04/22/2018  . Esophageal dysmotilities 10/16/2017  . Anxiety about health 05/16/2017  .  Hearing loss 05/16/2017  . Greater trochanteric bursitis of both hips 04/24/2017  . Sicca syndrome, unspecified 04/06/2017  . Pyloric stenosis 04/06/2017  . Hyperlipidemia 04/06/2017  . Macular degeneration 04/06/2017  . Fibromyalgia 03/21/2017  . History of CVA (cerebrovascular accident) without residual deficits 03/21/2017  . IBS (irritable bowel syndrome) 03/16/2017  . Dysphagia 03/16/2017  . GERD with esophagitis 03/16/2017  . Arthritis of hip 02/13/2017  . Essential hypertension 01/30/2017  . Elevated troponin 01/30/2017    Past Surgical History:  Procedure Laterality Date  . APPENDECTOMY    . CARDIOVASCULAR STRESS TEST  04/2016   nuclear cardiolite stress test done in [place] (no ischemia, no LVH, no LV systolic function)  . CATARACT EXTRACTION W/ INTRAOCULAR LENS  IMPLANT, BILATERAL Bilateral   . CHOLECYSTECTOMY OPEN    . COLONOSCOPY  2009, 2014  . DIAGNOSTIC MAMMOGRAM  2009, 2012  . TONSILLECTOMY       OB History    Gravida  4   Para  4   Term      Preterm      AB      Living        SAB      TAB      Ectopic      Multiple      Live Births  Family History  Problem Relation Age of Onset  . Stroke Mother   . Heart attack Mother   . Stroke Father   . Heart attack Father   . Heart attack Maternal Grandmother   . Stroke Maternal Grandmother   . Stroke Paternal Grandmother   . Heart attack Paternal Grandmother   . Heart attack Maternal Grandfather   . Stroke Maternal Grandfather   . Stroke Paternal Grandfather   . Heart attack Paternal Grandfather   . Colon cancer Neg Hx   . Pancreatic cancer Neg Hx   . Stomach cancer Neg Hx   . Esophageal cancer Neg Hx     Social History   Tobacco Use  . Smoking status: Never Smoker  . Smokeless tobacco: Never Used  Vaping Use  . Vaping Use: Never used  Substance Use Topics  . Alcohol use: Yes    Comment: glass of red wine maybe once week  . Drug use: No    Home  Medications Prior to Admission medications   Medication Sig Start Date End Date Taking? Authorizing Provider  acetaminophen (TYLENOL) 650 MG CR tablet Take 650 mg by mouth every 8 (eight) hours as needed for pain.    [provider]  ALPRAZolam (XANAX) 0.5 MG tablet TAKE 1 TABLET BY MOUTH ONCE DAILY AS NEEDED FOR ANXIETY 10/04/20   Nche, Charlene Brooke, NP  amLODipine (NORVASC) 5 MG tablet Take 2 tablets (10 mg total) by mouth at bedtime. 07/12/20   Nche, Charlene Brooke, NP  Apoaequorin (PREVAGEN) 10 MG CAPS Take 10 mg by mouth daily.    [provider]  aspirin EC 325 MG tablet Take 325 mg by mouth daily.    [provider]  cholestyramine (QUESTRAN) 4 g packet DISSOLVE & TAKE 1 POWDER BY MOUTH ONCE DAILY AS NEEDED *NEED  OFFICE  VISIT* 08/31/20   Nche, Charlene Brooke, NP  dicyclomine (BENTYL) 10 MG capsule Take 1 capsule (10 mg total) by mouth 2 (two) times daily. 06/10/20   Nche, Charlene Brooke, NP  diphenoxylate-atropine (LOMOTIL) 2.5-0.025 MG tablet Take 1 tablet by mouth daily as needed for diarrhea or loose stools (>4large watery stools per day). 07/29/20   Nche, Charlene Brooke, NP  famotidine (PEPCID) 20 MG tablet Take 1 tablet by mouth once daily 08/31/20   Nche, Charlene Brooke, NP  fexofenadine (ALLEGRA) 180 MG tablet Take 180 mg by mouth daily.    [provider]  furosemide (LASIX) 20 MG tablet Take 1 tablet (20 mg total) by mouth daily. 09/13/20   Nche, Charlene Brooke, NP  IRON PO Take 65 mg by mouth 3 (three) times a week.    [provider]  losartan (COZAAR) 100 MG tablet Take 1 tablet (100 mg total) by mouth daily. 12/31/19   Nche, Charlene Brooke, NP  meclizine (ANTIVERT) 25 MG tablet Take 1 tablet (25 mg total) by mouth 3 (three) times daily as needed for dizziness. 05/19/20   Maudie Flakes, MD  metoprolol succinate (TOPROL-XL) 25 MG 24 hr tablet Take 1 tablet (25 mg total) by mouth daily. 06/02/20   Nche, Charlene Brooke, NP  potassium chloride SA  (KLOR-CON) 20 MEQ tablet Take 1 tablet (20 mEq total) by mouth daily. 09/13/20   Nche, Charlene Brooke, NP  rifaximin (XIFAXAN) 200 MG tablet Take 1 tablet (200 mg total) by mouth 3 (three) times daily. 08/03/20   Nche, Charlene Brooke, NP  Vitamin D, Ergocalciferol, (DRISDOL) 1.25 MG (50000 UNIT) CAPS capsule Take 1 capsule by  mouth once a week 10/01/20   Lyndal Pulley, DO    Allergies    Butrans [buprenorphine], Codeine, and Morphine and related  Review of Systems   Review of Systems  Constitutional: Negative for fever.  Musculoskeletal: Negative for joint swelling.  Neurological: Negative for weakness and numbness.    Physical Exam Updated Vital Signs BP (!) 155/72 (BP Location: Right Arm)   Pulse 66   Temp 98.4 F (36.9 C) (Oral)   Resp 16   Ht 1.575 m (5\' 2" )   Wt 48.1 kg   SpO2 98%   BMI 19.39 kg/m   Physical Exam Vitals and nursing note reviewed.  Constitutional:      Appearance: She is well-developed.     Comments: Elderly, frail  HENT:     Head: Normocephalic and atraumatic.     Right Ear: External ear normal.     Left Ear: External ear normal.  Eyes:     General: No scleral icterus.       Right eye: No discharge.        Left eye: No discharge.     Conjunctiva/sclera: Conjunctivae normal.  Neck:     Trachea: No tracheal deviation.  Cardiovascular:     Rate and Rhythm: Normal rate.  Pulmonary:     Effort: Pulmonary effort is normal. No respiratory distress.     Breath sounds: No stridor.  Abdominal:     General: There is no distension.  Musculoskeletal:        General: No swelling or deformity.     Cervical back: Neck supple.     Comments: Laceration left elbow, superficial skin avulsion type wound, approximately 4 cm in size,   Skin:    General: Skin is warm and dry.     Findings: No rash.  Neurological:     Mental Status: She is alert.     Cranial Nerves: Cranial nerve deficit: no gross deficits.     ED Results / Procedures / Treatments    Labs (all labs ordered are listed, but only abnormal results are displayed) Labs Reviewed - No data to display  EKG None  Radiology No results found.  Procedures Procedures (including critical care time) Local wound care provided by myself. Superficial epithelial layer was reapproximated using forceps. Wound was irrigated. Antibiotic ointment and nonadhesive dressing was applied. Medications Ordered in ED Medications - No data to display  ED Course  I have reviewed the triage vital signs and the nursing notes.  Pertinent labs & imaging results that were available during my care of the patient were reviewed by me and considered in my medical decision making (see chart for details).    MDM Rules/Calculators/A&P                          Patient has a superficial skin tear that is not amenable to suturing. I did reapproximate the superficial epithelial layer and applied nonadhesive bandage. Family was concerned about patient's ability to manage these wounds on her own at home. She has a history of very prolonged injury that required wound care treatment. We will ask for home health assessment. Discussed outpatient follow-up with PCP. I demonstrated to the family and the patient how to apply antibiotic ointment and nonadhesive dressing to the wound. Final Clinical Impression(s) / ED Diagnoses Final diagnoses:  Skin tear of left elbow without complication, initial encounter    Rx / DC Orders ED Discharge Orders  [place]        10/06/20 1315    Face-to-face encounter (required for Medicare/Medicaid patients)       Comments: I Dorie Rank certify that this patient is under my care and that I, or a nurse practitioner or physician's assistant working with me, had a face-to-face encounter that meets the physician face-to-face encounter requirements with this patient on 10/06/2020. The encounter with the patient was in whole, or in part for the following medical  condition(s) which is the primary reason for home health care (List medical condition): laceration to left arm, history of poor wound healing   10/06/20 1315           Dorie Rank, MD 10/06/20 1319

## 2020-10-06 NOTE — Discharge Instructions (Addendum)
Continue to apply antibiotic ointment to the wound as we discussed. Change the dressing twice daily. Follow-up with your doctor in a week or so to make sure the wound is continuing to heal properly. I have placed a home health order. Our case management/social work team may contact you. If you do not hear from home health within a few days contact your primary care doctor for further assistance

## 2020-10-06 NOTE — ED Notes (Signed)
Review D/C papers with pt, pt states understanding, pt denies questions at this time.

## 2020-10-06 NOTE — ED Notes (Signed)
Pt ambulated to restroom with walker without difficulty.

## 2020-10-06 NOTE — ED Triage Notes (Signed)
Hit her left arm on the shower door yesterday.  Open wound to left arm noted.

## 2020-10-08 DIAGNOSIS — Z20828 Contact with and (suspected) exposure to other viral communicable diseases: Secondary | ICD-10-CM | POA: Diagnosis not present

## 2020-10-08 DIAGNOSIS — Z1159 Encounter for screening for other viral diseases: Secondary | ICD-10-CM | POA: Diagnosis not present

## 2020-10-09 DIAGNOSIS — M160 Bilateral primary osteoarthritis of hip: Secondary | ICD-10-CM | POA: Diagnosis not present

## 2020-10-09 DIAGNOSIS — Z8673 Personal history of transient ischemic attack (TIA), and cerebral infarction without residual deficits: Secondary | ICD-10-CM | POA: Diagnosis not present

## 2020-10-09 DIAGNOSIS — Z85828 Personal history of other malignant neoplasm of skin: Secondary | ICD-10-CM | POA: Diagnosis not present

## 2020-10-09 DIAGNOSIS — M3500 Sicca syndrome, unspecified: Secondary | ICD-10-CM | POA: Diagnosis not present

## 2020-10-09 DIAGNOSIS — E785 Hyperlipidemia, unspecified: Secondary | ICD-10-CM | POA: Diagnosis not present

## 2020-10-09 DIAGNOSIS — K5790 Diverticulosis of intestine, part unspecified, without perforation or abscess without bleeding: Secondary | ICD-10-CM | POA: Diagnosis not present

## 2020-10-09 DIAGNOSIS — K8020 Calculus of gallbladder without cholecystitis without obstruction: Secondary | ICD-10-CM | POA: Diagnosis not present

## 2020-10-09 DIAGNOSIS — M7062 Trochanteric bursitis, left hip: Secondary | ICD-10-CM | POA: Diagnosis not present

## 2020-10-09 DIAGNOSIS — K2100 Gastro-esophageal reflux disease with esophagitis, without bleeding: Secondary | ICD-10-CM | POA: Diagnosis not present

## 2020-10-09 DIAGNOSIS — Z7982 Long term (current) use of aspirin: Secondary | ICD-10-CM | POA: Diagnosis not present

## 2020-10-09 DIAGNOSIS — M7061 Trochanteric bursitis, right hip: Secondary | ICD-10-CM | POA: Diagnosis not present

## 2020-10-09 DIAGNOSIS — G8929 Other chronic pain: Secondary | ICD-10-CM | POA: Diagnosis not present

## 2020-10-09 DIAGNOSIS — M5416 Radiculopathy, lumbar region: Secondary | ICD-10-CM | POA: Diagnosis not present

## 2020-10-09 DIAGNOSIS — M479 Spondylosis, unspecified: Secondary | ICD-10-CM | POA: Diagnosis not present

## 2020-10-09 DIAGNOSIS — H9193 Unspecified hearing loss, bilateral: Secondary | ICD-10-CM | POA: Diagnosis not present

## 2020-10-09 DIAGNOSIS — I872 Venous insufficiency (chronic) (peripheral): Secondary | ICD-10-CM | POA: Diagnosis not present

## 2020-10-09 DIAGNOSIS — K311 Adult hypertrophic pyloric stenosis: Secondary | ICD-10-CM | POA: Diagnosis not present

## 2020-10-09 DIAGNOSIS — I10 Essential (primary) hypertension: Secondary | ICD-10-CM | POA: Diagnosis not present

## 2020-10-09 DIAGNOSIS — K589 Irritable bowel syndrome without diarrhea: Secondary | ICD-10-CM | POA: Diagnosis not present

## 2020-10-09 DIAGNOSIS — L570 Actinic keratosis: Secondary | ICD-10-CM | POA: Diagnosis not present

## 2020-10-09 DIAGNOSIS — M797 Fibromyalgia: Secondary | ICD-10-CM | POA: Diagnosis not present

## 2020-10-09 DIAGNOSIS — S51012D Laceration without foreign body of left elbow, subsequent encounter: Secondary | ICD-10-CM | POA: Diagnosis not present

## 2020-10-09 DIAGNOSIS — H3530 Unspecified macular degeneration: Secondary | ICD-10-CM | POA: Diagnosis not present

## 2020-10-09 DIAGNOSIS — J309 Allergic rhinitis, unspecified: Secondary | ICD-10-CM | POA: Diagnosis not present

## 2020-10-09 DIAGNOSIS — F419 Anxiety disorder, unspecified: Secondary | ICD-10-CM | POA: Diagnosis not present

## 2020-10-11 DIAGNOSIS — I10 Essential (primary) hypertension: Secondary | ICD-10-CM | POA: Diagnosis not present

## 2020-10-12 ENCOUNTER — Telehealth: Payer: Self-pay | Admitting: Family Medicine

## 2020-10-12 DIAGNOSIS — Z20828 Contact with and (suspected) exposure to other viral communicable diseases: Secondary | ICD-10-CM | POA: Diagnosis not present

## 2020-10-12 DIAGNOSIS — Z1159 Encounter for screening for other viral diseases: Secondary | ICD-10-CM | POA: Diagnosis not present

## 2020-10-12 NOTE — Telephone Encounter (Signed)
Pt called and is struggling with hip pain. The bilateral injections we gave at last visit only helped about 1 day. She is also now experiencing a spasm in her R leg that travels down her leg.  We ordered an epidural, but [facility] is requiring a CT or MRI. Pt is unable to lay down ( has not laid on her back in years ) and just does not think she can get through either of these tests in order to get the epidural.  Do we have any suggestions? Any imaging she could tolerate without laying down?

## 2020-10-13 DIAGNOSIS — Z23 Encounter for immunization: Secondary | ICD-10-CM | POA: Diagnosis not present

## 2020-10-13 NOTE — Progress Notes (Signed)
Virtual Visit via Video Note  I connected with Susan [name] on 10/13/20 at  4:15 PM EST by a video enabled telemedicine application and verified that I am speaking with the correct person using two identifiers.  Patient is alone on the phone call unable to do virtual  Location: Patient: In home setting Provider: In office setting   I discussed the limitations of evaluation and management by telemedicine and the availability of in person appointments. The patient expressed understanding and agreed to proceed.  History of Present Illness: Patient is a very pleasant 86 year old female who I have seen from multiple times for spinal stenosis, bilateral greater trochanteric bursitis as well as hip arthritis.  Patient is having more pain on the sides of the hip.  Patient was given injection 3 weeks ago.  Starting to have increasing discomfort and pain again already.  Usually last much longer.  States that now she is also having spasms in the groin pain on the right hip.  Patient states it can happen with rest.  Patient states that sometimes it can radiate past her knee as well.  We discussed the potential for an MRI but patient states that she was unable to sit still and has not been able to lay on her back for quite a long time.  We also discussed CT scan but unfortunately myelogram would be necessary and the same problem would be better.  Patient is just looking for some relief.    Observations/Objective: Alert, very pleasant on the phone, at the moment sitting comfortably she states.   Assessment and Plan: 86 year old female with lumbar spinal stenosis, greater trochanteric bursitis, and hip arthritis contributing to continue to have pain.  Lumbar radiculopathy Very difficult to tell.  Patient is continuing to have trouble with the hips.  Patient is now having more groin pain and it could be secondary to more the arthritic changes of the hip.  I would like to see patient in office but she did not  have transportation today or tomorrow.  At this point she is looking for some type of relief.  At this point we will give her some prednisone 20 mg tabs to take daily for the next 5 days.  We discussed the MRI again but patient does not think she would be able to sit still for long amount of time.  May need to consider the possibility of a very small dose of a muscle relaxer such as Zanaflex 2 mg if we continue to have trouble for nighttime relief I am concerned because patient is a fall risk.  Patient will go to the emergency room if any worsening pain over the weekend otherwise patient will follow up with me again with a message on Monday and will discuss if needing to see her in the office again.     Follow Up Instructions: As stated above    I discussed the assessment and treatment plan with the patient. The patient was provided an opportunity to ask questions and all were answered. The patient agreed with the plan and demonstrated an understanding of the instructions.   The patient was advised to call back or seek an in-person evaluation if the symptoms worsen or if the condition fails to improve as anticipated.  I provided 23 minutes during this encounter.   Lyndal Pulley, DO

## 2020-10-13 NOTE — Telephone Encounter (Signed)
Difficult situation.  I would recommend the CT scan of the lumbar, likely only need to lay down for a couple minutes. It would give [place] more info. Myelogram would be better but once again due to pain I would just do the CT scan.

## 2020-10-13 NOTE — Telephone Encounter (Signed)
So hard to figure out.l  I would consider MRI then.  If she is concern of laying down other idea is we could do it in the hospital and use anesthesia

## 2020-10-13 NOTE — Telephone Encounter (Signed)
Spoke with patient. She received a call from [place]. The CT scan is around 40 minutes of lying on her back and the MRI is even longer. Patient wants to know if it is worth going through with these images as she feels her hip pain is worse than her back pain at this time. Please advise on next steps for her.

## 2020-10-14 ENCOUNTER — Ambulatory Visit (INDEPENDENT_AMBULATORY_CARE_PROVIDER_SITE_OTHER): Payer: Medicare Other | Admitting: Family Medicine

## 2020-10-14 ENCOUNTER — Ambulatory Visit (INDEPENDENT_AMBULATORY_CARE_PROVIDER_SITE_OTHER): Payer: Medicare Other | Admitting: Internal Medicine

## 2020-10-14 ENCOUNTER — Encounter: Payer: Self-pay | Admitting: Family Medicine

## 2020-10-14 ENCOUNTER — Other Ambulatory Visit: Payer: Self-pay

## 2020-10-14 ENCOUNTER — Encounter: Payer: Self-pay | Admitting: Internal Medicine

## 2020-10-14 VITALS — BP 124/70 | HR 84 | Ht 61.0 in | Wt 107.0 lb

## 2020-10-14 DIAGNOSIS — R159 Full incontinence of feces: Secondary | ICD-10-CM

## 2020-10-14 DIAGNOSIS — M1610 Unilateral primary osteoarthritis, unspecified hip: Secondary | ICD-10-CM | POA: Diagnosis not present

## 2020-10-14 DIAGNOSIS — R197 Diarrhea, unspecified: Secondary | ICD-10-CM

## 2020-10-14 DIAGNOSIS — M7061 Trochanteric bursitis, right hip: Secondary | ICD-10-CM | POA: Diagnosis not present

## 2020-10-14 DIAGNOSIS — M7062 Trochanteric bursitis, left hip: Secondary | ICD-10-CM | POA: Diagnosis not present

## 2020-10-14 DIAGNOSIS — M5416 Radiculopathy, lumbar region: Secondary | ICD-10-CM | POA: Diagnosis not present

## 2020-10-14 MED ORDER — CHOLESTYRAMINE 4 G PO PACK
4.0000 g | PACK | Freq: Two times a day (BID) | ORAL | 6 refills | Status: DC
Start: 2020-10-14 — End: 2021-12-16

## 2020-10-14 MED ORDER — DIPHENOXYLATE-ATROPINE 2.5-0.025 MG PO TABS: 1.0000 | ORAL_TABLET | Freq: Two times a day (BID) | ORAL | 6 refills | Status: DC | PRN

## 2020-10-14 MED ORDER — PREDNISONE 20 MG PO TABS: 20.0000 mg | ORAL_TABLET | Freq: Every day | ORAL | 0 refills | Status: DC

## 2020-10-14 NOTE — Assessment & Plan Note (Signed)
Very difficult to tell.  Patient is continuing to have trouble with the hips.  Patient is now having more groin pain and it could be secondary to more the arthritic changes of the hip.  I would like to see patient in office but she did not have transportation today or tomorrow.  At this point she is looking for some type of relief.  At this point we will give her some prednisone 20 mg tabs to take daily for the next 5 days.  We discussed the MRI again but patient does not think she would be able to sit still for long amount of time.  May need to consider the possibility of a very small dose of a muscle relaxer such as Zanaflex 2 mg if we continue to have trouble for nighttime relief I am concerned because patient is a fall risk.  Patient will go to the emergency room if any worsening pain over the weekend otherwise patient will follow up with me again with a message on Monday and will discuss if needing to see her in the office again.

## 2020-10-14 NOTE — Patient Instructions (Signed)
We have sent the following medications to your pharmacy for you to pick up at your convenience:  Questran, Lomotil  Continue protective undergarments.  Please follow up on ____________________________

## 2020-10-14 NOTE — Progress Notes (Signed)
HISTORY OF PRESENT ILLNESS:  Susan [name] is a 86 y.o. female with past medical history as listed below.  She carries a diagnosis of diarrhea predominant irritable bowel syndrome.  She presents today regarding diarrhea and incontinence.  She was last seen in this office by the GI physician assistant 2020.  For her diarrhea she has been placed on Questran 4 g daily.  This helps some.  She does use Bentyl for abdominal cramping.  She did state that Lomotil was helpful though her PCP was not certain if this was a good idea long-term.  The patient tolerated it well.  Her bowel habits can vary from loose to soft.  She gets some morning but little time to make it to the restroom.  This has inhibited her social life.  She does not have a bowel movement every day.  Sometimes spontaneous diarrhea without warning.  Problems are not exacerbated by meals.  No nocturnal component at this point.  She lives at independent facility.  She does use protective undergarments.  Completed her Covid vaccination series  REVIEW OF SYSTEMS:  All non-GI ROS negative unless otherwise stated in the HPI except for anxiety, arthritis, back pain, fatigue, hearing problems, muscle cramps, shortness of breath  Past Medical History:  Diagnosis Date  . Actinic keratosis, hx of    chest wall (2012)  . Allergic rhinitis   . Anxiety   . Arthritis    "spine, hips" (01/30/2017)  . Bursitis   . Cellulitis of right foot 01/30/2017  . Colon polyps   . Diverticulosis   . Esophageal dysmotilities 10/16/2017  . Fibromyalgia    "qwhere" (01/30/2017)  . Gallstones   . GERD (gastroesophageal reflux disease)   . High cholesterol   . History of kidney stones   . History of stomach ulcers   . Hx of squamous cell carcinoma excision    right mandible (2011), right arm (2014)  . Hypertension   . IBS (irritable bowel syndrome)   . Osteoarthritis   . Pneumonia 2000s X 2   "twice" (01/30/2017)  . Pyloric stenosis   . Stroke [facility]) 10/2014    denies residual on 01/30/2017    Past Surgical History:  Procedure Laterality Date  . APPENDECTOMY    . CARDIOVASCULAR STRESS TEST  04/2016   nuclear cardiolite stress test done in [place] (no ischemia, no LVH, no LV systolic function)  . CATARACT EXTRACTION W/ INTRAOCULAR LENS  IMPLANT, BILATERAL Bilateral   . CHOLECYSTECTOMY OPEN    . COLONOSCOPY  2009, 2014  . DIAGNOSTIC MAMMOGRAM  2009, 2012  . TONSILLECTOMY      Social History Susan [name]  reports that she has never smoked. She has never used smokeless tobacco. She reports current alcohol use. She reports that she does not use drugs.  family history includes Heart attack in her father, maternal grandfather, maternal grandmother, mother, paternal grandfather, and paternal grandmother; Stroke in her father, maternal grandfather, maternal grandmother, mother, paternal grandfather, and paternal grandmother.  Allergies  Allergen Reactions  . Butrans [Buprenorphine] Other (See Comments)    Severe chest pains  . Codeine Other (See Comments)    Severe Chest pains  . Morphine And Related Other (See Comments)    Severe chest pains       PHYSICAL EXAMINATION: Vital signs: BP 124/70 (BP Location: Left Arm, Patient Position: Sitting, Cuff Size: Normal)   Pulse 84   Ht 5\' 1"  (1.549 m)   Wt 107 lb (48.5 kg)   BMI 20.22  kg/m   Constitutional: Pleasant particularly well-appearing for her age, no acute distress Psychiatric: alert and oriented x3, cooperative Eyes: extraocular movements intact, anicteric, conjunctiva pink Mouth: oral pharynx moist, no lesions Neck: supple no lymphadenopathy Cardiovascular: heart regular rate and rhythm, no murmur Lungs: clear to auscultation bilaterally Abdomen: soft, nontender, nondistended, no obvious ascites, no peritoneal signs, normal bowel sounds, no organomegaly Rectal: Small external hemorrhoid tags.  No mass or tenderness.  Soft brown stool.  NO rectal tone Extremities: no clubbing or  cyanosis.  Trace lower extremity edema bilaterally Skin: no lesions on visible extremities Neuro: No focal deficits.  Cranial nerves intact  ASSESSMENT:  1.  Chronic loose bowels. 2.  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  Increase Questran 4 g twice daily (we did talk about Colestid, though she has difficulty with large pills). 2.  Prescribe Lomotil.  1 p.o. twice daily as needed.  Medication risks reviewed 3.  Continue with protective undergarments 4.  Office follow-up 2 months

## 2020-10-15 DIAGNOSIS — I10 Essential (primary) hypertension: Secondary | ICD-10-CM | POA: Diagnosis not present

## 2020-10-15 DIAGNOSIS — M479 Spondylosis, unspecified: Secondary | ICD-10-CM | POA: Diagnosis not present

## 2020-10-15 DIAGNOSIS — Z1159 Encounter for screening for other viral diseases: Secondary | ICD-10-CM | POA: Diagnosis not present

## 2020-10-15 DIAGNOSIS — M160 Bilateral primary osteoarthritis of hip: Secondary | ICD-10-CM | POA: Diagnosis not present

## 2020-10-15 DIAGNOSIS — Z20828 Contact with and (suspected) exposure to other viral communicable diseases: Secondary | ICD-10-CM | POA: Diagnosis not present

## 2020-10-15 DIAGNOSIS — L570 Actinic keratosis: Secondary | ICD-10-CM | POA: Diagnosis not present

## 2020-10-15 DIAGNOSIS — S51012D Laceration without foreign body of left elbow, subsequent encounter: Secondary | ICD-10-CM | POA: Diagnosis not present

## 2020-10-15 DIAGNOSIS — G8929 Other chronic pain: Secondary | ICD-10-CM | POA: Diagnosis not present

## 2020-10-18 ENCOUNTER — Ambulatory Visit: Payer: Medicare Other | Admitting: Neurology

## 2020-10-18 ENCOUNTER — Telehealth: Payer: Self-pay | Admitting: Family Medicine

## 2020-10-18 NOTE — Telephone Encounter (Signed)
Pt calling with a pain update ( she is having trouble with her MyChart ).  Has taken 4 of the Prednisone and is using Salonpas. Has less hip pain and only 1 spasm yesterday.

## 2020-10-19 DIAGNOSIS — M160 Bilateral primary osteoarthritis of hip: Secondary | ICD-10-CM | POA: Diagnosis not present

## 2020-10-19 DIAGNOSIS — G8929 Other chronic pain: Secondary | ICD-10-CM | POA: Diagnosis not present

## 2020-10-19 DIAGNOSIS — S51012D Laceration without foreign body of left elbow, subsequent encounter: Secondary | ICD-10-CM | POA: Diagnosis not present

## 2020-10-19 DIAGNOSIS — Z20828 Contact with and (suspected) exposure to other viral communicable diseases: Secondary | ICD-10-CM | POA: Diagnosis not present

## 2020-10-19 DIAGNOSIS — Z85828 Personal history of other malignant neoplasm of skin: Secondary | ICD-10-CM | POA: Diagnosis not present

## 2020-10-19 DIAGNOSIS — M479 Spondylosis, unspecified: Secondary | ICD-10-CM | POA: Diagnosis not present

## 2020-10-19 DIAGNOSIS — Z1159 Encounter for screening for other viral diseases: Secondary | ICD-10-CM | POA: Diagnosis not present

## 2020-10-19 DIAGNOSIS — D485 Neoplasm of uncertain behavior of skin: Secondary | ICD-10-CM | POA: Diagnosis not present

## 2020-10-19 DIAGNOSIS — L570 Actinic keratosis: Secondary | ICD-10-CM | POA: Diagnosis not present

## 2020-10-19 DIAGNOSIS — I10 Essential (primary) hypertension: Secondary | ICD-10-CM | POA: Diagnosis not present

## 2020-10-22 DIAGNOSIS — G8929 Other chronic pain: Secondary | ICD-10-CM | POA: Diagnosis not present

## 2020-10-22 DIAGNOSIS — M160 Bilateral primary osteoarthritis of hip: Secondary | ICD-10-CM | POA: Diagnosis not present

## 2020-10-22 DIAGNOSIS — Z1159 Encounter for screening for other viral diseases: Secondary | ICD-10-CM | POA: Diagnosis not present

## 2020-10-22 DIAGNOSIS — M479 Spondylosis, unspecified: Secondary | ICD-10-CM | POA: Diagnosis not present

## 2020-10-22 DIAGNOSIS — L570 Actinic keratosis: Secondary | ICD-10-CM | POA: Diagnosis not present

## 2020-10-22 DIAGNOSIS — S51012D Laceration without foreign body of left elbow, subsequent encounter: Secondary | ICD-10-CM | POA: Diagnosis not present

## 2020-10-22 DIAGNOSIS — I10 Essential (primary) hypertension: Secondary | ICD-10-CM | POA: Diagnosis not present

## 2020-10-22 DIAGNOSIS — Z20828 Contact with and (suspected) exposure to other viral communicable diseases: Secondary | ICD-10-CM | POA: Diagnosis not present

## 2020-10-26 DIAGNOSIS — G8929 Other chronic pain: Secondary | ICD-10-CM | POA: Diagnosis not present

## 2020-10-26 DIAGNOSIS — S51012D Laceration without foreign body of left elbow, subsequent encounter: Secondary | ICD-10-CM | POA: Diagnosis not present

## 2020-10-26 DIAGNOSIS — I10 Essential (primary) hypertension: Secondary | ICD-10-CM | POA: Diagnosis not present

## 2020-10-26 DIAGNOSIS — L570 Actinic keratosis: Secondary | ICD-10-CM | POA: Diagnosis not present

## 2020-10-26 DIAGNOSIS — Z20828 Contact with and (suspected) exposure to other viral communicable diseases: Secondary | ICD-10-CM | POA: Diagnosis not present

## 2020-10-26 DIAGNOSIS — M479 Spondylosis, unspecified: Secondary | ICD-10-CM | POA: Diagnosis not present

## 2020-10-26 DIAGNOSIS — M160 Bilateral primary osteoarthritis of hip: Secondary | ICD-10-CM | POA: Diagnosis not present

## 2020-10-26 DIAGNOSIS — Z1159 Encounter for screening for other viral diseases: Secondary | ICD-10-CM | POA: Diagnosis not present

## 2020-10-27 ENCOUNTER — Telehealth: Payer: Self-pay | Admitting: Nurse Practitioner

## 2020-10-27 DIAGNOSIS — L570 Actinic keratosis: Secondary | ICD-10-CM | POA: Diagnosis not present

## 2020-10-27 NOTE — Telephone Encounter (Signed)
Left message for patient to schedule Annual Wellness Visit.  Please schedule with Nurse Health Advisor Martha Stanley, RN at [facility]  °

## 2020-10-29 DIAGNOSIS — L570 Actinic keratosis: Secondary | ICD-10-CM | POA: Diagnosis not present

## 2020-10-29 DIAGNOSIS — S51012D Laceration without foreign body of left elbow, subsequent encounter: Secondary | ICD-10-CM | POA: Diagnosis not present

## 2020-10-29 DIAGNOSIS — M479 Spondylosis, unspecified: Secondary | ICD-10-CM | POA: Diagnosis not present

## 2020-10-29 DIAGNOSIS — I10 Essential (primary) hypertension: Secondary | ICD-10-CM | POA: Diagnosis not present

## 2020-10-29 DIAGNOSIS — M160 Bilateral primary osteoarthritis of hip: Secondary | ICD-10-CM | POA: Diagnosis not present

## 2020-10-29 DIAGNOSIS — G8929 Other chronic pain: Secondary | ICD-10-CM | POA: Diagnosis not present

## 2020-11-01 ENCOUNTER — Other Ambulatory Visit: Payer: Self-pay | Admitting: Nurse Practitioner

## 2020-11-01 DIAGNOSIS — I10 Essential (primary) hypertension: Secondary | ICD-10-CM

## 2020-11-01 DIAGNOSIS — F418 Other specified anxiety disorders: Secondary | ICD-10-CM

## 2020-11-02 DIAGNOSIS — Z20828 Contact with and (suspected) exposure to other viral communicable diseases: Secondary | ICD-10-CM | POA: Diagnosis not present

## 2020-11-02 DIAGNOSIS — Z1159 Encounter for screening for other viral diseases: Secondary | ICD-10-CM | POA: Diagnosis not present

## 2020-11-03 DIAGNOSIS — L570 Actinic keratosis: Secondary | ICD-10-CM | POA: Diagnosis not present

## 2020-11-03 DIAGNOSIS — M479 Spondylosis, unspecified: Secondary | ICD-10-CM | POA: Diagnosis not present

## 2020-11-03 DIAGNOSIS — S51012D Laceration without foreign body of left elbow, subsequent encounter: Secondary | ICD-10-CM | POA: Diagnosis not present

## 2020-11-03 DIAGNOSIS — M160 Bilateral primary osteoarthritis of hip: Secondary | ICD-10-CM | POA: Diagnosis not present

## 2020-11-03 DIAGNOSIS — G8929 Other chronic pain: Secondary | ICD-10-CM | POA: Diagnosis not present

## 2020-11-03 DIAGNOSIS — I10 Essential (primary) hypertension: Secondary | ICD-10-CM | POA: Diagnosis not present

## 2020-11-04 DIAGNOSIS — M160 Bilateral primary osteoarthritis of hip: Secondary | ICD-10-CM | POA: Diagnosis not present

## 2020-11-04 DIAGNOSIS — I10 Essential (primary) hypertension: Secondary | ICD-10-CM | POA: Diagnosis not present

## 2020-11-04 DIAGNOSIS — L570 Actinic keratosis: Secondary | ICD-10-CM | POA: Diagnosis not present

## 2020-11-04 DIAGNOSIS — S51012D Laceration without foreign body of left elbow, subsequent encounter: Secondary | ICD-10-CM | POA: Diagnosis not present

## 2020-11-04 DIAGNOSIS — G8929 Other chronic pain: Secondary | ICD-10-CM | POA: Diagnosis not present

## 2020-11-04 DIAGNOSIS — M479 Spondylosis, unspecified: Secondary | ICD-10-CM | POA: Diagnosis not present

## 2020-11-05 DIAGNOSIS — Z1159 Encounter for screening for other viral diseases: Secondary | ICD-10-CM | POA: Diagnosis not present

## 2020-11-05 DIAGNOSIS — Z20828 Contact with and (suspected) exposure to other viral communicable diseases: Secondary | ICD-10-CM | POA: Diagnosis not present

## 2020-11-08 DIAGNOSIS — L570 Actinic keratosis: Secondary | ICD-10-CM | POA: Diagnosis not present

## 2020-11-08 DIAGNOSIS — K5790 Diverticulosis of intestine, part unspecified, without perforation or abscess without bleeding: Secondary | ICD-10-CM | POA: Diagnosis not present

## 2020-11-08 DIAGNOSIS — M3500 Sicca syndrome, unspecified: Secondary | ICD-10-CM | POA: Diagnosis not present

## 2020-11-08 DIAGNOSIS — E785 Hyperlipidemia, unspecified: Secondary | ICD-10-CM | POA: Diagnosis not present

## 2020-11-08 DIAGNOSIS — M7062 Trochanteric bursitis, left hip: Secondary | ICD-10-CM | POA: Diagnosis not present

## 2020-11-08 DIAGNOSIS — H3530 Unspecified macular degeneration: Secondary | ICD-10-CM | POA: Diagnosis not present

## 2020-11-08 DIAGNOSIS — K8020 Calculus of gallbladder without cholecystitis without obstruction: Secondary | ICD-10-CM | POA: Diagnosis not present

## 2020-11-08 DIAGNOSIS — Z85828 Personal history of other malignant neoplasm of skin: Secondary | ICD-10-CM | POA: Diagnosis not present

## 2020-11-08 DIAGNOSIS — M7061 Trochanteric bursitis, right hip: Secondary | ICD-10-CM | POA: Diagnosis not present

## 2020-11-08 DIAGNOSIS — Z8673 Personal history of transient ischemic attack (TIA), and cerebral infarction without residual deficits: Secondary | ICD-10-CM | POA: Diagnosis not present

## 2020-11-08 DIAGNOSIS — M160 Bilateral primary osteoarthritis of hip: Secondary | ICD-10-CM | POA: Diagnosis not present

## 2020-11-08 DIAGNOSIS — K2100 Gastro-esophageal reflux disease with esophagitis, without bleeding: Secondary | ICD-10-CM | POA: Diagnosis not present

## 2020-11-08 DIAGNOSIS — K311 Adult hypertrophic pyloric stenosis: Secondary | ICD-10-CM | POA: Diagnosis not present

## 2020-11-08 DIAGNOSIS — F419 Anxiety disorder, unspecified: Secondary | ICD-10-CM | POA: Diagnosis not present

## 2020-11-08 DIAGNOSIS — H9193 Unspecified hearing loss, bilateral: Secondary | ICD-10-CM | POA: Diagnosis not present

## 2020-11-08 DIAGNOSIS — K589 Irritable bowel syndrome without diarrhea: Secondary | ICD-10-CM | POA: Diagnosis not present

## 2020-11-08 DIAGNOSIS — Z7982 Long term (current) use of aspirin: Secondary | ICD-10-CM | POA: Diagnosis not present

## 2020-11-08 DIAGNOSIS — M479 Spondylosis, unspecified: Secondary | ICD-10-CM | POA: Diagnosis not present

## 2020-11-08 DIAGNOSIS — S51012D Laceration without foreign body of left elbow, subsequent encounter: Secondary | ICD-10-CM | POA: Diagnosis not present

## 2020-11-08 DIAGNOSIS — J309 Allergic rhinitis, unspecified: Secondary | ICD-10-CM | POA: Diagnosis not present

## 2020-11-08 DIAGNOSIS — M797 Fibromyalgia: Secondary | ICD-10-CM | POA: Diagnosis not present

## 2020-11-08 DIAGNOSIS — I872 Venous insufficiency (chronic) (peripheral): Secondary | ICD-10-CM | POA: Diagnosis not present

## 2020-11-08 DIAGNOSIS — M5416 Radiculopathy, lumbar region: Secondary | ICD-10-CM | POA: Diagnosis not present

## 2020-11-08 DIAGNOSIS — I10 Essential (primary) hypertension: Secondary | ICD-10-CM | POA: Diagnosis not present

## 2020-11-08 DIAGNOSIS — G8929 Other chronic pain: Secondary | ICD-10-CM | POA: Diagnosis not present

## 2020-11-09 DIAGNOSIS — G8929 Other chronic pain: Secondary | ICD-10-CM | POA: Diagnosis not present

## 2020-11-09 DIAGNOSIS — I10 Essential (primary) hypertension: Secondary | ICD-10-CM | POA: Diagnosis not present

## 2020-11-09 DIAGNOSIS — L570 Actinic keratosis: Secondary | ICD-10-CM | POA: Diagnosis not present

## 2020-11-09 DIAGNOSIS — Z1159 Encounter for screening for other viral diseases: Secondary | ICD-10-CM | POA: Diagnosis not present

## 2020-11-09 DIAGNOSIS — M479 Spondylosis, unspecified: Secondary | ICD-10-CM | POA: Diagnosis not present

## 2020-11-09 DIAGNOSIS — Z20828 Contact with and (suspected) exposure to other viral communicable diseases: Secondary | ICD-10-CM | POA: Diagnosis not present

## 2020-11-09 DIAGNOSIS — M160 Bilateral primary osteoarthritis of hip: Secondary | ICD-10-CM | POA: Diagnosis not present

## 2020-11-09 DIAGNOSIS — S51012D Laceration without foreign body of left elbow, subsequent encounter: Secondary | ICD-10-CM | POA: Diagnosis not present

## 2020-11-10 DIAGNOSIS — I10 Essential (primary) hypertension: Secondary | ICD-10-CM | POA: Diagnosis not present

## 2020-11-12 DIAGNOSIS — Z1159 Encounter for screening for other viral diseases: Secondary | ICD-10-CM | POA: Diagnosis not present

## 2020-11-12 DIAGNOSIS — Z20828 Contact with and (suspected) exposure to other viral communicable diseases: Secondary | ICD-10-CM | POA: Diagnosis not present

## 2020-11-17 ENCOUNTER — Encounter: Payer: Self-pay | Admitting: Family Medicine

## 2020-11-17 ENCOUNTER — Ambulatory Visit (INDEPENDENT_AMBULATORY_CARE_PROVIDER_SITE_OTHER): Payer: Medicare Other | Admitting: Family Medicine

## 2020-11-17 ENCOUNTER — Ambulatory Visit: Payer: Self-pay

## 2020-11-17 ENCOUNTER — Other Ambulatory Visit: Payer: Self-pay

## 2020-11-17 VITALS — BP 134/78 | HR 75 | Ht 61.0 in | Wt 109.0 lb

## 2020-11-17 DIAGNOSIS — M5416 Radiculopathy, lumbar region: Secondary | ICD-10-CM | POA: Diagnosis not present

## 2020-11-17 DIAGNOSIS — M25552 Pain in left hip: Secondary | ICD-10-CM

## 2020-11-17 DIAGNOSIS — M25551 Pain in right hip: Secondary | ICD-10-CM

## 2020-11-17 DIAGNOSIS — G8929 Other chronic pain: Secondary | ICD-10-CM | POA: Diagnosis not present

## 2020-11-17 DIAGNOSIS — M545 Low back pain, unspecified: Secondary | ICD-10-CM

## 2020-11-17 NOTE — Progress Notes (Signed)
[facility] Phone: [phone] Subjective:   Susan [name], [name] This visit occurred during the SARS-CoV-2 public health emergency.  Safety protocols were in place, including screening questions prior to the visit, additional usage of staff PPE, and extensive cleaning of exam room while observing appropriate contact time as indicated for disinfecting solutions.   I'm seeing this patient by the request  of:  Nche, Susan Brooke, NP  CC: Bilateral hip and low back pain.  XKG:[id number]   10/14/2020  86 year old female with lumbar spinal stenosis, greater trochanteric bursitis, and hip arthritis contributing to continue to have pain.  Very difficult to tell.  Patient is continuing to have trouble with the hips.  Patient is now having more groin pain and it could be secondary to more the arthritic changes of the hip.  I would like to see patient in office but she did not have transportation today or tomorrow.  At this point she is looking for some type of relief.  At this point we will give her some prednisone 20 mg tabs to take daily for the next 5 days.  We discussed the MRI again but patient does not think she would be able to sit still for long amount of time.  May need to consider the possibility of a very small dose of a muscle relaxer such as Zanaflex 2 mg if we continue to have trouble for nighttime relief I am concerned because patient is a fall risk.  Patient will go to the emergency room if any worsening pain over the weekend otherwise patient will follow up with me again with a message on Monday and will discuss if needing to see her in the office again.     Update 11/17/2020 Susan [name] is a 86 y.o. female coming in with complaint of hip pain and lower back pain. Last visit was video visit. Patient states that the prednisone did help to alleviate her pain. Pain is starting  to come back.  Patient states that it is more in the back, at this time.  Rates the severity of pain is 8 out of 10.  Patient is having more difficulty with daily activities.  Continues to walk with the aid of a walker.  Wants to know what else can be done.       Past Medical History:  Diagnosis Date  . Actinic keratosis, hx of    chest wall (2012)  . Allergic rhinitis   . Anxiety   . Arthritis    "spine, hips" (01/30/2017)  . Bursitis   . Cellulitis of right foot 01/30/2017  . Colon polyps   . Diverticulosis   . Esophageal dysmotilities 10/16/2017  . Fibromyalgia    "qwhere" (01/30/2017)  . Gallstones   . GERD (gastroesophageal reflux disease)   . High cholesterol   . History of kidney stones   . History of stomach ulcers   . Hx of squamous cell carcinoma excision    right mandible (2011), right arm (2014)  . Hypertension   . IBS (irritable bowel syndrome)   . Osteoarthritis   . Pneumonia 2000s X 2   "twice" (01/30/2017)  . Pyloric stenosis   . Stroke [facility]) 10/2014   denies residual on 01/30/2017   Past Surgical History:  Procedure Laterality Date  . APPENDECTOMY    . CARDIOVASCULAR STRESS TEST  04/2016   nuclear cardiolite stress test done  in [place] ([name] ischemia, [name] LVH, [name] LV systolic function)  . CATARACT EXTRACTION W/ INTRAOCULAR LENS  IMPLANT, BILATERAL Bilateral   . CHOLECYSTECTOMY OPEN    . COLONOSCOPY  2009, 2014  . DIAGNOSTIC MAMMOGRAM  2009, 2012  . TONSILLECTOMY     Social History   Socioeconomic History  . Marital status: Widowed    Spouse name: Not on file  . Number of children: 4  . Years of education: Not on file  . Highest education level: Not on file  Occupational History  . Occupation: retired  Tobacco Use  . Smoking status: Never Smoker  . Smokeless tobacco: Never Used  Vaping Use  . Vaping Use: Never used  Substance and Sexual Activity  . Alcohol use: Yes    Comment: glass of red wine maybe once week  . Drug use: [name]  . Sexual  activity: Not on file  Other Topics Concern  . Not on file  Social History Narrative  . Not on file   Social Determinants of Health   Financial Resource Strain: Not on file  Food Insecurity: Not on file  Transportation Needs: Not on file  Physical Activity: Not on file  Stress: Not on file  Social Connections: Not on file   Allergies  Allergen Reactions  . Butrans [Buprenorphine] Other (See Comments)    Severe chest pains  . Codeine Other (See Comments)    Severe Chest pains  . Morphine And Related Other (See Comments)    Severe chest pains   Family History  Problem Relation Age of Onset  . Stroke Mother   . Heart attack Mother   . Stroke Father   . Heart attack Father   . Heart attack Maternal Grandmother   . Stroke Maternal Grandmother   . Stroke Paternal Grandmother   . Heart attack Paternal Grandmother   . Heart attack Maternal Grandfather   . Stroke Maternal Grandfather   . Stroke Paternal Grandfather   . Heart attack Paternal Grandfather   . Colon cancer Neg Hx   . Pancreatic cancer Neg Hx   . Stomach cancer Neg Hx   . Esophageal cancer Neg Hx     Current Outpatient Medications (Endocrine & Metabolic):  .  predniSONE (DELTASONE) 20 MG tablet, Take 1 tablet (20 mg total) by mouth daily with breakfast.  Current Outpatient Medications (Cardiovascular):  .  amLODipine (NORVASC) 5 MG tablet, Take 2 tablets (10 mg total) by mouth at bedtime. .  cholestyramine (QUESTRAN) 4 g packet, Take 1 packet (4 g total) by mouth 2 (two) times daily. .  furosemide (LASIX) 20 MG tablet, Take 1 tablet (20 mg total) by mouth daily. Marland Kitchen  losartan (COZAAR) 100 MG tablet, Take 1 tablet (100 mg total) by mouth daily. .  metoprolol succinate (TOPROL-XL) 25 MG 24 hr tablet, Take 1 tablet by mouth once daily  Current Outpatient Medications (Respiratory):  .  fexofenadine (ALLEGRA) 180 MG tablet, Take 180 mg by mouth daily.  Current Outpatient Medications (Analgesics):  .   acetaminophen (TYLENOL) 650 MG CR tablet, Take 650 mg by mouth every 8 (eight) hours as needed for pain. Marland Kitchen  aspirin EC 325 MG tablet, Take 325 mg by mouth daily.  Current Outpatient Medications (Hematological):  Marland Kitchen  IRON PO, Take 65 mg by mouth 3 (three) times a week.  Current Outpatient Medications (Other):  Marland Kitchen  ALPRAZolam (XANAX) 0.5 MG tablet, TAKE 1 TABLET BY MOUTH ONCE DAILY AS NEEDED FOR ANXIETY .  dicyclomine (BENTYL) 10  MG capsule, Take 1 capsule (10 mg total) by mouth 2 (two) times daily. .  diphenoxylate-atropine (LOMOTIL) 2.5-0.025 MG tablet, Take 1 tablet by mouth daily as needed for diarrhea or loose stools (>4large watery stools per day). Marland Kitchen  diphenoxylate-atropine (LOMOTIL) 2.5-0.025 MG tablet, Take 1 tablet by mouth 2 (two) times daily as needed for diarrhea or loose stools. .  famotidine (PEPCID) 20 MG tablet, Take 1 tablet by mouth once daily .  meclizine (ANTIVERT) 25 MG tablet, Take 1 tablet (25 mg total) by mouth 3 (three) times daily as needed for dizziness. .  potassium chloride SA (KLOR-CON) 20 MEQ tablet, Take 1 tablet (20 mEq total) by mouth daily. .  Vitamin D, Ergocalciferol, (DRISDOL) 1.25 MG (50000 UNIT) CAPS capsule, Take 1 capsule by mouth once a week   Reviewed prior external information including notes and imaging from  primary care provider As well as notes that were available from care everywhere and other healthcare systems.  Past medical history, social, surgical and family history all reviewed in electronic medical record.  [name] pertanent information unless stated regarding to the chief complaint.   Review of Systems:  [name] headache, visual changes, nausea, vomiting, diarrhea, constipation, dizziness, abdominal pain, skin rash, fevers, chills, night sweats, weight loss, swollen lymph nodes,, joint swelling, chest pain, shortness of breath, mood changes. POSITIVE muscle aches, body aches  Objective  Blood pressure 134/78, pulse 75, height 5\' 1"  (1.549 m),  weight 109 lb (49.4 kg), SpO2 97 %.   General: [name] apparent distress alert and oriented x3 mood and affect normal, dressed appropriately.  HEENT: Pupils equal, extraocular movements intact  Respiratory: Patient's speak in full sentences and does not appear short of breath  Cardiovascular: [name] lower extremity edema, non tender, [name] erythema  Significant arthritic changes of multiple joints. Low back exam does have loss of lordosis with some degenerative scoliosis noted.  Tender to palpation in the paraspinal musculature.  Patient is neurovascularly intact distally.  Does have pain with any type of range of motion of the hips.  Patient does have some atrophy noted of the musculature of the legs.    Impression and Recommendations:     The above documentation has been reviewed and is accurate and complete Lyndal Pulley, DO

## 2020-11-17 NOTE — Assessment & Plan Note (Signed)
Patient is having significant lumbar radiculopathy.  We discussed with patient in great length.  Patient's currently having tingling pain in the spasms secondary to the back will get an MRI.  Patient does have Xanax and will take 1-1/2 times before for test.  She usually takes this at night.  Patient will have the MRI and then depending on findings patient could be a candidate for an epidural.

## 2020-11-17 NOTE — Patient Instructions (Addendum)
1 zanax 30 mins before test Pennsaid before the test fingertip amount Ice is still your friend Depending on MRI we will consider epidural  Call [facility] [phone] to schedule MRI We will see you 4 weeks after epidural if we decide to do epidural

## 2020-11-19 DIAGNOSIS — Z1159 Encounter for screening for other viral diseases: Secondary | ICD-10-CM | POA: Diagnosis not present

## 2020-11-19 DIAGNOSIS — Z20828 Contact with and (suspected) exposure to other viral communicable diseases: Secondary | ICD-10-CM | POA: Diagnosis not present

## 2020-11-23 ENCOUNTER — Telehealth: Payer: Self-pay | Admitting: Nurse Practitioner

## 2020-11-23 DIAGNOSIS — Z1159 Encounter for screening for other viral diseases: Secondary | ICD-10-CM | POA: Diagnosis not present

## 2020-11-23 DIAGNOSIS — Z20828 Contact with and (suspected) exposure to other viral communicable diseases: Secondary | ICD-10-CM | POA: Diagnosis not present

## 2020-11-23 NOTE — Telephone Encounter (Signed)
Pt called and said that both feet and legs are swelling and she said its concerning and that she wants to see Baldo Ash but the only thing she has available for next week is same day appts, I tried to get her in possibly today or this week with different provider but she only wanted to see Baldo Ash, she is going to call back next week for same day appt. I did let her know to call us if she needs [place] sooner or if she thinks its getting to bad to go to urgent care or ED and she verbalized understanding.

## 2020-11-30 ENCOUNTER — Ambulatory Visit: Payer: Medicare Other | Admitting: Nurse Practitioner

## 2020-11-30 DIAGNOSIS — Z1159 Encounter for screening for other viral diseases: Secondary | ICD-10-CM | POA: Diagnosis not present

## 2020-11-30 DIAGNOSIS — Z20828 Contact with and (suspected) exposure to other viral communicable diseases: Secondary | ICD-10-CM | POA: Diagnosis not present

## 2020-12-01 ENCOUNTER — Other Ambulatory Visit: Payer: Self-pay | Admitting: Nurse Practitioner

## 2020-12-01 DIAGNOSIS — K2100 Gastro-esophageal reflux disease with esophagitis, without bleeding: Secondary | ICD-10-CM

## 2020-12-03 DIAGNOSIS — Z20828 Contact with and (suspected) exposure to other viral communicable diseases: Secondary | ICD-10-CM | POA: Diagnosis not present

## 2020-12-03 DIAGNOSIS — Z1159 Encounter for screening for other viral diseases: Secondary | ICD-10-CM | POA: Diagnosis not present

## 2020-12-07 DIAGNOSIS — Z1159 Encounter for screening for other viral diseases: Secondary | ICD-10-CM | POA: Diagnosis not present

## 2020-12-07 DIAGNOSIS — Z20828 Contact with and (suspected) exposure to other viral communicable diseases: Secondary | ICD-10-CM | POA: Diagnosis not present

## 2020-12-08 ENCOUNTER — Telehealth: Payer: Self-pay | Admitting: Nurse Practitioner

## 2020-12-08 NOTE — Telephone Encounter (Signed)
ok 

## 2020-12-08 NOTE — Telephone Encounter (Signed)
Patient states that her pharmacy is out of Losartan 100mg , but they have the 50mg . They need a new prescription for 50mg  twice a day. She said if this gets sent in today she has someone who can pick it up for her. Please call her at [phone] if you have any questions.

## 2020-12-10 DIAGNOSIS — Z1159 Encounter for screening for other viral diseases: Secondary | ICD-10-CM | POA: Diagnosis not present

## 2020-12-10 DIAGNOSIS — Z20828 Contact with and (suspected) exposure to other viral communicable diseases: Secondary | ICD-10-CM | POA: Diagnosis not present

## 2020-12-13 ENCOUNTER — Ambulatory Visit (INDEPENDENT_AMBULATORY_CARE_PROVIDER_SITE_OTHER): Payer: Medicare Other | Admitting: Internal Medicine

## 2020-12-13 ENCOUNTER — Encounter: Payer: Self-pay | Admitting: Internal Medicine

## 2020-12-13 VITALS — BP 124/60 | HR 72 | Ht 61.0 in | Wt 111.4 lb

## 2020-12-13 DIAGNOSIS — R197 Diarrhea, unspecified: Secondary | ICD-10-CM | POA: Diagnosis not present

## 2020-12-13 DIAGNOSIS — R159 Full incontinence of feces: Secondary | ICD-10-CM | POA: Diagnosis not present

## 2020-12-13 NOTE — Patient Instructions (Signed)
Please follow up as needed 

## 2020-12-14 ENCOUNTER — Other Ambulatory Visit: Payer: Self-pay

## 2020-12-15 ENCOUNTER — Ambulatory Visit (INDEPENDENT_AMBULATORY_CARE_PROVIDER_SITE_OTHER): Payer: Medicare Other | Admitting: Nurse Practitioner

## 2020-12-15 ENCOUNTER — Encounter: Payer: Self-pay | Admitting: Nurse Practitioner

## 2020-12-15 VITALS — BP 110/64 | HR 68 | Temp 97.3°F | Ht 61.0 in | Wt 108.6 lb

## 2020-12-15 DIAGNOSIS — I10 Essential (primary) hypertension: Secondary | ICD-10-CM

## 2020-12-15 DIAGNOSIS — I872 Venous insufficiency (chronic) (peripheral): Secondary | ICD-10-CM

## 2020-12-15 MED ORDER — FUROSEMIDE 20 MG PO TABS: 20.0000 mg | ORAL_TABLET | Freq: Every day | ORAL | 1 refills | Status: DC

## 2020-12-15 NOTE — Patient Instructions (Signed)
Go to [address] for blood draw  Increase furosemide to 40mg  daily x4days, then resume 20mg   Ask Alfredo Bach about graduated ace bandage applied by nursing staff?  Call office if no improvement in 3days.

## 2020-12-15 NOTE — Progress Notes (Signed)
Subjective:  Patient ID: Susan [name], female    DOB: 1924/09/06  Age: 87 y.o. MRN: [id number]  CC: Acute Visit (Pt c/o swelling in feet and legs x3 weeks. Swelling stays all day, gets worse but doesn't get better. )  HPI  Venous insufficiency of both lower extremities Chronic, worse in last 93months. Minimal improvement with elevation Denies change in diet or medications.  Repeat BMP Increase furosemide dose:40mg  daily x 4days, then 20mg  continuously She is unable to tolerate compression stocking due to fragile skin. Recommended use of graduated ACE wrap, but she wants to check if [facility] is able to provide servic. If not I will enter referral to vascular clinic.  Wt Readings from Last 3 Encounters:  12/15/20 108 lb 9.6 oz (49.3 kg)  12/13/20 111 lb 6 oz (50.5 kg)  11/17/20 109 lb (49.4 kg)   BP Readings from Last 3 Encounters:  12/15/20 110/64  12/13/20 124/60  11/17/20 134/78   Reviewed past Medical, Social and Family history today.  Outpatient Medications Prior to Visit  Medication Sig Dispense Refill  . acetaminophen (TYLENOL) 650 MG CR tablet Take 650 mg by mouth every 8 (eight) hours as needed for pain.    Marland Kitchen ALPRAZolam (XANAX) 0.5 MG tablet TAKE 1 TABLET BY MOUTH ONCE DAILY AS NEEDED FOR ANXIETY 30 tablet 5  . amLODipine (NORVASC) 5 MG tablet Take 2 tablets (10 mg total) by mouth at bedtime. 90 tablet 3  . aspirin EC 325 MG tablet Take 325 mg by mouth daily.    . cholestyramine (QUESTRAN) 4 g packet Take 1 packet (4 g total) by mouth 2 (two) times daily. 60 each 6  . dicyclomine (BENTYL) 10 MG capsule Take 1 capsule (10 mg total) by mouth 2 (two) times daily. 180 capsule 3  . diphenoxylate-atropine (LOMOTIL) 2.5-0.025 MG tablet Take 1 tablet by mouth 2 (two) times daily as needed for diarrhea or loose stools. 60 tablet 6  . famotidine (PEPCID) 20 MG tablet Take 1 tablet by mouth once daily 90 tablet 0  . fexofenadine (ALLEGRA) 180 MG tablet Take 180 mg by mouth  daily.    . IRON PO Take 65 mg by mouth 3 (three) times a week.    . losartan (COZAAR) 100 MG tablet Take 1 tablet (100 mg total) by mouth daily. 90 tablet 3  . meclizine (ANTIVERT) 25 MG tablet Take 1 tablet (25 mg total) by mouth 3 (three) times daily as needed for dizziness. 30 tablet 0  . metoprolol succinate (TOPROL-XL) 25 MG 24 hr tablet Take 1 tablet by mouth once daily 90 tablet 0  . potassium chloride SA (KLOR-CON) 20 MEQ tablet Take 1 tablet (20 mEq total) by mouth daily. 90 tablet 1  . Vitamin D, Ergocalciferol, (DRISDOL) 1.25 MG (50000 UNIT) CAPS capsule Take 1 capsule by mouth once a week 12 capsule 0  . furosemide (LASIX) 20 MG tablet Take 1 tablet (20 mg total) by mouth daily. 90 tablet 1   No facility-administered medications prior to visit.    ROS See HPI  Objective:  BP 110/64 (BP Location: Right Arm, Patient Position: Sitting, Cuff Size: Normal)   Pulse 68   Temp (!) 97.3 F (36.3 C) (Temporal)   Ht 5\' 1"  (1.549 m)   Wt 108 lb 9.6 oz (49.3 kg)   SpO2 97%   BMI 20.52 kg/m   Physical Exam Cardiovascular:     Rate and Rhythm: Normal rate and regular rhythm.     Pulses:  Normal pulses.     Heart sounds: Normal heart sounds.  Pulmonary:     Effort: Pulmonary effort is normal.     Breath sounds: Normal breath sounds.  Musculoskeletal:        General: No tenderness.     Right lower leg: Edema present.     Left lower leg: Edema present.  Skin:    Findings: No erythema or rash.  Neurological:     Mental Status: She is alert and oriented to person, place, and time.    Assessment & Plan:  This visit occurred during the SARS-CoV-2 public health emergency.  Safety protocols were in place, including screening questions prior to the visit, additional usage of staff PPE, and extensive cleaning of exam room while observing appropriate contact time as indicated for disinfecting solutions.   Aniesa was seen today for acute visit.  Diagnoses and all orders for this  visit:  Venous insufficiency of both lower extremities -     Basic metabolic panel; Future -     furosemide (LASIX) 20 MG tablet; Take 1 tablet (20 mg total) by mouth daily.  Essential hypertension -     furosemide (LASIX) 20 MG tablet; Take 1 tablet (20 mg total) by mouth daily.   Problem List Items Addressed This Visit      Cardiovascular and Mediastinum   Essential hypertension   Relevant Medications   furosemide (LASIX) 20 MG tablet   Venous insufficiency of both lower extremities - Primary    Chronic, worse in last 26months. Minimal improvement with elevation Denies change in diet or medications.  Repeat BMP Increase furosemide dose:40mg  daily x 4days, then 20mg  continuously She is unable to tolerate compression stocking due to fragile skin. Recommended use of graduated ACE wrap, but she wants to check if [facility] is able to provide servic. If not I will enter referral to vascular clinic.      Relevant Medications   furosemide (LASIX) 20 MG tablet   Other Relevant Orders   Basic metabolic panel (Completed)      Follow-up: No follow-ups on file.  Wilfred Lacy, NP

## 2020-12-16 ENCOUNTER — Encounter: Payer: Self-pay | Admitting: Nurse Practitioner

## 2020-12-16 ENCOUNTER — Ambulatory Visit
Admission: RE | Admit: 2020-12-16 | Discharge: 2020-12-16 | Disposition: A | Discharge status: Home / Self Care | Payer: Medicare Other | Source: Ambulatory Visit | Attending: Family Medicine | Admitting: Family Medicine

## 2020-12-16 ENCOUNTER — Encounter: Payer: Self-pay | Admitting: Internal Medicine

## 2020-12-16 ENCOUNTER — Other Ambulatory Visit (INDEPENDENT_AMBULATORY_CARE_PROVIDER_SITE_OTHER): Payer: Medicare Other

## 2020-12-16 ENCOUNTER — Other Ambulatory Visit: Payer: Self-pay

## 2020-12-16 DIAGNOSIS — M545 Low back pain, unspecified: Secondary | ICD-10-CM

## 2020-12-16 DIAGNOSIS — M48061 Spinal stenosis, lumbar region without neurogenic claudication: Secondary | ICD-10-CM | POA: Diagnosis not present

## 2020-12-16 DIAGNOSIS — G8929 Other chronic pain: Secondary | ICD-10-CM

## 2020-12-16 DIAGNOSIS — I872 Venous insufficiency (chronic) (peripheral): Secondary | ICD-10-CM

## 2020-12-16 LAB — BASIC METABOLIC PANEL
BUN: 15 mg/dL (ref 6–23)
CO2: 29 mEq/L (ref 19–32)
Calcium: 9.2 mg/dL (ref 8.4–10.5)
Chloride: 105 mEq/L (ref 96–112)
Creatinine, Ser: 0.57 mg/dL (ref 0.40–1.20)
GFR: 77.99 mL/min (ref >60.00)
Glucose, Bld: 96 mg/dL (ref 70–99)
Potassium: 3.7 mEq/L (ref 3.5–5.1)
Sodium: 140 mEq/L (ref 135–145)

## 2020-12-16 NOTE — Assessment & Plan Note (Signed)
Chronic, worse in last 28months. Minimal improvement with elevation Denies change in diet or medications.  Repeat BMP Increase furosemide dose:40mg  daily x 4days, then 20mg  continuously She is unable to tolerate compression stocking due to fragile skin. Recommended use of graduated ACE wrap, but she wants to check if [facility] is able to provide servic. If not I will enter referral to vascular clinic.

## 2020-12-16 NOTE — Progress Notes (Signed)
HISTORY OF PRESENT ILLNESS:  Susan [name] is a 87 y.o. female who was evaluated October 14, 2020 regarding chronic loose stools and fecal incontinence.  She is noted to have decreased rectal tone.  See that dictation.  She was told to increase Questran to 4 mg twice daily.  We will also prescribe Lomotil.  She has been using protective undergarments.  This appointment made for follow-up.  Patient tells me that when she was taking her therapies as I had recommended, she is doing better.  However, she developed worsening lower extremity edema (she had edema previously) which she attributed to the medications.  Thus, she stopped the medication.  Thus, problems with loose stools and incontinence resume.  No new problems or issues  REVIEW OF SYSTEMS:  All non-GI ROS negative. Past Medical History:  Diagnosis Date  . Actinic keratosis, hx of    chest wall (2012)  . Allergic rhinitis   . Anxiety   . Arthritis    "spine, hips" (01/30/2017)  . Bursitis   . Cellulitis of right foot 01/30/2017  . Colon polyps   . Diverticulosis   . Esophageal dysmotilities 10/16/2017  . Fibromyalgia    "qwhere" (01/30/2017)  . Gallstones   . GERD (gastroesophageal reflux disease)   . High cholesterol   . History of kidney stones   . History of stomach ulcers   . Hx of squamous cell carcinoma excision    right mandible (2011), right arm (2014)  . Hypertension   . IBS (irritable bowel syndrome)   . Osteoarthritis   . Pneumonia 2000s X 2   "twice" (01/30/2017)  . Pyloric stenosis   . Stroke [facility]) 10/2014   denies residual on 01/30/2017    Past Surgical History:  Procedure Laterality Date  . APPENDECTOMY    . CARDIOVASCULAR STRESS TEST  04/2016   nuclear cardiolite stress test done in [place] (no ischemia, no LVH, no LV systolic function)  . CATARACT EXTRACTION W/ INTRAOCULAR LENS  IMPLANT, BILATERAL Bilateral   . CHOLECYSTECTOMY OPEN    . COLONOSCOPY  2009, 2014  . DIAGNOSTIC MAMMOGRAM  2009, 2012  .  TONSILLECTOMY      Social History Susan [name]  reports that she has never smoked. She has never used smokeless tobacco. She reports current alcohol use. She reports that she does not use drugs.  family history includes Heart attack in her father, maternal grandfather, maternal grandmother, mother, paternal grandfather, and paternal grandmother; Stroke in her father, maternal grandfather, maternal grandmother, mother, paternal grandfather, and paternal grandmother.  Allergies  Allergen Reactions  . Butrans [Buprenorphine] Other (See Comments)    Severe chest pains  . Codeine Other (See Comments)    Severe Chest pains  . Morphine And Related Other (See Comments)    Severe chest pains       PHYSICAL EXAMINATION: Vital signs: BP 124/60 (BP Location: Left Arm, Patient Position: Sitting, Cuff Size: Normal)   Pulse 72   Ht 5\' 1"  (1.549 m)   Wt 111 lb 6 oz (50.5 kg)   BMI 21.04 kg/m   Constitutional: generally well-appearing, no acute distress Psychiatric: alert and oriented x3, cooperative Eyes: Icteric Mouth: Mask Abdomen: Not reexamined Extremities: 1+ edema bilaterally Skin: No jaundice Neuro: Grossly intact  ASSESSMENT:  1.  Chronic loose stools 2.  Fecal incontinence 3.  Decreased rectal tone 4.  Edema of the lower extremities   PLAN:  1.  Reassured her that her edema was not related to her antidiarrheal therapy 2.  Resume Questran 3.  Lomotil as needed 4.  Continue protective undergarments 5.  See PCP regarding lower extremity edema 6.  GI follow-up as needed

## 2020-12-21 ENCOUNTER — Encounter: Payer: Self-pay | Admitting: Family Medicine

## 2020-12-21 ENCOUNTER — Other Ambulatory Visit: Payer: Self-pay

## 2020-12-21 DIAGNOSIS — M545 Low back pain, unspecified: Secondary | ICD-10-CM

## 2020-12-29 ENCOUNTER — Other Ambulatory Visit: Payer: Self-pay | Admitting: Nurse Practitioner

## 2020-12-29 DIAGNOSIS — I10 Essential (primary) hypertension: Secondary | ICD-10-CM

## 2020-12-30 ENCOUNTER — Ambulatory Visit
Admission: RE | Admit: 2020-12-30 | Discharge: 2020-12-30 | Disposition: A | Discharge status: Home / Self Care | Payer: Medicare Other | Source: Ambulatory Visit | Attending: Family Medicine | Admitting: Family Medicine

## 2020-12-30 ENCOUNTER — Other Ambulatory Visit: Payer: Self-pay

## 2020-12-30 DIAGNOSIS — M47817 Spondylosis without myelopathy or radiculopathy, lumbosacral region: Secondary | ICD-10-CM | POA: Diagnosis not present

## 2020-12-30 DIAGNOSIS — M545 Low back pain, unspecified: Secondary | ICD-10-CM

## 2020-12-30 MED ORDER — IOPAMIDOL (ISOVUE-M 200) INJECTION 41%
1.0000 mL | Freq: Once | INTRAMUSCULAR | Status: AC
  Administered 2020-12-30: 1 mL via EPIDURAL

## 2020-12-30 MED ORDER — METHYLPREDNISOLONE ACETATE 40 MG/ML INJ SUSP (RADIOLOG
120.0000 mg | Freq: Once | INTRAMUSCULAR | Status: AC
  Administered 2020-12-30: 120 mg via EPIDURAL

## 2020-12-30 NOTE — Discharge Instructions (Signed)

## 2021-01-03 DIAGNOSIS — Z20828 Contact with and (suspected) exposure to other viral communicable diseases: Secondary | ICD-10-CM | POA: Diagnosis not present

## 2021-01-06 DIAGNOSIS — Z20828 Contact with and (suspected) exposure to other viral communicable diseases: Secondary | ICD-10-CM | POA: Diagnosis not present

## 2021-01-10 DIAGNOSIS — Z20828 Contact with and (suspected) exposure to other viral communicable diseases: Secondary | ICD-10-CM | POA: Diagnosis not present

## 2021-01-13 ENCOUNTER — Other Ambulatory Visit: Payer: Self-pay | Admitting: Nurse Practitioner

## 2021-01-13 ENCOUNTER — Other Ambulatory Visit: Payer: Self-pay | Admitting: Family Medicine

## 2021-01-13 DIAGNOSIS — I10 Essential (primary) hypertension: Secondary | ICD-10-CM

## 2021-01-28 ENCOUNTER — Other Ambulatory Visit: Payer: Self-pay | Admitting: Nurse Practitioner

## 2021-01-28 DIAGNOSIS — I10 Essential (primary) hypertension: Secondary | ICD-10-CM

## 2021-01-31 NOTE — Progress Notes (Signed)
[place] Phone: [phone] Subjective:   Susan [name], [name] This visit occurred during the SARS-CoV-2 public health emergency.  Safety protocols were in place, including screening questions prior to the visit, additional usage of staff PPE, and extensive cleaning of exam room while observing appropriate contact time as indicated for disinfecting solutions.  I'm seeing this patient by the request  of:  Nche, Charlene Brooke, NP  CC: Back pain and hip pain follow-up  [id number]  Susan [name] is a 87 y.o. female coming in with complaint of back pain and bilateral hip pain.  Patient was sent for MRI of the lumbar spine in January 2022.  MRI did show mild to moderate spinal stenosis from L 3-S I.  Patient then undergone a right-sided L 3-L 4epidural.  This was on 12/30/2020.  Patient states that the epidural did help but still having pain over bilateral ischial tuberosities with sitting and standing.   Also continues to have pain over bilateral GTs especially with walking.       CONSIDER LFT's and lipase  Past Medical History:  Diagnosis Date  . Actinic keratosis, hx of    chest wall (2012)  . Allergic rhinitis   . Anxiety   . Arthritis    "spine, hips" (01/30/2017)  . Bursitis   . Cellulitis of right foot 01/30/2017  . Colon polyps   . Diverticulosis   . Esophageal dysmotilities 10/16/2017  . Fibromyalgia    "qwhere" (01/30/2017)  . Gallstones   . GERD (gastroesophageal reflux disease)   . High cholesterol   . History of kidney stones   . History of stomach ulcers   . Hx of squamous cell carcinoma excision    right mandible (2011), right arm (2014)  . Hypertension   . IBS (irritable bowel syndrome)   . Osteoarthritis   . Pneumonia 2000s X 2   "twice" (01/30/2017)  . Pyloric stenosis   . Stroke [facility]) 10/2014   denies residual on 01/30/2017   Past Surgical  History:  Procedure Laterality Date  . APPENDECTOMY    . CARDIOVASCULAR STRESS TEST  04/2016   nuclear cardiolite stress test done in [place] ([name] ischemia, [name] LVH, [name] LV systolic function)  . CATARACT EXTRACTION W/ INTRAOCULAR LENS  IMPLANT, BILATERAL Bilateral   . CHOLECYSTECTOMY OPEN    . COLONOSCOPY  2009, 2014  . DIAGNOSTIC MAMMOGRAM  2009, 2012  . TONSILLECTOMY     Social History   Socioeconomic History  . Marital status: Widowed    Spouse name: Not on file  . Number of children: 4  . Years of education: Not on file  . Highest education level: Not on file  Occupational History  . Occupation: retired  Tobacco Use  . Smoking status: Never Smoker  . Smokeless tobacco: Never Used  Vaping Use  . Vaping Use: Never used  Substance and Sexual Activity  . Alcohol use: Yes    Comment: glass of red wine maybe once week  . Drug use: [name]  . Sexual activity: Not on file  Other Topics Concern  . Not on file  Social History Narrative  . Not on file   Social Determinants of Health   Financial Resource Strain: Not on file  Food Insecurity: Not on file  Transportation Needs: Not on file  Physical Activity: Not on file  Stress: Not on file  Social Connections: Not on  file   Allergies  Allergen Reactions  . Butrans [Buprenorphine] Other (See Comments)    Severe chest pains  . Codeine Other (See Comments)    Severe Chest pains  . Morphine And Related Other (See Comments)    Severe chest pains   Family History  Problem Relation Age of Onset  . Stroke Mother   . Heart attack Mother   . Stroke Father   . Heart attack Father   . Heart attack Maternal Grandmother   . Stroke Maternal Grandmother   . Stroke Paternal Grandmother   . Heart attack Paternal Grandmother   . Heart attack Maternal Grandfather   . Stroke Maternal Grandfather   . Stroke Paternal Grandfather   . Heart attack Paternal Grandfather   . Colon cancer Neg Hx   . Pancreatic cancer Neg Hx   . Stomach  cancer Neg Hx   . Esophageal cancer Neg Hx     Current Outpatient Medications (Endocrine & Metabolic):  .  predniSONE (DELTASONE) 20 MG tablet, Take 1 tablet (20 mg total) by mouth daily with breakfast.  Current Outpatient Medications (Cardiovascular):  .  amLODipine (NORVASC) 5 MG tablet, TAKE 2 TABLETS BY MOUTH AT BEDTIME .  cholestyramine (QUESTRAN) 4 g packet, Take 1 packet (4 g total) by mouth 2 (two) times daily. .  furosemide (LASIX) 20 MG tablet, Take 1 tablet (20 mg total) by mouth daily. Marland Kitchen  losartan (COZAAR) 100 MG tablet, Take 1 tablet (100 mg total) by mouth daily. .  metoprolol succinate (TOPROL-XL) 25 MG 24 hr tablet, Take 1 tablet by mouth once daily  Current Outpatient Medications (Respiratory):  .  fexofenadine (ALLEGRA) 180 MG tablet, Take 180 mg by mouth daily.  Current Outpatient Medications (Analgesics):  .  acetaminophen (TYLENOL) 650 MG CR tablet, Take 650 mg by mouth every 8 (eight) hours as needed for pain. Marland Kitchen  aspirin EC 325 MG tablet, Take 325 mg by mouth daily.  Current Outpatient Medications (Hematological):  Marland Kitchen  IRON PO, Take 65 mg by mouth 3 (three) times a week.  Current Outpatient Medications (Other):  Marland Kitchen  ALPRAZolam (XANAX) 0.5 MG tablet, TAKE 1 TABLET BY MOUTH ONCE DAILY AS NEEDED FOR ANXIETY .  dicyclomine (BENTYL) 10 MG capsule, Take 1 capsule (10 mg total) by mouth 2 (two) times daily. .  diphenoxylate-atropine (LOMOTIL) 2.5-0.025 MG tablet, Take 1 tablet by mouth 2 (two) times daily as needed for diarrhea or loose stools. .  famotidine (PEPCID) 20 MG tablet, Take 1 tablet by mouth once daily .  meclizine (ANTIVERT) 25 MG tablet, Take 1 tablet (25 mg total) by mouth 3 (three) times daily as needed for dizziness. .  potassium chloride SA (KLOR-CON) 20 MEQ tablet, Take 1 tablet (20 mEq total) by mouth daily. .  Vitamin D, Ergocalciferol, (DRISDOL) 1.25 MG (50000 UNIT) CAPS capsule, Take 1 capsule by mouth once a week   Reviewed prior external  information including notes and imaging from  primary care provider As well as notes that were available from care everywhere and other healthcare systems.  Past medical history, social, surgical and family history all reviewed in electronic medical record.  [name] pertanent information unless stated regarding to the chief complaint.   Review of Systems:  [name] headache, visual changes, nausea, vomiting, diarrhea, constipation, dizziness, abdominal pain, skin rash, fevers, chills, night sweats, weight loss, swollen lymph nodes,  joint swelling, chest pain, shortness of breath, mood changes. POSITIVE muscle aches, body aches  Objective  Blood pressure 138/84,  pulse 78, height 5\' 1"  (1.549 m), weight 107 lb (48.5 kg), SpO2 96 %.   General: [name] apparent distress alert and oriented x3 mood and affect normal, dressed appropriately.  HEENT: Pupils equal, extraocular movements intact  Respiratory: Patient's speak in full sentences and does not appear short of breath  Cardiovascular: 1+ lower extremity edema, non tender, [name] erythema  Gait antalgic gait walking with the aid of a rolling walker Patient continues to have pain on the lateral aspect of the hip.  Tenderness to palpation in the paraspinal musculature of the lumbar spine as well.  Patient does have atrophy still noted around the greater trochanteric area bilaterally.  Tightness with FABER test bilaterally and does have a decrease in internal range of motion of the hips bilaterally.  Neurovascularly intact distally but does have some atrophy of the lower extremity musculature.   Impression and Recommendations:     The above documentation has been reviewed and is accurate and complete Lyndal Pulley, DO

## 2021-02-01 ENCOUNTER — Ambulatory Visit (INDEPENDENT_AMBULATORY_CARE_PROVIDER_SITE_OTHER): Payer: Medicare Other | Admitting: Family Medicine

## 2021-02-01 ENCOUNTER — Encounter: Payer: Self-pay | Admitting: Family Medicine

## 2021-02-01 ENCOUNTER — Other Ambulatory Visit: Payer: Self-pay

## 2021-02-01 VITALS — BP 138/84 | HR 78 | Ht 61.0 in | Wt 107.0 lb

## 2021-02-01 DIAGNOSIS — M5416 Radiculopathy, lumbar region: Secondary | ICD-10-CM

## 2021-02-01 DIAGNOSIS — M7062 Trochanteric bursitis, left hip: Secondary | ICD-10-CM

## 2021-02-01 DIAGNOSIS — E559 Vitamin D deficiency, unspecified: Secondary | ICD-10-CM

## 2021-02-01 DIAGNOSIS — M2550 Pain in unspecified joint: Secondary | ICD-10-CM

## 2021-02-01 DIAGNOSIS — E038 Other specified hypothyroidism: Secondary | ICD-10-CM | POA: Diagnosis not present

## 2021-02-01 DIAGNOSIS — M797 Fibromyalgia: Secondary | ICD-10-CM

## 2021-02-01 DIAGNOSIS — R7989 Other specified abnormal findings of blood chemistry: Secondary | ICD-10-CM

## 2021-02-01 DIAGNOSIS — M7061 Trochanteric bursitis, right hip: Secondary | ICD-10-CM | POA: Diagnosis not present

## 2021-02-01 LAB — CBC WITH DIFFERENTIAL/PLATELET
Basophils Absolute: 0 10*3/uL (ref 0.0–0.1)
Basophils Relative: 0.6 % (ref 0.0–3.0)
Eosinophils Absolute: 0.1 10*3/uL (ref 0.0–0.7)
Eosinophils Relative: 1 % (ref 0.0–5.0)
HCT: 38.8 % (ref 36.0–46.0)
Hemoglobin: 13 g/dL (ref 12.0–15.0)
Lymphocytes Relative: 33.1 % (ref 12.0–46.0)
Lymphs Abs: 1.9 10*3/uL (ref 0.7–4.0)
MCHC: 33.4 g/dL (ref 30.0–36.0)
MCV: 92.5 fl (ref 78.0–100.0)
Monocytes Absolute: 0.5 10*3/uL (ref 0.1–1.0)
Monocytes Relative: 9.3 % (ref 3.0–12.0)
Neutro Abs: 3.2 10*3/uL (ref 1.4–7.7)
Neutrophils Relative %: 56 % (ref 43.0–77.0)
Platelets: 257 10*3/uL (ref 150.0–400.0)
RBC: 4.19 Mil/uL (ref 3.87–5.11)
RDW: 13.9 % (ref 11.5–15.5)
WBC: 5.7 10*3/uL (ref 4.0–10.5)

## 2021-02-01 LAB — COMPREHENSIVE METABOLIC PANEL
ALT: 14 U/L (ref 0–35)
AST: 19 U/L (ref 0–37)
Albumin: 3.9 g/dL (ref 3.5–5.2)
Alkaline Phosphatase: 73 U/L (ref 39–117)
BUN: 21 mg/dL (ref 6–23)
CO2: 31 mEq/L (ref 19–32)
Calcium: 9.3 mg/dL (ref 8.4–10.5)
Chloride: 103 mEq/L (ref 96–112)
Creatinine, Ser: 0.55 mg/dL (ref 0.40–1.20)
GFR: 78.59 mL/min (ref >60.00)
Glucose, Bld: 116 mg/dL — ABNORMAL HIGH (ref 70–99)
Potassium: 3.9 mEq/L (ref 3.5–5.1)
Sodium: 140 mEq/L (ref 135–145)
Total Bilirubin: 0.4 mg/dL (ref 0.2–1.2)
Total Protein: 6.7 g/dL (ref 6.0–8.3)

## 2021-02-01 LAB — URIC ACID: Uric Acid, Serum: 3.5 mg/dL (ref 2.4–7.0)

## 2021-02-01 LAB — VITAMIN D 25 HYDROXY (VIT D DEFICIENCY, FRACTURES): VITD: 46.15 ng/mL (ref 30.00–100.00)

## 2021-02-01 LAB — SEDIMENTATION RATE: Sed Rate: 6 mm/hr (ref 0–30)

## 2021-02-01 LAB — TSH: TSH: 4.03 u[IU]/mL (ref 0.35–4.50)

## 2021-02-01 MED ORDER — PREDNISONE 20 MG PO TABS: 20.0000 mg | ORAL_TABLET | Freq: Every day | ORAL | 0 refills | Status: DC

## 2021-02-01 NOTE — Patient Instructions (Addendum)
Prednisone for next 5 days Labs today See me in 6-8 weeks

## 2021-02-01 NOTE — Assessment & Plan Note (Signed)
Patient is having lumbar radiculopathy, continues to have discomfort and pain though of multiple different areas.  Does have underlying fibromyalgia.  Does have signs that is consistent with greater trochanteric bursitis but has had significant atrophy on the lateral aspect of the hip.  Patient's last injections were back in October and will consider the possibility of repeating at follow-up.  At this point though would like to hold off and do more prednisone at a low dose for 5 days.  Warned of potential side effects which patient has responded though in the past in November like this.  We discussed we could also consider the possibility of repeating the epidural of the back.  We will hold at this point because it did not make as much improvement as we were hoping for.  Follow-up with me again in 4 to 6 weeks

## 2021-02-01 NOTE — Assessment & Plan Note (Signed)
History of fibromyalgia, continue to have chronic pain on the bilateral greater trochanteric areas and now the ischial bursa.  We will get laboratory work-up to make sure this is abnormal.  Follow-up with me again in 4 to 6 weeks otherwise.

## 2021-02-01 NOTE — Addendum Note (Signed)
Addended by: Jacobo Forest on: 02/01/2021 02:31 PM   Modules accepted: Orders

## 2021-02-03 ENCOUNTER — Other Ambulatory Visit: Payer: Self-pay | Admitting: Nurse Practitioner

## 2021-02-03 LAB — CALCIUM, IONIZED

## 2021-02-03 LAB — PTH, INTACT AND CALCIUM
Calcium: 9.1 mg/dL (ref 8.6–10.4)
PTH: 25 pg/mL (ref 14–64)

## 2021-02-07 DIAGNOSIS — Z20828 Contact with and (suspected) exposure to other viral communicable diseases: Secondary | ICD-10-CM | POA: Diagnosis not present

## 2021-02-21 DIAGNOSIS — Z20828 Contact with and (suspected) exposure to other viral communicable diseases: Secondary | ICD-10-CM | POA: Diagnosis not present

## 2021-02-28 ENCOUNTER — Other Ambulatory Visit: Payer: Self-pay | Admitting: Nurse Practitioner

## 2021-02-28 DIAGNOSIS — K2100 Gastro-esophageal reflux disease with esophagitis, without bleeding: Secondary | ICD-10-CM

## 2021-02-28 DIAGNOSIS — I872 Venous insufficiency (chronic) (peripheral): Secondary | ICD-10-CM

## 2021-02-28 DIAGNOSIS — I10 Essential (primary) hypertension: Secondary | ICD-10-CM

## 2021-03-08 NOTE — Progress Notes (Signed)
[place] [address] [place] Phone: [phone] Subjective:   I Susan [name] am serving as a Education administrator for Dr. Hulan Saas.  This visit occurred during the SARS-CoV-2 public health emergency.  Safety protocols were in place, including screening questions prior to the visit, additional usage of staff PPE, and extensive cleaning of exam room while observing appropriate contact time as indicated for disinfecting solutions.   I'm seeing this patient by the request  of:  Nche, Charlene Brooke, NP  CC:   [id number]   02/01/2021 History of fibromyalgia, continue to have chronic pain on the bilateral greater trochanteric areas and now the ischial bursa.  We will get laboratory work-up to make sure this is abnormal.  Follow-up with me again in 4 to 6 weeks otherwise.  Patient is having lumbar radiculopathy, continues to have discomfort and pain though of multiple different areas.  Does have underlying fibromyalgia.  Does have signs that is consistent with greater trochanteric bursitis but has had significant atrophy on the lateral aspect of the hip.  Patient's last injections were back in October and will consider the possibility of repeating at follow-up.  At this point though would like to hold off and do more prednisone at a low dose for 5 days.  Warned of potential side effects which patient has responded though in the past in November like this.  We discussed we could also consider the possibility of repeating the epidural of the back.  We will hold at this point because it did not make as much improvement as we were hoping for.  Follow-up with me again in 4 to 6 weeks   Update 03/09/2021 Susan [name] is a 87 y.o. female coming in with complaint of low back and hip pain. Patient states she is hurting today. Left hip is most painful. States she is hurting all over. States the epidural helps for a few weeks.  Patient states that the back pain now is  significantly increased again.  Stopping her from activity somewhat.  Patient just wants to stay as active as she can.   Past Medical History:  Diagnosis Date  . Actinic keratosis, hx of    chest wall (2012)  . Allergic rhinitis   . Anxiety   . Arthritis    "spine, hips" (01/30/2017)  . Bursitis   . Cellulitis of right foot 01/30/2017  . Colon polyps   . Diverticulosis   . Esophageal dysmotilities 10/16/2017  . Fibromyalgia    "qwhere" (01/30/2017)  . Gallstones   . GERD (gastroesophageal reflux disease)   . High cholesterol   . History of kidney stones   . History of stomach ulcers   . Hx of squamous cell carcinoma excision    right mandible (2011), right arm (2014)  . Hypertension   . IBS (irritable bowel syndrome)   . Osteoarthritis   . Pneumonia 2000s X 2   "twice" (01/30/2017)  . Pyloric stenosis   . Stroke [facility]) 10/2014   denies residual on 01/30/2017   Past Surgical History:  Procedure Laterality Date  . APPENDECTOMY    . CARDIOVASCULAR STRESS TEST  04/2016   nuclear cardiolite stress test done in [place] (no ischemia, no LVH, no LV systolic function)  . CATARACT EXTRACTION W/ INTRAOCULAR LENS  IMPLANT, BILATERAL Bilateral   . CHOLECYSTECTOMY OPEN    . COLONOSCOPY  2009, 2014  . DIAGNOSTIC MAMMOGRAM  2009, 2012  . TONSILLECTOMY     Social History  Socioeconomic History  . Marital status: Widowed    Spouse name: Not on file  . Number of children: 4  . Years of education: Not on file  . Highest education level: Not on file  Occupational History  . Occupation: retired  Tobacco Use  . Smoking status: Never Smoker  . Smokeless tobacco: Never Used  Vaping Use  . Vaping Use: Never used  Substance and Sexual Activity  . Alcohol use: Yes    Comment: glass of red wine maybe once week  . Drug use: No  . Sexual activity: Not on file  Other Topics Concern  . Not on file  Social History Narrative  . Not on file   Social Determinants of Health   Financial  Resource Strain: Not on file  Food Insecurity: Not on file  Transportation Needs: Not on file  Physical Activity: Not on file  Stress: Not on file  Social Connections: Not on file   Allergies  Allergen Reactions  . Butrans [Buprenorphine] Other (See Comments)    Severe chest pains  . Codeine Other (See Comments)    Severe Chest pains  . Morphine And Related Other (See Comments)    Severe chest pains   Family History  Problem Relation Age of Onset  . Stroke Mother   . Heart attack Mother   . Stroke Father   . Heart attack Father   . Heart attack Maternal Grandmother   . Stroke Maternal Grandmother   . Stroke Paternal Grandmother   . Heart attack Paternal Grandmother   . Heart attack Maternal Grandfather   . Stroke Maternal Grandfather   . Stroke Paternal Grandfather   . Heart attack Paternal Grandfather   . Colon cancer Neg Hx   . Pancreatic cancer Neg Hx   . Stomach cancer Neg Hx   . Esophageal cancer Neg Hx     Current Outpatient Medications (Endocrine & Metabolic):  .  predniSONE (DELTASONE) 20 MG tablet, Take 1 tablet (20 mg total) by mouth daily with breakfast.  Current Outpatient Medications (Cardiovascular):  .  amLODipine (NORVASC) 5 MG tablet, TAKE 2 TABLETS BY MOUTH AT BEDTIME .  cholestyramine (QUESTRAN) 4 g packet, Take 1 packet (4 g total) by mouth 2 (two) times daily. .  furosemide (LASIX) 20 MG tablet, Take 1 tablet by mouth once daily .  losartan (COZAAR) 50 MG tablet, Take 2 tablets by mouth once daily .  metoprolol succinate (TOPROL-XL) 25 MG 24 hr tablet, Take 1 tablet by mouth once daily  Current Outpatient Medications (Respiratory):  .  fexofenadine (ALLEGRA) 180 MG tablet, Take 180 mg by mouth daily.  Current Outpatient Medications (Analgesics):  .  acetaminophen (TYLENOL) 650 MG CR tablet, Take 650 mg by mouth every 8 (eight) hours as needed for pain. Marland Kitchen  aspirin EC 325 MG tablet, Take 325 mg by mouth daily.  Current Outpatient Medications  (Hematological):  Marland Kitchen  IRON PO, Take 65 mg by mouth 3 (three) times a week.  Current Outpatient Medications (Other):  Marland Kitchen  ALPRAZolam (XANAX) 0.5 MG tablet, TAKE 1 TABLET BY MOUTH ONCE DAILY AS NEEDED FOR ANXIETY .  dicyclomine (BENTYL) 10 MG capsule, Take 1 capsule (10 mg total) by mouth 2 (two) times daily. .  diphenoxylate-atropine (LOMOTIL) 2.5-0.025 MG tablet, Take 1 tablet by mouth 2 (two) times daily as needed for diarrhea or loose stools. .  famotidine (PEPCID) 20 MG tablet, Take 1 tablet by mouth once daily .  meclizine (ANTIVERT) 25 MG tablet, Take  1 tablet (25 mg total) by mouth 3 (three) times daily as needed for dizziness. .  potassium chloride SA (KLOR-CON) 20 MEQ tablet, Take 1 tablet (20 mEq total) by mouth daily. .  Vitamin D, Ergocalciferol, (DRISDOL) 1.25 MG (50000 UNIT) CAPS capsule, Take 1 capsule by mouth once a week   Reviewed prior external information including notes and imaging from  primary care provider As well as notes that were available from care everywhere and other healthcare systems.  Past medical history, social, surgical and family history all reviewed in electronic medical record.  No pertanent information unless stated regarding to the chief complaint.   Review of Systems:  No headache, visual changes, nausea, vomiting, diarrhea, constipation, dizziness, abdominal pain, skin rash, fevers, chills, night sweats, weight loss, swollen lymph nodes,chest pain, shortness of breath, mood changes. POSITIVE muscle aches, body aches, joint swelling  Objective  Blood pressure 132/80, pulse 69, height 5\' 1"  (1.549 m), weight 112 lb (50.8 kg), SpO2 98 %.   General: No apparent distress alert and oriented x3 mood and affect normal, dressed appropriately.  HEENT: Pupils equal, extraocular movements intact  Respiratory: Patient's speak in full sentences and does not appear short of breath  Cardiovascular: No lower extremity edema, non tender, no erythema  Gait walks with  the aid of a walker. Significant arthritic changes noted.  Severe tenderness to most of muscles palpated today.  Patient does have tightness noted in the parascapular region.  Patient is tender to palpation over the greater trochanteric area.  Patient does have positive Corky Sox.  Tightness with straight leg test that does cause worsening back pain but no true radicular symptoms.   Impression and Recommendations:     The above documentation has been reviewed and is accurate and complete Lyndal Pulley, DO

## 2021-03-09 ENCOUNTER — Encounter: Payer: Self-pay | Admitting: Family Medicine

## 2021-03-09 ENCOUNTER — Other Ambulatory Visit: Payer: Self-pay

## 2021-03-09 ENCOUNTER — Ambulatory Visit (INDEPENDENT_AMBULATORY_CARE_PROVIDER_SITE_OTHER): Payer: Medicare Other | Admitting: Family Medicine

## 2021-03-09 VITALS — BP 132/80 | HR 69 | Ht 61.0 in | Wt 112.0 lb

## 2021-03-09 DIAGNOSIS — M7062 Trochanteric bursitis, left hip: Secondary | ICD-10-CM | POA: Diagnosis not present

## 2021-03-09 DIAGNOSIS — M7061 Trochanteric bursitis, right hip: Secondary | ICD-10-CM | POA: Diagnosis not present

## 2021-03-09 DIAGNOSIS — M545 Low back pain, unspecified: Secondary | ICD-10-CM

## 2021-03-09 DIAGNOSIS — G8929 Other chronic pain: Secondary | ICD-10-CM

## 2021-03-09 DIAGNOSIS — M5416 Radiculopathy, lumbar region: Secondary | ICD-10-CM | POA: Diagnosis not present

## 2021-03-09 MED ORDER — KETOROLAC TROMETHAMINE 30 MG/ML IJ SOLN
30.0000 mg | Freq: Once | INTRAMUSCULAR | Status: AC
  Administered 2021-03-09: 15 mg via INTRAMUSCULAR

## 2021-03-09 MED ORDER — METHYLPREDNISOLONE ACETATE 40 MG/ML IJ SUSP
40.0000 mg | Freq: Once | INTRAMUSCULAR | Status: AC
  Administered 2021-03-09: 40 mg via INTRAMUSCULAR

## 2021-03-09 NOTE — Assessment & Plan Note (Signed)
Patient has had some atrophy of the hips bilaterally and we will continue to monitor.  Worsening symptoms we could consider the injection just from palliative good concern with atrophy noted.  Patient noted feels like those injections were helping with some of her fibromyalgia pain as well.

## 2021-03-09 NOTE — Patient Instructions (Signed)
Good to see you Epidural ordered Pennsaid 2 times a day  See me again in 6 weeks after epidural

## 2021-03-09 NOTE — Assessment & Plan Note (Signed)
Lumbar radiculopathy.  Chronic problem with exacerbation.  Toradol and Depo-Medrol given again today.  Hopefully this will be beneficial.  We will repeat to the epidural and hopefully will get a longer duration of improvement.  Last injection was in January.  Patient will follow up with me 4 to 6 weeks after the injection to see how patient responds.

## 2021-03-14 ENCOUNTER — Ambulatory Visit: Payer: Medicare Other | Admitting: Nurse Practitioner

## 2021-03-16 ENCOUNTER — Ambulatory Visit: Payer: Medicare Other | Admitting: Family Medicine

## 2021-03-17 ENCOUNTER — Ambulatory Visit: Payer: Medicare Other | Admitting: Nurse Practitioner

## 2021-03-21 DIAGNOSIS — Z20828 Contact with and (suspected) exposure to other viral communicable diseases: Secondary | ICD-10-CM | POA: Diagnosis not present

## 2021-03-28 ENCOUNTER — Other Ambulatory Visit: Payer: Self-pay

## 2021-03-28 ENCOUNTER — Ambulatory Visit (INDEPENDENT_AMBULATORY_CARE_PROVIDER_SITE_OTHER): Payer: Medicare Other | Admitting: Nurse Practitioner

## 2021-03-28 ENCOUNTER — Encounter: Payer: Self-pay | Admitting: Nurse Practitioner

## 2021-03-28 DIAGNOSIS — I872 Venous insufficiency (chronic) (peripheral): Secondary | ICD-10-CM | POA: Diagnosis not present

## 2021-03-28 DIAGNOSIS — I10 Essential (primary) hypertension: Secondary | ICD-10-CM

## 2021-03-28 DIAGNOSIS — K2100 Gastro-esophageal reflux disease with esophagitis, without bleeding: Secondary | ICD-10-CM

## 2021-03-28 DIAGNOSIS — F418 Other specified anxiety disorders: Secondary | ICD-10-CM

## 2021-03-28 DIAGNOSIS — Z20828 Contact with and (suspected) exposure to other viral communicable diseases: Secondary | ICD-10-CM | POA: Diagnosis not present

## 2021-03-28 DIAGNOSIS — K529 Noninfective gastroenteritis and colitis, unspecified: Secondary | ICD-10-CM

## 2021-03-28 MED ORDER — LOSARTAN POTASSIUM 50 MG PO TABS
2.0000 | ORAL_TABLET | Freq: Every day | ORAL | 3 refills | Status: DC
Start: 2021-03-28 — End: 2021-06-27

## 2021-03-28 MED ORDER — FAMOTIDINE 20 MG PO TABS
20.0000 mg | ORAL_TABLET | Freq: Every day | ORAL | 1 refills | Status: DC
Start: 2021-03-28 — End: 2021-11-30

## 2021-03-28 MED ORDER — FUROSEMIDE 20 MG PO TABS: 20.0000 mg | ORAL_TABLET | Freq: Every day | ORAL | 1 refills | Status: DC

## 2021-03-28 MED ORDER — AMLODIPINE BESYLATE 5 MG PO TABS: 2.0000 | ORAL_TABLET | Freq: Every day | ORAL | 3 refills | Status: DC

## 2021-03-28 MED ORDER — METOPROLOL SUCCINATE ER 25 MG PO TB24: 1.0000 | ORAL_TABLET | Freq: Every day | ORAL | 3 refills | Status: DC

## 2021-03-28 MED ORDER — ALPRAZOLAM 0.5 MG PO TABS: 0.5000 mg | ORAL_TABLET | Freq: Every day | ORAL | 5 refills | Status: DC | PRN

## 2021-03-28 MED ORDER — POTASSIUM CHLORIDE CRYS ER 20 MEQ PO TBCR: 20.0000 meq | EXTENDED_RELEASE_TABLET | Freq: Every day | ORAL | 1 refills | Status: DC

## 2021-03-28 NOTE — Assessment & Plan Note (Signed)
Stable mood with alprazolam once day Last filled 02/28/2021 Also fill lomotil every other month. No red flags, no daytime somnolence, no fall

## 2021-03-28 NOTE — Assessment & Plan Note (Addendum)
Under Dr. Blanch Media care (GI) Unable to tolerate questran BID (bloating) Current use of lomotil and questran once a day. Denies any constipation or ABD pain or nasuea.

## 2021-03-28 NOTE — Assessment & Plan Note (Signed)
BP at goal Reviewed CMP completed by Dr. Tamala Julian. BP Readings from Last 3 Encounters:  03/28/21 124/60  03/09/21 132/80  02/01/21 138/84   Medication refills sent

## 2021-03-28 NOTE — Assessment & Plan Note (Signed)
Symptoms controlled with famotidine Refill sent

## 2021-03-28 NOTE — Patient Instructions (Addendum)
Continue current medications. 

## 2021-03-28 NOTE — Progress Notes (Signed)
 Subjective:  Patient ID: Susan [name], female    DOB: 06/03/1924  Age: 87 y.o. MRN: [id number]  CC: Follow-up (6 monthf/u on HTN. Pt checks BP at home and states they have been low. )  HPI  Essential hypertension BP at goal Reviewed CMP completed by Dr. Smith. BP Readings from Last 3 Encounters:  03/28/21 124/60  03/09/21 132/80  02/01/21 138/84   Medication refills sent  GERD with esophagitis Symptoms controlled with famotidine Refill sent  Chronic diarrhea Under Dr. Perry's care (GI) Unable to tolerate questran BID (bloating) Current use of lomotil and questran once a day. Denies any constipation or ABD pain or nasuea.  BP Readings from Last 3 Encounters:  03/28/21 124/60  03/09/21 132/80  02/01/21 138/84   Wt Readings from Last 3 Encounters:  03/28/21 104 lb 12.8 oz (47.5 kg)  03/09/21 112 lb (50.8 kg)  02/01/21 107 lb (48.5 kg)    Reviewed past Medical, Social and Family history today.  Outpatient Medications Prior to Visit  Medication Sig Dispense Refill  . acetaminophen (TYLENOL) 650 MG CR tablet Take 650 mg by mouth every 8 (eight) hours as needed for pain.    . aspirin EC 325 MG tablet Take 325 mg by mouth daily.    . cholestyramine (QUESTRAN) 4 g packet Take 1 packet (4 g total) by mouth 2 (two) times daily. 60 each 6  . dicyclomine (BENTYL) 10 MG capsule Take 1 capsule (10 mg total) by mouth 2 (two) times daily. 180 capsule 3  . diphenoxylate-atropine (LOMOTIL) 2.5-0.025 MG tablet Take 1 tablet by mouth 2 (two) times daily as needed for diarrhea or loose stools. 60 tablet 6  . fexofenadine (ALLEGRA) 180 MG tablet Take 180 mg by mouth daily.    . IRON PO Take 65 mg by mouth 3 (three) times a week.    . meclizine (ANTIVERT) 25 MG tablet Take 1 tablet (25 mg total) by mouth 3 (three) times daily as needed for dizziness. 30 tablet 0  . Vitamin D, Ergocalciferol, (DRISDOL) 1.25 MG (50000 UNIT) CAPS capsule Take 1 capsule by mouth once a week 12 capsule 0  .  ALPRAZolam (XANAX) 0.5 MG tablet TAKE 1 TABLET BY MOUTH ONCE DAILY AS NEEDED FOR ANXIETY 30 tablet 5  . amLODipine (NORVASC) 5 MG tablet TAKE 2 TABLETS BY MOUTH AT BEDTIME 90 tablet 0  . famotidine (PEPCID) 20 MG tablet Take 1 tablet by mouth once daily 90 tablet 0  . furosemide (LASIX) 20 MG tablet Take 1 tablet by mouth once daily 90 tablet 0  . losartan (COZAAR) 50 MG tablet Take 2 tablets by mouth once daily 120 tablet 0  . metoprolol succinate (TOPROL-XL) 25 MG 24 hr tablet Take 1 tablet by mouth once daily 90 tablet 0  . potassium chloride SA (KLOR-CON) 20 MEQ tablet Take 1 tablet (20 mEq total) by mouth daily. 90 tablet 1  . predniSONE (DELTASONE) 20 MG tablet Take 1 tablet (20 mg total) by mouth daily with breakfast. 5 tablet 0   No facility-administered medications prior to visit.    ROS See HPI  Objective:  BP 124/60 (BP Location: Left Arm, Patient Position: Sitting, Cuff Size: Normal)   Pulse 62   Temp 98.4 F (36.9 C) (Temporal)   Ht 5' 1" (1.549 m)   Wt 104 lb 12.8 oz (47.5 kg)   SpO2 96%   BMI 19.80 kg/m   Reviewed lab results completed by Dr. Smith on 02/01/2021: CBC, CMP, uric acid,   ESR, vit. D, and TSH (normal) Physical Exam Cardiovascular:     Rate and Rhythm: Normal rate.     Pulses: Normal pulses.  Pulmonary:     Effort: Pulmonary effort is normal.  Abdominal:     General: Bowel sounds are normal. There is no distension.     Palpations: Abdomen is soft.     Tenderness: There is no abdominal tenderness.  Neurological:     Mental Status: She is alert and oriented to person, place, and time.  Psychiatric:        Mood and Affect: Mood normal.        Behavior: Behavior normal.        Thought Content: Thought content normal.    Assessment & Plan:  This visit occurred during the SARS-CoV-2 public health emergency.  Safety protocols were in place, including screening questions prior to the visit, additional usage of staff PPE, and extensive cleaning of exam  room while observing appropriate contact time as indicated for disinfecting solutions.   Susan [name] was seen today for follow-up.  Diagnoses and all orders for this visit:  Essential hypertension -     amLODipine (NORVASC) 5 MG tablet; Take 2 tablets (10 mg total) by mouth at bedtime. -     furosemide (LASIX) 20 MG tablet; Take 1 tablet (20 mg total) by mouth daily. -     losartan (COZAAR) 50 MG tablet; Take 2 tablets (100 mg total) by mouth daily. -     metoprolol succinate (TOPROL-XL) 25 MG 24 hr tablet; Take 1 tablet (25 mg total) by mouth daily.  Gastroesophageal reflux disease with esophagitis without hemorrhage -     famotidine (PEPCID) 20 MG tablet; Take 1 tablet (20 mg total) by mouth daily.  Venous insufficiency of both lower extremities -     furosemide (LASIX) 20 MG tablet; Take 1 tablet (20 mg total) by mouth daily. -     potassium chloride SA (KLOR-CON) 20 MEQ tablet; Take 1 tablet (20 mEq total) by mouth daily.  Chronic diarrhea  Anxiety about health -     ALPRAZolam (XANAX) 0.5 MG tablet; Take 1 tablet (0.5 mg total) by mouth daily as needed for anxiety.   Problem List Items Addressed This Visit      Cardiovascular and Mediastinum   Essential hypertension    BP at goal Reviewed CMP completed by Dr. Smith. BP Readings from Last 3 Encounters:  03/28/21 124/60  03/09/21 132/80  02/01/21 138/84   Medication refills sent      Relevant Medications   amLODipine (NORVASC) 5 MG tablet   furosemide (LASIX) 20 MG tablet   losartan (COZAAR) 50 MG tablet   metoprolol succinate (TOPROL-XL) 25 MG 24 hr tablet   Venous insufficiency of both lower extremities   Relevant Medications   amLODipine (NORVASC) 5 MG tablet   furosemide (LASIX) 20 MG tablet   losartan (COZAAR) 50 MG tablet   metoprolol succinate (TOPROL-XL) 25 MG 24 hr tablet   potassium chloride SA (KLOR-CON) 20 MEQ tablet     Digestive   Chronic diarrhea    Under Dr. Perry's care (GI) Unable to tolerate  questran BID (bloating) Current use of lomotil and questran once a day. Denies any constipation or ABD pain or nasuea.      GERD with esophagitis    Symptoms controlled with famotidine Refill sent      Relevant Medications   famotidine (PEPCID) 20 MG tablet     Other   Anxiety about   health   Relevant Medications   ALPRAZolam (XANAX) 0.5 MG tablet      Follow-up: Return in about 3 months (around 06/27/2021) for HTN and venous insufficiency (74mns).  CWilfred Lacy NP

## 2021-04-04 DIAGNOSIS — Z20828 Contact with and (suspected) exposure to other viral communicable diseases: Secondary | ICD-10-CM | POA: Diagnosis not present

## 2021-04-11 DIAGNOSIS — Z20828 Contact with and (suspected) exposure to other viral communicable diseases: Secondary | ICD-10-CM | POA: Diagnosis not present

## 2021-04-18 ENCOUNTER — Other Ambulatory Visit: Payer: Self-pay

## 2021-04-18 ENCOUNTER — Other Ambulatory Visit: Payer: Medicare Other

## 2021-04-18 ENCOUNTER — Ambulatory Visit (INDEPENDENT_AMBULATORY_CARE_PROVIDER_SITE_OTHER): Payer: Medicare Other | Admitting: Family Medicine

## 2021-04-18 VITALS — BP 118/76 | HR 82 | Ht 61.0 in | Wt 103.0 lb

## 2021-04-18 DIAGNOSIS — M7062 Trochanteric bursitis, left hip: Secondary | ICD-10-CM | POA: Diagnosis not present

## 2021-04-18 DIAGNOSIS — M545 Low back pain, unspecified: Secondary | ICD-10-CM

## 2021-04-18 DIAGNOSIS — Z20828 Contact with and (suspected) exposure to other viral communicable diseases: Secondary | ICD-10-CM | POA: Diagnosis not present

## 2021-04-18 DIAGNOSIS — M7061 Trochanteric bursitis, right hip: Secondary | ICD-10-CM | POA: Diagnosis not present

## 2021-04-18 NOTE — Patient Instructions (Addendum)
Thank you for coming in today.  Use heating pad.   Use TENS unit.     TENS UNIT: This is helpful for muscle pain and spasm.   Search and Purchase a TENS 7000 2nd edition at  www.tenspros.com or www.[employer].com It should be less than $30.     TENS unit instructions: Do not shower or bathe with the unit on . Turn the unit off before removing electrodes or batteries . If the electrodes lose stickiness add a drop of water to the electrodes after they are disconnected from the unit and place on plastic sheet. If you continued to have difficulty, call the TENS unit company to purchase more electrodes. . Do not apply lotion on the skin area prior to use. Make sure the skin is clean and dry as this will help prolong the life of the electrodes. . After use, always check skin for unusual red areas, rash or other skin difficulties. If there are any skin problems, does not apply electrodes to the same area. . Never remove the electrodes from the unit by pulling the wires. . Do not use the TENS unit or electrodes other than as directed. . Do not change electrode placement without consultating your therapist or physician. Marland Kitchen Keep 2 fingers with between each electrode. . Wear time ratio is 2:1, on to off times.    For example on for 30 minutes off for 15 minutes and then on for 30 minutes off for 15 minutes   Recheck with Dr Tamala Julian or me Dr Georgina Snell in 4-6 weeks.   Return or contact me sooner if you have a problem.

## 2021-04-18 NOTE — Progress Notes (Signed)
   I, Peterson Lombard, LAT, ATC acting as a scribe for Lynne Leader, MD.  Susan [name] is a 87 y.o. female who presents to [place] at [facility] today for low back pain. Pt was previously seen by Dr. Tamala Julian on 03/09/21 for LBP and bilat hip pain and was given a Toradol and Depo-Medrol injection and was referred for another epidural steroid injection (scheduled for 5/25). Of note, pt has a hx of fibromyalgia. Today, pt reports LBP ongoing since Friday, 5/13 w/ no mechanism. Pt reports she woke up w/ back pain. Pt notes she had her COVID booster on Tuesday and spent much of the day Wed in bed suffering from body aches. Pt locates pain to mostly on L-side low back w/ some pain on the R. No numbness/tingling reported.  She has a significant history of recurrent greater trochanteric bursitis with multiple injections. She is scheduled to have an epidural steroid injection on May 25.   Radiates: yes- sometimes into R leg Aggravates: walking, sitting, standing Treatments tried: Tylenol, hemp cream, ice, heat  Dx imaging: 12/16/20 L-spine MRI  05/26/20 L-spine XR  Pertinent review of systems: No fevers or chills  Relevant historical information: Lumbar radiculopathy.   Exam:  BP 118/76 (BP Location: Right Arm, Patient Position: Sitting, Cuff Size: Normal)   Pulse 82   Ht 5\' 1"  (1.549 m)   Wt 103 lb (46.7 kg)   SpO2 98%   BMI 19.46 kg/m  General: Well Developed, well nourished, and in no acute distress.   MSK: L-spine normal-appearing Nontender midline. Tender palpation left lumbar paraspinal musculature. Decreased lumbar motion. Lower extremity strength is intact distally except noted below. Negative slump test.  Left hip normal.  Normal motion.  Tender palpation greater trochanter.  Reduced hip abduction strength.  Antalgic gait     Assessment and Plan: 87 y.o. female with acute low back pain left-sided with some lateral hip pain.  This is consistent with acute  muscle strain.  Not sure how this directly relates to the COVID-vaccine.  I think it may be tangentially related predominantly caused by a reduced activity level because of the increased muscle pain.  Main issue is muscle spasm and dysfunction.  Plan for heating pad and TENS unit and physical therapy.  Proceed with epidural steroid injection as scheduled on the 25th.  Recheck with myself or Dr. Tamala Julian in 1 month or sooner if needed.  Discussed muscle relaxers and opiates.  Both medicines are more likely to cause harm to be helpful at this time.   PDMP not reviewed this encounter. Orders Placed This Encounter  Procedures  . Ambulatory referral to Physical Therapy    Referral Priority:   Routine    Referral Type:   Physical Medicine    Referral Reason:   Specialty Services Required    Requested Specialty:   Physical Therapy    Number of Visits Requested:   1   No orders of the defined types were placed in this encounter.    Discussed warning signs or symptoms. Please see discharge instructions. Patient expresses understanding.   The above documentation has been reviewed and is accurate and complete Lynne Leader, M.D.

## 2021-04-22 DIAGNOSIS — R2681 Unsteadiness on feet: Secondary | ICD-10-CM | POA: Diagnosis not present

## 2021-04-22 DIAGNOSIS — M5459 Other low back pain: Secondary | ICD-10-CM | POA: Diagnosis not present

## 2021-04-22 DIAGNOSIS — M6281 Muscle weakness (generalized): Secondary | ICD-10-CM | POA: Diagnosis not present

## 2021-04-22 DIAGNOSIS — R262 Difficulty in walking, not elsewhere classified: Secondary | ICD-10-CM | POA: Diagnosis not present

## 2021-04-25 DIAGNOSIS — R262 Difficulty in walking, not elsewhere classified: Secondary | ICD-10-CM | POA: Diagnosis not present

## 2021-04-25 DIAGNOSIS — M6281 Muscle weakness (generalized): Secondary | ICD-10-CM | POA: Diagnosis not present

## 2021-04-25 DIAGNOSIS — Z20828 Contact with and (suspected) exposure to other viral communicable diseases: Secondary | ICD-10-CM | POA: Diagnosis not present

## 2021-04-25 DIAGNOSIS — R2681 Unsteadiness on feet: Secondary | ICD-10-CM | POA: Diagnosis not present

## 2021-04-25 DIAGNOSIS — M5459 Other low back pain: Secondary | ICD-10-CM | POA: Diagnosis not present

## 2021-04-26 DIAGNOSIS — R262 Difficulty in walking, not elsewhere classified: Secondary | ICD-10-CM | POA: Diagnosis not present

## 2021-04-26 DIAGNOSIS — M5459 Other low back pain: Secondary | ICD-10-CM | POA: Diagnosis not present

## 2021-04-26 DIAGNOSIS — M6281 Muscle weakness (generalized): Secondary | ICD-10-CM | POA: Diagnosis not present

## 2021-04-26 DIAGNOSIS — R2681 Unsteadiness on feet: Secondary | ICD-10-CM | POA: Diagnosis not present

## 2021-04-27 ENCOUNTER — Other Ambulatory Visit: Payer: Self-pay

## 2021-04-27 ENCOUNTER — Ambulatory Visit
Admission: RE | Admit: 2021-04-27 | Discharge: 2021-04-27 | Disposition: A | Discharge status: Home / Self Care | Payer: Medicare Other | Source: Ambulatory Visit | Attending: Family Medicine | Admitting: Family Medicine

## 2021-04-27 DIAGNOSIS — G8929 Other chronic pain: Secondary | ICD-10-CM

## 2021-04-27 DIAGNOSIS — M545 Low back pain, unspecified: Secondary | ICD-10-CM

## 2021-04-27 DIAGNOSIS — M47817 Spondylosis without myelopathy or radiculopathy, lumbosacral region: Secondary | ICD-10-CM | POA: Diagnosis not present

## 2021-04-27 MED ORDER — METHYLPREDNISOLONE ACETATE 40 MG/ML INJ SUSP (RADIOLOG
80.0000 mg | Freq: Once | INTRAMUSCULAR | Status: AC
  Administered 2021-04-27: 80 mg via EPIDURAL

## 2021-04-27 MED ORDER — IOPAMIDOL (ISOVUE-M 200) INJECTION 41%
1.0000 mL | Freq: Once | INTRAMUSCULAR | Status: AC
  Administered 2021-04-27: 1 mL via EPIDURAL

## 2021-04-27 NOTE — Discharge Instructions (Signed)
Post Procedure Spinal Discharge Instruction Sheet  1. You may resume a regular diet and any medications that you routinely take (including pain medications).  2. No driving day of procedure.  3. Light activity throughout the rest of the day.  Do not do any strenuous work, exercise, bending or lifting.  The day following the procedure, you can resume normal physical activity but you should refrain from exercising or physical therapy for at least three days thereafter.   Common Side Effects:   Headaches- take your usual medications as directed by your physician.  Increase your fluid intake.  Caffeinated beverages may be helpful.  Lie flat in bed until your headache resolves.   Restlessness or inability to sleep- you may have trouble sleeping for the next few days.  Ask your referring physician if you need any medication for sleep.   Facial flushing or redness- should subside within a few days.   Increased pain- a temporary increase in pain a day or two following your procedure is not unusual.  Take your pain medication as prescribed by your referring physician.   Leg cramps  Please contact our office at [phone] for the following symptoms:  Fever greater than 100 degrees.  Headaches unresolved with medication after 2-3 days.  Increased swelling, pain, or redness at injection site.  YOU MAY RESUME YOUR ASPRIN ANYTIME AFTER PROCEDURE TODAY 04/27/21  Thank you for visiting [facility] today.

## 2021-04-29 DIAGNOSIS — R2681 Unsteadiness on feet: Secondary | ICD-10-CM | POA: Diagnosis not present

## 2021-04-29 DIAGNOSIS — M6281 Muscle weakness (generalized): Secondary | ICD-10-CM | POA: Diagnosis not present

## 2021-04-29 DIAGNOSIS — R262 Difficulty in walking, not elsewhere classified: Secondary | ICD-10-CM | POA: Diagnosis not present

## 2021-04-29 DIAGNOSIS — M5459 Other low back pain: Secondary | ICD-10-CM | POA: Diagnosis not present

## 2021-05-02 DIAGNOSIS — Z20828 Contact with and (suspected) exposure to other viral communicable diseases: Secondary | ICD-10-CM | POA: Diagnosis not present

## 2021-05-03 DIAGNOSIS — M6281 Muscle weakness (generalized): Secondary | ICD-10-CM | POA: Diagnosis not present

## 2021-05-03 DIAGNOSIS — M5459 Other low back pain: Secondary | ICD-10-CM | POA: Diagnosis not present

## 2021-05-03 DIAGNOSIS — R2681 Unsteadiness on feet: Secondary | ICD-10-CM | POA: Diagnosis not present

## 2021-05-03 DIAGNOSIS — R262 Difficulty in walking, not elsewhere classified: Secondary | ICD-10-CM | POA: Diagnosis not present

## 2021-05-04 DIAGNOSIS — M5459 Other low back pain: Secondary | ICD-10-CM | POA: Diagnosis not present

## 2021-05-04 DIAGNOSIS — R2681 Unsteadiness on feet: Secondary | ICD-10-CM | POA: Diagnosis not present

## 2021-05-04 DIAGNOSIS — M6281 Muscle weakness (generalized): Secondary | ICD-10-CM | POA: Diagnosis not present

## 2021-05-04 DIAGNOSIS — R262 Difficulty in walking, not elsewhere classified: Secondary | ICD-10-CM | POA: Diagnosis not present

## 2021-05-06 DIAGNOSIS — M5459 Other low back pain: Secondary | ICD-10-CM | POA: Diagnosis not present

## 2021-05-06 DIAGNOSIS — R2681 Unsteadiness on feet: Secondary | ICD-10-CM | POA: Diagnosis not present

## 2021-05-06 DIAGNOSIS — R262 Difficulty in walking, not elsewhere classified: Secondary | ICD-10-CM | POA: Diagnosis not present

## 2021-05-06 DIAGNOSIS — M6281 Muscle weakness (generalized): Secondary | ICD-10-CM | POA: Diagnosis not present

## 2021-05-09 DIAGNOSIS — Z20828 Contact with and (suspected) exposure to other viral communicable diseases: Secondary | ICD-10-CM | POA: Diagnosis not present

## 2021-05-10 DIAGNOSIS — R262 Difficulty in walking, not elsewhere classified: Secondary | ICD-10-CM | POA: Diagnosis not present

## 2021-05-10 DIAGNOSIS — M5459 Other low back pain: Secondary | ICD-10-CM | POA: Diagnosis not present

## 2021-05-10 DIAGNOSIS — R2681 Unsteadiness on feet: Secondary | ICD-10-CM | POA: Diagnosis not present

## 2021-05-10 DIAGNOSIS — M6281 Muscle weakness (generalized): Secondary | ICD-10-CM | POA: Diagnosis not present

## 2021-05-12 DIAGNOSIS — Z20828 Contact with and (suspected) exposure to other viral communicable diseases: Secondary | ICD-10-CM | POA: Diagnosis not present

## 2021-05-13 DIAGNOSIS — R262 Difficulty in walking, not elsewhere classified: Secondary | ICD-10-CM | POA: Diagnosis not present

## 2021-05-13 DIAGNOSIS — M6281 Muscle weakness (generalized): Secondary | ICD-10-CM | POA: Diagnosis not present

## 2021-05-13 DIAGNOSIS — R2681 Unsteadiness on feet: Secondary | ICD-10-CM | POA: Diagnosis not present

## 2021-05-13 DIAGNOSIS — M5459 Other low back pain: Secondary | ICD-10-CM | POA: Diagnosis not present

## 2021-05-17 ENCOUNTER — Ambulatory Visit (INDEPENDENT_AMBULATORY_CARE_PROVIDER_SITE_OTHER): Payer: Medicare Other | Admitting: *Deleted

## 2021-05-17 DIAGNOSIS — Z0000 Encounter for general adult medical examination without abnormal findings: Secondary | ICD-10-CM

## 2021-05-17 NOTE — Patient Instructions (Signed)
Susan [name] , Thank you for taking time to come for your Medicare Wellness Visit. I appreciate your ongoing commitment to your health goals. Please review the following plan we discussed and let me know if I can assist you in the future.   Screening recommendations/referrals: Colonoscopy: no longer needed due to age 87:  no longer needed due to age  Recommended yearly ophthalmology/optometry visit for glaucoma screening and checkup Recommended yearly dental visit for hygiene and checkup  Vaccinations: Influenza vaccine: up to date Pneumococcal vaccine: 1 of two completed / Education provided Tdap vaccine: due / Education provided Shingles vaccine: Due / Education provided   Advanced directives: yes on file  Conditions/risks identified: na  Next appointment: 06-27-2021 1:30 Dr. Lorayne Marek   Preventive Care 60 Years and Older, Female Preventive care refers to lifestyle choices and visits with your health care provider that can promote health and wellness. What does preventive care include? A yearly physical exam. This is also called an annual well check. Dental exams once or twice a year. Routine eye exams. Ask your health care provider how often you should have your eyes checked. Personal lifestyle choices, including: Daily care of your teeth and gums. Regular physical activity. Eating a healthy diet. Avoiding tobacco and drug use. Limiting alcohol use. Practicing safe sex. Taking low-dose aspirin every day. Taking vitamin and mineral supplements as recommended by your health care provider. What happens during an annual well check? The services and screenings done by your health care provider during your annual well check will depend on your age, overall health, lifestyle risk factors, and family history of disease. Counseling  Your health care provider may ask you questions about your: Alcohol use. Tobacco use. Drug use. Emotional well-being. Home and relationship  well-being. Sexual activity. Eating habits. History of falls. Memory and ability to understand (cognition). Work and work Statistician. Reproductive health. Screening  You may have the following tests or measurements: Height, weight, and BMI. Blood pressure. Lipid and cholesterol levels. These may be checked every 5 years, or more frequently if you are over 94 years old. Skin check. Lung cancer screening. You may have this screening every year starting at age 13 if you have a 30-pack-year history of smoking and currently smoke or have quit within the past 15 years. Fecal occult blood test (FOBT) of the stool. You may have this test every year starting at age 51. Flexible sigmoidoscopy or colonoscopy. You may have a sigmoidoscopy every 5 years or a colonoscopy every 10 years starting at age 28. Hepatitis C blood test. Hepatitis B blood test. Sexually transmitted disease (STD) testing. Diabetes screening. This is done by checking your blood sugar (glucose) after you have not eaten for a while (fasting). You may have this done every 1-3 years. Bone density scan. This is done to screen for osteoporosis. You may have this done starting at age 19. Mammogram. This may be done every 1-2 years. Talk to your health care provider about how often you should have regular mammograms. Talk with your health care provider about your test results, treatment options, and if necessary, the need for more tests. Vaccines  Your health care provider may recommend certain vaccines, such as: Influenza vaccine. This is recommended every year. Tetanus, diphtheria, and acellular pertussis (Tdap, Td) vaccine. You may need a Td booster every 10 years. Zoster vaccine. You may need this after age 46. Pneumococcal 13-valent conjugate (PCV13) vaccine. One dose is recommended after age 99. Pneumococcal polysaccharide (PPSV23) vaccine. One dose is  recommended after age 77. Talk to your health care provider about which  screenings and vaccines you need and how often you need them. This information is not intended to replace advice given to you by your health care provider. Make sure you discuss any questions you have with your health care provider. Document Released: 12/17/2015 Document Revised: 08/09/2016 Document Reviewed: 09/21/2015 Elsevier Interactive Patient Education  2017 [place] Prevention in the Home Falls can cause injuries. They can happen to people of all ages. There are many things you can do to make your home safe and to help prevent falls. What can I do on the outside of my home? Regularly fix the edges of walkways and driveways and fix any cracks. Remove anything that might make you trip as you walk through a door, such as a raised step or threshold. Trim any bushes or trees on the path to your home. Use bright outdoor lighting. Clear any walking paths of anything that might make someone trip, such as rocks or tools. Regularly check to see if handrails are loose or broken. Make sure that both sides of any steps have handrails. Any raised decks and porches should have guardrails on the edges. Have any leaves, snow, or ice cleared regularly. Use sand or salt on walking paths during winter. Clean up any spills in your garage right away. This includes oil or grease spills. What can I do in the bathroom? Use night lights. Install grab bars by the toilet and in the tub and shower. Do not use towel bars as grab bars. Use non-skid mats or decals in the tub or shower. If you need to sit down in the shower, use a plastic, non-slip stool. Keep the floor dry. Clean up any water that spills on the floor as soon as it happens. Remove soap buildup in the tub or shower regularly. Attach bath mats securely with double-sided non-slip rug tape. Do not have throw rugs and other things on the floor that can make you trip. What can I do in the bedroom? Use night lights. Make sure that you have a  light by your bed that is easy to reach. Do not use any sheets or blankets that are too big for your bed. They should not hang down onto the floor. Have a firm chair that has side arms. You can use this for support while you get dressed. Do not have throw rugs and other things on the floor that can make you trip. What can I do in the kitchen? Clean up any spills right away. Avoid walking on wet floors. Keep items that you use a lot in easy-to-reach places. If you need to reach something above you, use a strong step stool that has a grab bar. Keep electrical cords out of the way. Do not use floor polish or wax that makes floors slippery. If you must use wax, use non-skid floor wax. Do not have throw rugs and other things on the floor that can make you trip. What can I do with my stairs? Do not leave any items on the stairs. Make sure that there are handrails on both sides of the stairs and use them. Fix handrails that are broken or loose. Make sure that handrails are as long as the stairways. Check any carpeting to make sure that it is firmly attached to the stairs. Fix any carpet that is loose or worn. Avoid having throw rugs at the top or bottom of the stairs. If  you do have throw rugs, attach them to the floor with carpet tape. Make sure that you have a light switch at the top of the stairs and the bottom of the stairs. If you do not have them, ask someone to add them for you. What else can I do to help prevent falls? Wear shoes that: Do not have high heels. Have rubber bottoms. Are comfortable and fit you well. Are closed at the toe. Do not wear sandals. If you use a stepladder: Make sure that it is fully opened. Do not climb a closed stepladder. Make sure that both sides of the stepladder are locked into place. Ask someone to hold it for you, if possible. Clearly mark and make sure that you can see: Any grab bars or handrails. First and last steps. Where the edge of each step  is. Use tools that help you move around (mobility aids) if they are needed. These include: Canes. Walkers. Scooters. Crutches. Turn on the lights when you go into a dark area. Replace any light bulbs as soon as they burn out. Set up your furniture so you have a clear path. Avoid moving your furniture around. If any of your floors are uneven, fix them. If there are any pets around you, be aware of where they are. Review your medicines with your doctor. Some medicines can make you feel dizzy. This can increase your chance of falling. Ask your doctor what other things that you can do to help prevent falls. This information is not intended to replace advice given to you by your health care provider. Make sure you discuss any questions you have with your health care provider. Document Released: 09/16/2009 Document Revised: 04/27/2016 Document Reviewed: 12/25/2014 Elsevier Interactive Patient Education  2017 [employer].

## 2021-05-17 NOTE — Progress Notes (Signed)
Subjective:   Susan [name] is a 87 y.o. female who presents for Medicare Annual (Subsequent) preventive examination.  I connected with  Susan [name] on 05/17/21 by a telephone enabled telemedicine application and verified that I am speaking with the correct person using two identifiers.   I discussed the limitations of evaluation and management by telemedicine. The patient expressed understanding and agreed to proceed.  Patient location: home  Provider location: tele a health  I provided 30 minutes of non face - to - face time during this encounter.   Review of Systems     na Cardiac Risk Factors include: advanced age (>35men, >42 women);dyslipidemia;hypertension     Objective:    Today's Vitals   05/17/21 1255  PainSc: 4    There is no height or weight on file to calculate BMI.  Advanced Directives 05/17/2021 10/06/2020 05/19/2020 06/26/2019 12/06/2018 12/06/2018 05/16/2017  Does Patient Have a Medical Advance Directive? Yes Yes Yes Yes Yes - Yes  Type of Advance Directive [place];Living will [place];Living will [place] of facility DNR (pink MOST or yellow form) [place];Living will  Does patient want to make changes to medical advance directive? - - No - Patient declined - No - Patient declined - -  Copy of [place] in Chart? Yes - validated most recent copy scanned in chart (See row information) - No - copy requested, Physician notified - - - No - copy requested  Would patient like information on creating a medical advance directive? - - No - Patient declined - - - -  Pre-existing out of facility DNR order (yellow form or pink MOST form) - - - - Yellow form placed in chart (order not valid for inpatient use) (No Data) -    Current Medications (verified) Outpatient Encounter Medications as of 05/17/2021  Medication Sig    acetaminophen (TYLENOL) 650 MG CR tablet Take 650 mg by mouth every 8 (eight) hours as needed for pain.   ALPRAZolam (XANAX) 0.5 MG tablet Take 1 tablet (0.5 mg total) by mouth daily as needed for anxiety.   amLODipine (NORVASC) 5 MG tablet Take 2 tablets (10 mg total) by mouth at bedtime.   aspirin EC 325 MG tablet Take 325 mg by mouth daily.   cholestyramine (QUESTRAN) 4 g packet Take 1 packet (4 g total) by mouth 2 (two) times daily.   dicyclomine (BENTYL) 10 MG capsule Take 1 capsule (10 mg total) by mouth 2 (two) times daily.   diphenoxylate-atropine (LOMOTIL) 2.5-0.025 MG tablet Take 1 tablet by mouth 2 (two) times daily as needed for diarrhea or loose stools.   famotidine (PEPCID) 20 MG tablet Take 1 tablet (20 mg total) by mouth daily.   fexofenadine (ALLEGRA) 180 MG tablet Take 180 mg by mouth daily.   furosemide (LASIX) 20 MG tablet Take 1 tablet (20 mg total) by mouth daily.   IRON PO Take 65 mg by mouth 3 (three) times a week.   losartan (COZAAR) 50 MG tablet Take 2 tablets (100 mg total) by mouth daily.   metoprolol succinate (TOPROL-XL) 25 MG 24 hr tablet Take 1 tablet (25 mg total) by mouth daily.   potassium chloride SA (KLOR-CON) 20 MEQ tablet Take 1 tablet (20 mEq total) by mouth daily.   Vitamin D, Ergocalciferol, (DRISDOL) 1.25 MG (50000 UNIT) CAPS capsule Take 1 capsule by mouth once a week  meclizine (ANTIVERT) 25 MG tablet Take 1 tablet (25 mg total) by mouth 3 (three) times daily as needed for dizziness. (Patient not taking: Reported on 05/17/2021)   No facility-administered encounter medications on file as of 05/17/2021.    Allergies (verified) Butrans [buprenorphine], Codeine, and Morphine and related   History: Past Medical History:  Diagnosis Date   Actinic keratosis, hx of    chest wall (2012)   Allergic rhinitis    Anxiety    Arthritis    "spine, hips" (01/30/2017)   Bursitis    Cellulitis of right foot 01/30/2017   Colon polyps    Diverticulosis     Esophageal dysmotilities 10/16/2017   Fibromyalgia    "qwhere" (01/30/2017)   Gallstones    GERD (gastroesophageal reflux disease)    High cholesterol    History of kidney stones    History of stomach ulcers    Hx of squamous cell carcinoma excision    right mandible (2011), right arm (2014)   Hypertension    IBS (irritable bowel syndrome)    Osteoarthritis    Pneumonia 2000s X 2   "twice" (01/30/2017)   Pyloric stenosis    Stroke ([place]) 10/2014   denies residual on 01/30/2017   Past Surgical History:  Procedure Laterality Date   APPENDECTOMY     CARDIOVASCULAR STRESS TEST  04/2016   nuclear cardiolite stress test done in [place] (no ischemia, no LVH, no LV systolic function)   CATARACT EXTRACTION W/ INTRAOCULAR LENS  IMPLANT, BILATERAL Bilateral    CHOLECYSTECTOMY OPEN     COLONOSCOPY  2009, 2014   DIAGNOSTIC MAMMOGRAM  2009, 2012   TONSILLECTOMY     Family History  Problem Relation Age of Onset   Stroke Mother    Heart attack Mother    Stroke Father    Heart attack Father    Heart attack Maternal Grandmother    Stroke Maternal Grandmother    Stroke Paternal Grandmother    Heart attack Paternal Grandmother    Heart attack Maternal Grandfather    Stroke Maternal Grandfather    Stroke Paternal Grandfather    Heart attack Paternal Grandfather    Colon cancer Neg Hx    Pancreatic cancer Neg Hx    Stomach cancer Neg Hx    Esophageal cancer Neg Hx    Social History   Socioeconomic History   Marital status: Widowed    Spouse name: Not on file   Number of children: 4   Years of education: Not on file   Highest education level: Not on file  Occupational History   Occupation: retired  Tobacco Use   Smoking status: Never   Smokeless tobacco: Never  Vaping Use   Vaping Use: Never used  Substance and Sexual Activity   Alcohol use: Yes    Comment: glass of red wine maybe once week   Drug use: No   Sexual activity: Not on file  Other Topics Concern   Not on file   Social History Narrative   Not on file   Social Determinants of Health   Financial Resource Strain: Low Risk    Difficulty of Paying Living Expenses: Not hard at all  Food Insecurity: No Food Insecurity   Worried About Charity fundraiser in the Last Year: Never true   [place] in the Last Year: Never true  Transportation Needs: Unknown   Lack of Transportation (Medical): Not on file   Lack of Transportation (Non-Medical): No  Physical  Activity: Insufficiently Active   Days of Exercise per Week: 3 days   Minutes of Exercise per Session: 20 min  Stress: No Stress Concern Present   Feeling of Stress : Not at all  Social Connections: Moderately Isolated   Frequency of Communication with Friends and Family: Three times a week   Frequency of Social Gatherings with Friends and Family: Twice a week   Attends Religious Services: 1 to 4 times per year   Active Member of [employer] or Organizations: No   Attends Archivist Meetings: Never   Marital Status: Widowed    Tobacco Counseling Counseling given: Not Answered   Clinical Intake:  Pre-visit preparation completed: Yes  Pain : 0-10 Pain Score: 4  Pain Type: Chronic pain Pain Location: Back Pain Descriptors / Indicators: Aching Pain Onset: More than a month ago Pain Relieving Factors: tylenol/ has had epidural Effect of Pain on Daily Activities: yes  Pain Relieving Factors: tylenol/ has had epidural  Nutritional Risks: None Diabetes: No  How often do you need to have someone help you when you read instructions, pamphlets, or other written materials from your doctor or pharmacy?: 1 - Never  Diabetic?  NO  Interpreter Needed?: No  Information entered by :: Leroy Kennedy LPN   Activities of Daily Living In your present state of health, do you have any difficulty performing the following activities: 05/17/2021  Hearing? Y  Vision? N  Difficulty concentrating or making decisions? Y  Comment has trouble  remember names  Walking or climbing stairs? Y  Comment has a walker  Dressing or bathing? N  Doing errands, shopping? N  Preparing Food and eating ? Y  Using the Toilet? N  In the past six months, have you accidently leaked urine? N  Do you have problems with loss of bowel control? Y  Managing your Medications? N  Managing your Finances? Y  Comment daughter takes care of finances  Housekeeping or managing your Housekeeping? Y  Comment a housekeeper comes in [place] two weeks  Some recent data might be hidden    Patient Care Team: Nche, Charlene Brooke, NP as PCP - General (Internal Medicine)  Indicate any recent Medical Services you may have received from other than [facility] providers in the past year (date may be approximate).     Assessment:   This is a routine wellness examination for Susan [name].  Hearing/Vision screen Hearing Screening - Comments:: Has hearing aids both ears Vision Screening - Comments:: Due for Eye exam No problem with vision  will Call Dr. Satira Sark  Dietary issues and exercise activities discussed: Current Exercise Habits: Home exercise routine, Type of exercise: strength training/weights;walking, Time (Minutes): 20, Frequency (Times/Week): 3, Weekly Exercise (Minutes/Week): 60, Intensity: Mild   Goals Addressed             This Visit's Progress    To be as active and as independent as possible   Not on track      Depression Screen PHQ 2/9 Scores 05/17/2021 06/02/2020 06/02/2020 02/19/2019 11/06/2018 05/01/2018 09/07/2017  PHQ - 2 Score 0 0 0 0 0 0 0  PHQ- 9 Score - 11 - - - - -    Fall Risk Fall Risk  05/17/2021 03/28/2021 09/13/2020 06/02/2020 04/08/2020  Falls in the past year? 0 0 0 0 0  Number falls in past yr: 0 0 0 0 -  Injury with Fall? 0 0 0 0 -  Risk for fall due to : - No Fall Risks Impaired  balance/gait - -  Follow up Falls evaluation completed;Falls prevention discussed Falls evaluation completed Falls evaluation completed;Education provided;Follow up  appointment - -    FALL RISK PREVENTION PERTAINING TO THE HOME:  Any stairs in or around the home? Yes  If so, are there any without handrails? No  Home free of loose throw rugs in walkways, pet beds, electrical cords, etc? Yes  Adequate lighting in your home to reduce risk of falls? Yes   ASSISTIVE DEVICES UTILIZED TO PREVENT FALLS:  Life alert? Yes  Use of a cane, walker or w/c? Yes  Grab bars in the bathroom? Yes  Shower chair or bench in shower? Yes  Elevated toilet seat or a handicapped toilet? Yes   TIMED UP AND GO:  Was the test performed? No .     Tele health   Cognitive Function: MMSE - Mini Mental State Exam 05/16/2017  Orientation to time 5  Orientation to Place 5  Registration 3  Attention/ Calculation 5  Recall 2  Language- name 2 objects 2  Language- repeat 1  Language- follow 3 step command 3  Language- read & follow direction 1  Write a sentence 1  Copy design 1  Total score 29     6CIT Screen 05/17/2021  What Year? 0 points  What month? 0 points  What time? 0 points  Count back from 20 2 points  Months in reverse 0 points  Repeat phrase 0 points  Total Score 2    Immunizations Immunization History  Administered Date(s) Administered   Fluad Quad(high Dose 65+) 08/15/2019, 09/03/2020   Influenza, High Dose Seasonal PF 09/07/2017, 09/11/2018   Moderna Sars-Covid-2 Vaccination 12/20/2019, 01/20/2020   Pneumococcal Conjugate-13 05/16/2017    TDAP status: Due, Education has been provided regarding the importance of this vaccine. Advised may receive this vaccine at local pharmacy or Health Dept. Aware to provide a copy of the vaccination record if obtained from local pharmacy or Health Dept. Verbalized acceptance and understanding.  Flu Vaccine status: Up to date  Pneumococcal vaccine status: Due, Education has been provided regarding the importance of this vaccine. Advised may receive this vaccine at local pharmacy or Health Dept. Aware to  provide a copy of the vaccination record if obtained from local pharmacy or Health Dept. Verbalized acceptance and understanding.  Covid-19 vaccine status: Information provided on how to obtain vaccines.   Qualifies for Shingles Vaccine? Yes   Zostavax completed No   Shingrix Completed?: No.    Education has been provided regarding the importance of this vaccine. Patient has been advised to call insurance company to determine out of pocket expense if they have not yet received this vaccine. Advised may also receive vaccine at local pharmacy or Health Dept. Verbalized acceptance and understanding.  Screening Tests Health Maintenance  Topic Date Due   Zoster Vaccines- Shingrix (1 of 2) Never done   COVID-19 Vaccine (3 - Booster for Moderna series) 06/18/2020   TETANUS/TDAP  09/13/2021 (Originally 11/26/1943)   INFLUENZA VACCINE  07/04/2021   DEXA SCAN  Completed   PNA vac Low Risk Adult  Completed   HPV VACCINES  Aged Out    Health Maintenance  Health Maintenance Due  Topic Date Due   Zoster Vaccines- Shingrix (1 of 2) Never done   COVID-19 Vaccine (3 - Booster for Moderna series) 06/18/2020    Colorectal cancer screening: No longer required.   Mammogram status: No longer required due to age.   Lung Cancer Screening: (Low Dose CT  Chest recommended if Age 7-80 years, 30 pack-year currently smoking OR have quit w/in 15years.) does not qualify.   Lung Cancer Screening Referral: na  Additional Screening:  Hepatitis C Screening: does not qualify; Completed 08-15-2019  Vision Screening: Recommended annual ophthalmology exams for early detection of glaucoma and other disorders of the eye. Is the patient up to date with their annual eye exam?  No  Who is the provider or what is the name of the office in which the patient attends annual eye exams? Dr Satira Sark If pt is not established with a provider, would they like to be referred to a provider to establish care?  established .    Dental Screening: Recommended annual dental exams for proper oral hygiene  Community Resource Referral / Chronic Care Management: CRR required this visit?  No   CCM required this visit?  No      Plan:     I have personally reviewed and noted the following in the patient's chart:   Medical and social history Use of alcohol, tobacco or illicit drugs  Current medications and supplements including opioid prescriptions.  Functional ability and status Nutritional status Physical activity Advanced directives List of other physicians Hospitalizations, surgeries, and ER visits in previous 12 months Vitals Screenings to include cognitive, depression, and falls Referrals and appointments  In addition, I have reviewed and discussed with patient certain preventive protocols, quality metrics, and best practice recommendations. A written personalized care plan for preventive services as well as general preventive health recommendations were provided to patient.     Leroy Kennedy, LPN   [id number]   Nurse Notes: na

## 2021-05-23 DIAGNOSIS — Z20828 Contact with and (suspected) exposure to other viral communicable diseases: Secondary | ICD-10-CM | POA: Diagnosis not present

## 2021-05-25 ENCOUNTER — Other Ambulatory Visit: Payer: Self-pay | Admitting: Family Medicine

## 2021-05-26 DIAGNOSIS — Z20828 Contact with and (suspected) exposure to other viral communicable diseases: Secondary | ICD-10-CM | POA: Diagnosis not present

## 2021-05-30 DIAGNOSIS — Z20828 Contact with and (suspected) exposure to other viral communicable diseases: Secondary | ICD-10-CM | POA: Diagnosis not present

## 2021-05-30 NOTE — Progress Notes (Deleted)
[place] Phone: [phone] Subjective:    I'm seeing this patient by the request  of:  Nche, Charlene Brooke, NP  CC:   [id number]  Susan [name] is a 87 y.o. female coming in with complaint of back pain. Patient had epidural on 04/27/2021. Patient states   Onset-  Location Duration-  Character- Aggravating factors- Reliving factors-  Therapies tried-  Severity-     Past Medical History:  Diagnosis Date   Actinic keratosis, hx of    chest wall (2012)   Allergic rhinitis    Anxiety    Arthritis    "spine, hips" (01/30/2017)   Bursitis    Cellulitis of right foot 01/30/2017   Colon polyps    Diverticulosis    Esophageal dysmotilities 10/16/2017   Fibromyalgia    "qwhere" (01/30/2017)   Gallstones    GERD (gastroesophageal reflux disease)    High cholesterol    History of kidney stones    History of stomach ulcers    Hx of squamous cell carcinoma excision    right mandible (2011), right arm (2014)   Hypertension    IBS (irritable bowel syndrome)    Osteoarthritis    Pneumonia 2000s X 2   "twice" (01/30/2017)   Pyloric stenosis    Stroke ([place]) 10/2014   denies residual on 01/30/2017   Past Surgical History:  Procedure Laterality Date   APPENDECTOMY     CARDIOVASCULAR STRESS TEST  04/2016   nuclear cardiolite stress test done in [place] (no ischemia, no LVH, no LV systolic function)   CATARACT EXTRACTION W/ INTRAOCULAR LENS  IMPLANT, BILATERAL Bilateral    CHOLECYSTECTOMY OPEN     COLONOSCOPY  2009, 2014   DIAGNOSTIC MAMMOGRAM  2009, 2012   TONSILLECTOMY     Social History   Socioeconomic History   Marital status: Widowed    Spouse name: Not on file   Number of children: 4   Years of education: Not on file   Highest education level: Not on file  Occupational History   Occupation: retired  Tobacco Use   Smoking status: Never   Smokeless tobacco: Never  Vaping Use   Vaping  Use: Never used  Substance and Sexual Activity   Alcohol use: Yes    Comment: glass of red wine maybe once week   Drug use: No   Sexual activity: Not on file  Other Topics Concern   Not on file  Social History Narrative   Not on file   Social Determinants of Health   Financial Resource Strain: Low Risk    Difficulty of Paying Living Expenses: Not hard at all  Food Insecurity: No Food Insecurity   Worried About Charity fundraiser in the Last Year: Never true   [place] in the Last Year: Never true  Transportation Needs: Unknown   Lack of Transportation (Medical): Not on file   Lack of Transportation (Non-Medical): No  Physical Activity: Insufficiently Active   Days of Exercise per Week: 3 days   Minutes of Exercise per Session: 20 min  Stress: No Stress Concern Present   Feeling of Stress : Not at all  Social Connections: Moderately Isolated   Frequency of Communication with Friends and Family: Three times a week   Frequency of Social Gatherings with Friends and Family: Twice a week   Attends Religious Services: 1 to 4 times per year   Active Member of Clubs or  Organizations: No   Attends Archivist Meetings: Never   Marital Status: Widowed   Allergies  Allergen Reactions   Butrans [Buprenorphine] Other (See Comments)    Severe chest pains   Codeine Other (See Comments)    Severe Chest pains   Morphine And Related Other (See Comments)    Severe chest pains   Family History  Problem Relation Age of Onset   Stroke Mother    Heart attack Mother    Stroke Father    Heart attack Father    Heart attack Maternal Grandmother    Stroke Maternal Grandmother    Stroke Paternal Grandmother    Heart attack Paternal Grandmother    Heart attack Maternal Grandfather    Stroke Maternal Grandfather    Stroke Paternal Grandfather    Heart attack Paternal Grandfather    Colon cancer Neg Hx    Pancreatic cancer Neg Hx    Stomach cancer Neg Hx    Esophageal  cancer Neg Hx      Current Outpatient Medications (Cardiovascular):    amLODipine (NORVASC) 5 MG tablet, Take 2 tablets (10 mg total) by mouth at bedtime.   cholestyramine (QUESTRAN) 4 g packet, Take 1 packet (4 g total) by mouth 2 (two) times daily.   furosemide (LASIX) 20 MG tablet, Take 1 tablet (20 mg total) by mouth daily.   losartan (COZAAR) 50 MG tablet, Take 2 tablets (100 mg total) by mouth daily.   metoprolol succinate (TOPROL-XL) 25 MG 24 hr tablet, Take 1 tablet (25 mg total) by mouth daily.  Current Outpatient Medications (Respiratory):    fexofenadine (ALLEGRA) 180 MG tablet, Take 180 mg by mouth daily.  Current Outpatient Medications (Analgesics):    acetaminophen (TYLENOL) 650 MG CR tablet, Take 650 mg by mouth every 8 (eight) hours as needed for pain.   aspirin EC 325 MG tablet, Take 325 mg by mouth daily.  Current Outpatient Medications (Hematological):    IRON PO, Take 65 mg by mouth 3 (three) times a week.  Current Outpatient Medications (Other):    ALPRAZolam (XANAX) 0.5 MG tablet, Take 1 tablet (0.5 mg total) by mouth daily as needed for anxiety.   dicyclomine (BENTYL) 10 MG capsule, Take 1 capsule (10 mg total) by mouth 2 (two) times daily.   diphenoxylate-atropine (LOMOTIL) 2.5-0.025 MG tablet, Take 1 tablet by mouth 2 (two) times daily as needed for diarrhea or loose stools.   famotidine (PEPCID) 20 MG tablet, Take 1 tablet (20 mg total) by mouth daily.   meclizine (ANTIVERT) 25 MG tablet, Take 1 tablet (25 mg total) by mouth 3 (three) times daily as needed for dizziness. (Patient not taking: Reported on 05/17/2021)   potassium chloride SA (KLOR-CON) 20 MEQ tablet, Take 1 tablet (20 mEq total) by mouth daily.   Vitamin D, Ergocalciferol, (DRISDOL) 1.25 MG (50000 UNIT) CAPS capsule, Take 1 capsule by mouth once a week   Reviewed prior external information including notes and imaging from  primary care provider As well as notes that were available from care  everywhere and other healthcare systems.  Past medical history, social, surgical and family history all reviewed in electronic medical record.  No pertanent information unless stated regarding to the chief complaint.   Review of Systems:  No headache, visual changes, nausea, vomiting, diarrhea, constipation, dizziness, abdominal pain, skin rash, fevers, chills, night sweats, weight loss, swollen lymph nodes, body aches, joint swelling, chest pain, shortness of breath, mood changes. POSITIVE muscle aches  Objective  There were no vitals taken for this visit.   General: No apparent distress alert and oriented x3 mood and affect normal, dressed appropriately.  HEENT: Pupils equal, extraocular movements intact  Respiratory: Patient's speak in full sentences and does not appear short of breath  Cardiovascular: No lower extremity edema, non tender, no erythema  Gait normal with good balance and coordination.  MSK:  Non tender with full range of motion and good stability and symmetric strength and tone of shoulders, elbows, wrist, hip, knee and ankles bilaterally.     Impression and Recommendations:     The above documentation has been reviewed and is accurate and complete [name]

## 2021-05-31 ENCOUNTER — Ambulatory Visit: Payer: Medicare Other | Admitting: Family Medicine

## 2021-06-01 ENCOUNTER — Other Ambulatory Visit: Payer: Self-pay | Admitting: Internal Medicine

## 2021-06-01 NOTE — Progress Notes (Signed)
[place] [address] [place] Phone: [phone] Subjective:   I [name] am serving as a Education administrator for Dr. Hulan Saas.  This visit occurred during the SARS-CoV-2 public health emergency.  Safety protocols were in place, including screening questions prior to the visit, additional usage of staff PPE, and extensive cleaning of exam room while observing appropriate contact time as indicated for disinfecting solutions.   I'm seeing this patient by the request  of:  Nche, Charlene Brooke, NP  CC: Low back pain follow-up  [id number]  03/09/2021 Patient has had some atrophy of the hips bilaterally and we will continue to monitor.  Worsening symptoms we could consider the injection just from palliative good concern with atrophy noted.  Patient noted feels like those injections were helping with some of her fibromyalgia pain as well.  Lumbar radiculopathy.  Chronic problem with exacerbation.  Toradol and Depo-Medrol given again today.  Hopefully this will be beneficial.  We will repeat to the epidural and hopefully will get a longer duration of improvement.  Last injection was in January.  Patient will follow up with me 4 to 6 weeks after the injection to see how patient responds.  Update 06/02/2021 Susan [name] is a 87 y.o. female coming in with complaint of back and hip pain. Epidural 04/27/2021. Patient states the epidural helped out and states that it didn't last as long as she hoped. Its been 5 weeks since last epidural.  Patient states that it is still better than what it was previously.  Making some progress overall.  Patient is accompanied with daughter who states that he she seems to be doing relatively well at the moment.  They do want to know how frequent she can have the injections.  Patient does state that she did have 3 weeks of nearly being pain-free.      Past Medical History:  Diagnosis Date   Actinic keratosis, hx of     chest wall (2012)   Allergic rhinitis    Anxiety    Arthritis    "spine, hips" (01/30/2017)   Bursitis    Cellulitis of right foot 01/30/2017   Colon polyps    Diverticulosis    Esophageal dysmotilities 10/16/2017   Fibromyalgia    "qwhere" (01/30/2017)   Gallstones    GERD (gastroesophageal reflux disease)    High cholesterol    History of kidney stones    History of stomach ulcers    Hx of squamous cell carcinoma excision    right mandible (2011), right arm (2014)   Hypertension    IBS (irritable bowel syndrome)    Osteoarthritis    Pneumonia 2000s X 2   "twice" (01/30/2017)   Pyloric stenosis    Stroke ([place]) 10/2014   denies residual on 01/30/2017   Past Surgical History:  Procedure Laterality Date   APPENDECTOMY     CARDIOVASCULAR STRESS TEST  04/2016   nuclear cardiolite stress test done in [place] (no ischemia, no LVH, no LV systolic function)   CATARACT EXTRACTION W/ INTRAOCULAR LENS  IMPLANT, BILATERAL Bilateral    CHOLECYSTECTOMY OPEN     COLONOSCOPY  2009, 2014   DIAGNOSTIC MAMMOGRAM  2009, 2012   TONSILLECTOMY     Social History   Socioeconomic History   Marital status: Widowed    Spouse name: Not on file   Number of children: 4   Years of education: Not on file   Highest education level: Not on file  Occupational History   Occupation: retired  Tobacco Use   Smoking status: Never   Smokeless tobacco: Never  Vaping Use   Vaping Use: Never used  Substance and Sexual Activity   Alcohol use: Yes    Comment: glass of red wine maybe once week   Drug use: No   Sexual activity: Not on file  Other Topics Concern   Not on file  Social History Narrative   Not on file   Social Determinants of Health   Financial Resource Strain: Low Risk    Difficulty of Paying Living Expenses: Not hard at all  Food Insecurity: No Food Insecurity   Worried About Charity fundraiser in the Last Year: Never true   [place] in the Last Year: Never true   Transportation Needs: Unknown   Lack of Transportation (Medical): Not on file   Lack of Transportation (Non-Medical): No  Physical Activity: Insufficiently Active   Days of Exercise per Week: 3 days   Minutes of Exercise per Session: 20 min  Stress: No Stress Concern Present   Feeling of Stress : Not at all  Social Connections: Moderately Isolated   Frequency of Communication with Friends and Family: Three times a week   Frequency of Social Gatherings with Friends and Family: Twice a week   Attends Religious Services: 1 to 4 times per year   Active Member of [employer] or Organizations: No   Attends Archivist Meetings: Never   Marital Status: Widowed   Allergies  Allergen Reactions   Butrans [Buprenorphine] Other (See Comments)    Severe chest pains   Codeine Other (See Comments)    Severe Chest pains   Morphine And Related Other (See Comments)    Severe chest pains   Family History  Problem Relation Age of Onset   Stroke Mother    Heart attack Mother    Stroke Father    Heart attack Father    Heart attack Maternal Grandmother    Stroke Maternal Grandmother    Stroke Paternal Grandmother    Heart attack Paternal Grandmother    Heart attack Maternal Grandfather    Stroke Maternal Grandfather    Stroke Paternal Grandfather    Heart attack Paternal Grandfather    Colon cancer Neg Hx    Pancreatic cancer Neg Hx    Stomach cancer Neg Hx    Esophageal cancer Neg Hx      Current Outpatient Medications (Cardiovascular):    amLODipine (NORVASC) 5 MG tablet, Take 2 tablets (10 mg total) by mouth at bedtime.   cholestyramine (QUESTRAN) 4 g packet, Take 1 packet (4 g total) by mouth 2 (two) times daily.   furosemide (LASIX) 20 MG tablet, Take 1 tablet (20 mg total) by mouth daily.   losartan (COZAAR) 50 MG tablet, Take 2 tablets (100 mg total) by mouth daily.   metoprolol succinate (TOPROL-XL) 25 MG 24 hr tablet, Take 1 tablet (25 mg total) by mouth daily.  Current  Outpatient Medications (Respiratory):    fexofenadine (ALLEGRA) 180 MG tablet, Take 180 mg by mouth daily.  Current Outpatient Medications (Analgesics):    acetaminophen (TYLENOL) 650 MG CR tablet, Take 650 mg by mouth every 8 (eight) hours as needed for pain.   aspirin EC 325 MG tablet, Take 325 mg by mouth daily.  Current Outpatient Medications (Hematological):    IRON PO, Take 65 mg by mouth 3 (three) times a week.  Current Outpatient Medications (Other):  ALPRAZolam (XANAX) 0.5 MG tablet, Take 1 tablet (0.5 mg total) by mouth daily as needed for anxiety.   dicyclomine (BENTYL) 10 MG capsule, Take 1 capsule (10 mg total) by mouth 2 (two) times daily.   diphenoxylate-atropine (LOMOTIL) 2.5-0.025 MG tablet, TAKE 1 TABLET BY MOUTH TWICE DAILY AS NEEDED FOR DIARRHEA OR LOOSE STOOLD   famotidine (PEPCID) 20 MG tablet, Take 1 tablet (20 mg total) by mouth daily.   meclizine (ANTIVERT) 25 MG tablet, Take 1 tablet (25 mg total) by mouth 3 (three) times daily as needed for dizziness.   potassium chloride SA (KLOR-CON) 20 MEQ tablet, Take 1 tablet (20 mEq total) by mouth daily.   Vitamin D, Ergocalciferol, (DRISDOL) 1.25 MG (50000 UNIT) CAPS capsule, Take 1 capsule by mouth once a week   Reviewed prior external information including notes and imaging from  primary care provider As well as notes that were available from care everywhere and other healthcare systems.  Past medical history, social, surgical and family history all reviewed in electronic medical record.  No pertanent information unless stated regarding to the chief complaint.   Review of Systems:  No headache, visual changes, nausea, vomiting, diarrhea, constipation, dizziness, abdominal pain, skin rash, fevers, chills, night sweats, weight loss, swollen lymph nodes, joint swelling, chest pain, shortness of breath, mood changes. POSITIVE muscle aches, body aches  Objective  Blood pressure 140/70, pulse 67, height 5\' 1"  (1.549 m),  weight 103 lb (46.7 kg), SpO2 98 %.   General: No apparent distress alert and oriented x3 mood and affect normal, dressed appropriately.  HEENT: Pupils equal, extraocular movements intact  Respiratory: Patient's speak in full sentences and does not appear short of breath  Cardiovascular: No lower extremity edema, non tender, no erythema skin no does have a lot of of senile- purpura Gait ambulates with the aid of a walker. Patient back exam still has pain out of proportion to the amount of palpation.  Patient does have underlying fibromyalgia.  Tenderness to palpation noted.  Tightness noted with straight leg test.  Tenderness still on the lateral aspects of the hips bilaterally.   Impression and Recommendations:     The above documentation has been reviewed and is accurate and complete Lyndal Pulley, DO

## 2021-06-02 ENCOUNTER — Ambulatory Visit (INDEPENDENT_AMBULATORY_CARE_PROVIDER_SITE_OTHER): Payer: Medicare Other | Admitting: Family Medicine

## 2021-06-02 ENCOUNTER — Other Ambulatory Visit: Payer: Self-pay

## 2021-06-02 ENCOUNTER — Encounter: Payer: Self-pay | Admitting: Family Medicine

## 2021-06-02 VITALS — BP 140/70 | HR 67 | Ht 61.0 in | Wt 103.0 lb

## 2021-06-02 DIAGNOSIS — M5416 Radiculopathy, lumbar region: Secondary | ICD-10-CM | POA: Diagnosis not present

## 2021-06-02 DIAGNOSIS — G8929 Other chronic pain: Secondary | ICD-10-CM | POA: Diagnosis not present

## 2021-06-02 DIAGNOSIS — M545 Low back pain, unspecified: Secondary | ICD-10-CM

## 2021-06-02 DIAGNOSIS — Z20828 Contact with and (suspected) exposure to other viral communicable diseases: Secondary | ICD-10-CM | POA: Diagnosis not present

## 2021-06-02 NOTE — Assessment & Plan Note (Signed)
Patient has responded fairly well though to the epidural.  Patient states that this is the most improvement she has had in quite some time.  Discussed with patient icing regimen and home exercises.  Discussed with patient about potentially repeating the epidural again or doing it every 45-month schedule.  Patient as well as daughter asked many good questions which were answered today.  At this point we will order another epidural but patient may put it off for another 2 months to see how patient does.  Discussed with patient otherwise secondary to her fall risk would not like to add any medications that can give her more difficulty.  Patient as well as her daughter do agree with this.  Follow-up with me again 2 months

## 2021-06-02 NOTE — Patient Instructions (Addendum)
Good to see you Ask Ria Comment about billing Epidural ordered schedule for the end of August See me again in 4-6 weeks after epidural

## 2021-06-07 DIAGNOSIS — Z20828 Contact with and (suspected) exposure to other viral communicable diseases: Secondary | ICD-10-CM | POA: Diagnosis not present

## 2021-06-13 DIAGNOSIS — Z20828 Contact with and (suspected) exposure to other viral communicable diseases: Secondary | ICD-10-CM | POA: Diagnosis not present

## 2021-06-23 DIAGNOSIS — Z20828 Contact with and (suspected) exposure to other viral communicable diseases: Secondary | ICD-10-CM | POA: Diagnosis not present

## 2021-06-24 ENCOUNTER — Other Ambulatory Visit: Payer: Self-pay

## 2021-06-27 ENCOUNTER — Encounter: Payer: Self-pay | Admitting: Nurse Practitioner

## 2021-06-27 ENCOUNTER — Ambulatory Visit (INDEPENDENT_AMBULATORY_CARE_PROVIDER_SITE_OTHER): Payer: Medicare Other | Admitting: Nurse Practitioner

## 2021-06-27 ENCOUNTER — Other Ambulatory Visit: Payer: Self-pay

## 2021-06-27 DIAGNOSIS — I10 Essential (primary) hypertension: Secondary | ICD-10-CM

## 2021-06-27 DIAGNOSIS — I872 Venous insufficiency (chronic) (peripheral): Secondary | ICD-10-CM | POA: Diagnosis not present

## 2021-06-27 DIAGNOSIS — Z20828 Contact with and (suspected) exposure to other viral communicable diseases: Secondary | ICD-10-CM | POA: Diagnosis not present

## 2021-06-27 MED ORDER — LOSARTAN POTASSIUM 100 MG PO TABS: 100.0000 mg | ORAL_TABLET | Freq: Every day | ORAL | 3 refills | Status: DC

## 2021-06-27 MED ORDER — AMLODIPINE BESYLATE 10 MG PO TABS: 10.0000 mg | ORAL_TABLET | Freq: Every day | ORAL | 3 refills | Status: DC

## 2021-06-27 NOTE — Assessment & Plan Note (Signed)
Improved BP with furosemide, amlodipine and losartan. BP Readings from Last 3 Encounters:  06/27/21 122/60  06/02/21 140/70  04/27/21 (!) 141/68   Continue current medication

## 2021-06-27 NOTE — Patient Instructions (Signed)
Maintain current medications  

## 2021-06-27 NOTE — Assessment & Plan Note (Addendum)
waxing and waning with furosemide '20mg'$  daily. She notes significant improvement when legs are elevated BP Readings from Last 3 Encounters:  06/27/21 122/60  06/02/21 140/70  04/27/21 (!) 141/68   Continue current rx

## 2021-06-27 NOTE — Progress Notes (Signed)
Subjective:  Patient ID: Susan [name], female    DOB: 01-Apr-1924  Age: 87 y.o. MRN: [id number]  CC: Follow-up (3 mo f/u HTN at home bp ranges 111/67 - 130/80. Med refills amlodipine and losartan)  HPI  Venous insufficiency of both lower extremities Some improvement with furosemide '20mg'$  daily. BP Readings from Last 3 Encounters:  06/27/21 122/60  06/02/21 140/70  04/27/21 (!) 141/68   Continue current rx  Essential hypertension Improved BP with furosemide, amlodipine and losartan. BP Readings from Last 3 Encounters:  06/27/21 122/60  06/02/21 140/70  04/27/21 (!) 141/68   Continue current medication  Wt Readings from Last 3 Encounters:  06/27/21 105 lb 12.8 oz (48 kg)  06/02/21 103 lb (46.7 kg)  04/18/21 103 lb (46.7 kg)     BP Readings from Last 3 Encounters:  06/27/21 122/60  06/02/21 140/70  04/27/21 (!) 141/68    Depression screen PHQ 2/9 06/27/2021 05/17/2021 06/02/2020  Decreased Interest 1 0 0  Down, Depressed, Hopeless 0 0 0  PHQ - 2 Score 1 0 0  Altered sleeping 0 - 1  Tired, decreased energy 3 - 3  Change in appetite 3 - 3  Feeling bad or failure about yourself  0 - 0  Trouble concentrating 0 - 1  Moving slowly or fidgety/restless 1 - 3  Suicidal thoughts 0 - 0  PHQ-9 Score 8 - 11  Difficult doing work/chores Somewhat difficult - -   GAD 7 : Generalized Anxiety Score 06/27/2021 06/02/2020  Nervous, Anxious, on Edge 1 1  Control/stop worrying 1 0  Worry too much - different things 1 0  Trouble relaxing 0 0  Restless 0 0  Easily annoyed or irritable 0 0  Afraid - awful might happen 0 0  Total GAD 7 Score 3 1  Anxiety Difficulty Somewhat difficult -   Reviewed past Medical, Social and Family history today.  Outpatient Medications Prior to Visit  Medication Sig Dispense Refill   acetaminophen (TYLENOL) 650 MG CR tablet Take 650 mg by mouth every 8 (eight) hours as needed for pain.     ALPRAZolam (XANAX) 0.5 MG tablet Take 1 tablet (0.5 mg total) by  mouth daily as needed for anxiety. 30 tablet 5   aspirin EC 325 MG tablet Take 325 mg by mouth daily.     cholestyramine (QUESTRAN) 4 g packet Take 1 packet (4 g total) by mouth 2 (two) times daily. 60 each 6   dicyclomine (BENTYL) 10 MG capsule Take 1 capsule (10 mg total) by mouth 2 (two) times daily. 180 capsule 3   diphenoxylate-atropine (LOMOTIL) 2.5-0.025 MG tablet TAKE 1 TABLET BY MOUTH TWICE DAILY AS NEEDED FOR DIARRHEA OR LOOSE STOOLD 60 tablet 2   famotidine (PEPCID) 20 MG tablet Take 1 tablet (20 mg total) by mouth daily. 90 tablet 1   fexofenadine (ALLEGRA) 180 MG tablet Take 180 mg by mouth daily.     furosemide (LASIX) 20 MG tablet Take 1 tablet (20 mg total) by mouth daily. 90 tablet 1   IRON PO Take 65 mg by mouth 3 (three) times a week.     metoprolol succinate (TOPROL-XL) 25 MG 24 hr tablet Take 1 tablet (25 mg total) by mouth daily. 90 tablet 3   potassium chloride SA (KLOR-CON) 20 MEQ tablet Take 1 tablet (20 mEq total) by mouth daily. 90 tablet 1   Vitamin D, Ergocalciferol, (DRISDOL) 1.25 MG (50000 UNIT) CAPS capsule Take 1 capsule by mouth once a week 12  capsule 0   amLODipine (NORVASC) 5 MG tablet Take 2 tablets (10 mg total) by mouth at bedtime. 90 tablet 3   losartan (COZAAR) 50 MG tablet Take 2 tablets (100 mg total) by mouth daily. 180 tablet 3   meclizine (ANTIVERT) 25 MG tablet Take 1 tablet (25 mg total) by mouth 3 (three) times daily as needed for dizziness. (Patient not taking: Reported on 06/27/2021) 30 tablet 0   No facility-administered medications prior to visit.    ROS See HPI  Objective:  BP 122/60 (BP Location: Right Arm, Patient Position: Sitting, Cuff Size: Small)   Pulse 71   Temp 98.1 F (36.7 C) (Temporal)   Wt 105 lb 12.8 oz (48 kg)   SpO2 97%   BMI 19.99 kg/m   Physical Exam Cardiovascular:     Rate and Rhythm: Normal rate and regular rhythm.     Pulses: Normal pulses.     Heart sounds: Normal heart sounds.  Pulmonary:     Effort:  Pulmonary effort is normal.     Breath sounds: Normal breath sounds.  Musculoskeletal:        General: No swelling or tenderness.     Right lower leg: Edema present.     Left lower leg: Edema present.  Skin:    Findings: No erythema.  Neurological:     Mental Status: She is oriented to person, place, and time.    Assessment & Plan:  This visit occurred during the SARS-CoV-2 public health emergency.  Safety protocols were in place, including screening questions prior to the visit, additional usage of staff PPE, and extensive cleaning of exam room while observing appropriate contact time as indicated for disinfecting solutions.   Susan [name] was seen today for follow-up.  Diagnoses and all orders for this visit:  Essential hypertension -     losartan (COZAAR) 100 MG tablet; Take 1 tablet (100 mg total) by mouth daily. -     amLODipine (NORVASC) 10 MG tablet; Take 1 tablet (10 mg total) by mouth at bedtime.  Venous insufficiency of both lower extremities  Problem List Items Addressed This Visit       Cardiovascular and Mediastinum   Essential hypertension    Improved BP with furosemide, amlodipine and losartan. BP Readings from Last 3 Encounters:  06/27/21 122/60  06/02/21 140/70  04/27/21 (!) 141/68   Continue current medication      Relevant Medications   losartan (COZAAR) 100 MG tablet   amLODipine (NORVASC) 10 MG tablet   Venous insufficiency of both lower extremities    Some improvement with furosemide '20mg'$  daily. BP Readings from Last 3 Encounters:  06/27/21 122/60  06/02/21 140/70  04/27/21 (!) 141/68   Continue current rx      Relevant Medications   losartan (COZAAR) 100 MG tablet   amLODipine (NORVASC) 10 MG tablet    Follow-up: Return in about 6 months (around 12/28/2021) for HTN, hyperglycemia, Anxiety (4mns).  CWilfred Lacy NP

## 2021-06-29 ENCOUNTER — Other Ambulatory Visit: Payer: Self-pay | Admitting: Nurse Practitioner

## 2021-06-29 DIAGNOSIS — I872 Venous insufficiency (chronic) (peripheral): Secondary | ICD-10-CM

## 2021-06-29 DIAGNOSIS — I10 Essential (primary) hypertension: Secondary | ICD-10-CM

## 2021-06-30 ENCOUNTER — Other Ambulatory Visit: Payer: Self-pay

## 2021-06-30 ENCOUNTER — Emergency Department (HOSPITAL_COMMUNITY): Payer: Medicare Other

## 2021-06-30 ENCOUNTER — Emergency Department (HOSPITAL_COMMUNITY)
Admission: EM | Admit: 2021-06-30 | Discharge: 2021-06-30 | Disposition: A | Discharge status: Home / Self Care | Payer: Medicare Other | Attending: Emergency Medicine | Admitting: Emergency Medicine

## 2021-06-30 DIAGNOSIS — W06XXXA Fall from bed, initial encounter: Secondary | ICD-10-CM | POA: Diagnosis not present

## 2021-06-30 DIAGNOSIS — U071 COVID-19: Secondary | ICD-10-CM | POA: Diagnosis not present

## 2021-06-30 DIAGNOSIS — R0602 Shortness of breath: Secondary | ICD-10-CM | POA: Diagnosis not present

## 2021-06-30 DIAGNOSIS — Z7982 Long term (current) use of aspirin: Secondary | ICD-10-CM | POA: Diagnosis not present

## 2021-06-30 DIAGNOSIS — R531 Weakness: Secondary | ICD-10-CM

## 2021-06-30 DIAGNOSIS — I10 Essential (primary) hypertension: Secondary | ICD-10-CM | POA: Insufficient documentation

## 2021-06-30 DIAGNOSIS — Z20828 Contact with and (suspected) exposure to other viral communicable diseases: Secondary | ICD-10-CM | POA: Diagnosis not present

## 2021-06-30 DIAGNOSIS — Y92129 Unspecified place in nursing home as the place of occurrence of the external cause: Secondary | ICD-10-CM | POA: Insufficient documentation

## 2021-06-30 DIAGNOSIS — G4489 Other headache syndrome: Secondary | ICD-10-CM | POA: Diagnosis not present

## 2021-06-30 DIAGNOSIS — Z79899 Other long term (current) drug therapy: Secondary | ICD-10-CM | POA: Insufficient documentation

## 2021-06-30 DIAGNOSIS — R0600 Dyspnea, unspecified: Secondary | ICD-10-CM | POA: Diagnosis not present

## 2021-06-30 DIAGNOSIS — R509 Fever, unspecified: Secondary | ICD-10-CM | POA: Diagnosis not present

## 2021-06-30 DIAGNOSIS — Z85828 Personal history of other malignant neoplasm of skin: Secondary | ICD-10-CM | POA: Insufficient documentation

## 2021-06-30 DIAGNOSIS — R609 Edema, unspecified: Secondary | ICD-10-CM | POA: Insufficient documentation

## 2021-06-30 DIAGNOSIS — R0902 Hypoxemia: Secondary | ICD-10-CM | POA: Diagnosis not present

## 2021-06-30 DIAGNOSIS — I959 Hypotension, unspecified: Secondary | ICD-10-CM | POA: Diagnosis not present

## 2021-06-30 DIAGNOSIS — I491 Atrial premature depolarization: Secondary | ICD-10-CM | POA: Diagnosis not present

## 2021-06-30 LAB — COMPREHENSIVE METABOLIC PANEL
ALT: 14 U/L (ref 0–44)
AST: 24 U/L (ref 15–41)
Albumin: 3.2 g/dL — ABNORMAL LOW (ref 3.5–5.0)
Alkaline Phosphatase: 56 U/L (ref 38–126)
Anion gap: 9 (ref 5–15)
BUN: 10 mg/dL (ref 8–23)
CO2: 24 mmol/L (ref 22–32)
Calcium: 8.8 mg/dL — ABNORMAL LOW (ref 8.9–10.3)
Chloride: 105 mmol/L (ref 98–111)
Creatinine, Ser: 0.57 mg/dL (ref 0.44–1.00)
GFR, Estimated: >60 mL/min (ref >60)
Glucose, Bld: 96 mg/dL (ref 70–99)
Potassium: 3.7 mmol/L (ref 3.5–5.1)
Sodium: 138 mmol/L (ref 135–145)
Total Bilirubin: 0.7 mg/dL (ref 0.3–1.2)
Total Protein: 5.9 g/dL — ABNORMAL LOW (ref 6.5–8.1)

## 2021-06-30 LAB — CBC WITH DIFFERENTIAL/PLATELET
Abs Immature Granulocytes: 0.02 10*3/uL (ref 0.00–0.07)
Basophils Absolute: 0 10*3/uL (ref 0.0–0.1)
Basophils Relative: 1 %
Eosinophils Absolute: 0.1 10*3/uL (ref 0.0–0.5)
Eosinophils Relative: 2 %
HCT: 38.3 % (ref 36.0–46.0)
Hemoglobin: 12.7 g/dL (ref 12.0–15.0)
Immature Granulocytes: 0 %
Lymphocytes Relative: 22 %
Lymphs Abs: 1.3 10*3/uL (ref 0.7–4.0)
MCH: 31.3 pg (ref 26.0–34.0)
MCHC: 33.2 g/dL (ref 30.0–36.0)
MCV: 94.3 fL (ref 80.0–100.0)
Monocytes Absolute: 0.8 10*3/uL (ref 0.1–1.0)
Monocytes Relative: 14 %
Neutro Abs: 3.6 10*3/uL (ref 1.7–7.7)
Neutrophils Relative %: 61 %
Platelets: 207 10*3/uL (ref 150–400)
RBC: 4.06 MIL/uL (ref 3.87–5.11)
RDW: 13.4 % (ref 11.5–15.5)
WBC: 5.8 10*3/uL (ref 4.0–10.5)
nRBC: 0 % (ref 0.0–0.2)

## 2021-06-30 LAB — URINALYSIS, COMPLETE (UACMP) WITH MICROSCOPIC
Bacteria, UA: NONE SEEN
Bilirubin Urine: NEGATIVE
Glucose, UA: NEGATIVE mg/dL
Ketones, ur: 20 mg/dL — AB
Leukocytes,Ua: NEGATIVE
Nitrite: NEGATIVE
Protein, ur: NEGATIVE mg/dL
Specific Gravity, Urine: 1.018 (ref 1.005–1.030)
pH: 6 (ref 5.0–8.0)

## 2021-06-30 LAB — RESP PANEL BY RT-PCR (FLU A&B, COVID) ARPGX2
Influenza A by PCR: NEGATIVE
Influenza B by PCR: NEGATIVE
SARS Coronavirus 2 by RT PCR: POSITIVE — AB

## 2021-06-30 MED ORDER — ACETAMINOPHEN 325 MG PO TABS
650.0000 mg | ORAL_TABLET | Freq: Four times a day (QID) | ORAL | Status: DC | PRN
  Administered 2021-06-30: 650 mg via ORAL
  Filled 2021-06-30: qty 2

## 2021-06-30 MED ORDER — PAXLOVID 10 X 150 MG & 10 X 100MG PO TBPK: 2.0000 | ORAL_TABLET | Freq: Two times a day (BID) | ORAL | 0 refills | Status: AC

## 2021-06-30 NOTE — Discharge Instructions (Addendum)
It was a pleasure caring for you! You came in for weakness and was found to be COVID-19 positive. You had a fever which resolved with Tylenol. You can continue taking Tylenol at your facility for pain or fever. I have also prescribed Paxlovid (information attached) for you to take for 5 days.  Return if you have high fevers greater than 102, symptoms worsen, you have difficulty breathing, or any new and concerning symptoms.

## 2021-06-30 NOTE — ED Notes (Signed)
Report given to Tawni Levy, RN

## 2021-06-30 NOTE — ED Provider Notes (Signed)
[place] EMERGENCY DEPARTMENT Provider Note   CSN: [id number] Arrival date & time: 06/30/21  1125     History Chief Complaint  Patient presents with   Weakness    Pt brought in via [facility] with c/c of stroke symptoms and covid. Pt is from [facility]. Pts states she had cough and scratchy throat and is tested for covid x2 a week, next being today. Pt stated she feel backward when she went to get up to bathroom, due to weakness and numbness to left leg. LKN 0230 am 7/28. EMS NIH 0, HA started this AM. EMS states that pt was able to put little pressure to left leg. Denies CP, N/V. Pt does endorse shOB this AM.   100.6 oral, 93%RA --> 96% 2L, 11   Shortness of Breath    Pt brought in via [facility] with c/c of stroke symptoms and covid. Pt is from [facility]. Pts states she had cough and scratchy throat and is tested for covid x2 a week, next being today. Pt stated she feel backward when she went to get up to bathroom, due to weakness and numbness to left leg. LKN 0230 am 7/28. EMS NIH 0, HA started this AM. EMS states that pt was able to put little pressure to left leg. Denies CP, N/V. Pt does endorse shOB this AM.   100.6 oral, 93%RA --> 96% 2L, 11    Susan [name] is a 87 y.o. female who presents after weakness and fall. This morning at around 8am tried to get out of bed but couldn't. Put feet on the floor and fell back on bed. Tried again a few minutes later, attempting to go to bathroom and fell over walker. Denies hitting her head. About 2 hours later got up again and left foot felt different from right . Denes any extremity pain. Unsure of speech changes since she did not talk to anyone No  vision changes, chest pain, endorses mild SOB. Had a headache across the front of her head which is still occurring. Denies hx of headaches . Denies N/V. Denies recent fever. Had sore throat. Hx IBD so has diarrhea chronically, no blood in stool   Denies recent falls.Does endorse previous stroke in 2015 with residual right sided weakness.    Past Medical History:  Diagnosis Date   Actinic keratosis, hx of    chest wall (2012)   Allergic rhinitis    Anxiety    Arthritis    "spine, hips" (01/30/2017)   Bursitis    Cellulitis of right foot 01/30/2017   Colon polyps    Diverticulosis    Esophageal dysmotilities 10/16/2017   Fibromyalgia    "qwhere" (01/30/2017)   Gallstones    GERD (gastroesophageal reflux disease)    High cholesterol    History of kidney stones    History of stomach ulcers    Hx of squamous cell carcinoma excision    right mandible (2011), right arm (2014)   Hypertension    IBS (irritable bowel syndrome)    Osteoarthritis    Pneumonia 2000s X 2   "twice" (01/30/2017)   Pyloric stenosis    Stroke ([place]) 10/2014   denies residual on 01/30/2017    Patient Active Problem List   Diagnosis Date Noted   Lumbar radiculopathy 09/22/2020   Tibialis posterior tendinitis, right 11/05/2019   DNR (do not resuscitate) discussion 02/20/2019   Anemia associated with acute blood loss 12/09/2018   Lymphangitis 12/06/2018  Chronic diarrhea 12/03/2018   Venous insufficiency of both lower extremities 11/06/2018   Hyperglycemia 05/01/2018   Contusion of left hip 04/22/2018   Esophageal dysmotilities 10/16/2017   Anxiety about health 05/16/2017   Hearing loss 05/16/2017   Greater trochanteric bursitis of both hips 04/24/2017   Sicca syndrome, unspecified 04/06/2017   Pyloric stenosis 04/06/2017   Hyperlipidemia 04/06/2017   Macular degeneration 04/06/2017   Fibromyalgia 03/21/2017   History of CVA (cerebrovascular accident) without residual deficits 03/21/2017   IBS (irritable bowel syndrome) 03/16/2017   Dysphagia 03/16/2017   GERD with esophagitis 03/16/2017   Arthritis of hip 02/13/2017   Essential hypertension 01/30/2017   Elevated troponin 01/30/2017    Past Surgical History:  Procedure Laterality Date    APPENDECTOMY     CARDIOVASCULAR STRESS TEST  04/2016   nuclear cardiolite stress test done in [place] (no ischemia, no LVH, no LV systolic function)   CATARACT EXTRACTION W/ INTRAOCULAR LENS  IMPLANT, BILATERAL Bilateral    CHOLECYSTECTOMY OPEN     COLONOSCOPY  2009, 2014   DIAGNOSTIC MAMMOGRAM  2009, 2012   TONSILLECTOMY       OB History     Gravida  4   Para  4   Term      Preterm      AB      Living         SAB      IAB      Ectopic      Multiple      Live Births              Family History  Problem Relation Age of Onset   Stroke Mother    Heart attack Mother    Stroke Father    Heart attack Father    Heart attack Maternal Grandmother    Stroke Maternal Grandmother    Stroke Paternal Grandmother    Heart attack Paternal Grandmother    Heart attack Maternal Grandfather    Stroke Maternal Grandfather    Stroke Paternal Grandfather    Heart attack Paternal Grandfather    Colon cancer Neg Hx    Pancreatic cancer Neg Hx    Stomach cancer Neg Hx    Esophageal cancer Neg Hx     Social History   Tobacco Use   Smoking status: Never   Smokeless tobacco: Never  Vaping Use   Vaping Use: Never used  Substance Use Topics   Alcohol use: Yes    Comment: glass of red wine maybe once week   Drug use: No    Home Medications Prior to Admission medications   Medication Sig Start Date End Date Taking? Authorizing Provider  acetaminophen (TYLENOL) 650 MG CR tablet Take 650 mg by mouth every 8 (eight) hours as needed for pain.   Yes [provider]  ALPRAZolam Duanne Moron) 0.5 MG tablet Take 1 tablet (0.5 mg total) by mouth daily as needed for anxiety. 03/28/21  Yes Nche, Charlene Brooke, NP  amLODipine (NORVASC) 10 MG tablet Take 1 tablet (10 mg total) by mouth at bedtime. 06/27/21  Yes Nche, Charlene Brooke, NP  cholestyramine (QUESTRAN) 4 g packet Take 1 packet (4 g total) by mouth 2 (two) times daily. 10/14/20  Yes Irene Shipper, MD  dicyclomine  (BENTYL) 10 MG capsule Take 1 capsule (10 mg total) by mouth 2 (two) times daily. 06/10/20  Yes Nche, Charlene Brooke, NP  diphenoxylate-atropine (LOMOTIL) 2.5-0.025 MG tablet TAKE 1 TABLET BY MOUTH TWICE DAILY  AS NEEDED FOR DIARRHEA OR LOOSE STOOLD Patient taking differently: Take 1 tablet by mouth 2 (two) times daily as needed for diarrhea or loose stools. 06/02/21  Yes Irene Shipper, MD  famotidine (PEPCID) 20 MG tablet Take 1 tablet (20 mg total) by mouth daily. 03/28/21  Yes Nche, Charlene Brooke, NP  fexofenadine (ALLEGRA) 180 MG tablet Take 180 mg by mouth daily.   Yes [provider]  furosemide (LASIX) 20 MG tablet Take 1 tablet (20 mg total) by mouth daily. 03/28/21  Yes Nche, Charlene Brooke, NP  IRON PO Take 65 mg by mouth 3 (three) times a week.   Yes [provider]  losartan (COZAAR) 100 MG tablet Take 1 tablet (100 mg total) by mouth daily. 06/27/21  Yes Nche, Charlene Brooke, NP  metoprolol succinate (TOPROL-XL) 25 MG 24 hr tablet Take 1 tablet (25 mg total) by mouth daily. 03/28/21  Yes Nche, Charlene Brooke, NP  nirmatrelvir/ritonavir EUA, renal dosing, (PAXLOVID) TBPK Take 2 tablets by mouth 2 (two) times daily for 5 days. Take nirmatrelvir (150 mg) one tablet twice daily for 5 days and ritonavir (100 mg) one tablet twice daily for 5 days. 06/30/21 07/05/21 Yes Deshay Blumenfeld, Weldon Picking, DO  potassium chloride SA (KLOR-CON) 20 MEQ tablet Take 1 tablet (20 mEq total) by mouth daily. 03/28/21  Yes Nche, Charlene Brooke, NP  Vitamin D, Ergocalciferol, (DRISDOL) 1.25 MG (50000 UNIT) CAPS capsule Take 1 capsule by mouth once a week Patient taking differently: Take 50,000 Units by mouth every 7 (seven) days. 05/26/21  Yes Lyndal Pulley, DO  aspirin EC 325 MG tablet Take 325 mg by mouth daily.    [provider]    Allergies    Butrans [buprenorphine], Codeine, and Morphine and related  Review of Systems   Review of Systems  Constitutional:  Negative for fever.  HENT:  Positive for  sore throat.   Eyes:  Negative for visual disturbance.  Respiratory:  Positive for shortness of breath.   Cardiovascular:  Positive for leg swelling. Negative for chest pain.  Gastrointestinal:  Positive for diarrhea. Negative for blood in stool.  Neurological:  Positive for weakness and numbness.   Physical Exam Updated Vital Signs BP 127/60   Pulse 64   Temp 98.9 F (37.2 C) (Oral)   Resp 16   Ht '5\' 1"'$  (1.549 m)   Wt 47.6 kg   SpO2 93%   BMI 19.84 kg/m   Physical Exam Constitutional:      General: She is not in acute distress. HENT:     Head: Normocephalic and atraumatic.  Eyes:     Extraocular Movements: Extraocular movements intact.     Pupils: Pupils are equal, round, and reactive to light.  Cardiovascular:     Rate and Rhythm: Normal rate and regular rhythm.  Pulmonary:     Effort: Pulmonary effort is normal.     Breath sounds: Normal breath sounds.  Abdominal:     Palpations: Abdomen is soft.     Tenderness: There is no abdominal tenderness.  Musculoskeletal:     Cervical back: Normal range of motion and neck supple.     Left lower leg: Edema present.  Skin:    General: Skin is warm and dry.  Neurological:     General: No focal deficit present.     Mental Status: She is alert.    ED Results / Procedures / Treatments   Labs (all labs ordered are listed, but only abnormal results are  displayed) Labs Reviewed  RESP PANEL BY RT-PCR (FLU A&B, COVID) ARPGX2 - Abnormal; Notable for the following components:      Result Value   SARS Coronavirus 2 by RT PCR POSITIVE (*)    All other components within normal limits  COMPREHENSIVE METABOLIC PANEL - Abnormal; Notable for the following components:   Calcium 8.8 (*)    Total Protein 5.9 (*)    Albumin 3.2 (*)    All other components within normal limits  URINALYSIS, COMPLETE (UACMP) WITH MICROSCOPIC - Abnormal; Notable for the following components:   Hgb urine dipstick SMALL (*)    Ketones, ur 20 (*)    All  other components within normal limits  CULTURE, BLOOD (ROUTINE X 2)  CULTURE, BLOOD (ROUTINE X 2)  CBC WITH DIFFERENTIAL/PLATELET    EKG None  Radiology DG Chest Port 1 View  Result Date: 06/30/2021 CLINICAL DATA:  Fever, dyspnea. EXAM: PORTABLE CHEST 1 VIEW COMPARISON:  January 18, 2018. FINDINGS: The heart size and mediastinal contours are within normal limits. Both lungs are clear. The visualized skeletal structures are unremarkable. IMPRESSION: No active disease. Electronically Signed   By: Marijo Conception M.D.   On: 06/30/2021 13:34    Procedures Procedures   Medications Ordered in ED Medications  acetaminophen (TYLENOL) tablet 650 mg (650 mg Oral Given 06/30/21 1313)    ED Course  I have reviewed the triage vital signs and the nursing notes.  Pertinent labs & imaging results that were available during my care of the patient were reviewed by me and considered in my medical decision making (see chart for details).  Clinical Course as of 06/30/21 1629  Thu Jun 30, 2021  1548 SpO2: 91 % [VP]    Clinical Course User Index [VP] Shary Key, DO   MDM Rules/Calculators/A&P                           Patient is a 87 y.o. female who presents after weakness and fall. This morning at around 8am felt dizzy and weak when she got out of bed and fell onto her walker, did not hit her head or lose consciousness. On presentation, temp of 100.6,  O2 sat 93-96% on 2LNC. Physical exam unremarkable, with normal neuro exam without acute focal deficits. Does have a chronic right sided weakness from previous stroke. Patient COVID-19 positive. CXR, CMP, CBC unremarkable. Fever defervesced after given Tylenol.  Patient and daughter declining MRI and requesting Paxil.  At time of discharge patient was able to maintain good O2 sats off of oxygen. We prescribed Paxlovid, and gave strict return precautions.  Final Clinical Impression(s) / ED Diagnoses Final diagnoses:  Weakness    Rx / DC  Orders ED Discharge Orders          Ordered    nirmatrelvir/ritonavir EUA, renal dosing, (PAXLOVID) TBPK  2 times daily        06/30/21 [place], [place], DO 06/30/21 1630    Elnora Morrison, MD 06/30/21 1735

## 2021-06-30 NOTE — ED Triage Notes (Signed)
Pt brought in via [facility] with c/c of stroke symptoms and covid. Pt is from [facility]. Pts states she had cough and scratchy throat and is tested for covid x2 a week, next being today. Pt stated she feel backward when she went to get up to bathroom, due to weakness and numbness to left leg. LKN 0230 am 7/28. EMS NIH 0, HA started this AM. EMS states that pt was able to put little pressure to left leg. Denies CP, N/V. Pt does endorse shOB this AM.   100.6 oral, 93%RA --> 96% 2L, 119/61, 73HR, NRS c/ PVC, BS 111  [id number]

## 2021-07-04 DIAGNOSIS — Z20828 Contact with and (suspected) exposure to other viral communicable diseases: Secondary | ICD-10-CM | POA: Diagnosis not present

## 2021-07-05 LAB — CULTURE, BLOOD (ROUTINE X 2)
Culture: NO GROWTH
Culture: NO GROWTH

## 2021-07-06 ENCOUNTER — Ambulatory Visit: Payer: Medicare Other | Admitting: Family Medicine

## 2021-07-07 DIAGNOSIS — Z20828 Contact with and (suspected) exposure to other viral communicable diseases: Secondary | ICD-10-CM | POA: Diagnosis not present

## 2021-07-11 DIAGNOSIS — Z20828 Contact with and (suspected) exposure to other viral communicable diseases: Secondary | ICD-10-CM | POA: Diagnosis not present

## 2021-07-14 DIAGNOSIS — Z20828 Contact with and (suspected) exposure to other viral communicable diseases: Secondary | ICD-10-CM | POA: Diagnosis not present

## 2021-07-21 DIAGNOSIS — Z8616 Personal history of COVID-19: Secondary | ICD-10-CM | POA: Diagnosis not present

## 2021-07-25 DIAGNOSIS — Z20828 Contact with and (suspected) exposure to other viral communicable diseases: Secondary | ICD-10-CM | POA: Diagnosis not present

## 2021-07-28 ENCOUNTER — Other Ambulatory Visit: Payer: Self-pay

## 2021-07-28 ENCOUNTER — Ambulatory Visit
Admission: RE | Admit: 2021-07-28 | Discharge: 2021-07-28 | Disposition: A | Discharge status: Home / Self Care | Payer: Medicare Other | Source: Ambulatory Visit | Attending: Family Medicine | Admitting: Family Medicine

## 2021-07-28 DIAGNOSIS — G8929 Other chronic pain: Secondary | ICD-10-CM

## 2021-07-28 DIAGNOSIS — M545 Low back pain, unspecified: Secondary | ICD-10-CM

## 2021-07-28 DIAGNOSIS — M47817 Spondylosis without myelopathy or radiculopathy, lumbosacral region: Secondary | ICD-10-CM | POA: Diagnosis not present

## 2021-07-28 MED ORDER — METHYLPREDNISOLONE ACETATE 40 MG/ML INJ SUSP (RADIOLOG
80.0000 mg | Freq: Once | INTRAMUSCULAR | Status: AC
  Administered 2021-07-28: 80 mg via EPIDURAL

## 2021-07-28 MED ORDER — IOPAMIDOL (ISOVUE-M 200) INJECTION 41%
1.0000 mL | Freq: Once | INTRAMUSCULAR | Status: AC
  Administered 2021-07-28: 1 mL via EPIDURAL

## 2021-07-28 NOTE — Discharge Instructions (Signed)

## 2021-07-30 ENCOUNTER — Other Ambulatory Visit: Payer: Self-pay | Admitting: Family Medicine

## 2021-07-30 ENCOUNTER — Other Ambulatory Visit: Payer: Self-pay | Admitting: Nurse Practitioner

## 2021-07-30 DIAGNOSIS — K529 Noninfective gastroenteritis and colitis, unspecified: Secondary | ICD-10-CM

## 2021-08-01 DIAGNOSIS — Z20828 Contact with and (suspected) exposure to other viral communicable diseases: Secondary | ICD-10-CM | POA: Diagnosis not present

## 2021-08-01 NOTE — Telephone Encounter (Signed)
Chart supports rx refill Last ov: 06/27/2021 Last refill: 04/27/2021

## 2021-08-04 DIAGNOSIS — Z20822 Contact with and (suspected) exposure to covid-19: Secondary | ICD-10-CM | POA: Diagnosis not present

## 2021-08-11 DIAGNOSIS — Z8616 Personal history of COVID-19: Secondary | ICD-10-CM | POA: Diagnosis not present

## 2021-08-18 DIAGNOSIS — Z23 Encounter for immunization: Secondary | ICD-10-CM | POA: Diagnosis not present

## 2021-08-18 DIAGNOSIS — Z20828 Contact with and (suspected) exposure to other viral communicable diseases: Secondary | ICD-10-CM | POA: Diagnosis not present

## 2021-08-25 DIAGNOSIS — Z20828 Contact with and (suspected) exposure to other viral communicable diseases: Secondary | ICD-10-CM | POA: Diagnosis not present

## 2021-09-01 DIAGNOSIS — Z8616 Personal history of COVID-19: Secondary | ICD-10-CM | POA: Diagnosis not present

## 2021-09-08 DIAGNOSIS — Z8616 Personal history of COVID-19: Secondary | ICD-10-CM | POA: Diagnosis not present

## 2021-09-08 NOTE — Progress Notes (Signed)
[name] Phone: [phone] Subjective:   Susan [name], [name]  This visit occurred during the SARS-CoV-2 public health emergency.  Safety protocols were in place, including screening questions prior to the visit, additional usage of staff PPE, and extensive cleaning of exam room while observing appropriate contact time as indicated for disinfecting solutions.   I'm seeing this patient by the request  of:  Nche, Charlene Brooke, NP  CC: Back pain and hip pain follow-up  [id number]  06/02/2021 Patient has responded fairly well though to the epidural.  Patient states that this is the most improvement she has had in quite some time.  Discussed with patient icing regimen and home exercises.  Discussed with patient about potentially repeating the epidural again or doing it every 33-month schedule.  Patient as well as daughter asked many good questions which were answered today.  At this point we will order another epidural but patient may put it off for another 2 months to see how patient does.  Discussed with patient otherwise secondary to her fall risk would not like to add any medications that can give her more difficulty.  Patient as well as her daughter do agree with this.  Follow-up with me again 2 months  Update 09/09/2021 Susan [name] is a 87 y.o. female coming in with complaint of LBP.  Epidural on 07/28/2021. Patient states that her pain has increased in past 2 weeks but it was helpful to reduce her pain more than previous injections.  Patient states that she has been able to do more activity such as walking with a little less pain.  Patient states that it has helped her hip pain as well.  Pain over lateral aspect of each hip continues.       Past Medical History:  Diagnosis Date   Actinic keratosis, hx of    chest wall (2012)   Allergic rhinitis    Anxiety     Arthritis    "spine, hips" (01/30/2017)   Bursitis    Cellulitis of right foot 01/30/2017   Colon polyps    Diverticulosis    Esophageal dysmotilities 10/16/2017   Fibromyalgia    "qwhere" (01/30/2017)   Gallstones    GERD (gastroesophageal reflux disease)    High cholesterol    History of kidney stones    History of stomach ulcers    Hx of squamous cell carcinoma excision    right mandible (2011), right arm (2014)   Hypertension    IBS (irritable bowel syndrome)    Osteoarthritis    Pneumonia 2000s X 2   "twice" (01/30/2017)   Pyloric stenosis    Stroke ([place]) 10/2014   denies residual on 01/30/2017   Past Surgical History:  Procedure Laterality Date   APPENDECTOMY     CARDIOVASCULAR STRESS TEST  04/2016   nuclear cardiolite stress test done in [place] ([name] ischemia, [name] LVH, [name] LV systolic function)   CATARACT EXTRACTION W/ INTRAOCULAR LENS  IMPLANT, BILATERAL Bilateral    CHOLECYSTECTOMY OPEN     COLONOSCOPY  2009, 2014   DIAGNOSTIC MAMMOGRAM  2009, 2012   TONSILLECTOMY     Social History   Socioeconomic History   Marital status: Widowed    Spouse name: Not on file   Number of children: 4   Years of education: Not on file   Highest education level: Not on file  Occupational History  Occupation: retired  Tobacco Use   Smoking status: Never   Smokeless tobacco: Never  Vaping Use   Vaping Use: Never used  Substance and Sexual Activity   Alcohol use: Yes    Comment: glass of red wine maybe once week   Drug use: [name]   Sexual activity: Not on file  Other Topics Concern   Not on file  Social History Narrative   Not on file   Social Determinants of Health   Financial Resource Strain: Low Risk    Difficulty of Paying Living Expenses: Not hard at all  Food Insecurity: [name] Food Insecurity   Worried About Charity fundraiser in the Last Year: Never true   [place] in the Last Year: Never true  Transportation Needs: Unknown   Lack of Transportation  (Medical): Not on file   Lack of Transportation (Non-Medical): [name]  Physical Activity: Insufficiently Active   Days of Exercise per Week: 3 days   Minutes of Exercise per Session: 20 min  Stress: [name] Stress Concern Present   Feeling of Stress : Not at all  Social Connections: Moderately Isolated   Frequency of Communication with Friends and Family: Three times a week   Frequency of Social Gatherings with Friends and Family: Twice a week   Attends Religious Services: 1 to 4 times per year   Active Member of [employer] or Organizations: [name]   Attends Archivist Meetings: Never   Marital Status: Widowed   Allergies  Allergen Reactions   Butrans [Buprenorphine] Other (See Comments)    Severe chest pains   Codeine Other (See Comments)    Severe Chest pains   Morphine And Related Other (See Comments)    Severe chest pains   Family History  Problem Relation Age of Onset   Stroke Mother    Heart attack Mother    Stroke Father    Heart attack Father    Heart attack Maternal Grandmother    Stroke Maternal Grandmother    Stroke Paternal Grandmother    Heart attack Paternal Grandmother    Heart attack Maternal Grandfather    Stroke Maternal Grandfather    Stroke Paternal Grandfather    Heart attack Paternal Grandfather    Colon cancer Neg Hx    Pancreatic cancer Neg Hx    Stomach cancer Neg Hx    Esophageal cancer Neg Hx      Current Outpatient Medications (Cardiovascular):    amLODipine (NORVASC) 10 MG tablet, Take 1 tablet (10 mg total) by mouth at bedtime.   cholestyramine (QUESTRAN) 4 g packet, Take 1 packet (4 g total) by mouth 2 (two) times daily.   furosemide (LASIX) 20 MG tablet, Take 1 tablet by mouth once daily   losartan (COZAAR) 100 MG tablet, Take 1 tablet (100 mg total) by mouth daily.   metoprolol succinate (TOPROL-XL) 25 MG 24 hr tablet, Take 1 tablet (25 mg total) by mouth daily.  Current Outpatient Medications (Respiratory):    fexofenadine (ALLEGRA) 180  MG tablet, Take 180 mg by mouth daily.  Current Outpatient Medications (Analgesics):    acetaminophen (TYLENOL) 650 MG CR tablet, Take 650 mg by mouth every 8 (eight) hours as needed for pain.   aspirin EC 325 MG tablet, Take 325 mg by mouth daily.  Current Outpatient Medications (Hematological):    IRON PO, Take 65 mg by mouth 3 (three) times a week.  Current Outpatient Medications (Other):    ALPRAZolam (XANAX) 0.5 MG tablet, Take  1 tablet (0.5 mg total) by mouth daily as needed for anxiety.   dicyclomine (BENTYL) 10 MG capsule, Take 1 capsule by mouth twice daily   diphenoxylate-atropine (LOMOTIL) 2.5-0.025 MG tablet, TAKE 1 TABLET BY MOUTH TWICE DAILY AS NEEDED FOR DIARRHEA OR LOOSE STOOLD (Patient taking differently: Take 1 tablet by mouth 2 (two) times daily as needed for diarrhea or loose stools.)   famotidine (PEPCID) 20 MG tablet, Take 1 tablet (20 mg total) by mouth daily.   potassium chloride SA (KLOR-CON) 20 MEQ tablet, Take 1 tablet (20 mEq total) by mouth daily.   Vitamin D, Ergocalciferol, (DRISDOL) 1.25 MG (50000 UNIT) CAPS capsule, Take 1 capsule by mouth once a week     Review of Systems:  [name] headache, visual changes, nausea, vomiting, diarrhea, constipation, dizziness, abdominal pain, skin rash, fevers, chills, night sweats, weight loss, swollen lymph nodes,  joint swelling, chest pain, shortness of breath, mood changes. POSITIVE muscle aches, body aches  Objective  Blood pressure 122/74, pulse 68, height 5\' 1"  (1.549 m), weight 106 lb (48.1 kg), SpO2 95 %.   General: [name] apparent distress alert and oriented x3 mood and affect normal, dressed appropriately.  HEENT: Pupils equal, extraocular movements intact  Respiratory: Patient's speak in full sentences and does not appear short of breath  Cardiovascular: [name] lower extremity edema, non tender, [name] erythema  Gait antalgic using the walker to aid in ambulation. Back exam very minimal tenderness in the paraspinal  musculature of the lumbar spine bilaterally.    Impression and Recommendations:     The above documentation has been reviewed and is accurate and complete Lyndal Pulley, DO

## 2021-09-09 ENCOUNTER — Ambulatory Visit: Payer: Self-pay

## 2021-09-09 ENCOUNTER — Ambulatory Visit (INDEPENDENT_AMBULATORY_CARE_PROVIDER_SITE_OTHER): Payer: Medicare Other | Admitting: Family Medicine

## 2021-09-09 ENCOUNTER — Encounter: Payer: Self-pay | Admitting: Family Medicine

## 2021-09-09 ENCOUNTER — Other Ambulatory Visit: Payer: Self-pay

## 2021-09-09 VITALS — BP 122/74 | HR 68 | Ht 61.0 in | Wt 106.0 lb

## 2021-09-09 DIAGNOSIS — M5416 Radiculopathy, lumbar region: Secondary | ICD-10-CM | POA: Diagnosis not present

## 2021-09-09 DIAGNOSIS — M25551 Pain in right hip: Secondary | ICD-10-CM | POA: Diagnosis not present

## 2021-09-09 DIAGNOSIS — M25552 Pain in left hip: Secondary | ICD-10-CM | POA: Diagnosis not present

## 2021-09-09 NOTE — Patient Instructions (Signed)
1064mcg b12 100mg  b6 in B complex vitamin Epidural schedule at your leisure See me in 3 months

## 2021-09-09 NOTE — Assessment & Plan Note (Signed)
I do believe that patient's pain is secondary to more of a lumbar radiculopathy and patient has responded extremely well

## 2021-09-20 DIAGNOSIS — Z85828 Personal history of other malignant neoplasm of skin: Secondary | ICD-10-CM | POA: Diagnosis not present

## 2021-09-20 DIAGNOSIS — L821 Other seborrheic keratosis: Secondary | ICD-10-CM | POA: Diagnosis not present

## 2021-09-20 DIAGNOSIS — L570 Actinic keratosis: Secondary | ICD-10-CM | POA: Diagnosis not present

## 2021-09-30 ENCOUNTER — Other Ambulatory Visit: Payer: Self-pay | Admitting: Internal Medicine

## 2021-09-30 ENCOUNTER — Other Ambulatory Visit: Payer: Self-pay | Admitting: Nurse Practitioner

## 2021-09-30 DIAGNOSIS — F418 Other specified anxiety disorders: Secondary | ICD-10-CM

## 2021-10-04 ENCOUNTER — Telehealth: Payer: Self-pay | Admitting: Nurse Practitioner

## 2021-10-05 ENCOUNTER — Other Ambulatory Visit: Payer: Self-pay | Admitting: Nurse Practitioner

## 2021-10-05 ENCOUNTER — Encounter: Payer: Self-pay | Admitting: Nurse Practitioner

## 2021-10-05 DIAGNOSIS — F418 Other specified anxiety disorders: Secondary | ICD-10-CM

## 2021-10-05 MED ORDER — ALPRAZOLAM 0.5 MG PO TABS: 0.5000 mg | ORAL_TABLET | Freq: Every day | ORAL | 5 refills | Status: DC | PRN

## 2021-10-05 NOTE — Telephone Encounter (Signed)
Per pharmacy last date filled 08/27/21

## 2021-10-18 DIAGNOSIS — L821 Other seborrheic keratosis: Secondary | ICD-10-CM | POA: Diagnosis not present

## 2021-10-18 DIAGNOSIS — L820 Inflamed seborrheic keratosis: Secondary | ICD-10-CM | POA: Diagnosis not present

## 2021-10-18 DIAGNOSIS — Z20822 Contact with and (suspected) exposure to covid-19: Secondary | ICD-10-CM | POA: Diagnosis not present

## 2021-10-18 DIAGNOSIS — L570 Actinic keratosis: Secondary | ICD-10-CM | POA: Diagnosis not present

## 2021-10-18 DIAGNOSIS — Z85828 Personal history of other malignant neoplasm of skin: Secondary | ICD-10-CM | POA: Diagnosis not present

## 2021-10-26 ENCOUNTER — Other Ambulatory Visit: Payer: Self-pay | Admitting: Nurse Practitioner

## 2021-10-26 ENCOUNTER — Other Ambulatory Visit: Payer: Self-pay | Admitting: Internal Medicine

## 2021-10-26 DIAGNOSIS — I10 Essential (primary) hypertension: Secondary | ICD-10-CM

## 2021-10-26 DIAGNOSIS — K529 Noninfective gastroenteritis and colitis, unspecified: Secondary | ICD-10-CM

## 2021-10-26 DIAGNOSIS — I872 Venous insufficiency (chronic) (peripheral): Secondary | ICD-10-CM

## 2021-10-31 ENCOUNTER — Other Ambulatory Visit: Payer: Self-pay | Admitting: Family Medicine

## 2021-11-02 NOTE — Telephone Encounter (Signed)
Chart supports Rx Last seen 06/2021 Next OV 12/2021

## 2021-11-08 ENCOUNTER — Other Ambulatory Visit: Payer: Self-pay

## 2021-11-08 ENCOUNTER — Ambulatory Visit
Admission: RE | Admit: 2021-11-08 | Discharge: 2021-11-08 | Disposition: A | Discharge status: Home / Self Care | Payer: Medicare Other | Source: Ambulatory Visit | Attending: Family Medicine | Admitting: Family Medicine

## 2021-11-08 DIAGNOSIS — M5416 Radiculopathy, lumbar region: Secondary | ICD-10-CM

## 2021-11-08 DIAGNOSIS — M47817 Spondylosis without myelopathy or radiculopathy, lumbosacral region: Secondary | ICD-10-CM | POA: Diagnosis not present

## 2021-11-08 MED ORDER — METHYLPREDNISOLONE ACETATE 40 MG/ML INJ SUSP (RADIOLOG
80.0000 mg | Freq: Once | INTRAMUSCULAR | Status: AC
  Administered 2021-11-08: 80 mg via EPIDURAL

## 2021-11-08 MED ORDER — IOPAMIDOL (ISOVUE-M 200) INJECTION 41%
1.0000 mL | Freq: Once | INTRAMUSCULAR | Status: AC
  Administered 2021-11-08: 1 mL via EPIDURAL

## 2021-11-08 NOTE — Discharge Instructions (Signed)
Post Procedure Spinal Discharge Instruction Sheet  You may resume a regular diet and any medications that you routinely take (including pain medications) unless otherwise noted by MD.  No driving day of procedure.  Light activity throughout the rest of the day.  Do not do any strenuous work, exercise, bending or lifting.  The day following the procedure, you can resume normal physical activity but you should refrain from exercising or physical therapy for at least three days thereafter.  You may apply ice to the injection site, 20 minutes on, 20 minutes off, as needed. Do not apply ice directly to skin.    Common Side Effects:  Headaches- take your usual medications as directed by your physician.  Increase your fluid intake.  Caffeinated beverages may be helpful.  Lie flat in bed until your headache resolves.  Restlessness or inability to sleep- you may have trouble sleeping for the next few days.  Ask your referring physician if you need any medication for sleep.  Facial flushing or redness- should subside within a few days.  Increased pain- a temporary increase in pain a day or two following your procedure is not unusual.  Take your pain medication as prescribed by your referring physician.  Leg cramps  Please contact our office at [phone] for the following symptoms: Fever greater than 100 degrees. Headaches unresolved with medication after 2-3 days. Increased swelling, pain, or redness at injection site.   Thank you for visiting [facility] today.    YOU MAY RESUME YOUR ASPIRIN ANYTIME AFTER INJECTION

## 2021-11-15 DIAGNOSIS — Z23 Encounter for immunization: Secondary | ICD-10-CM | POA: Diagnosis not present

## 2021-11-18 DIAGNOSIS — Z23 Encounter for immunization: Secondary | ICD-10-CM | POA: Diagnosis not present

## 2021-11-28 ENCOUNTER — Other Ambulatory Visit: Payer: Self-pay | Admitting: Internal Medicine

## 2021-11-28 ENCOUNTER — Other Ambulatory Visit: Payer: Self-pay | Admitting: Nurse Practitioner

## 2021-11-28 DIAGNOSIS — K2100 Gastro-esophageal reflux disease with esophagitis, without bleeding: Secondary | ICD-10-CM

## 2021-12-13 NOTE — Progress Notes (Signed)
[place] [facility] [place] Phone: [phone] Subjective:   [name], [name] This visit occurred during the SARS-CoV-2 public health emergency.  Safety protocols were in place, including screening questions prior to the visit, additional usage of staff PPE, and extensive cleaning of exam room while observing appropriate contact time as indicated for disinfecting solutions.   I'm seeing this patient by the request  of:  Nche, Charlene Brooke, NP  CC: Bilateral hip pain  LNL:[id number]  09/09/2021 I do believe that patient's pain is secondary to more of a lumbar radiculopathy and patient has responded extremely well  Updated 12/14/2021 Susan [name] is a 88 y.o. female coming in with complaint of bilateral hip pain.  Patient has had this pain previously.  Seems to be more secondary to a lumbar radiculopathy.  Did have another injection in November 08, 2021.  Patient states that she did not have as much relief from last epidural. She said that both the back and hips continue to bother her especially with standing and walking. Using Tylenol and hemp cream.   Also complaining of L knee pain. Feels like knee is going to give out on her. Pain in anterior and posterior aspect.        Past Medical History:  Diagnosis Date   Actinic keratosis, hx of    chest wall (2012)   Allergic rhinitis    Anxiety    Arthritis    "spine, hips" (01/30/2017)   Bursitis    Cellulitis of right foot 01/30/2017   Colon polyps    Diverticulosis    Esophageal dysmotilities 10/16/2017   Fibromyalgia    "qwhere" (01/30/2017)   Gallstones    GERD (gastroesophageal reflux disease)    High cholesterol    History of kidney stones    History of stomach ulcers    Hx of squamous cell carcinoma excision    right mandible (2011), right arm (2014)   Hypertension    IBS (irritable bowel syndrome)    Osteoarthritis     Pneumonia 2000s X 2   "twice" (01/30/2017)   Pyloric stenosis    Stroke ([place]) 10/2014   denies residual on 01/30/2017   Past Surgical History:  Procedure Laterality Date   APPENDECTOMY     CARDIOVASCULAR STRESS TEST  04/2016   nuclear cardiolite stress test done in [place] (no ischemia, no LVH, no LV systolic function)   CATARACT EXTRACTION W/ INTRAOCULAR LENS  IMPLANT, BILATERAL Bilateral    CHOLECYSTECTOMY OPEN     COLONOSCOPY  2009, 2014   DIAGNOSTIC MAMMOGRAM  2009, 2012   TONSILLECTOMY     Social History   Socioeconomic History   Marital status: Widowed    Spouse name: Not on file   Number of children: 4   Years of education: Not on file   Highest education level: Not on file  Occupational History   Occupation: retired  Tobacco Use   Smoking status: Never   Smokeless tobacco: Never  Vaping Use   Vaping Use: Never used  Substance and Sexual Activity   Alcohol use: Yes    Comment: glass of red wine maybe once week   Drug use: No   Sexual activity: Not on file  Other Topics Concern   Not on file  Social History Narrative   Not on file   Social Determinants of Health   Financial Resource Strain: Low Risk  Difficulty of Paying Living Expenses: Not hard at all  Food Insecurity: No Food Insecurity   Worried About [place] in the Last Year: Never true   Ran Out of Food in the Last Year: Never true  Transportation Needs: Unknown   Lack of Transportation (Medical): Not on file   Lack of Transportation (Non-Medical): No  Physical Activity: Insufficiently Active   Days of Exercise per Week: 3 days   Minutes of Exercise per Session: 20 min  Stress: No Stress Concern Present   Feeling of Stress : Not at all  Social Connections: Moderately Isolated   Frequency of Communication with Friends and Family: Three times a week   Frequency of Social Gatherings with Friends and Family: Twice a week   Attends Religious Services: 1 to 4 times per year   Active  Member of [employer] or Organizations: No   Attends Archivist Meetings: Never   Marital Status: Widowed   Allergies  Allergen Reactions   Butrans [Buprenorphine] Other (See Comments)    Severe chest pains   Codeine Other (See Comments)    Severe Chest pains   Morphine And Related Other (See Comments)    Severe chest pains   Family History  Problem Relation Age of Onset   Stroke Mother    Heart attack Mother    Stroke Father    Heart attack Father    Heart attack Maternal Grandmother    Stroke Maternal Grandmother    Stroke Paternal Grandmother    Heart attack Paternal Grandmother    Heart attack Maternal Grandfather    Stroke Maternal Grandfather    Stroke Paternal Grandfather    Heart attack Paternal Grandfather    Colon cancer Neg Hx    Pancreatic cancer Neg Hx    Stomach cancer Neg Hx    Esophageal cancer Neg Hx      Current Outpatient Medications (Cardiovascular):    amLODipine (NORVASC) 10 MG tablet, Take 1 tablet (10 mg total) by mouth at bedtime.   cholestyramine (QUESTRAN) 4 g packet, Take 1 packet (4 g total) by mouth 2 (two) times daily.   furosemide (LASIX) 20 MG tablet, Take 1 tablet by mouth once daily   losartan (COZAAR) 100 MG tablet, Take 1 tablet (100 mg total) by mouth daily.   metoprolol succinate (TOPROL-XL) 25 MG 24 hr tablet, Take 1 tablet (25 mg total) by mouth daily.  Current Outpatient Medications (Respiratory):    fexofenadine (ALLEGRA) 180 MG tablet, Take 180 mg by mouth daily.  Current Outpatient Medications (Analgesics):    acetaminophen (TYLENOL) 650 MG CR tablet, Take 650 mg by mouth every 8 (eight) hours as needed for pain.   aspirin EC 325 MG tablet, Take 325 mg by mouth daily.  Current Outpatient Medications (Hematological):    IRON PO, Take 65 mg by mouth 3 (three) times a week.  Current Outpatient Medications (Other):    ALPRAZolam (XANAX) 0.5 MG tablet, Take 1 tablet (0.5 mg total) by mouth daily as needed for anxiety.    dicyclomine (BENTYL) 10 MG capsule, Take 1 capsule by mouth twice daily   diphenoxylate-atropine (LOMOTIL) 2.5-0.025 MG tablet, TAKE 1 TABLET BY MOUTH TWICE DAILY AS NEEDED FOR DIARRHEA OR LOOSE STOOLS   DULoxetine (CYMBALTA) 20 MG capsule, Take 1 capsule (20 mg total) by mouth daily.   famotidine (PEPCID) 20 MG tablet, Take 1 tablet by mouth once daily   potassium chloride SA (KLOR-CON) 20 MEQ tablet, Take 1 tablet (20 mEq  total) by mouth daily.   Vitamin D, Ergocalciferol, (DRISDOL) 1.25 MG (50000 UNIT) CAPS capsule, Take 1 capsule by mouth once a week   Review of Systems:  No headache, visual changes, nausea, vomiting, diarrhea, constipation, dizziness, abdominal pain, skin rash, fevers, chills, night sweats, weight loss, swollen lymph nodes,  joint swelling, chest pain, shortness of breath, mood changes. POSITIVE muscle aches, body aches  Objective  Blood pressure (!) 146/82, pulse 73, height 5\' 1"  (1.549 m), weight 108 lb (49 kg), SpO2 97 %.   General: No apparent distress alert and oriented x3 mood and affect normal, dressed appropriately.  HEENT: Pupils equal, extraocular movements intact  Respiratory: Patient's speak in full sentences and does not appear short of breath  Cardiovascular: No lower extremity edema, non tender, no erythema  Gait antalgic using the aid of a rolling walker MSK: Left knee exam does have some arthritic changes noted.  Some mild crepitus noted.  Mild instability with valgus and varus force.  Patient diffusely has some soft tissue tenderness but this seems to be consistent with the contralateral side.  No swelling of the lower extremities noted at this moment.  After informed written and verbal consent, patient was seated on exam table. Left knee was prepped with alcohol swab and utilizing anterolateral approach, patient's left knee space was injected with 4:1  marcaine 0.5%: Kenalog 40mg /dL. Patient tolerated the procedure well without immediate  complications.    Impression and Recommendations:    The above documentation has been reviewed and is accurate and complete Lyndal Pulley, DO

## 2021-12-14 ENCOUNTER — Encounter: Payer: Self-pay | Admitting: Family Medicine

## 2021-12-14 ENCOUNTER — Ambulatory Visit (INDEPENDENT_AMBULATORY_CARE_PROVIDER_SITE_OTHER): Payer: Medicare Other | Admitting: Family Medicine

## 2021-12-14 ENCOUNTER — Other Ambulatory Visit: Payer: Self-pay

## 2021-12-14 DIAGNOSIS — M7062 Trochanteric bursitis, left hip: Secondary | ICD-10-CM

## 2021-12-14 DIAGNOSIS — M170 Bilateral primary osteoarthritis of knee: Secondary | ICD-10-CM | POA: Insufficient documentation

## 2021-12-14 DIAGNOSIS — M1712 Unilateral primary osteoarthritis, left knee: Secondary | ICD-10-CM | POA: Diagnosis not present

## 2021-12-14 DIAGNOSIS — M797 Fibromyalgia: Secondary | ICD-10-CM

## 2021-12-14 DIAGNOSIS — M7061 Trochanteric bursitis, right hip: Secondary | ICD-10-CM | POA: Diagnosis not present

## 2021-12-14 MED ORDER — DULOXETINE HCL 20 MG PO CPEP: 20.0000 mg | ORAL_CAPSULE | Freq: Every day | ORAL | 3 refills | Status: DC

## 2021-12-14 NOTE — Patient Instructions (Addendum)
Cymbalta daily Injected L knee today See me again in 4-6 weeks

## 2021-12-14 NOTE — Assessment & Plan Note (Signed)
Patient has had significant difficulty with the pain everywhere.  Patient is a 72 gabapentin activities.  Patient states that prednisone does help with sleep but do not want to do this regularly.  Do feel that fibromyalgia as well as the underlying arthritis could be contributing.  There is evidence of Cymbalta helping.  We will start at a very low dose.  Patient does have some underlying anxiety that this may help with his well.  Warned of potential side effects.  Patient given a handout as well and will discuss with family.  We will send note to primary care provider to keep her also knowledgeable.  Follow-up with me again in 6 to 8 weeks

## 2021-12-14 NOTE — Assessment & Plan Note (Signed)
Patient's pain of the left knee seems to be more anterior.  Does have some arthritic changes noted.  Given injection today and tolerated the procedure well.  He could be a candidate for viscosupplementation.  Does have some instability noted with valgus and varus force.  Due to patient's age she would not be a surgical candidate.  Patient does not have any significant calf pain. Of her fibromyalgia.  No significant increase in swelling of the lower tenderness.  Discussed with patient mother that if any signs of these to seek medical attention.  Follow-up with me again in 4 to 6 weeks.

## 2021-12-14 NOTE — Assessment & Plan Note (Signed)
Patient does have significant atrophy of the hips.  We will monitor.  May consider the possibility of another injection but will hold at the moment.  We will see how patient responds to the low-dose of the Cymbalta.

## 2021-12-15 ENCOUNTER — Other Ambulatory Visit: Payer: Self-pay | Admitting: Internal Medicine

## 2021-12-28 ENCOUNTER — Other Ambulatory Visit: Payer: Self-pay

## 2021-12-28 ENCOUNTER — Encounter: Payer: Self-pay | Admitting: Nurse Practitioner

## 2021-12-28 ENCOUNTER — Ambulatory Visit (INDEPENDENT_AMBULATORY_CARE_PROVIDER_SITE_OTHER): Payer: Medicare Other | Admitting: Nurse Practitioner

## 2021-12-28 VITALS — BP 100/58 | HR 63 | Temp 97.4°F | Ht 61.0 in | Wt 105.2 lb

## 2021-12-28 DIAGNOSIS — I10 Essential (primary) hypertension: Secondary | ICD-10-CM | POA: Diagnosis not present

## 2021-12-28 DIAGNOSIS — R0982 Postnasal drip: Secondary | ICD-10-CM

## 2021-12-28 DIAGNOSIS — R1310 Dysphagia, unspecified: Secondary | ICD-10-CM

## 2021-12-28 DIAGNOSIS — R739 Hyperglycemia, unspecified: Secondary | ICD-10-CM | POA: Diagnosis not present

## 2021-12-28 DIAGNOSIS — K2100 Gastro-esophageal reflux disease with esophagitis, without bleeding: Secondary | ICD-10-CM | POA: Diagnosis not present

## 2021-12-28 DIAGNOSIS — E782 Mixed hyperlipidemia: Secondary | ICD-10-CM | POA: Diagnosis not present

## 2021-12-28 DIAGNOSIS — K224 Dyskinesia of esophagus: Secondary | ICD-10-CM | POA: Diagnosis not present

## 2021-12-28 DIAGNOSIS — H9193 Unspecified hearing loss, bilateral: Secondary | ICD-10-CM

## 2021-12-28 LAB — COMPREHENSIVE METABOLIC PANEL
ALT: 18 U/L (ref 0–35)
AST: 25 U/L (ref 0–37)
Albumin: 4.1 g/dL (ref 3.5–5.2)
Alkaline Phosphatase: 75 U/L (ref 39–117)
BUN: 23 mg/dL (ref 6–23)
CO2: 31 mEq/L (ref 19–32)
Calcium: 9.3 mg/dL (ref 8.4–10.5)
Chloride: 101 mEq/L (ref 96–112)
Creatinine, Ser: 0.64 mg/dL (ref 0.40–1.20)
GFR: 75.29 mL/min (ref >60.00)
Glucose, Bld: 101 mg/dL — ABNORMAL HIGH (ref 70–99)
Potassium: 4.1 mEq/L (ref 3.5–5.1)
Sodium: 140 mEq/L (ref 135–145)
Total Bilirubin: 0.4 mg/dL (ref 0.2–1.2)
Total Protein: 7.3 g/dL (ref 6.0–8.3)

## 2021-12-28 LAB — LIPID PANEL
Cholesterol: 219 mg/dL — ABNORMAL HIGH (ref 0–200)
HDL: 71.4 mg/dL (ref >39.00)
LDL Cholesterol: 117 mg/dL — ABNORMAL HIGH (ref 0–99)
NonHDL: 147.52
Total CHOL/HDL Ratio: 3
Triglycerides: 151 mg/dL — ABNORMAL HIGH (ref 0.0–149.0)
VLDL: 30.2 mg/dL (ref 0.0–40.0)

## 2021-12-28 LAB — TSH: TSH: 2.5 u[IU]/mL (ref 0.35–5.50)

## 2021-12-28 LAB — CBC
HCT: 40.1 % (ref 36.0–46.0)
Hemoglobin: 13.1 g/dL (ref 12.0–15.0)
MCHC: 32.6 g/dL (ref 30.0–36.0)
MCV: 94 fl (ref 78.0–100.0)
Platelets: 248 10*3/uL (ref 150.0–400.0)
RBC: 4.27 Mil/uL (ref 3.87–5.11)
RDW: 13.5 % (ref 11.5–15.5)
WBC: 6.8 10*3/uL (ref 4.0–10.5)

## 2021-12-28 LAB — HEMOGLOBIN A1C: Hgb A1c MFr Bld: 5.4 % (ref 4.6–6.5)

## 2021-12-28 MED ORDER — FAMOTIDINE 20 MG PO TABS: 20.0000 mg | ORAL_TABLET | Freq: Two times a day (BID) | ORAL | 1 refills | Status: DC

## 2021-12-28 MED ORDER — FOOD THICKENER (THICKENUP CLEAR): 1.0000 g | ORAL | 1 refills | Status: DC | PRN

## 2021-12-28 MED ORDER — FLUTICASONE PROPIONATE 50 MCG/ACT NA SUSP: 1.0000 | Freq: Two times a day (BID) | NASAL | 1 refills | Status: DC

## 2021-12-28 MED ORDER — SALINE SPRAY 0.65 % NA SOLN
1.0000 | NASAL | 0 refills | Status: DC | PRN
Start: 2021-12-28 — End: 2024-01-07

## 2021-12-28 MED ORDER — LOSARTAN POTASSIUM 50 MG PO TABS: 50.0000 mg | ORAL_TABLET | Freq: Every day | ORAL | 3 refills | Status: DC

## 2021-12-28 NOTE — Assessment & Plan Note (Signed)
Stable mood with cymbalta and alprazolam

## 2021-12-28 NOTE — Assessment & Plan Note (Signed)
SBP at home: 110 Reports fatigue with new OF:[id number] in last 2weeks. BP Readings from Last 3 Encounters:  12/28/21 (!) 100/58  12/14/21 (!) 146/82  11/08/21 134/62   We discussed decreasing amlodipine dose to 5mg , but she decided to decrease losartan dose to 50mg  instead, because she already has 50mg  tabs at home. New losartan rx sent. Advised to monitor BP at home 1-2x/week Call office if >140/80 or <100/60

## 2021-12-28 NOTE — Assessment & Plan Note (Signed)
Reports strangulation and coughing with thin liquids only. Also experiences hoarseness (waxing and waning through out the day). Associated with post nasal drainage which is worse in AM. She is no interested in repeat swallow eval and/or refe to GI at this time. We discussed use of food thickener in thin liquids. Advised to start with honer consistency. Increase famotidine to 20mg  BID Did not increase allegra dose due to risk of increase sedation. Consider modified swallow eval if no improvement F/up in 46months or sooner

## 2021-12-28 NOTE — Assessment & Plan Note (Signed)
Repeat hgbA1c 

## 2021-12-28 NOTE — Progress Notes (Signed)
Subjective:  Patient ID: Susan [name], female    DOB: 11/05/1924  Age: 88 y.o. MRN: [id number]  CC: Follow-up (6 month f/u on HTN, anxiety and hyperglycemia. )   HPI  Essential hypertension SBP at home: 110 Reports fatigue with new TM:[id number] in last 2weeks. BP Readings from Last 3 Encounters:  12/28/21 (!) 100/58  12/14/21 (!) 146/82  11/08/21 134/62   We discussed decreasing amlodipine dose to 5mg , but she decided to decrease losartan dose to 50mg  instead, because she already has 50mg  tabs at home. New losartan rx sent. Advised to monitor BP at home 1-2x/week Call office if >140/80 or <100/60  Esophageal dysmotilities Reports strangulation and coughing with thin liquids only. Also experiences hoarseness (waxing and waning through out the day). Associated with post nasal drainage which is worse in AM. She is no interested in repeat swallow eval and/or refe to GI at this time. We discussed use of food thickener in thin liquids. Advised to start with honer consistency. Increase famotidine to 20mg  BID Did not increase allegra dose due to risk of increase sedation. Consider modified swallow eval if no improvement F/up in 52months or sooner  Hyperlipidemia Repeat lipid panel  Hyperglycemia Repeat hgbA1c  Anxiety about health Stable mood with cymbalta and alprazolam  Wt Readings from Last 3 Encounters:  12/28/21 105 lb 3.2 oz (47.7 kg)  12/14/21 108 lb (49 kg)  09/09/21 106 lb (48.1 kg)    BP Readings from Last 3 Encounters:  12/28/21 (!) 100/58  12/14/21 (!) 146/82  11/08/21 134/62     Reviewed past Medical, Social and Family history today.  Outpatient Medications Prior to Visit  Medication Sig Dispense Refill   acetaminophen (TYLENOL) 650 MG CR tablet Take 650 mg by mouth every 8 (eight) hours as needed for pain.     ALPRAZolam (XANAX) 0.5 MG tablet Take 1 tablet (0.5 mg total) by mouth daily as needed for anxiety. 30 tablet 5   amLODipine (NORVASC) 10 MG  tablet Take 1 tablet (10 mg total) by mouth at bedtime. 90 tablet 3   aspirin EC 325 MG tablet Take 325 mg by mouth daily.     cholestyramine (QUESTRAN) 4 g packet DISSOLVE & TAKE 1 POWDER BY MOUTH TWICE DAILY 60 packet 3   dicyclomine (BENTYL) 10 MG capsule Take 1 capsule by mouth twice daily 180 capsule 0   diphenoxylate-atropine (LOMOTIL) 2.5-0.025 MG tablet TAKE 1 TABLET BY MOUTH TWICE DAILY AS NEEDED FOR DIARRHEA OR LOOSE STOOLS 60 tablet 3   DULoxetine (CYMBALTA) 20 MG capsule Take 1 capsule (20 mg total) by mouth daily. 30 capsule 3   fexofenadine (ALLEGRA) 180 MG tablet Take 180 mg by mouth daily.     furosemide (LASIX) 20 MG tablet Take 1 tablet by mouth once daily 90 tablet 0   IRON PO Take 65 mg by mouth 3 (three) times a week.     metoprolol succinate (TOPROL-XL) 25 MG 24 hr tablet Take 1 tablet (25 mg total) by mouth daily. 90 tablet 3   potassium chloride SA (KLOR-CON) 20 MEQ tablet Take 1 tablet (20 mEq total) by mouth daily. 90 tablet 1   Vitamin D, Ergocalciferol, (DRISDOL) 1.25 MG (50000 UNIT) CAPS capsule Take 1 capsule by mouth once a week 12 capsule 0   famotidine (PEPCID) 20 MG tablet Take 1 tablet by mouth once daily 90 tablet 0   losartan (COZAAR) 100 MG tablet Take 1 tablet (100 mg total) by mouth daily. 90 tablet 3  No facility-administered medications prior to visit.    ROS See HPI  Objective:  BP (!) 100/58 (BP Location: Right Arm, Patient Position: Sitting, Cuff Size: Normal)    Pulse 63    Temp (!) 97.4 F (36.3 C) (Temporal)    Ht 5\' 1"  (1.549 m)    Wt 105 lb 3.2 oz (47.7 kg)    SpO2 98%    BMI 19.88 kg/m   Physical Exam Vitals reviewed.  Cardiovascular:     Rate and Rhythm: Normal rate.     Pulses: Normal pulses.  Pulmonary:     Effort: Pulmonary effort is normal.  Musculoskeletal:     Cervical back: Normal range of motion and neck supple.     Right lower leg: No edema.     Left lower leg: No edema.  Lymphadenopathy:     Cervical: No cervical  adenopathy.  Neurological:     Mental Status: She is oriented to person, place, and time.  Psychiatric:        Mood and Affect: Mood normal.        Behavior: Behavior normal.        Thought Content: Thought content normal.   Assessment & Plan:  This visit occurred during the SARS-CoV-2 public health emergency.  Safety protocols were in place, including screening questions prior to the visit, additional usage of staff PPE, and extensive cleaning of exam room while observing appropriate contact time as indicated for disinfecting solutions.   Joud was seen today for follow-up.  Diagnoses and all orders for this visit:  Essential hypertension -     Comprehensive metabolic panel -     losartan (COZAAR) 50 MG tablet; Take 1 tablet (50 mg total) by mouth daily. -     CBC  Bilateral hearing loss, unspecified hearing loss type -     Ambulatory referral to Audiology  Mixed hyperlipidemia -     TSH -     Lipid panel  Hyperglycemia -     Hemoglobin A1c  Dysphagia, unspecified type -     food thickener (RESOURCE THICKENUP CLEAR) POWD; Take 1 g by mouth as needed.  Postnasal drip -     fluticasone (FLONASE) 50 MCG/ACT nasal spray; Place 1 spray into both nostrils 2 (two) times daily. -     sodium chloride (OCEAN) 0.65 % SOLN nasal spray; Place 1 spray into both nostrils as needed for congestion.  Gastroesophageal reflux disease with esophagitis without hemorrhage -     famotidine (PEPCID) 20 MG tablet; Take 1 tablet (20 mg total) by mouth 2 (two) times daily.  Esophageal dysmotilities    Problem List Items Addressed This Visit       Cardiovascular and Mediastinum   Essential hypertension - Primary    SBP at home: 110 Reports fatigue with new GE:[id number] in last 2weeks. BP Readings from Last 3 Encounters:  12/28/21 (!) 100/58  12/14/21 (!) 146/82  11/08/21 134/62   We discussed decreasing amlodipine dose to 5mg , but she decided to decrease losartan dose to 50mg  instead,  because she already has 50mg  tabs at home. New losartan rx sent. Advised to monitor BP at home 1-2x/week Call office if >140/80 or <100/60      Relevant Medications   losartan (COZAAR) 50 MG tablet   Other Relevant Orders   Comprehensive metabolic panel   CBC     Digestive   Dysphagia   Relevant Medications   food thickener (RESOURCE THICKENUP CLEAR) POWD   Esophageal dysmotilities  Reports strangulation and coughing with thin liquids only. Also experiences hoarseness (waxing and waning through out the day). Associated with post nasal drainage which is worse in AM. She is no interested in repeat swallow eval and/or refe to GI at this time. We discussed use of food thickener in thin liquids. Advised to start with honer consistency. Increase famotidine to 20mg  BID Did not increase allegra dose due to risk of increase sedation. Consider modified swallow eval if no improvement F/up in 9months or sooner      GERD with esophagitis   Relevant Medications   famotidine (PEPCID) 20 MG tablet     Nervous and Auditory   Hearing loss   Relevant Orders   Ambulatory referral to Audiology     Other   Hyperglycemia    Repeat hgbA1c      Relevant Orders   Hemoglobin A1c   Hyperlipidemia    Repeat lipid panel      Relevant Medications   losartan (COZAAR) 50 MG tablet   Other Relevant Orders   TSH   Lipid panel   Other Visit Diagnoses     Postnasal drip       Relevant Medications   fluticasone (FLONASE) 50 MCG/ACT nasal spray   sodium chloride (OCEAN) 0.65 % SOLN nasal spray       Follow-up: Return in about 3 months (around 03/28/2022) for HTN and hyperlipidemia.  Wilfred Lacy, NP

## 2021-12-28 NOTE — Assessment & Plan Note (Signed)
Repeat lipid panel ?

## 2021-12-28 NOTE — Patient Instructions (Signed)
Go to lab for blood draw  Use flonase and saline for nasal congestion Continue allegra at current dose  Increase famotidine to 20mg  BID  Use food thickener as directed on can. If dysphagia does not improve, call office for swallow eval test.  Decrease losartan dose to 50mg  daily Maintain amlodipine, furosemide and metoprolol dose. Monitor BP 1-2x/week. Call office if BP >140/80 or <100/70.  Ok to schedule appt with [facility].

## 2022-01-13 NOTE — Progress Notes (Signed)
[place] Phone: [phone] Subjective:   Susan [name], [name]  This visit occurred during the SARS-CoV-2 public health emergency.  Safety protocols were in place, including screening questions prior to the visit, additional usage of staff PPE, and extensive cleaning of exam room while observing appropriate contact time as indicated for disinfecting solutions.    I'm seeing this patient by the request  of:  Nche, Susan Brooke, NP  CC: Left knee pain  NIO:[id number]  12/14/2021 Patient does have significant atrophy of the hips.  We will monitor.  May consider the possibility of another injection but will hold at the moment.  We will see how patient responds to the low-dose of the Cymbalta.  Patient's pain of the left knee seems to be more anterior.  Does have some arthritic changes noted.  Given injection today and tolerated the procedure well.  He could be a candidate for viscosupplementation.  Does have some instability noted with valgus and varus force.  Due to patient's age she would not be a surgical candidate.  Patient does not have any significant calf pain. Of her fibromyalgia.  [name] significant increase in swelling of the lower tenderness.  Discussed with patient mother that if any signs of these to seek medical attention.  Follow-up with me again in 4 to 6 weeks.  Patient has had significant difficulty with the pain everywhere.  Patient is a 72 gabapentin activities.  Patient states that prednisone does help with sleep but do not want to do this regularly.  Do feel that fibromyalgia as well as the underlying arthritis could be contributing.  There is evidence of Cymbalta helping.  We will start at a very low dose.  Patient does have some underlying anxiety that this may help with his well.  Warned of potential side effects.  Patient given a handout as well and will discuss  with family.  We will send note to primary care provider to keep her also knowledgeable.  Follow-up with me again in 6 to 8 weeks  Updated 01/16/2022 Susan [name] is a 88 y.o. female coming in with complaint of L knee pain. Patient states that the injection helped and her knee is doing better. Two keeps ago patient got up out of chair and she felt like she twisted L knee, hip and ankle. Pain has improved over past 2 days. Continues to use Cymbalta. Was feeling fatigued at first as well as lowered her BP.        Past Medical History:  Diagnosis Date   Actinic keratosis, hx of    chest wall (2012)   Allergic rhinitis    Anxiety    Arthritis    "spine, hips" (01/30/2017)   Bursitis    Cellulitis of right foot 01/30/2017   Colon polyps    Diverticulosis    Esophageal dysmotilities 10/16/2017   Fibromyalgia    "qwhere" (01/30/2017)   Gallstones    GERD (gastroesophageal reflux disease)    High cholesterol    History of kidney stones    History of stomach ulcers    Hx of squamous cell carcinoma excision    right mandible (2011), right arm (2014)   Hypertension    IBS (irritable bowel syndrome)    Osteoarthritis    Pneumonia 2000s X 2   "twice" (01/30/2017)   Pyloric stenosis    Stroke ([place]) 10/2014   denies residual  on 01/30/2017   Past Surgical History:  Procedure Laterality Date   APPENDECTOMY     CARDIOVASCULAR STRESS TEST  04/2016   nuclear cardiolite stress test done in [place] ([name] ischemia, [name] LVH, [name] LV systolic function)   CATARACT EXTRACTION W/ INTRAOCULAR LENS  IMPLANT, BILATERAL Bilateral    CHOLECYSTECTOMY OPEN     COLONOSCOPY  2009, 2014   DIAGNOSTIC MAMMOGRAM  2009, 2012   TONSILLECTOMY     Social History   Socioeconomic History   Marital status: Widowed    Spouse name: Not on file   Number of children: 4   Years of education: Not on file   Highest education level: Not on file  Occupational History   Occupation: retired  Tobacco Use   Smoking status:  Never   Smokeless tobacco: Never  Vaping Use   Vaping Use: Never used  Substance and Sexual Activity   Alcohol use: Yes    Comment: glass of red wine maybe once week   Drug use: [name]   Sexual activity: Not on file  Other Topics Concern   Not on file  Social History Narrative   Not on file   Social Determinants of Health   Financial Resource Strain: Low Risk    Difficulty of Paying Living Expenses: Not hard at all  Food Insecurity: [name] Food Insecurity   Worried About Charity fundraiser in the Last Year: Never true   [place] in the Last Year: Never true  Transportation Needs: Unknown   Lack of Transportation (Medical): Not on file   Lack of Transportation (Non-Medical): [name]  Physical Activity: Insufficiently Active   Days of Exercise per Week: 3 days   Minutes of Exercise per Session: 20 min  Stress: [name] Stress Concern Present   Feeling of Stress : Not at all  Social Connections: Moderately Isolated   Frequency of Communication with Friends and Family: Three times a week   Frequency of Social Gatherings with Friends and Family: Twice a week   Attends Religious Services: 1 to 4 times per year   Active Member of [employer] or Organizations: [name]   Attends Archivist Meetings: Never   Marital Status: Widowed   Allergies  Allergen Reactions   Butrans [Buprenorphine] Other (See Comments)    Severe chest pains   Codeine Other (See Comments)    Severe Chest pains   Morphine And Related Other (See Comments)    Severe chest pains   Family History  Problem Relation Age of Onset   Stroke Mother    Heart attack Mother    Stroke Father    Heart attack Father    Heart attack Maternal Grandmother    Stroke Maternal Grandmother    Stroke Paternal Grandmother    Heart attack Paternal Grandmother    Heart attack Maternal Grandfather    Stroke Maternal Grandfather    Stroke Paternal Grandfather    Heart attack Paternal Grandfather    Colon cancer Neg Hx    Pancreatic  cancer Neg Hx    Stomach cancer Neg Hx    Esophageal cancer Neg Hx      Current Outpatient Medications (Cardiovascular):    amLODipine (NORVASC) 10 MG tablet, Take 1 tablet (10 mg total) by mouth at bedtime.   cholestyramine (QUESTRAN) 4 g packet, DISSOLVE & TAKE 1 POWDER BY MOUTH TWICE DAILY   furosemide (LASIX) 20 MG tablet, Take 1 tablet by mouth once daily   losartan (COZAAR) 50 MG tablet,  Take 1 tablet (50 mg total) by mouth daily.   metoprolol succinate (TOPROL-XL) 25 MG 24 hr tablet, Take 1 tablet (25 mg total) by mouth daily.  Current Outpatient Medications (Respiratory):    fexofenadine (ALLEGRA) 180 MG tablet, Take 180 mg by mouth daily.   fluticasone (FLONASE) 50 MCG/ACT nasal spray, Place 1 spray into both nostrils 2 (two) times daily.   sodium chloride (OCEAN) 0.65 % SOLN nasal spray, Place 1 spray into both nostrils as needed for congestion.  Current Outpatient Medications (Analgesics):    acetaminophen (TYLENOL) 650 MG CR tablet, Take 650 mg by mouth every 8 (eight) hours as needed for pain.   aspirin EC 325 MG tablet, Take 325 mg by mouth daily.  Current Outpatient Medications (Hematological):    IRON PO, Take 65 mg by mouth 3 (three) times a week.  Current Outpatient Medications (Other):    ALPRAZolam (XANAX) 0.5 MG tablet, Take 1 tablet (0.5 mg total) by mouth daily as needed for anxiety.   dicyclomine (BENTYL) 10 MG capsule, Take 1 capsule by mouth twice daily   diphenoxylate-atropine (LOMOTIL) 2.5-0.025 MG tablet, TAKE 1 TABLET BY MOUTH TWICE DAILY AS NEEDED FOR DIARRHEA OR LOOSE STOOLS   DULoxetine (CYMBALTA) 20 MG capsule, Take 1 capsule (20 mg total) by mouth daily.   famotidine (PEPCID) 20 MG tablet, Take 1 tablet (20 mg total) by mouth 2 (two) times daily.   food thickener (RESOURCE THICKENUP CLEAR) POWD, Take 1 g by mouth as needed.   potassium chloride SA (KLOR-CON) 20 MEQ tablet, Take 1 tablet (20 mEq total) by mouth daily.   Vitamin D, Ergocalciferol,  (DRISDOL) 1.25 MG (50000 UNIT) CAPS capsule, Take 1 capsule by mouth once a week   Reviewed prior external information including notes and imaging from  primary care provider this includes patient's most recent office visit for hypotension and January 25 As well as notes that were available from care everywhere and other healthcare systems.  Past medical history, social, surgical and family history all reviewed in electronic medical record.  [name] pertanent information unless stated regarding to the chief complaint.   Review of Systems:  [name] headache, visual changes, nausea, vomiting, diarrhea, constipation, dizziness, abdominal pain, skin rash, fevers, chills, night sweats, weight loss, swollen lymph nodes,, joint swelling, chest pain, shortness of breath, mood changes. POSITIVE muscle aches, body aches  Objective  Blood pressure 132/68, pulse 77, height 5\' 1"  (1.549 m), weight 106 lb (48.1 kg), SpO2 96 %.   General: [name] apparent distress alert and oriented x3 mood and affect normal, dressed appropriately.  HEENT: Pupils equal, extraocular movements intact  Respiratory: Patient's speak in full sentences and does not appear short of breath  Cardiovascular: Trace lower extremity edema, non tender, [name] erythema  Gait antalgic walking with the aid of a walker Left knee exam does have significant arthritic changes noted.  Trace effusion noted.  Lacks last 5 degrees of extension.  Patient continues to have tenderness to palpation in multiple areas of the back as well.   Impression and Recommendations:    The above documentation has been reviewed and is accurate and complete Lyndal Pulley, DO

## 2022-01-16 ENCOUNTER — Other Ambulatory Visit: Payer: Self-pay

## 2022-01-16 ENCOUNTER — Ambulatory Visit (INDEPENDENT_AMBULATORY_CARE_PROVIDER_SITE_OTHER): Payer: Medicare Other | Admitting: Family Medicine

## 2022-01-16 ENCOUNTER — Encounter: Payer: Self-pay | Admitting: Family Medicine

## 2022-01-16 DIAGNOSIS — M1712 Unilateral primary osteoarthritis, left knee: Secondary | ICD-10-CM

## 2022-01-16 DIAGNOSIS — I10 Essential (primary) hypertension: Secondary | ICD-10-CM

## 2022-01-16 NOTE — Assessment & Plan Note (Signed)
Patient has had difficulty with her hypertension previously.  I do believe that with patient on the Cymbalta patient is having improvement in pain levels which could be causing patient to have improvement in her blood pressure.  Today was 035 systolically though.  Patient has recently had low blood pressure as well.  Told her we would reach out to primary care provider to see if they want to change the amlodipine or keep it at the 10 mg.  Patient seemed to understand but may have some mild dementia as well.  Patient will follow-up again 6 weeks.  Total time with patient greater than 31 minutes

## 2022-01-16 NOTE — Assessment & Plan Note (Signed)
He does have the arthritic changes noted to the knee.  Patient did respond well to the steroid injection.  Patient does want to hold on the viscosupplementation but can do at follow-up if necessary.  Check in again in 6 weeks.

## 2022-01-16 NOTE — Patient Instructions (Signed)
See me in 6 weeks  I'll check with PCP about amoldopine

## 2022-01-20 ENCOUNTER — Telehealth: Payer: Self-pay | Admitting: Nurse Practitioner

## 2022-01-20 NOTE — Telephone Encounter (Signed)
Please ask Ms. Borromeo to provide home BP readings in last 2weeks. I needs these readings to determine if is it appropriate to decrease BP med doses. Thank you

## 2022-01-20 NOTE — Telephone Encounter (Signed)
-----   Message from Lyndal Pulley, DO sent at 01/16/2022  1:17 PM EST ----- Hey! Hope all is well.  Ms. Finnicum is still concern with her blood pressure, seems ok to me but she was looking to decrease more of her medicine and I brought up the idea of 1/2 the amlodipine but told her I wanted to check with you. She seems asymptomatic at moment so maybe leave it butcould go down as well. I will leave the ball in your court! Thanks [place]

## 2022-01-23 NOTE — Telephone Encounter (Signed)
Patient notified and verbalized understanding Pt states she has not been keeping a record of her readings but will start and mail in the readings in two weeks.

## 2022-01-24 DIAGNOSIS — H903 Sensorineural hearing loss, bilateral: Secondary | ICD-10-CM | POA: Diagnosis not present

## 2022-02-05 ENCOUNTER — Other Ambulatory Visit: Payer: Self-pay | Admitting: Nurse Practitioner

## 2022-02-05 DIAGNOSIS — K529 Noninfective gastroenteritis and colitis, unspecified: Secondary | ICD-10-CM

## 2022-02-06 NOTE — Telephone Encounter (Signed)
Chart supports Rx ?Last seen 012023 ?Next OV 04/2022 ?

## 2022-02-14 ENCOUNTER — Telehealth: Payer: Self-pay | Admitting: Nurse Practitioner

## 2022-02-14 DIAGNOSIS — I10 Essential (primary) hypertension: Secondary | ICD-10-CM

## 2022-02-14 MED ORDER — AMLODIPINE BESYLATE 5 MG PO TABS: 5.0000 mg | ORAL_TABLET | Freq: Every day | ORAL | 3 refills | Status: DC

## 2022-02-14 NOTE — Telephone Encounter (Signed)
LVM for patient to return call. 

## 2022-02-14 NOTE — Telephone Encounter (Signed)
Home BP readings sent: 120/64, 112/66, 122/86, 146/84, 96/82, 120/63, 127/67, 121/64, 126/66, 89/53, 118/62, 132/70, 137/72, 121/67. ? ?Need to decrease amlodipine to '5mg'$  daily ?Maintain other medication doses ?Continue BP check 2-3x/week in AM. ?Maintain upcoming appt in May ?Bring BP readings ?

## 2022-02-15 ENCOUNTER — Other Ambulatory Visit: Payer: Self-pay | Admitting: Family Medicine

## 2022-02-15 ENCOUNTER — Other Ambulatory Visit: Payer: Self-pay | Admitting: Nurse Practitioner

## 2022-02-15 DIAGNOSIS — I10 Essential (primary) hypertension: Secondary | ICD-10-CM

## 2022-02-15 DIAGNOSIS — I872 Venous insufficiency (chronic) (peripheral): Secondary | ICD-10-CM

## 2022-02-16 ENCOUNTER — Ambulatory Visit: Payer: Self-pay

## 2022-02-16 NOTE — Chronic Care Management (AMB) (Signed)
?  Care Management  ? ?Outreach Note ? ?02/16/2022 ?Name: Susan [name] MRN: [id number] DOB: [birth year] ? ?Referred by: Flossie Buffy, NP ?Reason for referral : Care Coordination ? ? ?Successful contact made with patient. HIPAA verified.  Patient verbally agreed to follow up with RNCM.  ?Patient was given information about case management services today including:  ?Care management services include personalized support from designated clinical staff supervised by her physician, including individualized plan of care and coordination with other care providers ?24/7 contact phone numbers for assistance for urgent and routine care needs. ?The patient may stop case management services at any time by phone call to the office staff. ? ?Follow Up Plan:  ?The patient has been provided with contact information for the care management team and has been advised to call with any health related questions or concerns.  ?The care management team will reach out to the patient again over the next 2 weeks .  ? ?Quinn Plowman RN,BSN,CCM ?RN Case Manager ?[place] ?[phone] ? ? ? ?

## 2022-02-21 NOTE — Progress Notes (Signed)
This encounter was created in error - please disregard.

## 2022-02-24 NOTE — Progress Notes (Signed)
?[name] D.O. ?[facility] ?[place] ?Phone: [phone] ?Subjective:   ?I, [name], am serving as a scribe for Dr. Hulan Saas.This visit occurred during the SARS-CoV-2 public health emergency.  Safety protocols were in place, including screening questions prior to the visit, additional usage of staff PPE, and extensive cleaning of exam room while observing appropriate contact time as indicated for disinfecting solutions.  ? ? ?I'm seeing this patient by the request  of:  Nche, Charlene Brooke, NP ? ?CC: knee pain  ? ?MAU:[id number]  ?01/16/2022 ?Patient has had difficulty with her hypertension previously.  I do believe that with patient on the Cymbalta patient is having improvement in pain levels which could be causing patient to have improvement in her blood pressure.  Today was 638 systolically though.  Patient has recently had low blood pressure as well.  Told her we would reach out to primary care provider to see if they want to change the amlodipine or keep it at the 10 mg.  Patient seemed to understand but may have some mild dementia as well.  Patient will follow-up again 6 weeks.  Total time with patient greater than 31 minutes ? ?He does have the arthritic changes noted to the knee.  Patient did respond well to the steroid injection.  Patient does want to hold on the viscosupplementation but can do at follow-up if necessary.  Check in again in 6 weeks. ? ?Updated 02/27/2022 ?Susan [name] is a 88 y.o. female coming in with complaint of L knee pain. Patient states that Cymbalta has helped alleviate pain throughout her body.  ? ? ? ?  ? ?Past Medical History:  ?Diagnosis Date  ? Actinic keratosis, hx of   ? chest wall (2012)  ? Allergic rhinitis   ? Anxiety   ? Arthritis   ? "spine, hips" (01/30/2017)  ? Bursitis   ? Cellulitis of right foot 01/30/2017  ? Colon polyps   ? Diverticulosis   ? Esophageal dysmotilities 10/16/2017  ? Fibromyalgia   ? "qwhere"  (01/30/2017)  ? Gallstones   ? GERD (gastroesophageal reflux disease)   ? High cholesterol   ? History of kidney stones   ? History of stomach ulcers   ? Hx of squamous cell carcinoma excision   ? right mandible (2011), right arm (2014)  ? Hypertension   ? IBS (irritable bowel syndrome)   ? Osteoarthritis   ? Pneumonia 2000s X 2  ? "twice" (01/30/2017)  ? Pyloric stenosis   ? Stroke [facility]) 10/2014  ? denies residual on 01/30/2017  ? ?Past Surgical History:  ?Procedure Laterality Date  ? APPENDECTOMY    ? CARDIOVASCULAR STRESS TEST  04/2016  ? nuclear cardiolite stress test done in [place] (no ischemia, no LVH, no LV systolic function)  ? CATARACT EXTRACTION W/ INTRAOCULAR LENS  IMPLANT, BILATERAL Bilateral   ? CHOLECYSTECTOMY OPEN    ? COLONOSCOPY  2009, 2014  ? DIAGNOSTIC MAMMOGRAM  2009, 2012  ? TONSILLECTOMY    ? ?Social History  ? ?Socioeconomic History  ? Marital status: Widowed  ?  Spouse name: Not on file  ? Number of children: 4  ? Years of education: Not on file  ? Highest education level: Not on file  ?Occupational History  ? Occupation: retired  ?Tobacco Use  ? Smoking status: Never  ? Smokeless tobacco: Never  ?Vaping Use  ? Vaping Use: Never used  ?Substance and Sexual Activity  ? Alcohol use: Yes  ?  Comment: glass of red wine maybe once week  ? Drug use: No  ? Sexual activity: Not on file  ?Other Topics Concern  ? Not on file  ?Social History Narrative  ? Not on file  ? ?Social Determinants of Health  ? ?Financial Resource Strain: Low Risk   ? Difficulty of Paying Living Expenses: Not hard at all  ?Food Insecurity: No Food Insecurity  ? Worried About Charity fundraiser in the Last Year: Never true  ? Ran Out of Food in the Last Year: Never true  ?Transportation Needs: Unknown  ? Lack of Transportation (Medical): Not on file  ? Lack of Transportation (Non-Medical): No  ?Physical Activity: Insufficiently Active  ? Days of Exercise per Week: 3 days  ? Minutes of Exercise per Session: 20 min  ?Stress: No  Stress Concern Present  ? Feeling of Stress : Not at all  ?Social Connections: Moderately Isolated  ? Frequency of Communication with Friends and Family: Three times a week  ? Frequency of Social Gatherings with Friends and Family: Twice a week  ? Attends Religious Services: 1 to 4 times per year  ? Active Member of Clubs or Organizations: No  ? Attends Archivist Meetings: Never  ? Marital Status: Widowed  ? ?Allergies  ?Allergen Reactions  ? Butrans [Buprenorphine] Other (See Comments)  ?  Severe chest pains  ? Codeine Other (See Comments)  ?  Severe Chest pains  ? Morphine And Related Other (See Comments)  ?  Severe chest pains  ? ?Family History  ?Problem Relation Age of Onset  ? Stroke Mother   ? Heart attack Mother   ? Stroke Father   ? Heart attack Father   ? Heart attack Maternal Grandmother   ? Stroke Maternal Grandmother   ? Stroke Paternal Grandmother   ? Heart attack Paternal Grandmother   ? Heart attack Maternal Grandfather   ? Stroke Maternal Grandfather   ? Stroke Paternal Grandfather   ? Heart attack Paternal Grandfather   ? Colon cancer Neg Hx   ? Pancreatic cancer Neg Hx   ? Stomach cancer Neg Hx   ? Esophageal cancer Neg Hx   ? ? ? ?Current Outpatient Medications (Cardiovascular):  ?  amLODipine (NORVASC) 5 MG tablet, Take 1 tablet (5 mg total) by mouth at bedtime. ?  cholestyramine (QUESTRAN) 4 g packet, DISSOLVE & TAKE 1 POWDER BY MOUTH TWICE DAILY ?  furosemide (LASIX) 20 MG tablet, Take 1 tablet by mouth once daily ?  losartan (COZAAR) 50 MG tablet, Take 1 tablet (50 mg total) by mouth daily. ?  metoprolol succinate (TOPROL-XL) 25 MG 24 hr tablet, Take 1 tablet (25 mg total) by mouth daily. ? ?Current Outpatient Medications (Respiratory):  ?  fexofenadine (ALLEGRA) 180 MG tablet, Take 180 mg by mouth daily. ?  fluticasone (FLONASE) 50 MCG/ACT nasal spray, Place 1 spray into both nostrils 2 (two) times daily. ?  sodium chloride (OCEAN) 0.65 % SOLN nasal spray, Place 1 spray into both  nostrils as needed for congestion. ? ?Current Outpatient Medications (Analgesics):  ?  acetaminophen (TYLENOL) 650 MG CR tablet, Take 650 mg by mouth every 8 (eight) hours as needed for pain. ?  aspirin EC 325 MG tablet, Take 325 mg by mouth daily. ? ?Current Outpatient Medications (Hematological):  ?  IRON PO, Take 65 mg by mouth 3 (three) times a week. ? ?Current Outpatient Medications (Other):  ?  ALPRAZolam (XANAX) 0.5 MG tablet, Take 1 tablet (  0.5 mg total) by mouth daily as needed for anxiety. ?  dicyclomine (BENTYL) 10 MG capsule, TAKE 1 CAPSULE BY MOUTH TWICE A DAY ?  diphenoxylate-atropine (LOMOTIL) 2.5-0.025 MG tablet, TAKE 1 TABLET BY MOUTH TWICE DAILY AS NEEDED FOR DIARRHEA OR LOOSE STOOLS ?  DULoxetine (CYMBALTA) 20 MG capsule, Take 1 capsule (20 mg total) by mouth daily. ?  famotidine (PEPCID) 20 MG tablet, Take 1 tablet (20 mg total) by mouth 2 (two) times daily. ?  food thickener (RESOURCE THICKENUP CLEAR) POWD, Take 1 g by mouth as needed. ?  potassium chloride SA (KLOR-CON) 20 MEQ tablet, Take 1 tablet (20 mEq total) by mouth daily. ?  Vitamin D, Ergocalciferol, (DRISDOL) 1.25 MG (50000 UNIT) CAPS capsule, Take 1 capsule by mouth once a week ? ? ?Review of Systems: ? No headache, visual changes, nausea, vomiting, diarrhea, constipation, dizziness, abdominal pain, skin rash, fevers, chills, night sweats, weight loss, swollen lymph nodes, chest pain, shortness of breath, mood changes. POSITIVE muscle aches, joint swelling, body aches ? ?Objective  ?Blood pressure (!) 142/74, pulse 75, height '5\' 1"'$  (1.549 m), weight 106 lb (48.1 kg), SpO2 96 %. ?  ?General: No apparent distress alert and oriented x3 mood and affect normal, dressed appropriately.  ?HEENT: Pupils equal, extraocular movements intact  ?Respiratory: Patient's speak in full sentences and does not appear short of breath  ?Cardiovascular: Trace lower extremity edema, mild tender, no erythema  ?Gait severely antalgic walking with the aid of a  walker ?MSK: Left knee does have arthritic changes noted.  Patient does have bruising noted surrounding the knee as well.  Instability noted with valgus and varus force ? ?After informed written and verbal c

## 2022-02-27 ENCOUNTER — Encounter: Payer: Self-pay | Admitting: Family Medicine

## 2022-02-27 ENCOUNTER — Other Ambulatory Visit: Payer: Self-pay

## 2022-02-27 ENCOUNTER — Ambulatory Visit (INDEPENDENT_AMBULATORY_CARE_PROVIDER_SITE_OTHER): Payer: Medicare Other | Admitting: Family Medicine

## 2022-02-27 DIAGNOSIS — M797 Fibromyalgia: Secondary | ICD-10-CM | POA: Diagnosis not present

## 2022-02-27 DIAGNOSIS — M1712 Unilateral primary osteoarthritis, left knee: Secondary | ICD-10-CM | POA: Diagnosis not present

## 2022-02-27 NOTE — Assessment & Plan Note (Signed)
Patient does have some arthritic changes of the knee noted that are quite severe.  Patient continues want to stay relatively active. ?Patient wanted to hold on the viscosupplementation but at this point would like to continue to have it done.  Has been feeling relatively well and does feel that the Cymbalta has helped some of her pain on a regular basis.  No significant side effects other than the minorly increasing tiredness.   ?

## 2022-02-27 NOTE — Patient Instructions (Signed)
Monovisc injection today ?Can take up to one month for improvement ?See me in 2 months ?

## 2022-02-27 NOTE — Assessment & Plan Note (Signed)
Significant improvement made with the Cymbalta.  We will continue the same dose at this time.  Continue to monitor. ?

## 2022-02-28 ENCOUNTER — Telehealth: Payer: Medicare Other

## 2022-02-28 ENCOUNTER — Telehealth: Payer: Self-pay

## 2022-02-28 NOTE — Telephone Encounter (Signed)
. ?  Care Management  ? ?Follow Up Note ? ? ?02/28/2022 ?Name: Susan [name] MRN: [id number] DOB: 04/27/1924 ? ? ?Referred by: Flossie Buffy, NP ?Reason for referral : Care Coordination ? ? ?Successful contact was made with the patient to discuss care management and care coordination services. Patient declines engagement at this time.  ? ?Follow Up Plan: No further follow up required at this time. Patient was advised to call provider office if interested in care management services in the future. ? ?Quinn Plowman RN,BSN,CCM ?RN Case Manager ?[place]  ?[phone] ? ?

## 2022-03-06 ENCOUNTER — Other Ambulatory Visit: Payer: Self-pay | Admitting: Nurse Practitioner

## 2022-03-06 DIAGNOSIS — K2100 Gastro-esophageal reflux disease with esophagitis, without bleeding: Secondary | ICD-10-CM

## 2022-03-09 ENCOUNTER — Telehealth: Payer: Self-pay | Admitting: Family Medicine

## 2022-03-09 NOTE — Telephone Encounter (Signed)
She can just stop it is a low dose and no need to titrate  ?

## 2022-03-09 NOTE — Telephone Encounter (Signed)
Pt has struggled with diarrhea for a long time. She is concerned the Cymbalta is making this worse, she is disappointed as it has helped her in other ways. ? ?She stopped the Cymbalta on Mon and Tues and the diarrhea resolved. ?She restarted Cymbalta on Wednesday and the diarrhea returned. ? ?She wants to stop taking, but there are warnings about stopping this med. Can we advise her the safest way to quit this med? ?

## 2022-03-13 NOTE — Telephone Encounter (Signed)
I am so glad it is helping but diarrhea is not good.  ?Unfortunately only comes in a capsule and not a tablet.  ?Ideas would be to try it just every other day and see if that helps.  ?Not a huge fan but could take imodium with it as well  ?

## 2022-03-31 ENCOUNTER — Other Ambulatory Visit: Payer: Self-pay | Admitting: Nurse Practitioner

## 2022-03-31 DIAGNOSIS — F418 Other specified anxiety disorders: Secondary | ICD-10-CM

## 2022-04-03 ENCOUNTER — Encounter: Payer: Self-pay | Admitting: Nurse Practitioner

## 2022-04-03 ENCOUNTER — Ambulatory Visit (INDEPENDENT_AMBULATORY_CARE_PROVIDER_SITE_OTHER): Payer: Medicare Other | Admitting: Nurse Practitioner

## 2022-04-03 VITALS — BP 116/62 | HR 84 | Temp 97.3°F | Ht 61.0 in | Wt 100.2 lb

## 2022-04-03 DIAGNOSIS — I10 Essential (primary) hypertension: Secondary | ICD-10-CM

## 2022-04-03 DIAGNOSIS — F411 Generalized anxiety disorder: Secondary | ICD-10-CM | POA: Diagnosis not present

## 2022-04-03 DIAGNOSIS — I872 Venous insufficiency (chronic) (peripheral): Secondary | ICD-10-CM | POA: Diagnosis not present

## 2022-04-03 DIAGNOSIS — R011 Cardiac murmur, unspecified: Secondary | ICD-10-CM | POA: Insufficient documentation

## 2022-04-03 MED ORDER — POTASSIUM CHLORIDE CRYS ER 20 MEQ PO TBCR: 20.0000 meq | EXTENDED_RELEASE_TABLET | Freq: Every day | ORAL | 1 refills | Status: DC

## 2022-04-03 MED ORDER — FUROSEMIDE 20 MG PO TABS: 20.0000 mg | ORAL_TABLET | Freq: Every day | ORAL | 1 refills | Status: DC

## 2022-04-03 MED ORDER — ALPRAZOLAM 0.5 MG PO TABS: 0.5000 mg | ORAL_TABLET | Freq: Every day | ORAL | 5 refills | Status: DC | PRN

## 2022-04-03 NOTE — Assessment & Plan Note (Signed)
Stable ?Maintain alprazolam 0.'5mg'$  daily ?Refill sent ?

## 2022-04-03 NOTE — Assessment & Plan Note (Addendum)
soft systolic murmur, chronic, first noted 2020, but no change in last 1yr. ?Myocardial perfusion imaging completed 2017: normal EF 78%, normal LV systolic function, no LY dilation. ?No SOB, no PND, no CP, no dizziness. ?Has chronic LE edema due to venous insufficiency. ?We discussed the need for echocardiogram. She decline imgaing at this time since she is asymptomatic ? ?

## 2022-04-03 NOTE — Patient Instructions (Addendum)
Stop amlodipine ?Maintain losartan, metoprolol and furosemide dose. ?Continue to monitor BP 2-3x/week in AM. ?Call office if you have more than 2BP readings >140/80. ? ?Ok to get zoster vaccine, second dose should be 2-82month from 1st dose. ?Send vaccine dates once completed. ?

## 2022-04-03 NOTE — Assessment & Plan Note (Signed)
Stable BP at home: 120s-110s/60s ?Current use of losartan, metroprolol and amlodipine ?We had decreased amlodipine dose from '10mg'$  to '5mg'$  6weeks ago. ?BP Readings from Last 3 Encounters:  ?04/03/22 116/62  ?02/27/22 (!) 142/74  ?01/16/22 132/68  ? ?She agreed to d/c amlodipine in order to decrease pill burden. ?Maintain losartan, metoprolol and furosemide dose. ?Continue to monitor BP 2-3x/week in AM. ?Call office if you have more than 2BP readings >140/80. ?F/up in 83month or sooner if needed ?

## 2022-04-03 NOTE — Progress Notes (Signed)
? ?             Established Patient Visit ? ?Patient: Susan [name]   DOB: 08/29/1924   88 y.o. Female  MRN: [id number] ?Visit Date: 04/03/2022 ? ?Subjective:  ?  ?Chief Complaint  ?Patient presents with  ? Follow-up  ?  3 month f/u on HTN and cholesterol.   ? ?HPI ?Essential hypertension ?Stable BP at home: 120s-110s/60s ?Current use of losartan, metroprolol and amlodipine ?We had decreased amlodipine dose from '10mg'$  to '5mg'$  6weeks ago. ?BP Readings from Last 3 Encounters:  ?04/03/22 116/62  ?02/27/22 (!) 142/74  ?01/16/22 132/68  ? ?She agreed to d/c amlodipine in order to decrease pill burden. ?Maintain losartan, metoprolol and furosemide dose. ?Continue to monitor BP 2-3x/week in AM. ?Call office if you have more than 2BP readings >140/80. ?F/up in 61month or sooner if needed ? ?Heart murmur on physical examination ?soft systolic murmur, chronic, first noted 2020, but no change in last 361yr ?Myocardial perfusion imaging completed 2017: normal EF 78%, normal LV systolic function, no LY dilation. ?No SOB, no PND, no CP, no dizziness. ?Has chronic LE edema due to venous insufficiency. ?We discussed the need for echocardiogram. She decline imgaing at this time since she is asymptomatic ? ? ?Anxiety about health ?Stable ?Maintain alprazolam 0.'5mg'$  daily ?Refill sent ? ?6lbs weight loss noted today. Ms. SpFoldsdmits to decreased nutrition. She states food at [facility] center does not taste good. She drinks a bottle of ensure 5x/week. She has access to a microwave and an ovEmergency planning/management officern her apartment. We talked about ways to make easy meals on her own. Advised to eat small snacks between meals or drink 2bottles of  ensure when appetite is not the best. She verbalized understanding. ? ?Wt Readings from Last 3 Encounters:  ?04/03/22 100 lb 3.2 oz (45.5 kg)  ?02/27/22 106 lb (48.1 kg)  ?01/16/22 106 lb (48.1 kg)  ?   ? ?  04/03/2022  ?  2:53 PM 04/03/2022  ?  2:52 PM 12/28/2021  ?  2:47 PM  ?Depression screen PHQ 2/9  ?Decreased  Interest 0 0 0  ?Down, Depressed, Hopeless 0 0 0  ?PHQ - 2 Score 0 0 0  ?Altered sleeping 0  0  ?Tired, decreased energy 0  1  ?Change in appetite 0  2  ?Feeling bad or failure about yourself  0  0  ?Trouble concentrating 0  0  ?Moving slowly or fidgety/restless 0  1  ?Suicidal thoughts 0  0  ?PHQ-9 Score 0  4  ?Difficult doing work/chores Not difficult at all  Not difficult at all  ?  ?Reviewed medical, surgical, and social history today ? ?Medications: ?Outpatient Medications Prior to Visit  ?Medication Sig  ? acetaminophen (TYLENOL) 650 MG CR tablet Take 650 mg by mouth every 8 (eight) hours as needed for pain.  ? amLODipine (NORVASC) 5 MG tablet Take 1 tablet (5 mg total) by mouth at bedtime.  ? aspirin EC 325 MG tablet Take 325 mg by mouth daily.  ? cholestyramine (QUESTRAN) 4 g packet DISSOLVE & TAKE 1 POWDER BY MOUTH TWICE DAILY  ? dicyclomine (BENTYL) 10 MG capsule TAKE 1 CAPSULE BY MOUTH TWICE A DAY  ? diphenoxylate-atropine (LOMOTIL) 2.5-0.025 MG tablet TAKE 1 TABLET BY MOUTH TWICE DAILY AS NEEDED FOR DIARRHEA OR LOOSE STOOLS  ? famotidine (PEPCID) 20 MG tablet Take 1 tablet by mouth once daily  ? fexofenadine (ALLEGRA) 180 MG tablet Take 180 mg by mouth daily.  ?  fluticasone (FLONASE) 50 MCG/ACT nasal spray Place 1 spray into both nostrils 2 (two) times daily.  ? food thickener (RESOURCE THICKENUP CLEAR) POWD Take 1 g by mouth as needed.  ? IRON PO Take 65 mg by mouth 3 (three) times a week.  ? losartan (COZAAR) 50 MG tablet Take 1 tablet (50 mg total) by mouth daily.  ? metoprolol succinate (TOPROL-XL) 25 MG 24 hr tablet Take 1 tablet (25 mg total) by mouth daily.  ? sodium chloride (OCEAN) 0.65 % SOLN nasal spray Place 1 spray into both nostrils as needed for congestion.  ? Vitamin D, Ergocalciferol, (DRISDOL) 1.25 MG (50000 UNIT) CAPS capsule Take 1 capsule by mouth once a week  ? [DISCONTINUED] ALPRAZolam (XANAX) 0.5 MG tablet Take 1 tablet (0.5 mg total) by mouth daily as needed for anxiety.  ?  [DISCONTINUED] DULoxetine (CYMBALTA) 20 MG capsule Take 1 capsule (20 mg total) by mouth daily.  ? [DISCONTINUED] furosemide (LASIX) 20 MG tablet Take 1 tablet by mouth once daily  ? [DISCONTINUED] potassium chloride SA (KLOR-CON) 20 MEQ tablet Take 1 tablet (20 mEq total) by mouth daily.  ? DULoxetine (CYMBALTA) 20 MG capsule Take 1 capsule (20 mg total) by mouth every other day.  ? ?No facility-administered medications prior to visit.  ? ?Reviewed past medical and social history.  ? ?ROS per HPI above ? ? ?   ?Objective:  ?BP 116/62 (BP Location: Right Arm, Patient Position: Sitting, Cuff Size: Normal)   Pulse 84   Temp (!) 97.3 ?F (36.3 ?C) (Temporal)   Ht '5\' 1"'$  (1.549 m)   Wt 100 lb 3.2 oz (45.5 kg)   SpO2 96%   BMI 18.93 kg/m?  ? ?  ? ?Physical Exam ?Cardiovascular:  ?   Rate and Rhythm: Normal rate and regular rhythm.  ?   Pulses: Normal pulses.  ?   Heart sounds: Murmur heard.  ?Pulmonary:  ?   Effort: Pulmonary effort is normal.  ?   Breath sounds: Normal breath sounds.  ?Abdominal:  ?   General: Bowel sounds are normal. There is no distension.  ?   Palpations: Abdomen is soft.  ?   Tenderness: There is no abdominal tenderness. There is no guarding.  ?Musculoskeletal:  ?   Right lower leg: No edema.  ?   Left lower leg: No edema.  ?Neurological:  ?   Mental Status: She is alert and oriented to person, place, and time.  ?Psychiatric:     ?   Behavior: Behavior normal.     ?   Thought Content: Thought content normal.  ?  ?No results found for any visits on 04/03/22. ?   ?Assessment & Plan:  ?  ?Problem List Items Addressed This Visit   ? ?  ? Cardiovascular and Mediastinum  ? Essential hypertension - Primary  ?  Stable BP at home: 120s-110s/60s ?Current use of losartan, metroprolol and amlodipine ?We had decreased amlodipine dose from '10mg'$  to '5mg'$  6weeks ago. ?BP Readings from Last 3 Encounters:  ?04/03/22 116/62  ?02/27/22 (!) 142/74  ?01/16/22 132/68  ? ?She agreed to d/c amlodipine in order to decrease  pill burden. ?Maintain losartan, metoprolol and furosemide dose. ?Continue to monitor BP 2-3x/week in AM. ?Call office if you have more than 2BP readings >140/80. ?F/up in 37month or sooner if needed ?  ?  ? Relevant Medications  ? furosemide (LASIX) 20 MG tablet  ? Venous insufficiency of both lower extremities  ? Relevant Medications  ? furosemide (  LASIX) 20 MG tablet  ? potassium chloride SA (KLOR-CON M) 20 MEQ tablet  ?  ? Other  ? Anxiety about health  ?  Stable ?Maintain alprazolam 0.'5mg'$  daily ?Refill sent ? ?  ?  ? Relevant Medications  ? ALPRAZolam (XANAX) 0.5 MG tablet  ? DULoxetine (CYMBALTA) 20 MG capsule  ? Heart murmur on physical examination  ?  soft systolic murmur, chronic, first noted 2020, but no change in last 78yr. ?Myocardial perfusion imaging completed 2017: normal EF 78%, normal LV systolic function, no LY dilation. ?No SOB, no PND, no CP, no dizziness. ?Has chronic LE edema due to venous insufficiency. ?We discussed the need for echocardiogram. She decline imgaing at this time since she is asymptomatic ? ? ?  ?  ? ?Return in about 3 months (around 07/04/2022) for HTN and weight loss (repeat labs). ? ?  ? ?CWilfred Lacy NP ? ? ?

## 2022-04-05 NOTE — Telephone Encounter (Signed)
Rx filled on 04/03/22 ?

## 2022-04-27 NOTE — Progress Notes (Signed)
Susan [name] [address] [place] Phone: [phone] Subjective:   Susan [name], [name]  I'm seeing this patient by the request  of:  Nche, Charlene Brooke, NP  CC: left knee pain and back pain follow up   [id number]  02/27/2022 Significant improvement made with the Cymbalta.  We will continue the same dose at this time.  Continue to monitor.  Patient does have some arthritic changes of the knee noted that are quite severe.  Patient continues want to stay relatively active. Patient wanted to hold on the viscosupplementation but at this point would like to continue to have it done.  Has been feeling relatively well and does feel that the Cymbalta has helped some of her pain on a regular basis.  No significant side effects other than the minorly increasing tiredness.    Updated 05/02/2022 Susan [name] is a 88 y.o. female coming in with complaint of left knee pain. Knee is doing much better. Very minimal pain, short and brief. Wants to talk to you about medications (Vit B6). No new complaints.       Past Medical History:  Diagnosis Date   Actinic keratosis, hx of    chest wall (2012)   Allergic rhinitis    Anxiety    Arthritis    "spine, hips" (01/30/2017)   Bursitis    Cellulitis of right foot 01/30/2017   Colon polyps    Diverticulosis    Esophageal dysmotilities 10/16/2017   Fibromyalgia    "qwhere" (01/30/2017)   Gallstones    GERD (gastroesophageal reflux disease)    High cholesterol    History of kidney stones    History of stomach ulcers    Hx of squamous cell carcinoma excision    right mandible (2011), right arm (2014)   Hypertension    IBS (irritable bowel syndrome)    Osteoarthritis    Pneumonia 2000s X 2   "twice" (01/30/2017)   Pyloric stenosis    Stroke ([place]) 10/2014   denies residual on 01/30/2017   Past Surgical History:  Procedure Laterality Date   APPENDECTOMY      CARDIOVASCULAR STRESS TEST  04/2016   nuclear cardiolite stress test done in [place] (no ischemia, no LVH, no LV systolic function)   CATARACT EXTRACTION W/ INTRAOCULAR LENS  IMPLANT, BILATERAL Bilateral    CHOLECYSTECTOMY OPEN     COLONOSCOPY  2009, 2014   DIAGNOSTIC MAMMOGRAM  2009, 2012   TONSILLECTOMY     Social History   Socioeconomic History   Marital status: Widowed    Spouse name: Not on file   Number of children: 4   Years of education: Not on file   Highest education level: Not on file  Occupational History   Occupation: retired  Tobacco Use   Smoking status: Never   Smokeless tobacco: Never  Vaping Use   Vaping Use: Never used  Substance and Sexual Activity   Alcohol use: Yes    Comment: glass of red wine maybe once week   Drug use: No   Sexual activity: Not on file  Other Topics Concern   Not on file  Social History Narrative   Not on file   Social Determinants of Health   Financial Resource Strain: Low Risk    Difficulty of Paying Living Expenses: Not hard at all  Food Insecurity: No Food Insecurity   Worried About Charity fundraiser in the  Last Year: Never true   Ran Out of Food in the Last Year: Never true  Transportation Needs: Unknown   Lack of Transportation (Medical): Not on file   Lack of Transportation (Non-Medical): No  Physical Activity: Insufficiently Active   Days of Exercise per Week: 3 days   Minutes of Exercise per Session: 20 min  Stress: No Stress Concern Present   Feeling of Stress : Not at all  Social Connections: Moderately Isolated   Frequency of Communication with Friends and Family: Three times a week   Frequency of Social Gatherings with Friends and Family: Twice a week   Attends Religious Services: 1 to 4 times per year   Active Member of [employer] or Organizations: No   Attends Archivist Meetings: Never   Marital Status: Widowed   Allergies  Allergen Reactions   Butrans [Buprenorphine] Other (See Comments)     Severe chest pains   Codeine Other (See Comments)    Severe Chest pains   Morphine And Related Other (See Comments)    Severe chest pains   Family History  Problem Relation Age of Onset   Stroke Mother    Heart attack Mother    Stroke Father    Heart attack Father    Heart attack Maternal Grandmother    Stroke Maternal Grandmother    Stroke Paternal Grandmother    Heart attack Paternal Grandmother    Heart attack Maternal Grandfather    Stroke Maternal Grandfather    Stroke Paternal Grandfather    Heart attack Paternal Grandfather    Colon cancer Neg Hx    Pancreatic cancer Neg Hx    Stomach cancer Neg Hx    Esophageal cancer Neg Hx      Current Outpatient Medications (Cardiovascular):    amLODipine (NORVASC) 5 MG tablet, Take 1 tablet (5 mg total) by mouth at bedtime.   cholestyramine (QUESTRAN) 4 g packet, DISSOLVE & TAKE 1 POWDER BY MOUTH TWICE DAILY   furosemide (LASIX) 20 MG tablet, Take 1 tablet (20 mg total) by mouth daily.   losartan (COZAAR) 50 MG tablet, Take 1 tablet (50 mg total) by mouth daily.   metoprolol succinate (TOPROL-XL) 25 MG 24 hr tablet, Take 1 tablet (25 mg total) by mouth daily.  Current Outpatient Medications (Respiratory):    fexofenadine (ALLEGRA) 180 MG tablet, Take 180 mg by mouth daily.   fluticasone (FLONASE) 50 MCG/ACT nasal spray, Place 1 spray into both nostrils 2 (two) times daily.   sodium chloride (OCEAN) 0.65 % SOLN nasal spray, Place 1 spray into both nostrils as needed for congestion.  Current Outpatient Medications (Analgesics):    acetaminophen (TYLENOL) 650 MG CR tablet, Take 650 mg by mouth every 8 (eight) hours as needed for pain.   aspirin EC 325 MG tablet, Take 325 mg by mouth daily.  Current Outpatient Medications (Hematological):    IRON PO, Take 65 mg by mouth 3 (three) times a week.  Current Outpatient Medications (Other):    ALPRAZolam (XANAX) 0.5 MG tablet, Take 1 tablet (0.5 mg total) by mouth daily as needed  for anxiety.   dicyclomine (BENTYL) 10 MG capsule, TAKE 1 CAPSULE BY MOUTH TWICE A DAY   diphenoxylate-atropine (LOMOTIL) 2.5-0.025 MG tablet, TAKE 1 TABLET BY MOUTH TWICE DAILY AS NEEDED FOR DIARRHEA OR LOOSE STOOLS   DULoxetine (CYMBALTA) 20 MG capsule, Take 1 capsule (20 mg total) by mouth every other day.   famotidine (PEPCID) 20 MG tablet, Take 1 tablet by mouth once  daily   food thickener (RESOURCE THICKENUP CLEAR) POWD, Take 1 g by mouth as needed.   potassium chloride SA (KLOR-CON M) 20 MEQ tablet, Take 1 tablet (20 mEq total) by mouth daily.   Vitamin D, Ergocalciferol, (DRISDOL) 1.25 MG (50000 UNIT) CAPS capsule, Take 1 capsule by mouth once a week    Review of Systems:  No headache, visual changes, nausea, vomiting, diarrhea, constipation, dizziness, abdominal pain, skin rash, fevers, chills, night sweats, weight loss, swollen lymph nodes, body aches, joint swelling, chest pain, shortness of breath, mood changes. POSITIVE muscle aches but improved since she has been taking Cymbalta every other day with no significant other side effects.  Objective  Blood pressure 128/82, pulse 68, height '5\' 1"'$  (1.549 m), weight 104 lb (47.2 kg), SpO2 96 %.   General: No apparent distress alert and oriented x3 mood and affect normal, dressed appropriately.  HEENT: Pupils equal, extraocular movements intact  Respiratory: Patient's speak in full sentences and does not appear short of breath  Cardiovascular: Trace lower extremity edema, non tender, no erythema  Gait and using the aid of a walker MSK: Patient's left knee no significant swelling.  Full extension noted.  Flexion to 95 degrees.    Impression and Recommendations:     The above documentation has been reviewed and is accurate and complete Lyndal Pulley, DO

## 2022-05-02 ENCOUNTER — Ambulatory Visit (INDEPENDENT_AMBULATORY_CARE_PROVIDER_SITE_OTHER): Payer: Medicare Other | Admitting: Family Medicine

## 2022-05-02 DIAGNOSIS — M1712 Unilateral primary osteoarthritis, left knee: Secondary | ICD-10-CM

## 2022-05-02 NOTE — Patient Instructions (Signed)
Glad you're doing well Keep doing duloxetine every other day  See you again in 3 months

## 2022-05-02 NOTE — Assessment & Plan Note (Signed)
Significant improvement after the viscosupplementation.  Patient can follow-up with me again in 3 months otherwise.

## 2022-05-18 ENCOUNTER — Other Ambulatory Visit: Payer: Self-pay | Admitting: Internal Medicine

## 2022-05-18 ENCOUNTER — Other Ambulatory Visit: Payer: Self-pay | Admitting: Nurse Practitioner

## 2022-05-18 ENCOUNTER — Ambulatory Visit: Payer: Medicare Other

## 2022-05-18 DIAGNOSIS — K529 Noninfective gastroenteritis and colitis, unspecified: Secondary | ICD-10-CM

## 2022-05-18 NOTE — Telephone Encounter (Signed)
Chart supports Rx Last OV: 04/2022 Next OV: 07/2022 

## 2022-05-22 ENCOUNTER — Other Ambulatory Visit: Payer: Self-pay | Admitting: Family Medicine

## 2022-05-23 ENCOUNTER — Ambulatory Visit (INDEPENDENT_AMBULATORY_CARE_PROVIDER_SITE_OTHER): Payer: Medicare Other

## 2022-05-23 DIAGNOSIS — Z0000 Encounter for general adult medical examination without abnormal findings: Secondary | ICD-10-CM | POA: Diagnosis not present

## 2022-05-23 NOTE — Patient Instructions (Signed)
Susan [name] , Thank you for taking time to come for your Medicare Wellness Visit. I appreciate your ongoing commitment to your health goals. Please review the following plan we discussed and let me know if I can assist you in the future.   Screening recommendations/referrals: Colonoscopy: no longer required  Mammogram: no longer required  Bone Density: declined  Recommended yearly ophthalmology/optometry visit for glaucoma screening and checkup Recommended yearly dental visit for hygiene and checkup  Vaccinations: Influenza vaccine: completed  Pneumococcal vaccine: completed  Tdap vaccine: due  Shingles vaccine: completed     Advanced directives: yes   Conditions/risks identified: none   Next appointment: none    Preventive Care 62 Years and Older, Female Preventive care refers to lifestyle choices and visits with your health care provider that can promote health and wellness. What does preventive care include? A yearly physical exam. This is also called an annual well check. Dental exams once or twice a year. Routine eye exams. Ask your health care provider how often you should have your eyes checked. Personal lifestyle choices, including: Daily care of your teeth and gums. Regular physical activity. Eating a healthy diet. Avoiding tobacco and drug use. Limiting alcohol use. Practicing safe sex. Taking low-dose aspirin every day. Taking vitamin and mineral supplements as recommended by your health care provider. What happens during an annual well check? The services and screenings done by your health care provider during your annual well check will depend on your age, overall health, lifestyle risk factors, and family history of disease. Counseling  Your health care provider may ask you questions about your: Alcohol use. Tobacco use. Drug use. Emotional well-being. Home and relationship well-being. Sexual activity. Eating habits. History of falls. Memory and ability  to understand (cognition). Work and work Statistician. Reproductive health. Screening  You may have the following tests or measurements: Height, weight, and BMI. Blood pressure. Lipid and cholesterol levels. These may be checked every 5 years, or more frequently if you are over 86 years old. Skin check. Lung cancer screening. You may have this screening every year starting at age 46 if you have a 30-pack-year history of smoking and currently smoke or have quit within the past 15 years. Fecal occult blood test (FOBT) of the stool. You may have this test every year starting at age 98. Flexible sigmoidoscopy or colonoscopy. You may have a sigmoidoscopy every 5 years or a colonoscopy every 10 years starting at age 46. Hepatitis C blood test. Hepatitis B blood test. Sexually transmitted disease (STD) testing. Diabetes screening. This is done by checking your blood sugar (glucose) after you have not eaten for a while (fasting). You may have this done every 1-3 years. Bone density scan. This is done to screen for osteoporosis. You may have this done starting at age 26. Mammogram. This may be done every 1-2 years. Talk to your health care provider about how often you should have regular mammograms. Talk with your health care provider about your test results, treatment options, and if necessary, the need for more tests. Vaccines  Your health care provider may recommend certain vaccines, such as: Influenza vaccine. This is recommended every year. Tetanus, diphtheria, and acellular pertussis (Tdap, Td) vaccine. You may need a Td booster every 10 years. Zoster vaccine. You may need this after age 96. Pneumococcal 13-valent conjugate (PCV13) vaccine. One dose is recommended after age 63. Pneumococcal polysaccharide (PPSV23) vaccine. One dose is recommended after age 41. Talk to your health care provider about which screenings  and vaccines you need and how often you need them. This information is not  intended to replace advice given to you by your health care provider. Make sure you discuss any questions you have with your health care provider. Document Released: 12/17/2015 Document Revised: 08/09/2016 Document Reviewed: 09/21/2015 Elsevier Interactive Patient Education  2017 [employer] Prevention in the Home Falls can cause injuries. They can happen to people of all ages. There are many things you can do to make your home safe and to help prevent falls. What can I do on the outside of my home? Regularly fix the edges of walkways and driveways and fix any cracks. Remove anything that might make you trip as you walk through a door, such as a raised step or threshold. Trim any bushes or trees on the path to your home. Use bright outdoor lighting. Clear any walking paths of anything that might make someone trip, such as rocks or tools. Regularly check to see if handrails are loose or broken. Make sure that both sides of any steps have handrails. Any raised decks and porches should have guardrails on the edges. Have any leaves, snow, or ice cleared regularly. Use sand or salt on walking paths during winter. Clean up any spills in your garage right away. This includes oil or grease spills. What can I do in the bathroom? Use night lights. Install grab bars by the toilet and in the tub and shower. Do not use towel bars as grab bars. Use non-skid mats or decals in the tub or shower. If you need to sit down in the shower, use a plastic, non-slip stool. Keep the floor dry. Clean up any water that spills on the floor as soon as it happens. Remove soap buildup in the tub or shower regularly. Attach bath mats securely with double-sided non-slip rug tape. Do not have throw rugs and other things on the floor that can make you trip. What can I do in the bedroom? Use night lights. Make sure that you have a light by your bed that is easy to reach. Do not use any sheets or blankets that are  too big for your bed. They should not hang down onto the floor. Have a firm chair that has side arms. You can use this for support while you get dressed. Do not have throw rugs and other things on the floor that can make you trip. What can I do in the kitchen? Clean up any spills right away. Avoid walking on wet floors. Keep items that you use a lot in easy-to-reach places. If you need to reach something above you, use a strong step stool that has a grab bar. Keep electrical cords out of the way. Do not use floor polish or wax that makes floors slippery. If you must use wax, use non-skid floor wax. Do not have throw rugs and other things on the floor that can make you trip. What can I do with my stairs? Do not leave any items on the stairs. Make sure that there are handrails on both sides of the stairs and use them. Fix handrails that are broken or loose. Make sure that handrails are as long as the stairways. Check any carpeting to make sure that it is firmly attached to the stairs. Fix any carpet that is loose or worn. Avoid having throw rugs at the top or bottom of the stairs. If you do have throw rugs, attach them to the floor with carpet tape.  Make sure that you have a light switch at the top of the stairs and the bottom of the stairs. If you do not have them, ask someone to add them for you. What else can I do to help prevent falls? Wear shoes that: Do not have high heels. Have rubber bottoms. Are comfortable and fit you well. Are closed at the toe. Do not wear sandals. If you use a stepladder: Make sure that it is fully opened. Do not climb a closed stepladder. Make sure that both sides of the stepladder are locked into place. Ask someone to hold it for you, if possible. Clearly mark and make sure that you can see: Any grab bars or handrails. First and last steps. Where the edge of each step is. Use tools that help you move around (mobility aids) if they are needed. These  include: Canes. Walkers. Scooters. Crutches. Turn on the lights when you go into a dark area. Replace any light bulbs as soon as they burn out. Set up your furniture so you have a clear path. Avoid moving your furniture around. If any of your floors are uneven, fix them. If there are any pets around you, be aware of where they are. Review your medicines with your doctor. Some medicines can make you feel dizzy. This can increase your chance of falling. Ask your doctor what other things that you can do to help prevent falls. This information is not intended to replace advice given to you by your health care provider. Make sure you discuss any questions you have with your health care provider. Document Released: 09/16/2009 Document Revised: 04/27/2016 Document Reviewed: 12/25/2014 Elsevier Interactive Patient Education  2017 [employer].

## 2022-05-23 NOTE — Progress Notes (Signed)
Subjective:   Susan [name] is a 88 y.o. female who presents for Medicare Annual (Subsequent) preventive examination.   I connected with' \\Blessing'$  Hanline  today by telephone and verified that I am speaking with the correct person using two identifiers. Location patient: home Location provider: work Persons participating in the virtual visit: patient, provider.   I discussed the limitations, risks, security and privacy concerns of performing an evaluation and management service by telephone and the availability of in person appointments. I also discussed with the patient that there may be a patient responsible charge related to this service. The patient expressed understanding and verbally consented to this telephonic visit.    Interactive audio and video telecommunications were attempted between this provider and patient, however failed, due to patient having technical difficulties OR patient did not have access to video capability.  We continued and completed visit with audio only.    Review of Systems     Cardiac Risk Factors include: advanced age (>15mn, >>24women)     Objective:    Today's Vitals   There is no height or weight on file to calculate BMI.     05/23/2022   11:53 AM 06/30/2021   11:33 AM 05/17/2021    1:02 PM 10/06/2020   11:55 AM 05/19/2020    8:58 PM 06/26/2019    4:59 PM 12/06/2018    9:03 AM  Advanced Directives  Does Patient Have a Medical Advance Directive? Yes Yes Yes Yes Yes Yes Yes  Type of AParamedicof ASmockLiving will Out of facility DNR (pink MOST or yellow form)  HPrentissLiving will HDrummondLiving will HCl[place]is  Does patient want to make changes to medical advance directive?     No - Patient declined  No - Patient declined  Copy of HBradleyin Chart? No - copy requested  Yes - validated most recent copy  scanned in chart (See row information)  No - copy requested, Physician notified    Would patient like information on creating a medical advance directive?     No - Patient declined    Pre-existing out of facility DNR order (yellow form or pink MOST form)       Yellow form placed in chart (order not valid for inpatient use)     Significant value    Current Medications (verified) Outpatient Encounter Medications as of 05/23/2022  Medication Sig   acetaminophen (TYLENOL) 650 MG CR tablet Take 650 mg by mouth every 8 (eight) hours as needed for pain.   ALPRAZolam (XANAX) 0.5 MG tablet Take 1 tablet (0.5 mg total) by mouth daily as needed for anxiety.   aspirin EC 325 MG tablet Take 325 mg by mouth daily.   cholestyramine (QUESTRAN) 4 g packet DISSOLVE & TAKE 1 POWDER BY MOUTH TWICE DAILY   dicyclomine (BENTYL) 10 MG capsule Take 1 capsule by mouth twice daily   diphenoxylate-atropine (LOMOTIL) 2.5-0.025 MG tablet TAKE 1 TABLET BY MOUTH TWICE DAILY AS NEEDED FOR DIARRHEA OR LOOSE STOOLS   DULoxetine (CYMBALTA) 20 MG capsule Take 1 capsule (20 mg total) by mouth every other day.   famotidine (PEPCID) 20 MG tablet Take 1 tablet by mouth once daily   fexofenadine (ALLEGRA) 180 MG tablet Take 180 mg by mouth daily.   fluticasone (FLONASE) 50 MCG/ACT nasal spray Place 1 spray into both nostrils 2 (two) times daily.   furosemide (  LASIX) 20 MG tablet Take 1 tablet (20 mg total) by mouth daily.   IRON PO Take 65 mg by mouth 3 (three) times a week.   losartan (COZAAR) 50 MG tablet Take 1 tablet (50 mg total) by mouth daily.   metoprolol succinate (TOPROL-XL) 25 MG 24 hr tablet Take 1 tablet (25 mg total) by mouth daily.   potassium chloride SA (KLOR-CON M) 20 MEQ tablet Take 1 tablet (20 mEq total) by mouth daily.   sodium chloride (OCEAN) 0.65 % SOLN nasal spray Place 1 spray into both nostrils as needed for congestion.   Vitamin D, Ergocalciferol, (DRISDOL) 1.25 MG (50000 UNIT) CAPS capsule Take 1  capsule by mouth once a week   amLODipine (NORVASC) 5 MG tablet Take 1 tablet (5 mg total) by mouth at bedtime. (Patient not taking: Reported on 05/23/2022)   food thickener (RESOURCE THICKENUP CLEAR) POWD Take 1 g by mouth as needed. (Patient not taking: Reported on 05/23/2022)   No facility-administered encounter medications on file as of 05/23/2022.    Allergies (verified) Butrans [buprenorphine], Codeine, and Morphine and related   History: Past Medical History:  Diagnosis Date   Actinic keratosis, hx of    chest wall (2012)   Allergic rhinitis    Anxiety    Arthritis    "spine, hips" (01/30/2017)   Bursitis    Cellulitis of right foot 01/30/2017   Colon polyps    Diverticulosis    Esophageal dysmotilities 10/16/2017   Fibromyalgia    "qwhere" (01/30/2017)   Gallstones    GERD (gastroesophageal reflux disease)    High cholesterol    History of kidney stones    History of stomach ulcers    Hx of squamous cell carcinoma excision    right mandible (2011), right arm (2014)   Hypertension    IBS (irritable bowel syndrome)    Osteoarthritis    Pneumonia 2000s X 2   "twice" (01/30/2017)   Pyloric stenosis    Stroke ([place]) 10/2014   denies residual on 01/30/2017   Past Surgical History:  Procedure Laterality Date   APPENDECTOMY     CARDIOVASCULAR STRESS TEST  04/2016   nuclear cardiolite stress test done in [place] (no ischemia, no LVH, no LV systolic function)   CATARACT EXTRACTION W/ INTRAOCULAR LENS  IMPLANT, BILATERAL Bilateral    CHOLECYSTECTOMY OPEN     COLONOSCOPY  2009, 2014   DIAGNOSTIC MAMMOGRAM  2009, 2012   TONSILLECTOMY     Family History  Problem Relation Age of Onset   Stroke Mother    Heart attack Mother    Stroke Father    Heart attack Father    Heart attack Maternal Grandmother    Stroke Maternal Grandmother    Stroke Paternal Grandmother    Heart attack Paternal Grandmother    Heart attack Maternal Grandfather    Stroke Maternal Grandfather     Stroke Paternal Grandfather    Heart attack Paternal Grandfather    Colon cancer Neg Hx    Pancreatic cancer Neg Hx    Stomach cancer Neg Hx    Esophageal cancer Neg Hx    Social History   Socioeconomic History   Marital status: Widowed    Spouse name: Not on file   Number of children: 4   Years of education: Not on file   Highest education level: Not on file  Occupational History   Occupation: retired  Tobacco Use   Smoking status: Never   Smokeless tobacco: Never  Vaping Use   Vaping Use: Never used  Substance and Sexual Activity   Alcohol use: Yes    Comment: glass of red wine maybe once week   Drug use: No   Sexual activity: Not on file  Other Topics Concern   Not on file  Social History Narrative   Not on file   Social Determinants of Health   Financial Resource Strain: Low Risk  (05/23/2022)   Overall Financial Resource Strain (CARDIA)    Difficulty of Paying Living Expenses: Not hard at all  Food Insecurity: No Food Insecurity (05/23/2022)   Hunger Vital Sign    Worried About Running Out of Food in the Last Year: Never true    Ran Out of Food in the Last Year: Never true  Transportation Needs: No Transportation Needs (05/23/2022)   PRAPARE - Hydrologist (Medical): No    Lack of Transportation (Non-Medical): No  Physical Activity: Insufficiently Active (05/23/2022)   Exercise Vital Sign    Days of Exercise per Week: 2 days    Minutes of Exercise per Session: 20 min  Stress: No Stress Concern Present (05/23/2022)   [place]    Feeling of Stress : Not at all  Social Connections: Moderately Integrated (05/23/2022)   Social Connection and Isolation Panel [NHANES]    Frequency of Communication with Friends and Family: Three times a week    Frequency of Social Gatherings with Friends and Family: Three times a week    Attends Religious Services: More than 4 times per year     Active Member of Clubs or Organizations: Yes    Attends Archivist Meetings: 1 to 4 times per year    Marital Status: Widowed    Tobacco Counseling Counseling given: Not Answered   Clinical Intake:  Pre-visit preparation completed: Yes  Pain : No/denies pain     Nutritional Risks: None Diabetes: No  How often do you need to have someone help you when you read instructions, pamphlets, or other written materials from your doctor or pharmacy?: 1 - Never What is the last grade level you completed in school?: High School  Diabetic?no  Interpreter Needed?: No  Information entered by :: [place] of Daily Living    05/23/2022   11:58 AM  In your present state of health, do you have any difficulty performing the following activities:  Hearing? 0  Vision? 0  Difficulty concentrating or making decisions? 0  Walking or climbing stairs? 0  Dressing or bathing? 0  Doing errands, shopping? 0  Preparing Food and eating ? N  Using the Toilet? N  In the past six months, have you accidently leaked urine? N  Do you have problems with loss of bowel control? N  Managing your Medications? N  Managing your Finances? N  Housekeeping or managing your Housekeeping? N    Patient Care Team: Nche, Charlene Brooke, NP as PCP - General (Internal Medicine)  Indicate any recent Medical Services you may have received from other than [facility] providers in the past year (date may be approximate).     Assessment:   This is a routine wellness examination for Susan [name].  Hearing/Vision screen Vision Screening - Comments:: Declined wears glasses   Dietary issues and exercise activities discussed: Current Exercise Habits: The patient does not participate in regular exercise at present   Goals Addressed   None    Depression Screen  05/23/2022   11:53 AM 04/03/2022    2:53 PM 04/03/2022    2:52 PM 12/28/2021    2:47 PM 06/27/2021    2:11 PM 05/17/2021    1:27 PM  06/02/2020    2:33 PM  PHQ 2/9 Scores  PHQ - 2 Score 0 0 0 0 1 0 0  PHQ- 9 Score  0  '4 8  11    '$ Fall Risk    05/23/2022   11:54 AM 12/28/2021    2:03 PM 05/17/2021    1:05 PM 03/28/2021   11:06 AM 09/13/2020    3:45 PM  Fall Risk   Falls in the past year? 0 0 0 0 0  Number falls in past yr: 0 0 0 0 0  Injury with Fall? 0 0 0 0 0  Risk for fall due to :  No Fall Risks  No Fall Risks Impaired balance/gait  Follow up Falls evaluation completed Falls evaluation completed Falls evaluation completed;Falls prevention discussed Falls evaluation completed Falls evaluation completed;Education provided;Follow up appointment  Comment walker        FALL RISK PREVENTION PERTAINING TO THE HOME:  Any stairs in or around the home? No  If so, are there any without handrails? No  Home free of loose throw rugs in walkways, pet beds, electrical cords, etc? Yes  Adequate lighting in your home to reduce risk of falls? Yes   ASSISTIVE DEVICES UTILIZED TO PREVENT FALLS:  Life alert? Yes  Use of a cane, walker or w/c? Yes  Grab bars in the bathroom? Yes  Shower chair or bench in shower? Yes  Elevated toilet seat or a handicapped toilet? Yes    Cognitive Function:Normal cognitive status assessed by telephone conversation  by this Nurse Health Advisor. No abnormalities found.      05/16/2017   11:16 AM  MMSE - Mini Mental State Exam  Orientation to time 5  Orientation to Place 5  Registration 3  Attention/ Calculation 5  Recall 2  Language- name 2 objects 2  Language- repeat 1  Language- follow 3 step command 3  Language- read & follow direction 1  Write a sentence 1  Copy design 1  Total score 29        05/17/2021    1:10 PM  6CIT Screen  What Year? 0 points  What month? 0 points  What time? 0 points  Count back from 20 2 points  Months in reverse 0 points  Repeat phrase 0 points  Total Score 2 points    Immunizations Immunization History  Administered Date(s) Administered    Fluad Quad(high Dose 65+) 08/15/2019, 09/03/2020   Influenza, High Dose Seasonal PF 09/07/2017, 09/11/2018   Influenza-Unspecified 08/12/2021   Moderna Covid-19 Vaccine Bivalent Booster 77yr & up 11/15/2021   Moderna Sars-Covid-2 Vaccination 12/20/2019, 01/20/2020, 09/30/2020, 04/12/2021   Pneumococcal Conjugate-13 05/16/2017    TDAP status: Due, Education has been provided regarding the importance of this vaccine. Advised may receive this vaccine at local pharmacy or Health Dept. Aware to provide a copy of the vaccination record if obtained from local pharmacy or Health Dept. Verbalized acceptance and understanding.  Flu Vaccine status: Up to date  Pneumococcal vaccine status: Up to date  Covid-19 vaccine status: Completed vaccines  Qualifies for Shingles Vaccine? Yes   Zostavax completed No   Shingrix Completed?: No.    Education has been provided regarding the importance of this vaccine. Patient has been advised to call insurance company to determine out  of pocket expense if they have not yet received this vaccine. Advised may also receive vaccine at local pharmacy or Health Dept. Verbalized acceptance and understanding.  Screening Tests Health Maintenance  Topic Date Due   TETANUS/TDAP  Never done   Zoster Vaccines- Shingrix (1 of 2) Never done   COVID-19 Vaccine (6 - Moderna series) 03/16/2022   Pneumonia Vaccine 73+ Years old (2 - PPSV23 if available, else PCV20) 04/04/2023 (Originally 05/16/2018)   INFLUENZA VACCINE  07/04/2022   DEXA SCAN  Completed   HPV VACCINES  Aged Out    Health Maintenance  Health Maintenance Due  Topic Date Due   TETANUS/TDAP  Never done   Zoster Vaccines- Shingrix (1 of 2) Never done   COVID-19 Vaccine (6 - Moderna series) 03/16/2022    Colorectal cancer screening: No longer required.   Mammogram status: No longer required due to age.  Bone Density status: Ordered declined . Pt provided with contact info and advised to call to schedule  appt.  Lung Cancer Screening: (Low Dose CT Chest recommended if Age 39-80 years, 30 pack-year currently smoking OR have quit w/in 15years.) does not qualify.   Lung Cancer Screening Referral: n/a  Additional Screening:  Hepatitis C Screening: does not qualify;   Vision Screening: Recommended annual ophthalmology exams for early detection of glaucoma and other disorders of the eye. Is the patient up to date with their annual eye exam?  No  Who is the provider or what is the name of the office in which the patient attends annual eye exams? Declined  If pt is not established with a provider, would they like to be referred to a provider to establish care? No .   Dental Screening: Recommended annual dental exams for proper oral hygiene  Community Resource Referral / Chronic Care Management: CRR required this visit?  No   CCM required this visit?  No      Plan:     I have personally reviewed and noted the following in the patient's chart:   Medical and social history Use of alcohol, tobacco or illicit drugs  Current medications and supplements including opioid prescriptions.  Functional ability and status Nutritional status Physical activity Advanced directives List of other physicians Hospitalizations, surgeries, and ER visits in previous 12 months Vitals Screenings to include cognitive, depression, and falls Referrals and appointments  In addition, I have reviewed and discussed with patient certain preventive protocols, quality metrics, and best practice recommendations. A written personalized care plan for preventive services as well as general preventive health recommendations were provided to patient.     Randel Pigg, LPN   [id number]   Nurse Notes: none

## 2022-06-01 ENCOUNTER — Other Ambulatory Visit: Payer: Self-pay | Admitting: Nurse Practitioner

## 2022-06-01 DIAGNOSIS — I10 Essential (primary) hypertension: Secondary | ICD-10-CM

## 2022-06-01 DIAGNOSIS — K2100 Gastro-esophageal reflux disease with esophagitis, without bleeding: Secondary | ICD-10-CM

## 2022-06-01 NOTE — Telephone Encounter (Signed)
Chart supports Rx Last OV: 04/2022 Next OV: 07/2022

## 2022-06-25 ENCOUNTER — Other Ambulatory Visit: Payer: Self-pay | Admitting: Nurse Practitioner

## 2022-06-25 DIAGNOSIS — I10 Essential (primary) hypertension: Secondary | ICD-10-CM

## 2022-06-26 NOTE — Telephone Encounter (Signed)
Chart supports Rx Last OV: 04/2022 Next OV: 07/2022

## 2022-07-05 ENCOUNTER — Ambulatory Visit (INDEPENDENT_AMBULATORY_CARE_PROVIDER_SITE_OTHER): Payer: Medicare Other | Admitting: Nurse Practitioner

## 2022-07-05 ENCOUNTER — Encounter: Payer: Self-pay | Admitting: Nurse Practitioner

## 2022-07-05 VITALS — BP 130/60 | HR 59 | Temp 97.3°F | Ht 61.0 in | Wt 101.6 lb

## 2022-07-05 DIAGNOSIS — I10 Essential (primary) hypertension: Secondary | ICD-10-CM | POA: Diagnosis not present

## 2022-07-05 MED ORDER — LOSARTAN POTASSIUM 50 MG PO TABS
100.0000 mg | ORAL_TABLET | Freq: Every day | ORAL | 2 refills | Status: DC
Start: 2022-07-05 — End: 2023-07-09

## 2022-07-05 MED ORDER — METOPROLOL SUCCINATE ER 25 MG PO TB24: 25.0000 mg | ORAL_TABLET | Freq: Every day | ORAL | 3 refills | Status: DC

## 2022-07-05 NOTE — Patient Instructions (Signed)
Maintain current medications  

## 2022-07-05 NOTE — Progress Notes (Signed)
Established Patient Visit  Patient: Susan [name]   DOB: [birth year]   88 y.o. Female  MRN: [id number] Visit Date: 07/05/2022  Subjective:    Chief Complaint  Patient presents with   Office Visit    HTN F/u Checks BP 2-3 times a week No concerns    HPI Essential hypertension Home BP 130s/70s BP at goal with losartan, metoprolol and furosemide BP Readings from Last 3 Encounters:  07/05/22 130/60  05/02/22 128/82  04/03/22 116/62   Maintain med dose  Wt Readings from Last 3 Encounters:  07/05/22 101 lb 9.6 oz (46.1 kg)  05/02/22 104 lb (47.2 kg)  04/03/22 100 lb 3.2 oz (45.5 kg)    Reviewed medical, surgical, and social history today  Medications: Outpatient Medications Prior to Visit  Medication Sig   acetaminophen (TYLENOL) 650 MG CR tablet Take 650 mg by mouth every 8 (eight) hours as needed for pain.   ALPRAZolam (XANAX) 0.5 MG tablet Take 1 tablet (0.5 mg total) by mouth daily as needed for anxiety.   aspirin EC 325 MG tablet Take 325 mg by mouth daily.   cholestyramine (QUESTRAN) 4 g packet DISSOLVE & TAKE 1 POWDER BY MOUTH TWICE DAILY   dicyclomine (BENTYL) 10 MG capsule Take 1 capsule by mouth twice daily   diphenoxylate-atropine (LOMOTIL) 2.5-0.025 MG tablet TAKE 1 TABLET BY MOUTH TWICE DAILY AS NEEDED FOR DIARRHEA OR LOOSE STOOLS   DULoxetine (CYMBALTA) 20 MG capsule Take 1 capsule (20 mg total) by mouth every other day.   famotidine (PEPCID) 20 MG tablet Take 1 tablet by mouth once daily   fexofenadine (ALLEGRA) 180 MG tablet Take 180 mg by mouth daily.   fluticasone (FLONASE) 50 MCG/ACT nasal spray Place 1 spray into both nostrils 2 (two) times daily.   food thickener (RESOURCE THICKENUP CLEAR) POWD Take 1 g by mouth as needed.   furosemide (LASIX) 20 MG tablet Take 1 tablet (20 mg total) by mouth daily.   IRON PO Take 65 mg by mouth 3 (three) times a week.   potassium chloride SA (KLOR-CON M) 20 MEQ tablet Take 1 tablet (20 mEq total) by mouth  daily.   sodium chloride (OCEAN) 0.65 % SOLN nasal spray Place 1 spray into both nostrils as needed for congestion.   Vitamin D, Ergocalciferol, (DRISDOL) 1.25 MG (50000 UNIT) CAPS capsule Take 1 capsule by mouth once a week   [DISCONTINUED] losartan (COZAAR) 50 MG tablet Take 2 tablets by mouth once daily   [DISCONTINUED] metoprolol succinate (TOPROL-XL) 25 MG 24 hr tablet Take 1 tablet by mouth once daily   [DISCONTINUED] amLODipine (NORVASC) 5 MG tablet Take 1 tablet (5 mg total) by mouth at bedtime. (Patient not taking: Reported on 05/23/2022)   No facility-administered medications prior to visit.   Reviewed past medical and social history.   ROS per HPI above      Objective:  BP 130/60 (BP Location: Left Arm, Patient Position: Sitting, Cuff Size: Small)   Pulse (!) 59   Temp (!) 97.3 F (36.3 C) (Temporal)   Ht '5\' 1"'$  (1.549 m)   Wt 101 lb 9.6 oz (46.1 kg)   SpO2 96%   BMI 19.20 kg/m      Physical Exam Cardiovascular:     Rate and Rhythm: Normal rate and regular rhythm.     Pulses: Normal pulses.     Heart sounds: Murmur heard.  Pulmonary:  Effort: Pulmonary effort is normal.     Breath sounds: Normal breath sounds.  Musculoskeletal:     Right lower leg: No edema.     Left lower leg: No edema.  Neurological:     Mental Status: She is alert and oriented to person, place, and time.  Psychiatric:        Mood and Affect: Mood normal.        Behavior: Behavior normal.        Thought Content: Thought content normal.     No results found for any visits on 07/05/22.    Assessment & Plan:    Problem List Items Addressed This Visit       Cardiovascular and Mediastinum   Essential hypertension - Primary    Home BP 130s/70s BP at goal with losartan, metoprolol and furosemide BP Readings from Last 3 Encounters:  07/05/22 130/60  05/02/22 128/82  04/03/22 116/62   Maintain med dose      Relevant Medications   metoprolol succinate (TOPROL-XL) 25 MG 24 hr  tablet   losartan (COZAAR) 50 MG tablet   Return in about 6 months (around 01/05/2023) for HTN, hyperlipidemia, hyperglycemia (fasting).     Wilfred Lacy, NP

## 2022-07-05 NOTE — Assessment & Plan Note (Signed)
Home BP 130s/70s BP at goal with losartan, metoprolol and furosemide BP Readings from Last 3 Encounters:  07/05/22 130/60  05/02/22 128/82  04/03/22 116/62   Maintain med dose

## 2022-07-28 NOTE — Telephone Encounter (Signed)
Na

## 2022-07-31 NOTE — Progress Notes (Unsigned)
[facility] Phone: [phone] Subjective:   [name], [name]  I'm seeing this patient by the request  of:  Nche, Charlene Brooke, NP  CC: left knee pain   TIR:[id number]  05/02/2022 Significant improvement after the viscosupplementation.  Patient can follow-up with me again in 3 months otherwise.  Updated 08/02/2022 Susan [name] is a 88 y.o. female coming in with complaint of L knee pain. Pain at night but overall pain has improved. Hip pain is manageable with topical analgesics.        Past Medical History:  Diagnosis Date   Actinic keratosis, hx of    chest wall (2012)   Allergic rhinitis    Anxiety    Arthritis    "spine, hips" (01/30/2017)   Bursitis    Cellulitis of right foot 01/30/2017   Colon polyps    Diverticulosis    Esophageal dysmotilities 10/16/2017   Fibromyalgia    "qwhere" (01/30/2017)   Gallstones    GERD (gastroesophageal reflux disease)    High cholesterol    History of kidney stones    History of stomach ulcers    Hx of squamous cell carcinoma excision    right mandible (2011), right arm (2014)   Hypertension    IBS (irritable bowel syndrome)    Osteoarthritis    Pneumonia 2000s X 2   "twice" (01/30/2017)   Pyloric stenosis    Stroke ([place]) 10/2014   denies residual on 01/30/2017   Past Surgical History:  Procedure Laterality Date   APPENDECTOMY     CARDIOVASCULAR STRESS TEST  04/2016   nuclear cardiolite stress test done in [place] (no ischemia, no LVH, no LV systolic function)   CATARACT EXTRACTION W/ INTRAOCULAR LENS  IMPLANT, BILATERAL Bilateral    CHOLECYSTECTOMY OPEN     COLONOSCOPY  2009, 2014   DIAGNOSTIC MAMMOGRAM  2009, 2012   TONSILLECTOMY     Social History   Socioeconomic History   Marital status: Widowed    Spouse name: Not on file   Number of children: 4   Years of education: Not on file   Highest  education level: Not on file  Occupational History   Occupation: retired  Tobacco Use   Smoking status: Never   Smokeless tobacco: Never  Vaping Use   Vaping Use: Never used  Substance and Sexual Activity   Alcohol use: Yes    Comment: glass of red wine maybe once week   Drug use: No   Sexual activity: Not on file  Other Topics Concern   Not on file  Social History Narrative   Not on file   Social Determinants of Health   Financial Resource Strain: Low Risk  (05/23/2022)   Overall Financial Resource Strain (CARDIA)    Difficulty of Paying Living Expenses: Not hard at all  Food Insecurity: No Food Insecurity (05/23/2022)   Hunger Vital Sign    Worried About Running Out of Food in the Last Year: Never true    [place] in the Last Year: Never true  Transportation Needs: No Transportation Needs (05/23/2022)   PRAPARE - Hydrologist (Medical): No    Lack of Transportation (Non-Medical): No  Physical Activity: Insufficiently Active (05/23/2022)   Exercise Vital Sign    Days of Exercise per Week: 2 days    Minutes of Exercise per Session: 20 min  Stress: No Stress Concern Present (05/23/2022)   [place]    Feeling of Stress : Not at all  Social Connections: Moderately Integrated (05/23/2022)   Social Connection and Isolation Panel [NHANES]    Frequency of Communication with Friends and Family: Three times a week    Frequency of Social Gatherings with Friends and Family: Three times a week    Attends Religious Services: More than 4 times per year    Active Member of Clubs or Organizations: Yes    Attends Archivist Meetings: 1 to 4 times per year    Marital Status: Widowed   Allergies  Allergen Reactions   Butrans [Buprenorphine] Other (See Comments)    Severe chest pains   Codeine Other (See Comments)    Severe Chest pains   Morphine And Related Other (See Comments)     Severe chest pains   Family History  Problem Relation Age of Onset   Stroke Mother    Heart attack Mother    Stroke Father    Heart attack Father    Heart attack Maternal Grandmother    Stroke Maternal Grandmother    Stroke Paternal Grandmother    Heart attack Paternal Grandmother    Heart attack Maternal Grandfather    Stroke Maternal Grandfather    Stroke Paternal Grandfather    Heart attack Paternal Grandfather    Colon cancer Neg Hx    Pancreatic cancer Neg Hx    Stomach cancer Neg Hx    Esophageal cancer Neg Hx      Current Outpatient Medications (Cardiovascular):    cholestyramine (QUESTRAN) 4 g packet, DISSOLVE & TAKE 1 POWDER BY MOUTH TWICE DAILY   furosemide (LASIX) 20 MG tablet, Take 1 tablet (20 mg total) by mouth daily.   losartan (COZAAR) 50 MG tablet, Take 2 tablets (100 mg total) by mouth daily.   metoprolol succinate (TOPROL-XL) 25 MG 24 hr tablet, Take 1 tablet (25 mg total) by mouth daily.  Current Outpatient Medications (Respiratory):    fexofenadine (ALLEGRA) 180 MG tablet, Take 180 mg by mouth daily.   fluticasone (FLONASE) 50 MCG/ACT nasal spray, Place 1 spray into both nostrils 2 (two) times daily.   sodium chloride (OCEAN) 0.65 % SOLN nasal spray, Place 1 spray into both nostrils as needed for congestion.  Current Outpatient Medications (Analgesics):    acetaminophen (TYLENOL) 650 MG CR tablet, Take 650 mg by mouth every 8 (eight) hours as needed for pain.   aspirin EC 325 MG tablet, Take 325 mg by mouth daily.  Current Outpatient Medications (Hematological):    IRON PO, Take 65 mg by mouth 3 (three) times a week.  Current Outpatient Medications (Other):    ALPRAZolam (XANAX) 0.5 MG tablet, Take 1 tablet (0.5 mg total) by mouth daily as needed for anxiety.   dicyclomine (BENTYL) 10 MG capsule, Take 1 capsule by mouth twice daily   diphenoxylate-atropine (LOMOTIL) 2.5-0.025 MG tablet, TAKE 1 TABLET BY MOUTH TWICE DAILY AS NEEDED FOR DIARRHEA OR  LOOSE STOOLS   DULoxetine (CYMBALTA) 20 MG capsule, Take 1 capsule (20 mg total) by mouth every other day.   famotidine (PEPCID) 20 MG tablet, Take 1 tablet by mouth once daily   food thickener (RESOURCE THICKENUP CLEAR) POWD, Take 1 g by mouth as needed.   potassium chloride SA (KLOR-CON M) 20 MEQ tablet, Take 1 tablet (20 mEq total) by mouth daily.   Vitamin D, Ergocalciferol, (DRISDOL) 1.25 MG (50000  UNIT) CAPS capsule, Take 1 capsule by mouth once a week   Reviewed prior external information including notes and imaging from  primary care provider As well as notes that were available from care everywhere and other healthcare systems.  Past medical history, social, surgical and family history all reviewed in electronic medical record.  No pertanent information unless stated regarding to the chief complaint.   Review of Systems:  No headache, visual changes, nausea, vomiting, diarrhea, constipation, dizziness, abdominal pain, skin rash, fevers, chills, night sweats, weight loss, swollen lymph nodes,  joint swelling, chest pain, shortness of breath, mood changes. POSITIVE muscle aches, body aches  Objective  Blood pressure (!) 142/78, pulse 70, height '5\' 1"'$  (1.549 m), weight 102 lb (46.3 kg), SpO2 96 %.   General: No apparent distress alert and oriented x3 mood and affect normal, dressed appropriately.  HEENT: Pupils equal, extraocular movements intact  Respiratory: Patient's speak in full sentences and does not appear short of breath  Cardiovascular: No lower extremity edema, non tender, no erythema  Antalgic gait on the rolling walker.  Patient's left knee trace effusion noted.  Some instability with valgus and varus force.  Patient though doing much better overall with range of motion.    Impression and Recommendations:    The above documentation has been reviewed and is accurate and complete Lyndal Pulley, DO

## 2022-08-02 ENCOUNTER — Encounter: Payer: Self-pay | Admitting: Family Medicine

## 2022-08-02 ENCOUNTER — Ambulatory Visit (INDEPENDENT_AMBULATORY_CARE_PROVIDER_SITE_OTHER): Payer: Medicare Other | Admitting: Family Medicine

## 2022-08-02 DIAGNOSIS — M1712 Unilateral primary osteoarthritis, left knee: Secondary | ICD-10-CM | POA: Diagnosis not present

## 2022-08-02 NOTE — Patient Instructions (Signed)
Doing amazing Will l get approval for gel just in case Keep being you

## 2022-08-02 NOTE — Assessment & Plan Note (Signed)
Does have the arthritic changes.  Discussed with patient again at great length that we could consider repeating the viscosupplementation but patient is doing very well.  Discussed icing regimen which activities to avoid.  Follow-up again in 6 to 8 weeks.

## 2022-08-14 DIAGNOSIS — Z974 Presence of external hearing-aid: Secondary | ICD-10-CM | POA: Diagnosis not present

## 2022-08-14 DIAGNOSIS — H905 Unspecified sensorineural hearing loss: Secondary | ICD-10-CM | POA: Insufficient documentation

## 2022-08-14 DIAGNOSIS — H903 Sensorineural hearing loss, bilateral: Secondary | ICD-10-CM | POA: Insufficient documentation

## 2022-08-14 DIAGNOSIS — H9201 Otalgia, right ear: Secondary | ICD-10-CM | POA: Diagnosis not present

## 2022-08-14 DIAGNOSIS — M26609 Unspecified temporomandibular joint disorder, unspecified side: Secondary | ICD-10-CM | POA: Diagnosis not present

## 2022-08-16 IMAGING — DX DG ABDOMEN 2V
2 series · 2 of 2 positions shown · non-contrast
Comparison: None.

CLINICAL DATA: Chronic diarrhea

EXAM:
ABDOMEN - 2 VIEW

[abdomen erect]
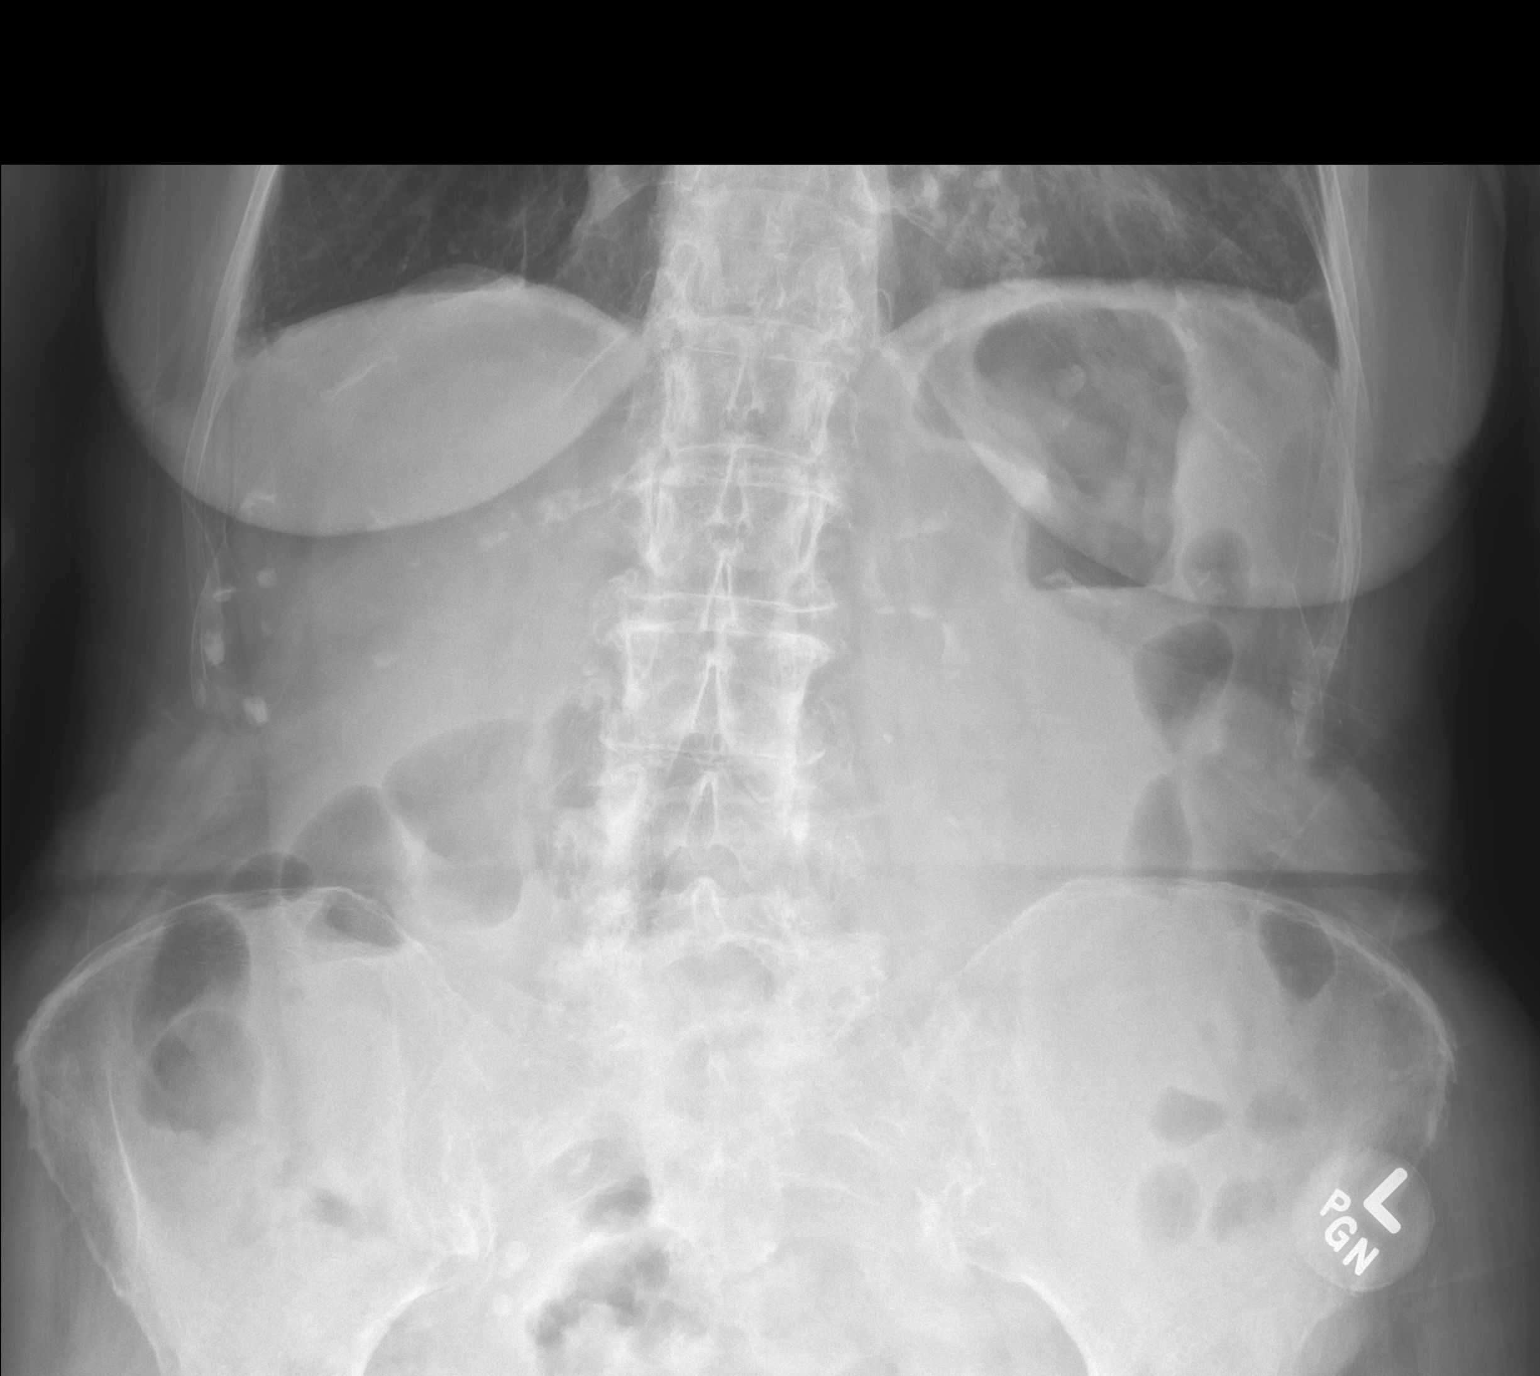

[abdomen supine]
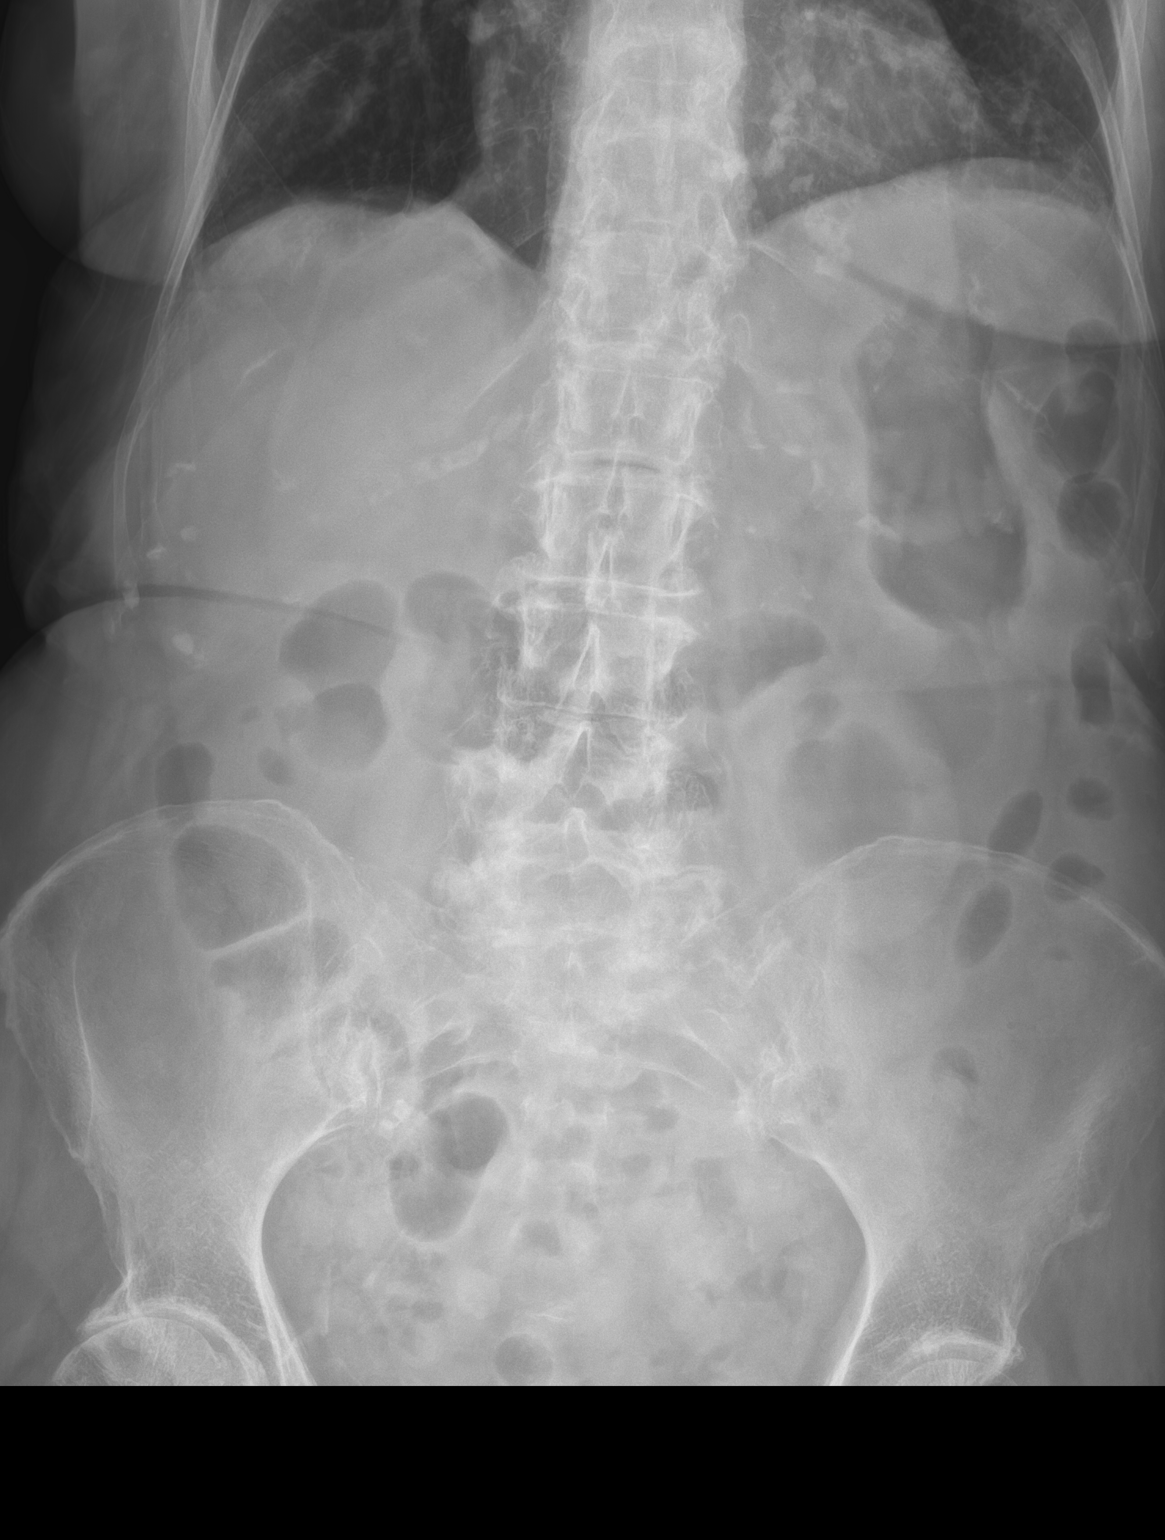

[2 of 2 positions shown; findings below may reference images not displayed]

FINDINGS: Scattered large and small bowel gas is noted. No abnormal mass or
abnormal calcifications are seen. No obstructive changes are noted.
Degenerative changes of lumbar spine are seen.
IMPRESSION: No acute abnormality noted.

## 2022-08-16 IMAGING — DX DG PELVIS 1-2V
1 series · 1 of 1 positions shown · non-contrast
Comparison: None.

CLINICAL DATA: Bilateral hip pain

EXAM:
PELVIS - 1 VIEW

[pelvis ap]
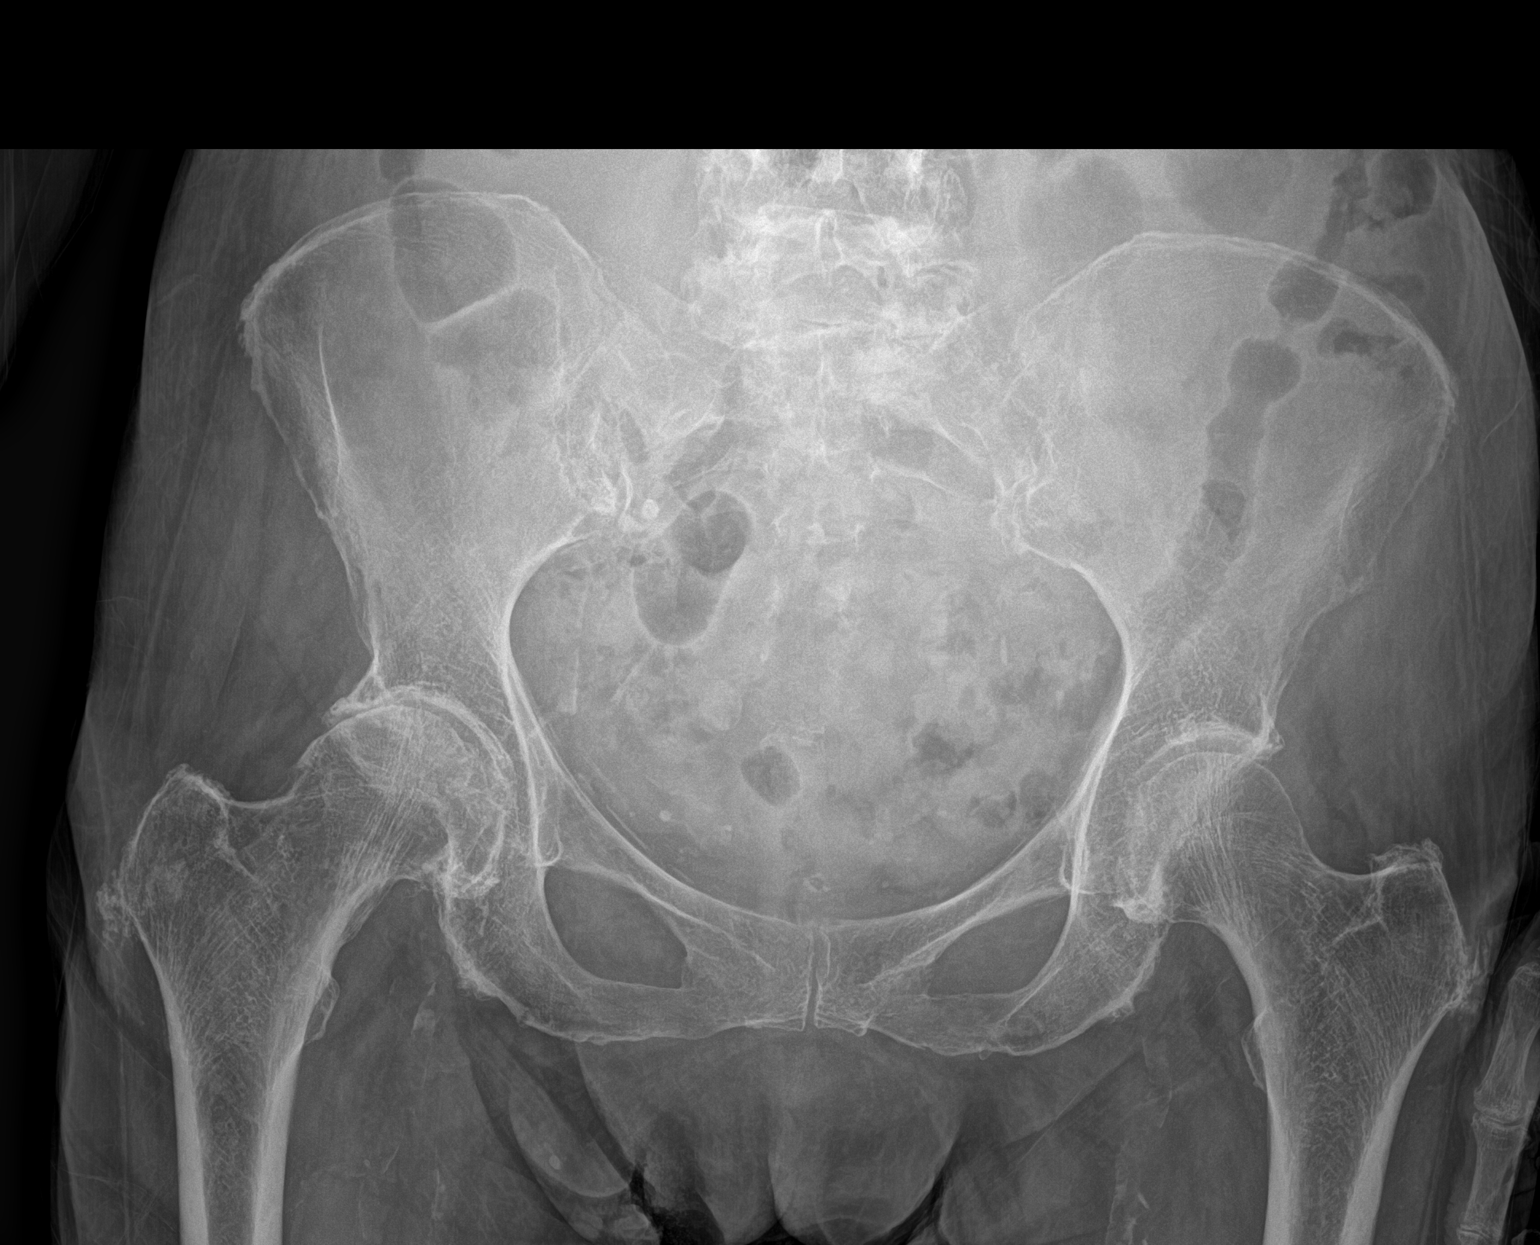

[1 of 1 positions shown; findings below may reference images not displayed]

FINDINGS: Pelvic ring is intact. Degenerative changes of the hip joints are
noted bilaterally right greater than left with superior joint space
narrowing on the right noted. Degenerative change of the lumbar
spine is seen as well. No soft tissue abnormality is noted.
IMPRESSION: Degenerative change of the hip joints right greater than left
without acute abnormality.

## 2022-08-24 ENCOUNTER — Other Ambulatory Visit: Payer: Self-pay | Admitting: Nurse Practitioner

## 2022-08-24 ENCOUNTER — Other Ambulatory Visit: Payer: Self-pay | Admitting: Family Medicine

## 2022-08-24 DIAGNOSIS — K529 Noninfective gastroenteritis and colitis, unspecified: Secondary | ICD-10-CM

## 2022-08-24 NOTE — Telephone Encounter (Signed)
Chart supports Rx Last OV: 07/2022 Next OV: 01/2023  

## 2022-08-30 DIAGNOSIS — Z23 Encounter for immunization: Secondary | ICD-10-CM | POA: Diagnosis not present

## 2022-09-02 ENCOUNTER — Other Ambulatory Visit: Payer: Self-pay | Admitting: Family Medicine

## 2022-09-15 ENCOUNTER — Other Ambulatory Visit: Payer: Self-pay | Admitting: Nurse Practitioner

## 2022-09-15 DIAGNOSIS — K2100 Gastro-esophageal reflux disease with esophagitis, without bleeding: Secondary | ICD-10-CM

## 2022-09-18 NOTE — Telephone Encounter (Signed)
Chart supports Rx Last OV: 07/2022 Next OV: 01/2023  

## 2022-10-02 ENCOUNTER — Other Ambulatory Visit: Payer: Self-pay | Admitting: Nurse Practitioner

## 2022-10-02 DIAGNOSIS — F411 Generalized anxiety disorder: Secondary | ICD-10-CM

## 2022-10-04 DIAGNOSIS — Z23 Encounter for immunization: Secondary | ICD-10-CM | POA: Diagnosis not present

## 2022-10-09 ENCOUNTER — Other Ambulatory Visit: Payer: Self-pay | Admitting: Family Medicine

## 2022-10-11 ENCOUNTER — Ambulatory Visit (INDEPENDENT_AMBULATORY_CARE_PROVIDER_SITE_OTHER): Payer: Medicare Other | Admitting: Family Medicine

## 2022-10-11 VITALS — BP 128/60 | HR 58 | Ht 61.0 in

## 2022-10-11 DIAGNOSIS — M1712 Unilateral primary osteoarthritis, left knee: Secondary | ICD-10-CM

## 2022-10-11 NOTE — Progress Notes (Signed)
[name] [place] Phone: [phone] Subjective:   Susan [name], [name]  I'm seeing this patient by the request  of:  Nche, Charlene Brooke, NP  CC: left knee pain   [id number]  08/02/2022 Does have the arthritic changes.  Discussed with patient again at great length that we could consider repeating the viscosupplementation but patient is doing very well.  Discussed icing regimen which activities to avoid.  Follow-up again in 6 to 8 weeks.     Susan [name] is a 88 y.o. female coming in with complaint of Left knee pain. Patient states that the left knee pain is doing pretty well. Patient was diagnosed with TMJ last month so she has been having issues with that and found out because of the ear pain.       Past Medical History:  Diagnosis Date   Actinic keratosis, hx of    chest wall (2012)   Allergic rhinitis    Anxiety    Arthritis    "spine, hips" (01/30/2017)   Bursitis    Cellulitis of right foot 01/30/2017   Colon polyps    Diverticulosis    Esophageal dysmotilities 10/16/2017   Fibromyalgia    "qwhere" (01/30/2017)   Gallstones    GERD (gastroesophageal reflux disease)    High cholesterol    History of kidney stones    History of stomach ulcers    Hx of squamous cell carcinoma excision    right mandible (2011), right arm (2014)   Hypertension    IBS (irritable bowel syndrome)    Osteoarthritis    Pneumonia 2000s X 2   "twice" (01/30/2017)   Pyloric stenosis    Stroke ([place]) 10/2014   denies residual on 01/30/2017   Past Surgical History:  Procedure Laterality Date   APPENDECTOMY     CARDIOVASCULAR STRESS TEST  04/2016   nuclear cardiolite stress test done in [place] (no ischemia, no LVH, no LV systolic function)   CATARACT EXTRACTION W/ INTRAOCULAR LENS  IMPLANT, BILATERAL Bilateral    CHOLECYSTECTOMY OPEN     COLONOSCOPY  2009, 2014   DIAGNOSTIC  MAMMOGRAM  2009, 2012   TONSILLECTOMY     Social History   Socioeconomic History   Marital status: Widowed    Spouse name: Not on file   Number of children: 4   Years of education: Not on file   Highest education level: Not on file  Occupational History   Occupation: retired  Tobacco Use   Smoking status: Never   Smokeless tobacco: Never  Vaping Use   Vaping Use: Never used  Substance and Sexual Activity   Alcohol use: Yes    Comment: glass of red wine maybe once week   Drug use: No   Sexual activity: Not on file  Other Topics Concern   Not on file  Social History Narrative   Not on file   Social Determinants of Health   Financial Resource Strain: Low Risk  (05/23/2022)   Overall Financial Resource Strain (CARDIA)    Difficulty of Paying Living Expenses: Not hard at all  Food Insecurity: No Food Insecurity (05/23/2022)   Hunger Vital Sign    Worried About Running Out of Food in the Last Year: Never true    [place] in the Last Year: Never true  Transportation Needs: No Transportation Needs (05/23/2022)   PRAPARE - Transportation  Lack of Transportation (Medical): No    Lack of Transportation (Non-Medical): No  Physical Activity: Insufficiently Active (05/23/2022)   Exercise Vital Sign    Days of Exercise per Week: 2 days    Minutes of Exercise per Session: 20 min  Stress: No Stress Concern Present (05/23/2022)   [place]    Feeling of Stress : Not at all  Social Connections: Moderately Integrated (05/23/2022)   Social Connection and Isolation Panel [NHANES]    Frequency of Communication with Friends and Family: Three times a week    Frequency of Social Gatherings with Friends and Family: Three times a week    Attends Religious Services: More than 4 times per year    Active Member of Clubs or Organizations: Yes    Attends Archivist Meetings: 1 to 4 times per year    Marital Status:  Widowed   Allergies  Allergen Reactions   Butrans [Buprenorphine] Other (See Comments)    Severe chest pains   Codeine Other (See Comments)    Severe Chest pains   Morphine And Related Other (See Comments)    Severe chest pains   Family History  Problem Relation Age of Onset   Stroke Mother    Heart attack Mother    Stroke Father    Heart attack Father    Heart attack Maternal Grandmother    Stroke Maternal Grandmother    Stroke Paternal Grandmother    Heart attack Paternal Grandmother    Heart attack Maternal Grandfather    Stroke Maternal Grandfather    Stroke Paternal Grandfather    Heart attack Paternal Grandfather    Colon cancer Neg Hx    Pancreatic cancer Neg Hx    Stomach cancer Neg Hx    Esophageal cancer Neg Hx      Current Outpatient Medications (Cardiovascular):    cholestyramine (QUESTRAN) 4 g packet, DISSOLVE & TAKE 1 POWDER BY MOUTH TWICE DAILY   furosemide (LASIX) 20 MG tablet, Take 1 tablet (20 mg total) by mouth daily.   losartan (COZAAR) 50 MG tablet, Take 2 tablets (100 mg total) by mouth daily.   metoprolol succinate (TOPROL-XL) 25 MG 24 hr tablet, Take 1 tablet (25 mg total) by mouth daily.  Current Outpatient Medications (Respiratory):    fexofenadine (ALLEGRA) 180 MG tablet, Take 180 mg by mouth daily.   fluticasone (FLONASE) 50 MCG/ACT nasal spray, Place 1 spray into both nostrils 2 (two) times daily.   sodium chloride (OCEAN) 0.65 % SOLN nasal spray, Place 1 spray into both nostrils as needed for congestion.  Current Outpatient Medications (Analgesics):    acetaminophen (TYLENOL) 650 MG CR tablet, Take 650 mg by mouth every 8 (eight) hours as needed for pain.   aspirin EC 325 MG tablet, Take 325 mg by mouth daily.  Current Outpatient Medications (Hematological):    IRON PO, Take 65 mg by mouth 3 (three) times a week.  Current Outpatient Medications (Other):    ALPRAZolam (XANAX) 0.5 MG tablet, TAKE 1 TABLET BY MOUTH ONCE DAILY AS NEEDED FOR  ANXIETY   dicyclomine (BENTYL) 10 MG capsule, Take 1 capsule by mouth twice daily   diphenoxylate-atropine (LOMOTIL) 2.5-0.025 MG tablet, TAKE 1 TABLET BY MOUTH TWICE DAILY AS NEEDED FOR DIARRHEA OR LOOSE STOOLS   DULoxetine (CYMBALTA) 20 MG capsule, Take 1 capsule by mouth once daily   famotidine (PEPCID) 20 MG tablet, Take 1 tablet by mouth once daily   food thickener (  RESOURCE THICKENUP CLEAR) POWD, Take 1 g by mouth as needed.   potassium chloride SA (KLOR-CON M) 20 MEQ tablet, Take 1 tablet (20 mEq total) by mouth daily.   Vitamin D, Ergocalciferol, (DRISDOL) 1.25 MG (50000 UNIT) CAPS capsule, Take 1 capsule by mouth once a week   Reviewed prior external information including notes and imaging from  primary care provider As well as notes that were available from care everywhere and other healthcare systems.  Past medical history, social, surgical and family history all reviewed in electronic medical record.  No pertanent information unless stated regarding to the chief complaint.   Review of Systems:  No headache, visual changes, nausea, vomiting, diarrhea, constipation, dizziness, abdominal pain, skin rash, fevers, chills, night sweats, weight loss, swollen lymph nodes,, joint swelling, chest pain, shortness of breath, mood changes. POSITIVE muscle aches, body aches  Objective  Blood pressure 128/60, pulse (!) 58, height '5\' 1"'$  (1.549 m), SpO2 96 %.   General: No apparent distress alert and oriented x3 mood and affect normal, dressed appropriately.  HEENT: Pupils equal, extraocular movements intact  Respiratory: Patient's speak in full sentences and does not appear short of breath  Patient does use a rolling walker to ambulate.  Patient still has tenderness diffusely of most of the muscles.  Patient's left knee does have some tenderness but very minimal.  No significant swelling at the moment.  Good range of motion.  Patient does have atrophy of the legs but bilaterally.   Impression  and Recommendations:     The above documentation has been reviewed and is accurate and complete Lyndal Pulley, DO

## 2022-10-11 NOTE — Assessment & Plan Note (Signed)
Patient still seems to be doing very well overall.  No significant change in management.  Last injection was nearly 8 months ago.  I think we can continue to monitor at this time.  Follow-up again in 8 weeks.

## 2022-10-11 NOTE — Patient Instructions (Addendum)
Good to see you  So glad you are doing well Follow up in 3 months

## 2022-11-15 ENCOUNTER — Other Ambulatory Visit: Payer: Self-pay | Admitting: Internal Medicine

## 2022-11-16 NOTE — Telephone Encounter (Signed)
Lomotil refilled °

## 2022-11-28 ENCOUNTER — Other Ambulatory Visit: Payer: Self-pay | Admitting: Nurse Practitioner

## 2022-11-28 DIAGNOSIS — K529 Noninfective gastroenteritis and colitis, unspecified: Secondary | ICD-10-CM

## 2022-11-28 NOTE — Telephone Encounter (Signed)
Chart supports Rx Last OV: 07/2022 Next OV: 01/2023

## 2022-12-26 ENCOUNTER — Other Ambulatory Visit: Payer: Self-pay | Admitting: Internal Medicine

## 2022-12-26 ENCOUNTER — Other Ambulatory Visit: Payer: Self-pay | Admitting: Family Medicine

## 2022-12-26 ENCOUNTER — Other Ambulatory Visit: Payer: Self-pay | Admitting: Nurse Practitioner

## 2022-12-26 DIAGNOSIS — K2100 Gastro-esophageal reflux disease with esophagitis, without bleeding: Secondary | ICD-10-CM

## 2022-12-26 NOTE — Telephone Encounter (Signed)
Chart supports Rx Last OV: 07/2022 Next OV: 01/2023

## 2023-01-02 ENCOUNTER — Other Ambulatory Visit: Payer: Self-pay | Admitting: Nurse Practitioner

## 2023-01-02 ENCOUNTER — Other Ambulatory Visit: Payer: Self-pay | Admitting: Internal Medicine

## 2023-01-02 DIAGNOSIS — R0982 Postnasal drip: Secondary | ICD-10-CM

## 2023-01-08 ENCOUNTER — Encounter: Payer: Self-pay | Admitting: Nurse Practitioner

## 2023-01-08 ENCOUNTER — Ambulatory Visit (INDEPENDENT_AMBULATORY_CARE_PROVIDER_SITE_OTHER): Payer: Medicare Other | Admitting: Nurse Practitioner

## 2023-01-08 VITALS — BP 138/74 | HR 62 | Temp 97.3°F | Ht 61.0 in | Wt 105.0 lb

## 2023-01-08 DIAGNOSIS — I10 Essential (primary) hypertension: Secondary | ICD-10-CM | POA: Diagnosis not present

## 2023-01-08 DIAGNOSIS — R1313 Dysphagia, pharyngeal phase: Secondary | ICD-10-CM

## 2023-01-08 DIAGNOSIS — R739 Hyperglycemia, unspecified: Secondary | ICD-10-CM

## 2023-01-08 DIAGNOSIS — E782 Mixed hyperlipidemia: Secondary | ICD-10-CM | POA: Diagnosis not present

## 2023-01-08 DIAGNOSIS — G3184 Mild cognitive impairment, so stated: Secondary | ICD-10-CM

## 2023-01-08 DIAGNOSIS — R4189 Other symptoms and signs involving cognitive functions and awareness: Secondary | ICD-10-CM | POA: Insufficient documentation

## 2023-01-08 NOTE — Assessment & Plan Note (Addendum)
Chronic, waxing at waning, has difficulty finding her words, forgets names and dates sometimes. She lives at an [facility] living facility. She participates in board games, puzzles, soduko, occassionally socializes with other residents to play dominoes and [place]. She frequently has visits with her family. Reports increased depression due to loss of 2close friends in last 52month. previous eval by neurology 2021: "EEG on 02/02/2020 showed mild bifrontal sharp waves without epileptiform activity. Carotid ultrasound and transcranial Doppler studies done on 01/27/2020 were both unremarkable. Recommended to eval every 625month MRI brain and CT head was never completed. No Hx of seizure or TIA Normal TSH, cbc, cmp, hgbA1c.  Recommended referral to therapist. She declined at this time. Also decided to wait on f/up with neurology at this time [facility]

## 2023-01-08 NOTE — Assessment & Plan Note (Signed)
Repeat ldl due to non fasting state

## 2023-01-08 NOTE — Patient Instructions (Addendum)
Go to lab Let me know if you change you mind about referral to therapist.  Managing Depression, Adult Depression is a mental health condition that affects your thoughts, feelings, and actions. Being diagnosed with depression can bring you relief if you did not know why you have felt or behaved a certain way. It could also leave you feeling overwhelmed. Finding ways to manage your symptoms can help you feel more positive about your future. How to manage lifestyle changes Being depressed is difficult. Depression can increase the level of everyday stress. Stress can make depression symptoms worse. You may believe your symptoms cannot be managed or will never improve. However, there are many things you can try to help manage your symptoms. There is hope. Managing stress  Stress is your body's reaction to life changes and events, both good and bad. Stress can add to your feelings of depression. Learning to manage your stress can help lessen your feelings of depression. Try some of the following approaches to reducing your stress (stress reduction techniques): Listen to music that you enjoy and that inspires you. Try using a meditation app or take a meditation class. Develop a practice that helps you connect with your spiritual self. Walk in nature, pray, or go to a place of worship. Practice deep breathing. To do this, inhale slowly through your nose. Pause at the top of your inhale for a few seconds and then exhale slowly, letting yourself relax. Repeat this three or four times. Practice yoga to help relax and work your muscles. Choose a stress reduction technique that works for you. These techniques take time and practice to develop. Set aside 5-15 minutes a day to do them. Therapists can offer training in these techniques. Do these things to help manage stress: Keep a journal. Know your limits. Set healthy boundaries for yourself and others, such as saying "no" when you think something is too  much. Pay attention to how you react to certain situations. You may not be able to control everything, but you can change your reaction. Add humor to your life by watching funny movies or shows. Make time for activities that you enjoy and that relax you. Spend less time using electronics, especially at night before bed. The light from screens can make your brain think it is time to get up rather than go to bed.  Medicines Medicines, such as antidepressants, are often a part of treatment for depression. Talk with your pharmacist or health care provider about all the medicines, supplements, and herbal products that you take, their possible side effects, and what medicines and other products are safe to take together. Make sure to report any side effects you may have to your health care provider. Relationships Your health care provider may suggest family therapy, couples therapy, or individual therapy as part of your treatment. How to recognize changes Everyone responds differently to treatment for depression. As you recover from depression, you may start to: Have more interest in doing activities. Feel more hopeful. Have more energy. Eat a more regular amount of food. Have better mental focus. It is important to recognize if your depression is not getting better or is getting worse. The symptoms you had in the beginning may return, such as: Feeling tired. Eating too much or too little. Sleeping too much or too little. Feeling restless, agitated, or hopeless. Trouble focusing or making decisions. Having unexplained aches and pains. Feeling irritable, angry, or aggressive. If you or your family members notice these symptoms coming back,  let your health care provider know right away. Follow these instructions at home: Activity Try to get some form of exercise each day, such as walking. Try yoga, mindfulness, or other stress reduction techniques. Participate in group activities if you are  able. Lifestyle Get enough sleep. Cut down on or stop using caffeine, tobacco, alcohol, and any other harmful substances. Eat a healthy diet that includes plenty of vegetables, fruits, whole grains, low-fat dairy products, and lean protein. Limit foods that are high in solid fats, added sugar, or salt (sodium). General instructions Take over-the-counter and prescription medicines only as told by your health care provider. Keep all follow-up visits. It is important for your health care provider to check on your mood, behavior, and medicines. Your health care provider may need to make changes to your treatment. Where to find support Talking to others  Friends and family members can be sources of support and guidance. Talk to trusted friends or family members about your condition. Explain your symptoms and let them know that you are working with a health care provider to treat your depression. Tell friends and family how they can help. Finances Find mental health providers that fit with your financial situation. Talk with your health care provider if you are worried about access to food, housing, or medicine. Call your insurance company to learn about your co-pays and prescription plan. Where to find more information You can find support in your area from: Anxiety and Depression Association of America (ADAA): adaa.org Mental Health America: mentalhealthamerica.net [employer] on Mental Illness: nami.org Contact a health care provider if: You stop taking your antidepressant medicines, and you have any of these symptoms: Nausea. Headache. Light-headedness. Chills and body aches. Not being able to sleep (insomnia). You or your friends and family think your depression is getting worse. Get help right away if: You have thoughts of hurting yourself or others. Get help right away if you feel like you may hurt yourself or others, or have thoughts about taking your own life. Go to your  nearest emergency room or: Call 911. Call the [place] at [phone])728-8164 or 988. This is open 24 hours a day. Text the Crisis Text Line at 818-796-8718. This information is not intended to replace advice given to you by your health care provider. Make sure you discuss any questions you have with your health care provider. Document Revised: 03/28/2022 Document Reviewed: 03/28/2022 Elsevier Patient Education  [place] Compensation Strategies  Use "WARM" strategy.  W= write it down  A= associate it  R= repeat it  M= make a mental note  2.   You can keep a Social worker.  Use a 3-ring notebook with sections for the following: calendar, important names and phone numbers,  medications, doctors' names/phone numbers, lists/reminders, and a section to journal what you did  each day.   3.    Use a calendar to write appointments down.  4.    Write yourself a schedule for the day.  This can be placed on the calendar or in a separate section of the Memory Notebook.  Keeping a  regular schedule can help memory.  5.    Use medication organizer with sections for each day or morning/evening pills.  You may need help loading it  6.    Keep a basket, or pegboard by the door.  Place items that you need to take out with you in the basket or on the pegboard.  You may  also want to  include a message board for reminders.  7.    Use sticky notes.  Place sticky notes with reminders in a place where the task is performed.  For example: " turn off the  stove" placed by the stove, "lock the door" placed on the door at eye level, " take your medications" on  the bathroom mirror or by the place where you normally take your medications.  8.    Use alarms/timers.  Use while cooking to remind yourself to check on food or as a reminder to take your medicine, or as a  reminder to make a call, or as a reminder to perform another task, etc.

## 2023-01-08 NOTE — Assessment & Plan Note (Signed)
Repeat hgbA1c 

## 2023-01-08 NOTE — Progress Notes (Signed)
Established Patient Visit  Patient: Susan [name]   DOB: 22-May-1924   89 y.o. Female  MRN: [id number] Visit Date: 01/08/2023  Subjective:    Chief Complaint  Patient presents with   Follow-up    Not fasting, forgetful, coughing and sneezing   HPI Dysphagia Chronic, worse with thin liquids and pills. She use thickener powder for honey thick consistency and crushes her pills. She declined referral to speech therapy to re eval swallowing and referral to GI for possible EGD. no fever, no SOB, no chest pain, no change in appetite of fatigue. Advised about swallow precautions Continue to monitor at this time  Essential hypertension BP at goal with losartan and metoprolol BP Readings from Last 3 Encounters:  01/08/23 138/74  10/11/22 128/60  08/02/22 (!) 142/78    Maintain med dose  Hyperglycemia Repeat hgbA1c  Hyperlipidemia Repeat ldl due to non fasting state  Mild cognitive impairment Chronic, waxing at waning, has difficulty finding her words, forgets names and dates sometimes. She lives at an [facility] living facility. She participates in board games, puzzles, soduko, occassionally socializes with other residents to play dominoes and [place]. She frequently has visits with her family. Reports increased depression due to loss of 2close friends in last 69month. previous eval by neurology 2021: "EEG on 02/02/2020 showed mild bifrontal sharp waves without epileptiform activity. Carotid ultrasound and transcranial Doppler studies done on 01/27/2020 were both unremarkable. Recommended to eval every 647month MRI brain and CT head was never completed. No Hx of seizure or TIA Normal TSH, cbc, cmp, hgbA1c.  Recommended referral to therapist. She declined at this time. Also decided to wait on f/up with neurology at this time She [facility]74[facility]   01/08/2023    2:20 PM 05/16/2017   11:16 AM  MMSE - Mini  Mental State Exam  Orientation to time 5 5  Orientation to Place 5 5  Registration 3 3  Attention/ Calculation 5 5  Recall 1 2  Language- name 2 objects 2 2  Language- repeat 1 1  Language- follow 3 step command 3 3  Language- read & follow direction 1 1  Write a sentence 1 1  Copy design 1 1  Total score 28 29    Reviewed medical, surgical, and social history today  Medications: Outpatient Medications Prior to Visit  Medication Sig   acetaminophen (TYLENOL) 650 MG CR tablet Take 650 mg by mouth every 8 (eight) hours as needed for pain.   ALPRAZolam (XANAX) 0.5 MG tablet TAKE 1 TABLET BY MOUTH ONCE DAILY AS NEEDED FOR ANXIETY   aspirin EC 325 MG tablet Take 325 mg by mouth daily.   cholestyramine (QUESTRAN) 4 g packet DISSOLVE & TAKE 1 POWDER BY MOUTH TWICE DAILY   dicyclomine (BENTYL) 10 MG capsule Take 1 capsule by mouth twice daily   diphenoxylate-atropine (LOMOTIL) 2.5-0.025 MG tablet TAKE 1 TABLET BY MOUTH TWICE DAILY AS NEEDED FOR  LOOSE  STOOLS  OR  DIARRHEA   DULoxetine (CYMBALTA) 20 MG capsule Take 1 capsule by mouth once daily   famotidine (PEPCID) 20 MG tablet Take 1 tablet by mouth once daily   fexofenadine (ALLEGRA) 180 MG tablet Take 180 mg by mouth daily.   fluticasone (FLONASE) 50 MCG/ACT nasal spray Use 1 spray(s) in each nostril twice daily   food thickener (RESOURCE THICKENUP CLEAR) POWD Take 1 g  by mouth as needed.   furosemide (LASIX) 20 MG tablet Take 1 tablet (20 mg total) by mouth daily.   IRON PO Take 65 mg by mouth 3 (three) times a week.   losartan (COZAAR) 50 MG tablet Take 2 tablets (100 mg total) by mouth daily.   metoprolol succinate (TOPROL-XL) 25 MG 24 hr tablet Take 1 tablet (25 mg total) by mouth daily.   potassium chloride SA (KLOR-CON M) 20 MEQ tablet Take 1 tablet (20 mEq total) by mouth daily.   sodium chloride (OCEAN) 0.65 % SOLN nasal spray Place 1 spray into both nostrils as needed for congestion.   Vitamin D, Ergocalciferol, (DRISDOL)  1.25 MG (50000 UNIT) CAPS capsule Take 1 capsule by mouth once a week   No facility-administered medications prior to visit.   Reviewed past medical and social history.   ROS per HPI above  Last CBC Lab Results  Component Value Date   WBC 6.8 12/28/2021   HGB 13.1 12/28/2021   HCT 40.1 12/28/2021   MCV 94.0 12/28/2021   MCH 31.3 06/30/2021   RDW 13.5 12/28/2021   PLT 248.0 13/24/4010   Last metabolic panel Lab Results  Component Value Date   GLUCOSE 101 (H) 12/28/2021   NA 140 12/28/2021   K 4.1 12/28/2021   CL 101 12/28/2021   CO2 31 12/28/2021   BUN 23 12/28/2021   CREATININE 0.64 12/28/2021   GFRNONAA >60 06/30/2021   CALCIUM 9.3 12/28/2021   PROT 7.3 12/28/2021   ALBUMIN 4.1 12/28/2021   BILITOT 0.4 12/28/2021   ALKPHOS 75 12/28/2021   AST 25 12/28/2021   ALT 18 12/28/2021   ANIONGAP 9 06/30/2021   Last hemoglobin A1c Lab Results  Component Value Date   HGBA1C 5.4 12/28/2021   Last thyroid functions Lab Results  Component Value Date   TSH 2.50 12/28/2021   Last vitamin D Lab Results  Component Value Date   VD25OH 46.15 02/01/2021   Last vitamin B12 and Folate Lab Results  Component Value Date   VITAMINB12 410 01/14/2020      Objective:  BP 138/74 (BP Location: Right Arm)   Pulse 62   Temp (!) 97.3 F (36.3 C) (Temporal)   Ht '5\' 1"'$  (1.549 m)   Wt 105 lb (47.6 kg)   SpO2 97%   BMI 19.84 kg/m      Physical Exam Vitals reviewed.  Cardiovascular:     Rate and Rhythm: Normal rate and regular rhythm.     Pulses: Normal pulses.     Heart sounds: Normal heart sounds.  Pulmonary:     Effort: Pulmonary effort is normal.     Breath sounds: Normal breath sounds.  Musculoskeletal:     Right lower leg: No edema.     Left lower leg: No edema.  Neurological:     Mental Status: She is alert and oriented to person, place, and time.  Psychiatric:        Mood and Affect: Mood normal.        Behavior: Behavior normal.     No results found for  any visits on 01/08/23.    Assessment & Plan:    Problem List Items Addressed This Visit       Cardiovascular and Mediastinum   Essential hypertension - Primary    BP at goal with losartan and metoprolol BP Readings from Last 3 Encounters:  01/08/23 138/74  10/11/22 128/60  08/02/22 (!) 142/78    Maintain med dose  Relevant Orders   Comprehensive metabolic panel     Digestive   Dysphagia    Chronic, worse with thin liquids and pills. She use thickener powder for honey thick consistency and crushes her pills. She declined referral to speech therapy to re eval swallowing and referral to GI for possible EGD. no fever, no SOB, no chest pain, no change in appetite of fatigue. Advised about swallow precautions Continue to monitor at this time        Other   Hyperglycemia    Repeat hgbA1c      Relevant Orders   Hemoglobin A1c   Hyperlipidemia    Repeat ldl due to non fasting state      Relevant Orders   Direct LDL   Comprehensive metabolic panel   Mild cognitive impairment    Chronic, waxing at waning, has difficulty finding her words, forgets names and dates sometimes. She lives at an [place] living facility. She participates in board games, puzzles, soduko, occassionally socializes with other residents to play dominoes and [employer]. She frequently has visits with her family. Reports increased depression due to loss of 2close friends in last 49month. previous eval by neurology 2021: "EEG on 02/02/2020 showed mild bifrontal sharp waves without epileptiform activity. Carotid ultrasound and transcranial Doppler studies done on 01/27/2020 were both unremarkable. Recommended to eval every 633month MRI brain and CT head was never completed. No Hx of seizure or TIA Normal TSH, cbc, cmp, hgbA1c.  Recommended referral to therapist. She declined at this time. Also decided to wait on f/up with neurology at this time She [facility]    Return in about 6 months (around 07/09/2023) for HTN, hyperlipidemia (fasting).     ChaWilfred LacyP

## 2023-01-08 NOTE — Assessment & Plan Note (Signed)
BP at goal with losartan and metoprolol BP Readings from Last 3 Encounters:  01/08/23 138/74  10/11/22 128/60  08/02/22 (!) 142/78    Maintain med dose

## 2023-01-08 NOTE — Assessment & Plan Note (Signed)
Chronic, worse with thin liquids and pills. She use thickener powder for honey thick consistency and crushes her pills. She declined referral to speech therapy to re eval swallowing and referral to GI for possible EGD. no fever, no SOB, no chest pain, no change in appetite of fatigue. Advised about swallow precautions Continue to monitor at this time

## 2023-01-09 LAB — COMPREHENSIVE METABOLIC PANEL
ALT: 13 U/L (ref 0–35)
AST: 22 U/L (ref 0–37)
Albumin: 3.9 g/dL (ref 3.5–5.2)
Alkaline Phosphatase: 70 U/L (ref 39–117)
BUN: 29 mg/dL — ABNORMAL HIGH (ref 6–23)
CO2: 27 mEq/L (ref 19–32)
Calcium: 9.3 mg/dL (ref 8.4–10.5)
Chloride: 104 mEq/L (ref 96–112)
Creatinine, Ser: 0.64 mg/dL (ref 0.40–1.20)
GFR: 74.75 mL/min (ref >60.00)
Glucose, Bld: 105 mg/dL — ABNORMAL HIGH (ref 70–99)
Potassium: 4.2 mEq/L (ref 3.5–5.1)
Sodium: 143 mEq/L (ref 135–145)
Total Bilirubin: 0.3 mg/dL (ref 0.2–1.2)
Total Protein: 6.9 g/dL (ref 6.0–8.3)

## 2023-01-09 LAB — LDL CHOLESTEROL, DIRECT: Direct LDL: 111 mg/dL

## 2023-01-09 LAB — HEMOGLOBIN A1C: Hgb A1c MFr Bld: 5.6 % (ref 4.6–6.5)

## 2023-01-09 NOTE — Progress Notes (Unsigned)
[place] [facility] Phone: [phone] Subjective:   [name], [name]  I'm seeing this patient by the request  of:  Nche, Charlene Brooke, NP  CC: Left knee pain follow-up  FBP:ZWCHENIDPO  10/11/2022 Patient still seems to be doing very well overall. No significant change in management. Last injection was nearly 8 months ago. I think we can continue to monitor at this time. Follow-up again in 8 week   Updated 01/10/2023 Cindia Hustead is a 89 y.o. female coming in with complaint of L knee pain. She is doing well. Not much pain since last visit.        Past Medical History:  Diagnosis Date   Actinic keratosis, hx of    chest wall (2012)   Allergic rhinitis    Anxiety    Arthritis    "spine, hips" (01/30/2017)   Bursitis    Cellulitis of right foot 01/30/2017   Colon polyps    Diverticulosis    Esophageal dysmotilities 10/16/2017   Fibromyalgia    "qwhere" (01/30/2017)   Gallstones    GERD (gastroesophageal reflux disease)    High cholesterol    History of kidney stones    History of stomach ulcers    Hx of squamous cell carcinoma excision    right mandible (2011), right arm (2014)   Hypertension    IBS (irritable bowel syndrome)    Lymphangitis 12/06/2018   Osteoarthritis    Pneumonia 2000s X 2   "twice" (01/30/2017)   Pyloric stenosis    Stroke ([place]) 10/2014   denies residual on 01/30/2017   Past Surgical History:  Procedure Laterality Date   APPENDECTOMY     CARDIOVASCULAR STRESS TEST  04/2016   nuclear cardiolite stress test done in [place] (no ischemia, no LVH, no LV systolic function)   CATARACT EXTRACTION W/ INTRAOCULAR LENS  IMPLANT, BILATERAL Bilateral    CHOLECYSTECTOMY OPEN     COLONOSCOPY  2009, 2014   DIAGNOSTIC MAMMOGRAM  2009, 2012   TONSILLECTOMY     Social History   Socioeconomic History   Marital status: Widowed    Spouse name: Not on  file   Number of children: 4   Years of education: Not on file   Highest education level: Not on file  Occupational History   Occupation: retired  Tobacco Use   Smoking status: Never   Smokeless tobacco: Never  Vaping Use   Vaping Use: Never used  Substance and Sexual Activity   Alcohol use: Yes    Comment: glass of red wine maybe once week   Drug use: No   Sexual activity: Not on file  Other Topics Concern   Not on file  Social History Narrative   Not on file   Social Determinants of Health   Financial Resource Strain: Low Risk  (05/23/2022)   Overall Financial Resource Strain (CARDIA)    Difficulty of Paying Living Expenses: Not hard at all  Food Insecurity: No Food Insecurity (05/23/2022)   Hunger Vital Sign    Worried About Running Out of Food in the Last Year: Never true    [place] in the Last Year: Never true  Transportation Needs: No Transportation Needs (05/23/2022)   PRAPARE - Hydrologist (Medical): No    Lack of Transportation (Non-Medical): No  Physical Activity: Insufficiently Active (05/23/2022)   Exercise Vital Sign  Days of Exercise per Week: 2 days    Minutes of Exercise per Session: 20 min  Stress: No Stress Concern Present (05/23/2022)   [place]    Feeling of Stress : Not at all  Social Connections: Moderately Integrated (05/23/2022)   Social Connection and Isolation Panel [NHANES]    Frequency of Communication with Friends and Family: Three times a week    Frequency of Social Gatherings with Friends and Family: Three times a week    Attends Religious Services: More than 4 times per year    Active Member of Clubs or Organizations: Yes    Attends Archivist Meetings: 1 to 4 times per year    Marital Status: Widowed   Allergies  Allergen Reactions   Butrans [Buprenorphine] Other (See Comments)    Severe chest pains   Codeine Other (See  Comments)    Severe Chest pains   Morphine And Related Other (See Comments)    Severe chest pains   Family History  Problem Relation Age of Onset   Stroke Mother    Heart attack Mother    Stroke Father    Heart attack Father    Heart attack Maternal Grandmother    Stroke Maternal Grandmother    Stroke Paternal Grandmother    Heart attack Paternal Grandmother    Heart attack Maternal Grandfather    Stroke Maternal Grandfather    Stroke Paternal Grandfather    Heart attack Paternal Grandfather    Colon cancer Neg Hx    Pancreatic cancer Neg Hx    Stomach cancer Neg Hx    Esophageal cancer Neg Hx      Current Outpatient Medications (Cardiovascular):    cholestyramine (QUESTRAN) 4 g packet, DISSOLVE & TAKE 1 POWDER BY MOUTH TWICE DAILY   furosemide (LASIX) 20 MG tablet, Take 1 tablet (20 mg total) by mouth daily.   losartan (COZAAR) 50 MG tablet, Take 2 tablets (100 mg total) by mouth daily.   metoprolol succinate (TOPROL-XL) 25 MG 24 hr tablet, Take 1 tablet (25 mg total) by mouth daily.  Current Outpatient Medications (Respiratory):    fexofenadine (ALLEGRA) 180 MG tablet, Take 180 mg by mouth daily.   fluticasone (FLONASE) 50 MCG/ACT nasal spray, Use 1 spray(s) in each nostril twice daily   sodium chloride (OCEAN) 0.65 % SOLN nasal spray, Place 1 spray into both nostrils as needed for congestion.  Current Outpatient Medications (Analgesics):    acetaminophen (TYLENOL) 650 MG CR tablet, Take 650 mg by mouth every 8 (eight) hours as needed for pain.   aspirin EC 325 MG tablet, Take 325 mg by mouth daily.  Current Outpatient Medications (Hematological):    IRON PO, Take 65 mg by mouth 3 (three) times a week.  Current Outpatient Medications (Other):    ALPRAZolam (XANAX) 0.5 MG tablet, TAKE 1 TABLET BY MOUTH ONCE DAILY AS NEEDED FOR ANXIETY   dicyclomine (BENTYL) 10 MG capsule, Take 1 capsule by mouth twice daily   diphenoxylate-atropine (LOMOTIL) 2.5-0.025 MG tablet, TAKE 1  TABLET BY MOUTH TWICE DAILY AS NEEDED FOR  LOOSE  STOOLS  OR  DIARRHEA   DULoxetine (CYMBALTA) 20 MG capsule, Take 1 capsule by mouth once daily   famotidine (PEPCID) 20 MG tablet, Take 1 tablet by mouth once daily   food thickener (RESOURCE THICKENUP CLEAR) POWD, Take 1 g by mouth as needed.   potassium chloride SA (KLOR-CON M) 20 MEQ tablet, Take 1 tablet (20  mEq total) by mouth daily.   Vitamin D, Ergocalciferol, (DRISDOL) 1.25 MG (50000 UNIT) CAPS capsule, Take 1 capsule by mouth once a week   Reviewed prior external information including notes and imaging from  primary care provider As well as notes that were available from care everywhere and other healthcare systems.  Past medical history, social, surgical and family history all reviewed in electronic medical record.  No pertanent information unless stated regarding to the chief complaint.   Review of Systems:  No headache, visual changes, nausea, vomiting, diarrhea, constipation, dizziness, abdominal pain, skin rash, fevers, chills, night sweats, weight loss, swollen lymph nodes, body aches, joint swelling, chest pain, shortness of breath, mood changes. POSITIVE muscle aches  Objective  Blood pressure (!) 142/82, pulse 65, height '5\' 1"'$  (1.549 m), weight 106 lb (48.1 kg), SpO2 97 %.   General: No apparent distress alert and oriented x3 mood and affect normal, dressed appropriately.  Antalgic gait noted.  Using it of a walker.  Instability noted of the left knee with crepitus.  after informed written and verbal consent, patient was seated on exam table. Left knee was prepped with alcohol swab and utilizing anterolateral approach, patient's left knee space was injected with 4:1  marcaine 0.5%: Kenalog '40mg'$ /dL. Patient tolerated the procedure well without immediate complications.    Impression and Recommendations:      The above documentation has been reviewed and is accurate and complete Lyndal Pulley, DO

## 2023-01-10 ENCOUNTER — Ambulatory Visit (INDEPENDENT_AMBULATORY_CARE_PROVIDER_SITE_OTHER): Payer: Medicare Other | Admitting: Family Medicine

## 2023-01-10 ENCOUNTER — Encounter: Payer: Self-pay | Admitting: Family Medicine

## 2023-01-10 VITALS — BP 142/82 | HR 65 | Ht 61.0 in | Wt 106.0 lb

## 2023-01-10 DIAGNOSIS — M1712 Unilateral primary osteoarthritis, left knee: Secondary | ICD-10-CM

## 2023-01-10 NOTE — Assessment & Plan Note (Signed)
Repeat injection given. Chronic difficulty.  Discussed home exercises and icing regimen.  Patient could be candidate for the viscosupplementation again.  Follow-up again in 6 to 8 weeks

## 2023-01-10 NOTE — Patient Instructions (Signed)
Great to see you as always. Did inject the left knee.  I do think it would be helpful. Overall keep doing what you are doing. See me again in 3 months

## 2023-01-22 ENCOUNTER — Telehealth: Payer: Self-pay | Admitting: Neurology

## 2023-01-22 NOTE — Telephone Encounter (Signed)
Pt is asking for a call to discuss getting in with Dr Leonie Man before the month of Sept. Please call if there is a way for her to be seen before with Dr Leonie Man

## 2023-01-22 NOTE — Telephone Encounter (Signed)
Pt scheduled for 02/22/2023 at 400 pm.

## 2023-01-31 ENCOUNTER — Other Ambulatory Visit: Payer: Self-pay | Admitting: Internal Medicine

## 2023-02-01 ENCOUNTER — Telehealth: Payer: Self-pay | Admitting: Nurse Practitioner

## 2023-02-01 NOTE — Telephone Encounter (Signed)
This medication was not prescribed at our office.  Dr. Henrene Pastor @ [phone] prescribed the medication.  I called the number in the message and was transferred 3 times.  Nobody knew who Hailey was and if they call back  Direct them to Dr. Blanch Media office.

## 2023-02-01 NOTE — Telephone Encounter (Signed)
Caller Name: Wynelle Link w/[employer] Call back phone #: [phone] Case# [id number]   Reason for Call: diphenoxylate rejection due to other med interaction, prior auth required to be submitted, pt is almost out of medication

## 2023-02-02 ENCOUNTER — Telehealth: Payer: Self-pay | Admitting: Nurse Practitioner

## 2023-02-02 NOTE — Telephone Encounter (Signed)
Pt called to notify Susan [name] she is going to see Dr. Leonie Man 02/22/2023 regarding the issues that have been going on. No need for return call unless Susan [name] has questions.

## 2023-02-22 ENCOUNTER — Encounter: Payer: Self-pay | Admitting: Neurology

## 2023-02-22 ENCOUNTER — Ambulatory Visit (INDEPENDENT_AMBULATORY_CARE_PROVIDER_SITE_OTHER): Payer: Medicare Other | Admitting: Neurology

## 2023-02-22 VITALS — BP 174/81 | HR 78 | Ht 61.0 in | Wt 106.0 lb

## 2023-02-22 DIAGNOSIS — G3184 Mild cognitive impairment, so stated: Secondary | ICD-10-CM | POA: Diagnosis not present

## 2023-02-22 DIAGNOSIS — R0989 Other specified symptoms and signs involving the circulatory and respiratory systems: Secondary | ICD-10-CM

## 2023-02-22 DIAGNOSIS — R413 Other amnesia: Secondary | ICD-10-CM

## 2023-02-22 NOTE — Progress Notes (Signed)
[facility] [address] [place]. [place] [address] [phone]       OFFICE FOLLOW-UP NOTE  [name] Date of Birth:  09/22/1924 Medical Record Number:  [id number]   Referring MD: Wilfred Lacy, NP  Reason for Referral: Speech difficulty and TIA  HPI: Initial visit 04/08/2020 Susan [name] is a pleasant 89 year old Caucasian lady seen today for initial office consultation visit accompanied by her daughter Susan [name].  History is obtained from them and review of referral notes and electronic medical records.  No brain specific imaging films available for review.  Susan [name] is a pleasant 89 year old Caucasian lady with past medical history of hypertension and gastroesophageal reflux disease who has noticed for the last several years that she is having some intermittent word finding difficulties and at times she cannot complete her sentences.  This seems to be getting worse the last 5 months or so.  She is still able to complete sentences but had to think about the words.  She is also having difficulty remembering some recent information.  She still quite active and lives in independent living facility.  She can write her own checks and pays the bills and makes her grocery list.  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  She is not driving.  She is still ambulating well with uses a wheeled walker and has had no falls or injuries.  She is worried she may have had a stroke or TIA.  She had an MRI scan of the brain ordered at [facility] but the patient called and canceled appointment since she needed more clarification for why it was necessary.  She denies any prior history of strokes TIAs seizures or significant neurological problems.  She does have strong family history of dementia in her mother as well as grandmother.  She is worried about Alzheimer's.  She had some recent lab work done basic metabolic panel in [id number] which is normal.  TSH on 08/15/2019  was 2.6 and LDL was 56 and A1c 5.7.  No recent brain imaging films available.  She has not had any lab work for reversible causes of cognitive impairment or EEG study. Update 02/22/2023 : Patient returns for follow-up after last visit nearly 3 years ago.  He states her memory and word finding difficulties appear to be getting worse.  He has trouble finding words and often stops midsentence disease.  He does remember the words later.  He is now living in a retirement community but is independent.  He is still managing her own affairs.  She lives alone in independent living section at [facility].  She is still writing her own checks and taking her own 17 medications.  She ambulates with a wheeled walker.  She is pretty safe and has no falls or injuries.  She does participate in social programs does sudoku and solves puzzles daily.  He has no new complaints.  He was seen by primary care physician on 01/08/2023 for 28/30 on Mini-Mental status exam.  Lab work in 01/08/2023 showed hemoglobin A1c 5.6 and LDL cholesterol 1 1 1  mg percent. ROS:   14 system review of systems is positive for speech difficulty, memory difficulties, involuntary movements and all other systems negative PMH:  Past Medical History:  Diagnosis Date   Actinic keratosis, hx of    chest wall (2012)   Allergic rhinitis    Anxiety    Arthritis    "spine, hips" (01/30/2017)   Bursitis    Cellulitis  of right foot 01/30/2017   Colon polyps    Diverticulosis    Esophageal dysmotilities 10/16/2017   Fibromyalgia    "qwhere" (01/30/2017)   Gallstones    GERD (gastroesophageal reflux disease)    High cholesterol    History of kidney stones    History of stomach ulcers    Hx of squamous cell carcinoma excision    right mandible (2011), right arm (2014)   Hypertension    IBS (irritable bowel syndrome)    Osteoarthritis    Pneumonia 2000s X 2   "twice" (01/30/2017)   Pyloric stenosis    Stroke ([place]) 10/2014   denies residual on  01/30/2017    Social History:  Social History   Socioeconomic History   Marital status: Widowed    Spouse name: Not on file   Number of children: 4   Years of education: Not on file   Highest education level: Not on file  Occupational History   Occupation: retired  Tobacco Use   Smoking status: Never Smoker   Smokeless tobacco: Never Used  Substance and Sexual Activity   Alcohol use: Yes    Comment: glass of red wine maybe once week   Drug use: No   Sexual activity: Not on file  Other Topics Concern   Not on file  Social History Narrative   Not on file   Social Determinants of Health   Financial Resource Strain:    Difficulty of Paying Living Expenses: Not on file  Food Insecurity:    Worried About Charity fundraiser in the Last Year: Not on file   [employer] of Food in the Last Year: Not on file  Transportation Needs:    Lack of Transportation (Medical): Not on file   Lack of Transportation (Non-Medical): Not on file  Physical Activity:    Days of Exercise per Week: Not on file   Minutes of Exercise per Session: Not on file  Stress:    Feeling of Stress : Not on file  Social Connections:    Frequency of Communication with Friends and Family: Not on file   Frequency of Social Gatherings with Friends and Family: Not on file   Attends Religious Services: Not on file   Active Member of Clubs or Organizations: Not on file   Attends Archivist Meetings: Not on file   Marital Status: Not on file  Intimate Partner Violence:    Fear of Current or Ex-Partner: Not on file   Emotionally Abused: Not on file   Physically Abused: Not on file   Sexually Abused: Not on file    Medications:   Current Outpatient Medications on File Prior to Visit  Medication Sig Dispense Refill   acetaminophen (TYLENOL) 650 MG CR tablet Take 650 mg by mouth every 8 (eight) hours as needed for pain.     ALPRAZolam (XANAX) 0.5 MG tablet Take 1 tablet (0.5 mg total) by mouth at bedtime  as needed for anxiety. Need office visit for additional refills 30 tablet 0   amLODipine (NORVASC) 5 MG tablet Take 1 tablet (5 mg total) by mouth at bedtime. 90 tablet 3   aspirin EC 325 MG tablet Take 325 mg by mouth daily.     cholestyramine (QUESTRAN) 4 g packet Take 1 packet (4 g total) by mouth daily. 30 each 0   dicyclomine (BENTYL) 10 MG capsule Take 1 capsule by mouth twice daily 180 capsule 0   famotidine (PEPCID) 20 MG tablet Take  1 tablet (20 mg total) by mouth daily. 90 tablet 3   fexofenadine (ALLEGRA) 180 MG tablet Take 180 mg by mouth daily.     furosemide (LASIX) 20 MG tablet Take 1 tablet (20 mg total) by mouth daily. 90 tablet 1   IRON PO Take 65 mg by mouth 3 (three) times a week.     loperamide (IMODIUM) 2 MG capsule Take by mouth as needed for diarrhea or loose stools.     losartan (COZAAR) 100 MG tablet Take 1 tablet (100 mg total) by mouth daily. 90 tablet 3   metoprolol succinate (TOPROL-XL) 25 MG 24 hr tablet Take 1 tablet (25 mg total) by mouth daily. 90 tablet 3   potassium chloride SA (KLOR-CON) 20 MEQ tablet Take 1 tablet (20 mEq total) by mouth daily. 90 tablet 1   Vitamin D, Ergocalciferol, (DRISDOL) 1.25 MG (50000 UT) CAPS capsule Take 1 capsule by mouth once a week 12 capsule 0   No current facility-administered medications on file prior to visit.    Allergies:   Allergies  Allergen Reactions   Butrans [Buprenorphine] Other (See Comments)    Severe chest pains   Codeine Other (See Comments)    Severe Chest pains   Morphine And Related Other (See Comments)    Severe chest pains    Physical Exam General: Frail elderly Caucasian lady seated, in no evident distress Head: head normocephalic and atraumatic.   Neck: supple with soft left carotid bruit.  Cardiovascular: regular rate and rhythm, no murmurs Musculoskeletal: Mild kyphoscoliosis Skin:  no rash/petichiae Vascular:  Normal pulses all extremities  Neurologic Exam Mental Status: Awake and fully  alert. Oriented to place and time. Recent and remote memory intact. Attention span, concentration and fund of knowledge appropriate. Mood and affect appropriate.  No aphasia apraxia or dysarthria.  3 word recall is normal at 3/3.  Clock drawing is 4/4.  She is able to name 11 animals which can walk on 4 legs.  MMSE 28/30 on 01/08/2023 at PCPs office Cranial Nerves: Fundoscopic exam reveals sharp disc margins. Pupils equal, briskly reactive to light. Extraocular movements full without nystagmus.  She has frequent essential blepharospasm with nearly constant closing of her eyelids off and on.  Visual fields full to confrontation. Hearing significantly diminished in the left ear.. Facial sensation intact. Face, tongue, palate moves normally and symmetrically.  Motor: Normal bulk and tone. Normal strength in all tested extremity muscles. Sensory.: intact to touch , pinprick , position and vibratory sensation.  Coordination: Rapid alternating movements normal in all extremities. Finger-to-nose and heel-to-shin performed accurately bilaterally. Gait and Station: Arises from chair without difficulty. Stance is stooped.  Uses a wheeled walker.. Gait mildly unsteady..  Unable to tandem walk.   Reflexes: 1+ and symmetric. Toes downgoing.       ASSESSMENT: 89 year old Caucasian lady with subacute mild word finding and memory difficulties  likely related to age-appropriate mild, memory impairment.  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     PLAN: I had a long discussion with the patient  regarding her mild speech and word finding difficulties which are  consistent with mild age-related cognitive impairment.  I assured her that I did not think she had dementia of Alzheimer's type yet even though she has a strong family history.    She also has essential blepharospasm which also appears mild and quite stable and she does not want Botox  injections at the present  time.    She was encouraged to increase participation in cognitively challenging activities like solving crossword puzzles, playing bridge and sudoku.  We also discussed memory compensation strategies.  Recommend check carotid ultrasound for left carotid bruit.  She was advised to use a wheeled walker at all times for fall and safety.  Return for follow-up in 6 months or call earlier if necessary. Greater than 50% time during this 35-minute consultation visit was spent on counseling and coordination of care about her mild speech difficulties, mild cognitive impairment and blepharospasm and answering questions  Antony Contras, MD Note: This document was prepared with digital dictation and possible smart phrase technology. Any transcriptional errors that result from this process are unintentional.

## 2023-02-22 NOTE — Patient Instructions (Signed)
I had a long discussion with the patient  regarding her mild speech and word finding difficulties which are  consistent with mild age-related cognitive impairment.  I assured her that I did not think she had dementia of Alzheimer's type yet even though she has a strong family history.    She also has essential blepharospasm which also appears mild and quite stable and she does not want Botox injections at the present time.    She was encouraged to increase participation in cognitively challenging activities like solving crossword puzzles, playing bridge and sudoku.  We also discussed memory compensation strategies.  Recommend check carotid ultrasound for left carotid bruit.  She was advised to use a wheeled walker at all times for fall and safety.  Return for follow-up in 6 months or call earlier if necessary.  Memory Compensation Strategies  Use "WARM" strategy.  W= write it down  A= associate it  R= repeat it  M= make a mental note  2.   You can keep a Social worker.  Use a 3-ring notebook with sections for the following: calendar, important names and phone numbers,  medications, doctors' names/phone numbers, lists/reminders, and a section to journal what you did  each day.   3.    Use a calendar to write appointments down.  4.    Write yourself a schedule for the day.  This can be placed on the calendar or in a separate section of the Memory Notebook.  Keeping a  regular schedule can help memory.  5.    Use medication organizer with sections for each day or morning/evening pills.  You may need help loading it  6.    Keep a basket, or pegboard by the door.  Place items that you need to take out with you in the basket or on the pegboard.  You may also want to  include a message board for reminders.  7.    Use sticky notes.  Place sticky notes with reminders in a place where the task is performed.  For example: " turn off the  stove" placed by the stove, "lock the door" placed on the door  at eye level, " take your medications" on  the bathroom mirror or by the place where you normally take your medications.  8.    Use alarms/timers.  Use while cooking to remind yourself to check on food or as a reminder to take your medicine, or as a  reminder to make a call, or as a reminder to perform another task, etc.

## 2023-02-27 ENCOUNTER — Other Ambulatory Visit: Payer: Self-pay | Admitting: Nurse Practitioner

## 2023-02-27 ENCOUNTER — Other Ambulatory Visit: Payer: Self-pay | Admitting: Internal Medicine

## 2023-02-27 DIAGNOSIS — K529 Noninfective gastroenteritis and colitis, unspecified: Secondary | ICD-10-CM

## 2023-03-05 ENCOUNTER — Other Ambulatory Visit: Payer: Self-pay | Admitting: Internal Medicine

## 2023-03-09 ENCOUNTER — Ambulatory Visit (HOSPITAL_COMMUNITY)
Admission: RE | Admit: 2023-03-09 | Discharge: 2023-03-09 | Disposition: A | Discharge status: Home / Self Care | Payer: Medicare Other | Source: Ambulatory Visit | Attending: Neurology | Admitting: Neurology

## 2023-03-09 DIAGNOSIS — R0989 Other specified symptoms and signs involving the circulatory and respiratory systems: Secondary | ICD-10-CM | POA: Diagnosis not present

## 2023-03-12 ENCOUNTER — Other Ambulatory Visit: Payer: Self-pay | Admitting: Family Medicine

## 2023-03-14 ENCOUNTER — Other Ambulatory Visit: Payer: Self-pay | Admitting: Internal Medicine

## 2023-03-18 NOTE — Progress Notes (Signed)
Kindly inform the patient that follow-up carotid ultrasound study shows persistent mild narrowing of the carotid arteries on the right side but this appears to be unchanged from previous study from 2021.  Continue present medical management and have yearly carotid ultrasounds for follow-up

## 2023-03-27 ENCOUNTER — Other Ambulatory Visit: Payer: Self-pay | Admitting: Nurse Practitioner

## 2023-03-27 ENCOUNTER — Other Ambulatory Visit: Payer: Self-pay

## 2023-03-27 DIAGNOSIS — F411 Generalized anxiety disorder: Secondary | ICD-10-CM

## 2023-03-27 DIAGNOSIS — K2100 Gastro-esophageal reflux disease with esophagitis, without bleeding: Secondary | ICD-10-CM

## 2023-03-27 MED ORDER — ALPRAZOLAM 0.5 MG PO TABS
0.5000 mg | ORAL_TABLET | Freq: Every day | ORAL | 5 refills | Status: DC | PRN
Start: 2023-03-27 — End: 2023-10-12

## 2023-04-08 NOTE — Progress Notes (Unsigned)
[name] [address] [place] [address] Phone: [phone] Subjective:   Susan [name], am serving as a scribe for Dr. Antoine Primas.  I'm seeing this patient by the request  of:  Nche, Bonna Gains, NP  CC: Left knee pain  [id number]  01/10/2023 Repeat injection given. Chronic difficulty.  Discussed home exercises and icing regimen.  Patient could be candidate for the viscosupplementation again.  Follow-up again in 6 to 8 weeks     Updated 04/09/2023 Susan [name] is a 89 y.o. female coming in with complaint of L knee pain. Injection last visit helped. No new concerns. Patient states that continues to have some mild discomfort of the knee but nothing severe.  Patient states that she would like to consider another injection if possible so she can remain active.      Past Medical History:  Diagnosis Date   Actinic keratosis, hx of    chest wall (2012)   Allergic rhinitis    Anxiety    Arthritis    "spine, hips" (01/30/2017)   Bursitis    Cellulitis of right foot 01/30/2017   Colon polyps    Diverticulosis    Esophageal dysmotilities 10/16/2017   Fibromyalgia    "qwhere" (01/30/2017)   Gallstones    GERD (gastroesophageal reflux disease)    High cholesterol    History of kidney stones    History of stomach ulcers    Hx of squamous cell carcinoma excision    right mandible (2011), right arm (2014)   Hypertension    IBS (irritable bowel syndrome)    Lymphangitis 12/06/2018   Osteoarthritis    Pneumonia 2000s X 2   "twice" (01/30/2017)   Pyloric stenosis    Stroke ([facility]) 10/2014   denies residual on 01/30/2017   Past Surgical History:  Procedure Laterality Date   APPENDECTOMY     CARDIOVASCULAR STRESS TEST  04/2016   nuclear cardiolite stress test done in [place] (no ischemia, no LVH, no LV systolic function)   CATARACT EXTRACTION W/ INTRAOCULAR LENS  IMPLANT, BILATERAL Bilateral    CHOLECYSTECTOMY OPEN     COLONOSCOPY   2009, 2014   DIAGNOSTIC MAMMOGRAM  2009, 2012   TONSILLECTOMY     Social History   Socioeconomic History   Marital status: Widowed    Spouse name: Not on file   Number of children: 4   Years of education: Not on file   Highest education level: Not on file  Occupational History   Occupation: retired  Tobacco Use   Smoking status: Never   Smokeless tobacco: Never  Vaping Use   Vaping Use: Never used  Substance and Sexual Activity   Alcohol use: Yes    Comment: glass of red wine maybe once week   Drug use: No   Sexual activity: Not on file  Other Topics Concern   Not on file  Social History Narrative   Not on file   Social Determinants of Health   Financial Resource Strain: Low Risk  (05/23/2022)   Overall Financial Resource Strain (CARDIA)    Difficulty of Paying Living Expenses: Not hard at all  Food Insecurity: No Food Insecurity (05/23/2022)   Hunger Vital Sign    Worried About Running Out of Food in the Last Year: Never true    Ran Out of Food in the Last Year: Never true  Transportation Needs: No Transportation Needs (05/23/2022)   PRAPARE - Transportation    Lack of Transportation (  Medical): No    Lack of Transportation (Non-Medical): No  Physical Activity: Insufficiently Active (05/23/2022)   Exercise Vital Sign    Days of Exercise per Week: 2 days    Minutes of Exercise per Session: 20 min  Stress: No Stress Concern Present (05/23/2022)   [employer] of Occupational Health - Occupational Stress Questionnaire    Feeling of Stress : Not at all  Social Connections: Moderately Integrated (05/23/2022)   Social Connection and Isolation Panel [NHANES]    Frequency of Communication with Friends and Family: Three times a week    Frequency of Social Gatherings with Friends and Family: Three times a week    Attends Religious Services: More than 4 times per year    Active Member of Clubs or Organizations: Yes    Attends Banker Meetings: 1 to 4 times per  year    Marital Status: Widowed   Allergies  Allergen Reactions   Butrans [Buprenorphine] Other (See Comments)    Severe chest pains   Codeine Other (See Comments)    Severe Chest pains   Morphine And Related Other (See Comments)    Severe chest pains   Family History  Problem Relation Age of Onset   Stroke Mother    Heart attack Mother    Stroke Father    Heart attack Father    Heart attack Maternal Grandmother    Stroke Maternal Grandmother    Stroke Paternal Grandmother    Heart attack Paternal Grandmother    Heart attack Maternal Grandfather    Stroke Maternal Grandfather    Stroke Paternal Grandfather    Heart attack Paternal Grandfather    Colon cancer Neg Hx    Pancreatic cancer Neg Hx    Stomach cancer Neg Hx    Esophageal cancer Neg Hx      Current Outpatient Medications (Cardiovascular):    cholestyramine (QUESTRAN) 4 g packet, DISSOLVE & TAKE 1 POWDER BY MOUTH TWICE DAILY   furosemide (LASIX) 20 MG tablet, Take 1 tablet (20 mg total) by mouth daily.   losartan (COZAAR) 50 MG tablet, Take 2 tablets (100 mg total) by mouth daily.   metoprolol succinate (TOPROL-XL) 25 MG 24 hr tablet, Take 1 tablet (25 mg total) by mouth daily.  Current Outpatient Medications (Respiratory):    fexofenadine (ALLEGRA) 180 MG tablet, Take 180 mg by mouth daily.   fluticasone (FLONASE) 50 MCG/ACT nasal spray, Use 1 spray(s) in each nostril twice daily   sodium chloride (OCEAN) 0.65 % SOLN nasal spray, Place 1 spray into both nostrils as needed for congestion.  Current Outpatient Medications (Analgesics):    acetaminophen (TYLENOL) 650 MG CR tablet, Take 650 mg by mouth every 8 (eight) hours as needed for pain.   aspirin EC 325 MG tablet, Take 325 mg by mouth daily.  Current Outpatient Medications (Hematological):    IRON PO, Take 65 mg by mouth 3 (three) times a week.  Current Outpatient Medications (Other):    ALPRAZolam (XANAX) 0.5 MG tablet, Take 1 tablet (0.5 mg total) by  mouth daily as needed for anxiety.   dicyclomine (BENTYL) 10 MG capsule, Take 1 capsule by mouth twice daily   diphenoxylate-atropine (LOMOTIL) 2.5-0.025 MG tablet, TAKE 1 TABLET BY MOUTH TWICE DAILY AS NEEDED FOR DIARRHEA OR LOOSE STOOLS   DULoxetine (CYMBALTA) 20 MG capsule, Take 1 capsule by mouth once daily   famotidine (PEPCID) 20 MG tablet, Take 1 tablet by mouth once daily   potassium chloride SA (KLOR-CON M)  20 MEQ tablet, Take 1 tablet (20 mEq total) by mouth daily.   Vitamin D, Ergocalciferol, (DRISDOL) 1.25 MG (50000 UNIT) CAPS capsule, Take 1 capsule by mouth once a week   Reviewed prior external information including notes and imaging from  primary care provider As well as notes that were available from care everywhere and other healthcare systems.  Past medical history, social, surgical and family history all reviewed in electronic medical record.  No pertanent information unless stated regarding to the chief complaint.   Review of Systems:  No headache, visual changes, nausea, vomiting, diarrhea, constipation, dizziness, abdominal pain, skin rash, fevers, chills, night sweats, weight loss, swollen lymph nodes, body aches,  chest pain, shortness of breath, mood changes. POSITIVE muscle aches, joint swelling  Objective  Blood pressure 124/76, pulse 81, height 5\' 1"  (1.549 m), weight 108 lb (49 kg), SpO2 95 %.   General: No apparent distress alert and oriented x3 mood and affect normal, dressed appropriately.  Patient's left knee does have some crepitus noted.  Patient does have some mild instability with valgus and varus force.  Trace effusion noted but nothing severe.  Edema, non tender, no erythema ambulates with the aid of a walker.  After informed written and verbal consent, patient was seated on exam table. Left knee was prepped with alcohol swab and utilizing anterolateral approach, patient's left knee space was injected with 4:1  marcaine 0.5%: Kenalog 40mg /dL. Patient  tolerated the procedure well without immediate complications.   Impression and Recommendations:     The above documentation has been reviewed and is accurate and complete Judi Saa, DO

## 2023-04-09 ENCOUNTER — Ambulatory Visit (INDEPENDENT_AMBULATORY_CARE_PROVIDER_SITE_OTHER): Payer: Medicare Other | Admitting: Family Medicine

## 2023-04-09 ENCOUNTER — Encounter: Payer: Self-pay | Admitting: Family Medicine

## 2023-04-09 VITALS — BP 124/76 | HR 81 | Ht 61.0 in | Wt 108.0 lb

## 2023-04-09 DIAGNOSIS — M1712 Unilateral primary osteoarthritis, left knee: Secondary | ICD-10-CM

## 2023-04-09 NOTE — Assessment & Plan Note (Signed)
Patient is ambulatory but uses the aid of a walker.  Discussed with patient to maybe keep these injections to every 3 to 6 months.  Patient would like to be active and feels like it is helpful for this.  Follow-up with me again in 3 months.  Continue to be active in the house with home exercises.

## 2023-04-09 NOTE — Patient Instructions (Addendum)
Injection in knee today Good to see you! Choline 500mg  daily See you again in 3 months

## 2023-04-17 ENCOUNTER — Other Ambulatory Visit: Payer: Self-pay

## 2023-04-17 ENCOUNTER — Emergency Department (HOSPITAL_BASED_OUTPATIENT_CLINIC_OR_DEPARTMENT_OTHER)
Admission: EM | Admit: 2023-04-17 | Discharge: 2023-04-17 | Discharge status: Against Medical Advice | Payer: Medicare Other | Attending: Emergency Medicine | Admitting: Emergency Medicine

## 2023-04-17 DIAGNOSIS — S8992XA Unspecified injury of left lower leg, initial encounter: Secondary | ICD-10-CM | POA: Insufficient documentation

## 2023-04-17 DIAGNOSIS — Z5321 Procedure and treatment not carried out due to patient leaving prior to being seen by health care provider: Secondary | ICD-10-CM | POA: Insufficient documentation

## 2023-04-17 DIAGNOSIS — W228XXA Striking against or struck by other objects, initial encounter: Secondary | ICD-10-CM | POA: Diagnosis not present

## 2023-04-17 NOTE — ED Triage Notes (Signed)
Patient presents to ED via POV from home. Here with left leg injury after hitting it on the car door. Skin tear with dried blood noted. Denies fall. Takes a daily ASA.

## 2023-04-24 ENCOUNTER — Encounter (HOSPITAL_BASED_OUTPATIENT_CLINIC_OR_DEPARTMENT_OTHER): Payer: Medicare Other | Attending: Internal Medicine | Admitting: Internal Medicine

## 2023-04-24 DIAGNOSIS — I87312 Chronic venous hypertension (idiopathic) with ulcer of left lower extremity: Secondary | ICD-10-CM | POA: Diagnosis not present

## 2023-04-24 DIAGNOSIS — I872 Venous insufficiency (chronic) (peripheral): Secondary | ICD-10-CM | POA: Diagnosis not present

## 2023-04-24 DIAGNOSIS — M797 Fibromyalgia: Secondary | ICD-10-CM | POA: Diagnosis not present

## 2023-04-24 DIAGNOSIS — H3530 Unspecified macular degeneration: Secondary | ICD-10-CM | POA: Diagnosis not present

## 2023-04-24 DIAGNOSIS — W2209XA Striking against other stationary object, initial encounter: Secondary | ICD-10-CM | POA: Diagnosis not present

## 2023-04-24 DIAGNOSIS — T798XXA Other early complications of trauma, initial encounter: Secondary | ICD-10-CM

## 2023-04-24 DIAGNOSIS — Z9049 Acquired absence of other specified parts of digestive tract: Secondary | ICD-10-CM | POA: Diagnosis not present

## 2023-04-24 DIAGNOSIS — M1990 Unspecified osteoarthritis, unspecified site: Secondary | ICD-10-CM | POA: Insufficient documentation

## 2023-04-24 DIAGNOSIS — K589 Irritable bowel syndrome without diarrhea: Secondary | ICD-10-CM | POA: Insufficient documentation

## 2023-04-24 DIAGNOSIS — Z9089 Acquired absence of other organs: Secondary | ICD-10-CM | POA: Insufficient documentation

## 2023-04-24 DIAGNOSIS — L97822 Non-pressure chronic ulcer of other part of left lower leg with fat layer exposed: Secondary | ICD-10-CM | POA: Insufficient documentation

## 2023-04-24 DIAGNOSIS — Z09 Encounter for follow-up examination after completed treatment for conditions other than malignant neoplasm: Secondary | ICD-10-CM | POA: Diagnosis not present

## 2023-04-24 DIAGNOSIS — K219 Gastro-esophageal reflux disease without esophagitis: Secondary | ICD-10-CM | POA: Diagnosis not present

## 2023-04-24 DIAGNOSIS — F419 Anxiety disorder, unspecified: Secondary | ICD-10-CM | POA: Insufficient documentation

## 2023-04-24 DIAGNOSIS — Z8673 Personal history of transient ischemic attack (TIA), and cerebral infarction without residual deficits: Secondary | ICD-10-CM | POA: Insufficient documentation

## 2023-04-24 DIAGNOSIS — I10 Essential (primary) hypertension: Secondary | ICD-10-CM | POA: Diagnosis not present

## 2023-04-27 NOTE — Progress Notes (Signed)
Susan [name], Susan [name] ([id number]) [id number] Page 1 of 4 Visit Report for 04/24/2023 Abuse Risk Screen Details Patient Name: Date of Service: Susan [name], Susan [name] 04/24/2023 1:15 PM Medical Record Number: [id number] Patient Account Number: [id number] Date of Birth/Sex: Treating RN: Jul 08, 1924 (89 y.o. F) Primary Care Delanie Tirrell: Alysia Penna Other Clinician: Referring Phynix Horton: Treating Deantae Shackleton/Extender: Kerby Nora in Treatment: 0 Abuse Risk Screen Items Answer ABUSE RISK SCREEN: Has anyone close to you tried to hurt or harm you recentlyo No Do you feel uncomfortable with anyone in your familyo No Has anyone forced you do things that you didnt want to doo No Electronic Signature(s) Signed: 04/27/2023 11:53:23 AM By: Thayer Dallas Entered By: Thayer Dallas on 04/24/2023 14:06:09 -------------------------------------------------------------------------------- Activities of Daily Living Details Patient Name: Date of Service: Susan [name], Susan [name] 04/24/2023 1:15 PM Medical Record Number: [id number] Patient Account Number: [id number] Date of Birth/Sex: Treating RN: [birth year] (89 y.o. F) Primary Care Queen Abbett: Alysia Penna Other Clinician: Referring Kellianne Ek: Treating Diahann Guajardo/Extender: Kerby Nora in Treatment: 0 Activities of Daily Living Items Answer Activities of Daily Living (Please select one for each item) Drive Automobile Not Able T Medications ake Completely Able Use T elephone Completely Able Care for Appearance Completely Able Use T oilet Completely Able Bath / Shower Completely Able Dress Self Completely Able Feed Self Completely Able Walk Completely Able Get In / Out Bed Completely Able Housework Completely Able Prepare Meals Completely Able Handle Money Completely Able Shop for Self Need Assistance Electronic Signature(s) Signed: 04/27/2023 11:53:23 AM By: Thayer Dallas Entered By:  Thayer Dallas on 04/24/2023 14:05:50 -------------------------------------------------------------------------------- Education Screening Details Patient Name: Date of Service: Susan [name], Susan [name] 04/24/2023 1:15 PM Medical Record Number: [id number] Patient Account Number: [id number] Date of Birth/Sex: Treating RN: October 10, 1924 (89 y.o. F) Primary Care Latorya Bautch: Alysia Penna Other Clinician: Referring Martie Fulgham: Treating Sedona Wenk/Extender: Kerby Nora in Treatment: 0 [place], [place] ([id number]) [id number] Page 2 of 4 Primary Learner Assessed: Patient Learning Preferences/Education Level/Primary Language Learning Preference: Explanation, Printed Material Highest Education Level: High School Preferred Language: Economist Language Barrier: No Translator Needed: No Memory Deficit: No Emotional Barrier: No Cultural/Religious Beliefs Affecting Medical Care: No Physical Barrier Impaired Vision: Yes Glasses Impaired Hearing: Yes Hearing Aid Decreased Hand dexterity: No Knowledge/Comprehension Knowledge Level: High Comprehension Level: High Ability to understand written instructions: High Ability to understand verbal instructions: High Motivation Anxiety Level: Calm Cooperation: Cooperative Education Importance: Acknowledges Need Interest in Health Problems: Asks Questions Perception: Coherent Willingness to Engage in Self-Management High Activities: Readiness to Engage in Self-Management High Activities: Electronic Signature(s) Signed: 04/27/2023 11:53:23 AM By: Thayer Dallas Entered By: Thayer Dallas on 04/24/2023 14:10:18 -------------------------------------------------------------------------------- Fall Risk Assessment Details Patient Name: Date of Service: Susan [name], Susan [name] 04/24/2023 1:15 PM Medical Record Number: [id number] Patient Account Number: [id number] Date of Birth/Sex: Treating  RN: 22-Jun-1924 (89 y.o. F) Primary Care Akari Defelice: Alysia Penna Other Clinician: Referring Lemuel Boodram: Treating Armani Gawlik/Extender: Kerby Nora in Treatment: 0 Fall Risk Assessment Items Have you had 2 or more falls in the last 12 monthso 0 No Have you had any fall that resulted in injury in the last 12 monthso 0 No FALLS RISK SCREEN History of falling - immediate or within 3 months 0 No Secondary diagnosis (Do you have 2 or more medical diagnoseso) 0 No Ambulatory aid None/bed rest/wheelchair/nurse 0 No Crutches/cane/walker 15 Yes Furniture 0 No Intravenous therapy Access/Saline/Heparin Lock 0 No Gait/Transferring Normal/ bed rest/ wheelchair 0 Yes Weak (short steps with or without shuffle, stooped but able  to lift head while walking, may seek 0 No support from furniture) Impaired (short steps with shuffle, may have difficulty arising from chair, head down, impaired 0 No balance) Mental Status Oriented to own ability 0 Yes Overestimates or forgets limitations 0 No Risk Level: Low Risk Score: 15 Micciche, Bernardette ([id number]) [phone] [id number] Page 3 of 4 Electronic Signature(s) -------------------------------------------------------------------------------- Foot Assessment Details Patient Name: Date of Service: Susan [name], Susan [name] 04/24/2023 1:15 PM Medical Record Number: [id number] Patient Account Number: [id number] Date of Birth/Sex: Treating RN: 1924/01/15 (89 y.o. F) Primary Care Young Brim: Alysia Penna Other Clinician: Referring Shamila Lerch: Treating Talibah Colasurdo/Extender: Kerby Nora in Treatment: 0 Foot Assessment Items Site Locations + = Sensation present, - = Sensation absent, C = Callus, U = Ulcer R = Redness, W = Warmth, M = Maceration, PU = Pre-ulcerative lesion F = Fissure, S = Swelling, D = Dryness Assessment Right: Left: Other Deformity: No No Prior Foot Ulcer: No No Prior Amputation:  No No Charcot Joint: No No Ambulatory Status: Ambulatory With Help Assistance Device: Walker Gait: Steady Electronic Signature(s) Signed: 04/27/2023 11:53:23 AM By: Thayer Dallas Entered By: Thayer Dallas on 04/24/2023 14:13:35 -------------------------------------------------------------------------------- Nutrition Risk Screening Details Patient Name: Date of Service: Susan [name], Susan [name] 04/24/2023 1:15 PM Medical Record Number: [id number] Patient Account Number: [id number] Date of Birth/Sex: Treating RN: [birth year] (89 y.o. F) Primary Care Ataya Murdy: Alysia Penna Other Clinician: Referring Kelyn Ponciano: Treating Jahon Bart/Extender: Kerby Nora in Treatment: 0 Height (in): 61 Weight (lbs): 107 Body Mass Index (BMI): 20.2 Purohit, Zyion ([id number]) [phone] [id number] Page 4 of 4 Nutrition Risk Screening Items Score Screening NUTRITION RISK SCREEN: I have an illness or condition that made me change the kind and/or amount of food I eat 0 No I eat fewer than two meals per day 0 No I eat few fruits and vegetables, or milk products 0 No I have three or more drinks of beer, liquor or wine almost every day 0 No I have tooth or mouth problems that make it hard for me to eat 0 No I don't always have enough money to buy the food I need 0 No I eat alone most of the time 0 No I take three or more different prescribed or over-the-counter drugs a day 1 Yes Without wanting to, I have lost or gained 10 pounds in the last six months 0 No I am not always physically able to shop, cook and/or feed myself 0 No Nutrition Protocols Good Risk Protocol 0 No interventions needed Moderate Risk Protocol High Risk Proctocol Risk Level: Good Risk Score: 1 Electronic Signature(s) Signed: 04/27/2023 11:53:23 AM By: Thayer Dallas Entered By: Thayer Dallas on 04/24/2023 14:11:55

## 2023-05-03 ENCOUNTER — Encounter (HOSPITAL_BASED_OUTPATIENT_CLINIC_OR_DEPARTMENT_OTHER): Payer: Medicare Other | Admitting: Internal Medicine

## 2023-05-03 DIAGNOSIS — Z8673 Personal history of transient ischemic attack (TIA), and cerebral infarction without residual deficits: Secondary | ICD-10-CM | POA: Diagnosis not present

## 2023-05-03 DIAGNOSIS — M1990 Unspecified osteoarthritis, unspecified site: Secondary | ICD-10-CM | POA: Diagnosis not present

## 2023-05-03 DIAGNOSIS — I10 Essential (primary) hypertension: Secondary | ICD-10-CM | POA: Diagnosis not present

## 2023-05-03 DIAGNOSIS — L97822 Non-pressure chronic ulcer of other part of left lower leg with fat layer exposed: Secondary | ICD-10-CM | POA: Diagnosis not present

## 2023-05-03 DIAGNOSIS — I87312 Chronic venous hypertension (idiopathic) with ulcer of left lower extremity: Secondary | ICD-10-CM

## 2023-05-03 DIAGNOSIS — I872 Venous insufficiency (chronic) (peripheral): Secondary | ICD-10-CM | POA: Diagnosis not present

## 2023-05-03 DIAGNOSIS — H3530 Unspecified macular degeneration: Secondary | ICD-10-CM | POA: Diagnosis not present

## 2023-05-11 ENCOUNTER — Encounter (HOSPITAL_BASED_OUTPATIENT_CLINIC_OR_DEPARTMENT_OTHER): Payer: Medicare Other | Attending: Internal Medicine | Admitting: Internal Medicine

## 2023-05-11 DIAGNOSIS — M797 Fibromyalgia: Secondary | ICD-10-CM | POA: Diagnosis not present

## 2023-05-11 DIAGNOSIS — L97822 Non-pressure chronic ulcer of other part of left lower leg with fat layer exposed: Secondary | ICD-10-CM | POA: Diagnosis not present

## 2023-05-11 DIAGNOSIS — I872 Venous insufficiency (chronic) (peripheral): Secondary | ICD-10-CM | POA: Insufficient documentation

## 2023-05-11 DIAGNOSIS — Z8673 Personal history of transient ischemic attack (TIA), and cerebral infarction without residual deficits: Secondary | ICD-10-CM | POA: Diagnosis not present

## 2023-05-11 DIAGNOSIS — M1990 Unspecified osteoarthritis, unspecified site: Secondary | ICD-10-CM | POA: Insufficient documentation

## 2023-05-11 DIAGNOSIS — I87312 Chronic venous hypertension (idiopathic) with ulcer of left lower extremity: Secondary | ICD-10-CM | POA: Insufficient documentation

## 2023-05-11 DIAGNOSIS — I10 Essential (primary) hypertension: Secondary | ICD-10-CM | POA: Diagnosis not present

## 2023-05-17 ENCOUNTER — Encounter (HOSPITAL_BASED_OUTPATIENT_CLINIC_OR_DEPARTMENT_OTHER): Payer: Medicare Other | Admitting: Internal Medicine

## 2023-05-17 DIAGNOSIS — I87312 Chronic venous hypertension (idiopathic) with ulcer of left lower extremity: Secondary | ICD-10-CM

## 2023-05-17 DIAGNOSIS — L97822 Non-pressure chronic ulcer of other part of left lower leg with fat layer exposed: Secondary | ICD-10-CM | POA: Diagnosis not present

## 2023-05-17 DIAGNOSIS — M797 Fibromyalgia: Secondary | ICD-10-CM | POA: Diagnosis not present

## 2023-05-17 DIAGNOSIS — Z8673 Personal history of transient ischemic attack (TIA), and cerebral infarction without residual deficits: Secondary | ICD-10-CM | POA: Diagnosis not present

## 2023-05-17 DIAGNOSIS — I10 Essential (primary) hypertension: Secondary | ICD-10-CM | POA: Diagnosis not present

## 2023-05-17 DIAGNOSIS — I872 Venous insufficiency (chronic) (peripheral): Secondary | ICD-10-CM | POA: Diagnosis not present

## 2023-05-17 DIAGNOSIS — T798XXA Other early complications of trauma, initial encounter: Secondary | ICD-10-CM

## 2023-05-19 NOTE — Progress Notes (Signed)
NELIAH, DEBELLO ([id number]) [id number] Page 1 of 8 Visit Report for 05/17/2023 Arrival Information Details Patient Name: Date of Service: MARITERE, BRUNJES 05/17/2023 1:15 PM Medical Record Number: [id number] Patient Account Number: [id number] Date of Birth/Sex: Treating RN: Sep 17, 1924 (89 y.o. Orville Govern Primary Care Javin Nong: Alysia Penna Other Clinician: Referring Avy Barlett: Treating Keionna Kinnaird/Extender: Kerby Nora in Treatment: 3 Visit Information History Since Last Visit Added or deleted any medications: No Patient Arrived: Dan Humphreys Any new allergies or adverse reactions: No Arrival Time: 13:15 Had a fall or experienced change in No Accompanied By: SELF activities of daily living that may affect Transfer Assistance: None risk of falls: Patient Requires Transmission-Based Precautions: No Signs or symptoms of abuse/neglect since last visito No Patient Has Alerts: No Hospitalized since last visit: No Implantable device outside of the clinic excluding No cellular tissue based products placed in the center since last visit: Has Dressing in Place as Prescribed: Yes Has Compression in Place as Prescribed: Yes Pain Present Now: No Electronic Signature(s) Signed: 05/17/2023 4:11:58 PM By: Redmond Pulling RN, BSN Entered By: Redmond Pulling on 05/17/2023 13:15:55 -------------------------------------------------------------------------------- Clinic Level of Care Assessment Details Patient Name: Date of Service: SANNA, GUNBY 05/17/2023 1:15 PM Medical Record Number: [id number] Patient Account Number: [id number] Date of Birth/Sex: Treating RN: Jun 03, 1924 (89 y.o. Orville Govern Primary Care Hadrian Yarbrough: Alysia Penna Other Clinician: Referring Lovenia Debruler: Treating Derrich Gaby/Extender: Kerby Nora in Treatment: 3 Clinic Level of Care Assessment Items TOOL 4 Quantity Score X- 1 0 Use when only an  EandM is performed on FOLLOW-UP visit ASSESSMENTS - Nursing Assessment / Reassessment X- 1 10 Reassessment of Co-morbidities (includes updates in patient status) X- 1 5 Reassessment of Adherence to Treatment Plan ASSESSMENTS - Wound and Skin A ssessment / Reassessment X - Simple Wound Assessment / Reassessment - one wound 1 5 []  - 0 Complex Wound Assessment / Reassessment - multiple wounds []  - 0 Dermatologic / Skin Assessment (not related to wound area) ASSESSMENTS - Focused Assessment X- 1 5 Circumferential Edema Measurements - multi extremities []  - 0 Nutritional Assessment / Counseling / Intervention Solon, Kathie Rhodes ([id number]) [id number] Page 2 of 8 []  - 0 Lower Extremity Assessment (monofilament, tuning fork, pulses) []  - 0 Peripheral Arterial Disease Assessment (using hand held doppler) ASSESSMENTS - Ostomy and/or Continence Assessment and Care []  - 0 Incontinence Assessment and Management []  - 0 Ostomy Care Assessment and Management (repouching, etc.) PROCESS - Coordination of Care X - Simple Patient / Family Education for ongoing care 1 15 []  - 0 Complex (extensive) Patient / Family Education for ongoing care X- 1 10 Staff obtains Chiropractor, Records, T Results / Process Orders est []  - 0 Staff telephones HHA, Nursing Homes / Clarify orders / etc []  - 0 Routine Transfer to another Facility (non-emergent condition) []  - 0 Routine Hospital Admission (non-emergent condition) []  - 0 New Admissions / Manufacturing engineer / Ordering NPWT Apligraf, etc. , []  - 0 Emergency Hospital Admission (emergent condition) []  - 0 Simple Discharge Coordination []  - 0 Complex (extensive) Discharge Coordination PROCESS - Special Needs []  - 0 Pediatric / Minor Patient Management []  - 0 Isolation Patient Management []  - 0 Hearing / Language / Visual special needs []  - 0 Assessment of Community assistance (transportation, D/C planning, etc.) []  -  0 Additional assistance / Altered mentation []  - 0 Support Surface(s) Assessment (bed, cushion, seat, etc.) INTERVENTIONS - Wound Cleansing / Measurement X - Simple Wound Cleansing - one wound 1 5 []  - 0  Complex Wound Cleansing - multiple wounds X- 1 5 Wound Imaging (photographs - any number of wounds) []  - 0 Wound Tracing (instead of photographs) X- 1 5 Simple Wound Measurement - one wound []  - 0 Complex Wound Measurement - multiple wounds INTERVENTIONS - Wound Dressings X - Small Wound Dressing one or multiple wounds 1 10 []  - 0 Medium Wound Dressing one or multiple wounds []  - 0 Large Wound Dressing one or multiple wounds []  - 0 Application of Medications - topical []  - 0 Application of Medications - injection INTERVENTIONS - Miscellaneous []  - 0 External ear exam []  - 0 Specimen Collection (cultures, biopsies, blood, body fluids, etc.) []  - 0 Specimen(s) / Culture(s) sent or taken to Lab for analysis []  - 0 Patient Transfer (multiple staff / Nurse, adult / Similar devices) []  - 0 Simple Staple / Suture removal (25 or less) []  - 0 Complex Staple / Suture removal (26 or more) []  - 0 Hypo / Hyperglycemic Management (close monitor of Blood Glucose) Andalon, Denette ([id number]) [id number] Page 3 of 8 []  - 0 Ankle / Brachial Index (ABI) - do not check if billed separately X- 1 5 Vital Signs Has the patient been seen at the hospital within the last three years: Yes Total Score: 80 Level Of Care: New/Established - Level 3 Electronic Signature(s) Signed: 05/17/2023 4:11:58 PM By: Redmond Pulling RN, BSN Entered By: Redmond Pulling on 05/17/2023 13:41:31 -------------------------------------------------------------------------------- Encounter Discharge Information Details Patient Name: Date of Service: EVAH, BUTRICK 05/17/2023 1:15 PM Medical Record Number: [id number] Patient Account Number: [id number] Date of Birth/Sex: Treating RN: 05/28/1924  (89 y.o. Orville Govern Primary Care Roseanne Juenger: Alysia Penna Other Clinician: Referring Karilynn Carranza: Treating Merric Yost/Extender: Kerby Nora in Treatment: 3 Encounter Discharge Information Items Discharge Condition: Stable Ambulatory Status: Walker Discharge Destination: Home Transportation: Private Auto Accompanied By: SELF Schedule Follow-up Appointment: Yes Clinical Summary of Care: Patient Declined Electronic Signature(s) Signed: 05/17/2023 4:11:58 PM By: Redmond Pulling RN, BSN Entered By: Redmond Pulling on 05/17/2023 13:42:17 -------------------------------------------------------------------------------- Lower Extremity Assessment Details Patient Name: Date of Service: SEPHRA, PERALTA 05/17/2023 1:15 PM Medical Record Number: [id number] Patient Account Number: [id number] Date of Birth/Sex: Treating RN: 30-May-1924 (89 y.o. Orville Govern Primary Care Anneliese Leblond: Alysia Penna Other Clinician: Referring Holdan Stucke: Treating Kathye Cipriani/Extender: Kerby Nora in Treatment: 3 Edema Assessment Assessed: [Left: Yes] [Right: No] [Left: Edema] [Right: :] Calf Left: Right: Point of Measurement: 28 cm From Medial Instep 24.2 cm Ankle Left: Right: Point of Measurement: 10 cm From Medial Instep 17 cm Vascular Assessment Left: [[id number] Page 4 of 8Right:] Pulses: Dorsalis Pedis Palpable: [id number] Page 4 of 8Yes] Electronic Signature(s) Signed: 05/17/2023 4:11:58 PM By: Redmond Pulling RN, BSN Entered By: Redmond Pulling on 05/17/2023 13:22:18 -------------------------------------------------------------------------------- Multi Wound Chart Details Patient Name: Date of Service: TAIGE, ANDREWS 05/17/2023 1:15 PM Medical Record Number: [id number] Patient Account Number: [id number] Date of Birth/Sex: Treating RN: [birth year] (89 y.o. F) Primary Care Derotha Fishbaugh: Alysia Penna Other Clinician: Referring Paulette Rockford: Treating Priti Consoli/Extender: Kerby Nora in Treatment: 3 Vital Signs Height(in): 61 Pulse(bpm): 54 Weight(lbs): 107 Blood Pressure(mmHg): 146/73 Body Mass Index(BMI): 20.2 Temperature(F): 98.4 Respiratory Rate(breaths/min): 16 [6:Photos:] [N/A:N/A] Left, Lateral Lower Leg N/A N/A Wound Location: Trauma N/A N/A Wounding Event: Trauma, Other N/A N/A Primary Etiology: Cataracts, Hypertension, Peripheral N/A N/A Comorbid History: Venous Disease, Osteoarthritis 04/17/2023 N/A N/A Date Acquired: 3 N/A N/A Weeks of Treatment: Open N/A N/A Wound Status: No N/A N/A Wound Recurrence: 0.5x0.7x0.1 N/A N/A Measurements L x  W x D (cm) 0.275 N/A N/A A (cm) : rea 0.027 N/A N/A Volume (cm) : 97.30% N/A N/A % Reduction in Area: 97.30% N/A N/A % Reduction in Volume: Full Thickness Without Exposed N/A N/A Classification: Support Structures Small N/A N/A Exudate A mount: Serous N/A N/A Exudate Type: amber N/A N/A Exudate Color: Large (67-100%) N/A N/A Granulation A mount: Red, Hyper-granulation N/A N/A Granulation Quality: None Present (0%) N/A N/A Necrotic A mount: Fat Layer (Subcutaneous Tissue): Yes N/A N/A Exposed Structures: Small (1-33%) N/A N/A Epithelialization: Excoriation: No N/A N/A Periwound Skin Texture: Induration: No Callus: No Crepitus: No Rash: No Scarring: No Maceration: No N/A N/A Periwound Skin Moisture: Dry/Scaly: No Atrophie Blanche: No N/A N/A Periwound Skin Color: Cyanosis: No KEILLOR, Lylith ([id number]) [id number] Page 5 of 8 Ecchymosis: No Erythema: No Hemosiderin Staining: No Mottled: No Pallor: No Rubor: No Treatment Notes Electronic Signature(s) Signed: 05/17/2023 1:43:31 PM By: Geralyn Corwin DO Entered By: Geralyn Corwin on 05/17/2023  13:38:00 -------------------------------------------------------------------------------- Multi-Disciplinary Care Plan Details Patient Name: Date of Service: KHRISTIE, ARGOMANIZ 05/17/2023 1:15 PM Medical Record Number: [id number] Patient Account Number: [id number] Date of Birth/Sex: Treating RN: Jan 29, 1924 (89 y.o. Orville Govern Primary Care Hanad Leino: Alysia Penna Other Clinician: Referring Davarion Cuffee: Treating Merl Guardino/Extender: Kerby Nora in Treatment: 3 Active Inactive Wound/Skin Impairment Nursing Diagnoses: Impaired tissue integrity Knowledge deficit related to ulceration/compromised skin integrity Goals: Patient/caregiver will verbalize understanding of skin care regimen Date Initiated: 04/24/2023 Target Resolution Date: 07/04/2023 Goal Status: Active Ulcer/skin breakdown will have a volume reduction of 30% by week 4 Date Initiated: 04/24/2023 Date Inactivated: 05/17/2023 Target Resolution Date: 07/04/2023 Goal Status: Met Interventions: Assess patient/caregiver ability to obtain necessary supplies Assess patient/caregiver ability to perform ulcer/skin care regimen upon admission and as needed Assess ulceration(s) every visit Provide education on ulcer and skin care Notes: Electronic Signature(s) Signed: 05/17/2023 4:11:58 PM By: Redmond Pulling RN, BSN Entered By: Redmond Pulling on 05/17/2023 13:40:24 -------------------------------------------------------------------------------- Pain Assessment Details Patient Name: Date of Service: TAKYLA, BOIK 05/17/2023 1:15 PM Medical Record Number: [id number] Patient Account Number: [id number] Date of Birth/Sex: Treating RN: August 28, 1924 (89 y.o. Orville Govern Primary Care Robin Petrakis: Alysia Penna Other Clinician: Referring Julea Hutto: Treating Jun Osment/Extender: Pollyann Savoy [place], [place] ([id number]) [id number] Page 6 of 8 Weeks in Treatment: 3 Active  Problems Location of Pain Severity and Description of Pain Patient Has Paino No Site Locations Pain Management and Medication Current Pain Management: Electronic Signature(s) Signed: 05/17/2023 4:11:58 PM By: Redmond Pulling RN, BSN Entered By: Redmond Pulling on 05/17/2023 13:16:33 -------------------------------------------------------------------------------- Patient/Caregiver Education Details Patient Name: Date of Service: ARI, BEITER 6/13/2024andnbsp1:15 PM Medical Record Number: [id number] Patient Account Number: [id number] Date of Birth/Gender: Treating RN: August 01, 1924 (89 y.o. Orville Govern Primary Care Physician: Alysia Penna Other Clinician: Referring Physician: Treating Physician/Extender: Kerby Nora in Treatment: 3 Education Assessment Education Provided To: Patient Education Topics Provided Wound/Skin Impairment: Methods: Explain/Verbal Responses: State content correctly Electronic Signature(s) Signed: 05/17/2023 4:11:58 PM By: Redmond Pulling RN, BSN Entered By: Redmond Pulling on 05/17/2023 13:40:39 Putzier, Kathie Rhodes ([id number]) [id number] Page 7 of 8 -------------------------------------------------------------------------------- Wound Assessment Details Patient Name: Date of Service: MISTIE, ARSENAULT 05/17/2023 1:15 PM Medical Record Number: [id number] Patient Account Number: [id number] Date of Birth/Sex: Treating RN: 1924-11-06 (89 y.o. Orville Govern Primary Care Taryne Kiger: Alysia Penna Other Clinician: Referring Akina Maish: Treating Makyla Bye/Extender: Kerby Nora in Treatment: 3 Wound Status Wound Number: 6 Primary Trauma, Other Etiology: Wound Location: Left, Lateral Lower Leg Wound Status: Open Wounding Event:  Trauma Comorbid Cataracts, Hypertension, Peripheral Venous Disease, Date Acquired: 04/17/2023 History: Osteoarthritis Weeks Of Treatment: 3 Clustered  Wound: No Photos Wound Measurements Length: (cm) 0.5 Width: (cm) 0.7 Depth: (cm) 0.1 Area: (cm) 0.275 Volume: (cm) 0.027 % Reduction in Area: 97.3% % Reduction in Volume: 97.3% Epithelialization: Small (1-33%) Tunneling: No Undermining: No Wound Description Classification: Full Thickness Without Exposed Support Exudate Amount: Small Exudate Type: Serous Exudate Color: amber Structures Foul Odor After Cleansing: No Slough/Fibrino No Wound Bed Granulation Amount: Large (67-100%) Exposed Structure Granulation Quality: Red, Hyper-granulation Fat Layer (Subcutaneous Tissue) Exposed: Yes Necrotic Amount: None Present (0%) Periwound Skin Texture Texture Color No Abnormalities Noted: No No Abnormalities Noted: No Callus: No Atrophie Blanche: No Crepitus: No Cyanosis: No Excoriation: No Ecchymosis: No Induration: No Erythema: No Rash: No Hemosiderin Staining: No Scarring: No Mottled: No Pallor: No Moisture Rubor: No No Abnormalities Noted: No Dry / Scaly: No Maceration: No Treatment Notes Wound #6 (Lower Leg) Wound Laterality: Left, Lateral Kemnitz, Lyana ([id number]) [id number] Page 8 of 8 Cleanser Soap and Water Discharge Instruction: May shower and wash wound with dial antibacterial soap and water prior to dressing change. Peri-Wound Care Sween Lotion (Moisturizing lotion) Discharge Instruction: Apply moisturizing lotion as directed Topical Primary Dressing Xeroform Occlusive Gauze Dressing, 4x4 in Discharge Instruction: Apply to wound bed as instructed Secondary Dressing T Non-Adherent Dressing, 3x4 in elfa Discharge Instruction: Apply over primary dressing as directed. Secured With [employer] 4x5 (in/yd) Discharge Instruction: Secure with Coban as directed. Kerlix Roll Sterile, 4.5x3.1 (in/yd) Discharge Instruction: Secure with Kerlix as directed. Compression Wrap Compression Stockings Add-Ons Electronic  Signature(s) Signed: 05/17/2023 4:11:58 PM By: Redmond Pulling RN, BSN Entered By: Redmond Pulling on 05/17/2023 13:25:24 -------------------------------------------------------------------------------- Vitals Details Patient Name: Date of Service: EVADNA, EMANUEL 05/17/2023 1:15 PM Medical Record Number: [id number] Patient Account Number: [id number] Date of Birth/Sex: Treating RN: 09-03-1924 (89 y.o. Orville Govern Primary Care Remona Boom: Alysia Penna Other Clinician: Referring Mataio Mele: Treating Sherrilynn Gudgel/Extender: Kerby Nora in Treatment: 3 Vital Signs Time Taken: 15:16 Temperature (F): 98.4 Height (in): 61 Pulse (bpm): 54 Weight (lbs): 107 Respiratory Rate (breaths/min): 16 Body Mass Index (BMI): 20.2 Blood Pressure (mmHg): 146/73 Reference Range: 80 - 120 mg / dl Electronic Signature(s) Signed: 05/17/2023 4:11:58 PM By: Redmond Pulling RN, BSN Entered By: Redmond Pulling on 05/17/2023 13:16:26

## 2023-05-19 NOTE — Progress Notes (Signed)
[name], [name] ([id number]) [id number] Page 1 of 9 Visit Report for 05/17/2023 Chief Complaint Document Details Patient Name: Date of Service: Susan [name], Susan [name] 05/17/2023 1:15 PM Medical Record Number: [id number] Patient Account Number: [id number] Date of Birth/Sex: Treating RN: 03/18/1924 (89 y.o. F) Primary Care Provider: Alysia Penna Other Clinician: Referring Provider: Treating Provider/Extender: Kerby Nora in Treatment: 3 Information Obtained from: Patient Chief Complaint 07/07/19 - Patient is here for wound after injury to Right foot dorsum when she dropped an ironing board on it 10 days ago 04/24/2023; left lower extremity wound secondary to trauma Electronic Signature(s) Signed: 05/17/2023 1:43:31 PM By: Geralyn Corwin DO Entered By: Geralyn Corwin on 05/17/2023 13:38:08 -------------------------------------------------------------------------------- HPI Details Patient Name: Date of Service: [name], [name] 05/17/2023 1:15 PM Medical Record Number: [id number] Patient Account Number: [id number] Date of Birth/Sex: Treating RN: 1924-06-08 (89 y.o. F) Primary Care Provider: Alysia Penna Other Clinician: Referring Provider: Treating Provider/Extender: Kerby Nora in Treatment: 3 History of Present Illness HPI Description: ADMISSION 12/20/2018 This is a 89 year old woman who lives in the independent part of [facility]. She has had and had a long history of wounds on the right lower extremity times years which apparently have been on and off. She was hospitalized from 12/06/2018 through 12/08/2018 with right greater than left lower extremity cellulitis she received IV cefazolin. She was discharged on oral clindamycin which she is completed she left with an order for Santyl. She has been using this daily to the wounds. She admits to lower extremity edema for several years. Past  medical history of a CVA, hypertension. She is not a diabetic. ABI in our clinic was noncompressible 12/26/2018; this patient has wounds on the anterior right lower extremity in a worrisome area posteriorly. She was hospitalized early in January with cellulitis in the right leg as well. We ordered silver collagen last week however home health use silver alginate 01/03/2019 this patient has predominantly venous insufficiency wounds on the right posterior calf right anterior tibial area and right lateral calf. She has 1 area that is closed over. Currently 3 wounds. We have been using silver collagen with Kerlix Coban. She has noncompressible ABIs but her peripheral pulses are palpable 2/7; predominantly venous wounds on the right posterior calf and right anterior tibia and right lateral calf. The right lateral calf is closed. She has a new open area which is small just below the area on the anterior tibia. She states this was not there when home health change the dressing 2 days ago. Most problematic area has been on the posterior calf 2/14; predominantly venous wounds on the right posterior calf right anterior and lateral calf are all closed. She has very fragile skin with large areas of hemosiderin deposition possibly some degree of solar skin damage. We had ordered her juxta lite stockings last week we have made no progress with this 07/07/19-Patient is reestablishing with wound clinic today after suffering a foot injury on 06/26/19 when she dropped narrating board at the independent living center she resides in. Patient went to ER where she was noted to have a 5 cm skin tear on the dorsum of the right foot she was given Keflex, set up with visiting nurses, and initiated on silver collagen dressing to the right foot which she has not been able to use on account of severe pain. Patient has just been using dressing with gauze. She has completed the Keflex course over 7 days. Patient now presents with open  wound  on the right dorsum with a slight hematoma on the right great toe base 8/6-Patient was in for a nurse visit but converted to physician visit on account of increasing swelling, pain, redness in the foot. Patient was dressed with silver collagen by home health patient also felt the silver collagen was causing some irritation and redness by itself TINO, Danitza ([id number]) [id number] Page 2 of 9 8/10-Patient returns from last week after initiating Augmentin for the left forefoot swelling, inflammation, painful ulcer with eschar, the dressing was changed to Iodoflex since the silver collagen was burning, the foot appears about the same if not slightly better this week. 8/17; readmitted to the clinic 2 weeks ago. This wound is traumatic on the right anterior foot secondary to an ironing board dropping on the area. She had an x- ray that was negative. There may have been secondary infection in this area and Dr. Burman Riis added Augmentin. From an infection point of view the area seems better. We have been using silver collagen 8/24; changed her dressing to Iodoflex last week to help with debridement of the wound. She complains of a lot of pain in the tip of her toes. She did not want debridement 8/31; arrives with a much better looking wound today. Has been using Iodoflex I changed her to [facility]. 9/14; continued improvement today wound health surface looks healthy. I changed her to [facility] last time. Surface area is improved 9/21; overall wound area although she does have some epithelialization coming out but is an [place] in the middle of her wound. Still using Hydrofera Blue. Wound areas on the right dorsal foot 10/5; vast majority of this is epithelialized. Much improved from 2 weeks using Hydrofera Blue 10/19; patient's wound is closed on the dorsal right foot. This was initially trauma. This was initially caused by our ironing board  trauma. 04/24/2023 Susan [name] is a 89 year old female with a past medical history of venous insufficiency, essential hypertension, and macular degeneration that presents to the clinic for a 1 week history of wound to her left lower extremity. She states that she hit her leg against a car door. She has been using Vaseline to the area. She currently denies signs of infection. 5/30; patient presents for follow-up. We have been using antibiotic ointment with Xeroform under Kerlix/Coban to the left lower extremity. She tolerated the wrap well. She has no issues or complaints today. Wound is smaller. 6/7; left lateral leg traumatic wound in the setting of chronic venous insufficiency we have been using mupirocin and gentamicin polymen under kerlix Coban. She is tolerating the dressing well 6/13; patient presents for follow-up. We have been using antibiotic ointment with PolyMem silver under Kerlix/Coban. The wound is almost healed. Electronic Signature(s) Signed: 05/17/2023 1:43:31 PM By: Geralyn Corwin DO Entered By: Geralyn Corwin on 05/17/2023 13:38:40 -------------------------------------------------------------------------------- Physical Exam Details Patient Name: Date of Service: ROYALTI, DEWATERS 05/17/2023 1:15 PM Medical Record Number: [id number] Patient Account Number: [id number] Date of Birth/Sex: Treating RN: [birth year] (89 y.o. F) Primary Care Provider: Alysia Penna Other Clinician: Referring Provider: Treating Provider/Extender: Kerby Nora in Treatment: 3 Constitutional respirations regular, non-labored and within target range for patient.. Cardiovascular 2+ dorsalis pedis/posterior tibialis pulses. Psychiatric pleasant and cooperative. Notes T the left lower extremity there is an open wound with scant areas limited to skin breakdown. Most of this is epithelialized tissue. No surrounding signs of o infection. Good edema control. Venous  stasis dermatitis noted. Electronic Signature(s) Signed: 05/17/2023 1:43:31 PM By: Mikey Bussing,  Shanda Bumps DO Entered By: Geralyn Corwin on 05/17/2023 13:40:31 -------------------------------------------------------------------------------- Physician Orders Details Patient Name: Date of Service: ELDONNA, DEPAULA 05/17/2023 1:15 PM Medical Record Number: [id number] Patient Account Number: [id number] Date of Birth/Sex: Treating RN: 12/24/1923 (89 y.o. Or[place] Govern Primary Care Provider: Alysia Penna Other Clinician: CALLYN, KRICK ([id number]) [id number] Page 3 of 9 Referring Provider: Treating Provider/Extender: Kerby Nora in Treatment: 3 Verbal / Phone Orders: No Diagnosis Coding Follow-up Appointments ppointment in 1 week. - Dr Mikey Bussing Return A Anesthetic Wound #6 Left,Lateral Lower Leg (In clinic) Topical Lidocaine 5% applied to wound bed Bathing/ Shower/ Hygiene May shower with protection but do not get wound dressing(s) wet. Protect dressing(s) with water repellant cover (for example, large plastic bag) or a cast cover and may then take shower. Edema Control - Lymphedema / SCD / Other Bilateral Lower Extremities Elevate legs to the level of the heart or above for 30 minutes daily and/or when sitting for 3-4 times a day throughout the day. - As tolerated Avoid standing for long periods of time. Exercise regularly - Walking as tolerated Moisturize legs daily. Compression stocking or Garment 20-30 mm/Hg pressure to: - Recommendation for compression stockings Wound Treatment Wound #6 - Lower Leg Wound Laterality: Left, Lateral Cleanser: Soap and Water 1 x Per Week/30 Days Discharge Instructions: May shower and wash wound with dial antibacterial soap and water prior to dressing change. Peri-Wound Care: Sween Lotion (Moisturizing lotion) 1 x Per Week/30 Days Discharge Instructions: Apply moisturizing lotion as directed Prim  Dressing: Xeroform Occlusive Gauze Dressing, 4x4 in 1 x Per Week/30 Days ary Discharge Instructions: Apply to wound bed as instructed Secondary Dressing: T Non-Adherent Dressing, 3x4 in 1 x Per Week/30 Days elfa Discharge Instructions: Apply over primary dressing as directed. Secured With: Coban Self-Adherent Wrap 4x5 (in/yd) 1 x Per Week/30 Days Discharge Instructions: Secure with Coban as directed. Secured With: [employer], 4.5x3.1 (in/yd) 1 x Per Week/30 Days Discharge Instructions: Secure with Kerlix as directed. Electronic Signature(s) Signed: 05/17/2023 1:43:31 PM By: Geralyn Corwin DO Entered By: Geralyn Corwin on 05/17/2023 13:42:09 -------------------------------------------------------------------------------- Problem List Details Patient Name: Date of Service: ADDLEY, JAMA 05/17/2023 1:15 PM Medical Record Number: [id number] Patient Account Number: [id number] Date of Birth/Sex: Treating RN: 1924/09/01 (89 y.o. F) Primary Care Provider: Alysia Penna Other Clinician: Referring Provider: Treating Provider/Extender: Kerby Nora in Treatment: 3 Active Problems ICD-10 Encounter Code Description Active Date MDM Diagnosis [phone] Non-pressure chronic ulcer of other part of left lower leg with fat layer exposed5/21/2024 No Yes Courtney, Wava ([id number]) [phone].pdf Page 4 of 9 I87.312 Chronic venous hypertension (idiopathic) with ulcer of left lower extremity 04/24/2023 No Yes T79.8XXA Other early complications of trauma, initial encounter 04/24/2023 No Yes Inactive Problems Resolved Problems Electronic Signature(s) Signed: 05/17/2023 1:43:31 PM By: Geralyn Corwin DO Entered By: Geralyn Corwin on 05/17/2023 13:36:37 -------------------------------------------------------------------------------- Progress Note Details Patient Name: Date of Service: NAKAYAH, BONINI 05/17/2023 1:15 PM Medical Record Number:  [id number] Patient Account Number: [id number] Date of Birth/Sex: Treating RN: 1924/09/07 (89 y.o. F) Primary Care Provider: Alysia Penna Other Clinician: Referring Provider: Treating Provider/Extender: Kerby Nora in Treatment: 3 Subjective Chief Complaint Information obtained from Patient 07/07/19 - Patient is here for wound after injury to Right foot dorsum when she dropped an ironing board on it 10 days ago 04/24/2023; left lower extremity wound secondary to trauma History of Present Illness (HPI) ADMISSION 12/20/2018 This is a 89 year old woman who lives in the independent part of [facility]. She has had and had a long history of wounds on the right lower extremity times years which apparently have been on and off. She was hospitalized from 12/06/2018 through 12/08/2018 with right greater than left lower extremity cellulitis she received IV cefazolin. She was discharged on oral clindamycin which she is completed she left with an order for Santyl. She has been using this daily to the wounds. She admits to lower extremity edema for several years. Past medical history of a CVA, hypertension. She is not a diabetic. ABI in our clinic was noncompressible 12/26/2018; this patient has wounds on the anterior right lower extremity in a worrisome area posteriorly. She was hospitalized early in January with cellulitis in the right leg as well. We ordered silver collagen last week however home health use silver alginate 01/03/2019 this patient has predominantly venous insufficiency wounds on the right posterior calf right anterior tibial area and right lateral calf. She has 1 area that is closed over. Currently 3 wounds. We have been using silver collagen with Kerlix Coban. She has noncompressible ABIs but her peripheral pulses are palpable 2/7; predominantly venous wounds on the right posterior calf and right anterior tibia and right lateral calf. The  right lateral calf is closed. She has a new open area which is small just below the area on the anterior tibia. She states this was not there when home health change the dressing 2 days ago. Most problematic area has been on the posterior calf 2/14; predominantly venous wounds on the right posterior calf right anterior and lateral calf are all closed. She has very fragile skin with large areas of hemosiderin deposition possibly some degree of solar skin damage. We had ordered her juxta lite stockings last week we have made no progress with this 07/07/19-Patient is reestablishing with wound clinic today after suffering a foot injury on 06/26/19 when she dropped narrating board at the independent living center she resides in. Patient went to ER where she was noted to have a 5 cm skin tear on the dorsum of the right foot she was given Keflex, set up with visiting nurses, and initiated on silver collagen dressing to the right foot which she has not been able to use on account of severe pain. Patient has just been using dressing with gauze. She has completed the Keflex course over 7 days. Patient now presents with open wound on the right dorsum with a slight hematoma on the right great toe base 8/6-Patient was in for a nurse visit but converted to physician visit on account of increasing swelling, pain, redness in the foot. Patient was dressed with silver collagen by home health patient also felt the silver collagen was causing some irritation and redness by itself 8/10-Patient returns from last week after initiating Augmentin for the left forefoot swelling, inflammation, painful ulcer with eschar, the dressing was changed to Iodoflex since the silver collagen was burning, the foot appears about the same if not slightly better this week. 8/17; readmitted to the clinic 2 weeks ago. This wound is traumatic on the right anterior foot secondary to an ironing board dropping on the area. She had an x- ray that was  negative. There may have been secondary infection in this area and Dr. Burman Riis added Augmentin. From an infection point of view the area seems better. We have been using silver collagen 8/24; changed her dressing to Iodoflex last week to help with debridement of the wound. She complains of a lot of pain in  the tip of her toes. She did not want debridement SALEENA, LIEGEL ([id number]) [id number] Page 5 of 9 8/31; arrives with a much better looking wound today. Has been using Iodoflex I changed her to [facility]. 9/14; continued improvement today wound health surface looks healthy. I changed her to [facility] last time. Surface area is improved 9/21; overall wound area although she does have some epithelialization coming out but is an [place] in the middle of her wound. Still using Hydrofera Blue. Wound areas on the right dorsal foot 10/5; vast majority of this is epithelialized. Much improved from 2 weeks using Hydrofera Blue 10/19; patient's wound is closed on the dorsal right foot. This was initially trauma. This was initially caused by our ironing board trauma. 04/24/2023 Ms. Jovanda Crill is a 89 year old female with a past medical history of venous insufficiency, essential hypertension, and macular degeneration that presents to the clinic for a 1 week history of wound to her left lower extremity. She states that she hit her leg against a car door. She has been using Vaseline to the area. She currently denies signs of infection. 5/30; patient presents for follow-up. We have been using antibiotic ointment with Xeroform under Kerlix/Coban to the left lower extremity. She tolerated the wrap well. She has no issues or complaints today. Wound is smaller. 6/7; left lateral leg traumatic wound in the setting of chronic venous insufficiency we have been using mupirocin and gentamicin polymen under kerlix Coban. She is tolerating the dressing well 6/13; patient presents  for follow-up. We have been using antibiotic ointment with PolyMem silver under Kerlix/Coban. The wound is almost healed. Patient History Information obtained from Patient. Family History Heart Disease - Mother,Father,Maternal Grandparents,Paternal Grandparents, Hypertension - Mother, Stroke - Father,Mother,Maternal Grandparents, No family history of Cancer, Diabetes, Hereditary Spherocytosis, Kidney Disease, Lung Disease, Seizures, Thyroid Problems, Tuberculosis. Social History Never smoker, Marital Status - Widowed, Alcohol Use - Moderate, Drug Use - No History, Caffeine Use - Never. Medical History Eyes Patient has history of Cataracts - removed Denies history of Glaucoma, Optic Neuritis Ear/Nose/Mouth/Throat Denies history of Chronic sinus problems/congestion, Middle ear problems Hematologic/Lymphatic Denies history of Anemia, Hemophilia, Human Immunodeficiency Virus, Lymphedema, Sickle Cell Disease Respiratory Denies history of Aspiration, Asthma, Chronic Obstructive Pulmonary Disease (COPD), Pneumothorax, Sleep Apnea Cardiovascular Patient has history of Hypertension, Peripheral Venous Disease Denies history of Angina, Arrhythmia, Congestive Heart Failure, Coronary Artery Disease, Deep Vein Thrombosis, Hypotension, Myocardial Infarction, Peripheral Arterial Disease, Phlebitis, Vasculitis Gastrointestinal Denies history of Cirrhosis , Colitis, Crohns, Hepatitis A, Hepatitis B, Hepatitis C Integumentary (Skin) Denies history of History of Burn Musculoskeletal Patient has history of Osteoarthritis Hospitalization/Surgery History - cellulitis. - appendectomy. - cholecystectomy. - tonsillectomy. - CVA 2015. Medical A Surgical History Notes nd Constitutional Symptoms (General Health) stroke 2015 Eyes macular degeneration Gastrointestinal diverticulosis GERD stomach ulcers IBS Genitourinary Kidney stones Immunological Fibromyalgia Integumentary (Skin) skin  Ca Psychiatric anxiety Objective Constitutional respirations regular, non-labored and within target range for patient.. Vitals Time Taken: 3:16 PM, Height: 61 in, Weight: 107 lbs, BMI: 20.2, Temperature: 98.4 F, Pulse: 54 bpm, Respiratory Rate: 16 breaths/min, Blood Pressure: 146/73 mmHg. Cardiovascular 2+ dorsalis pedis/posterior tibialis pulses. JULIO, KAATZ ([id number]) [id number] Page 6 of 9 Psychiatric pleasant and cooperative. General Notes: T the left lower extremity there is an open wound with scant areas limited to skin breakdown. Most of this is epithelialized tissue. No o surrounding signs of infection. Good edema control. Venous stasis dermatitis noted. Integumentary (Hair, Skin) Wound #6 status is Open. Original cause of  wound was Trauma. The date acquired was: 04/17/2023. The wound has been in treatment 3 weeks. The wound is located on the Left,Lateral Lower Leg. The wound measures 0.5cm length x 0.7cm width x 0.1cm depth; 0.275cm^2 area and 0.027cm^3 volume. There is Fat Layer (Subcutaneous Tissue) exposed. There is no tunneling or undermining noted. There is a small amount of serous drainage noted. There is large (67-100%) red, hyper - granulation within the wound bed. There is no necrotic tissue within the wound bed. The periwound skin appearance did not exhibit: Callus, Crepitus, Excoriation, Induration, Rash, Scarring, Dry/Scaly, Maceration, Atrophie Blanche, Cyanosis, Ecchymosis, Hemosiderin Staining, Mottled, Pallor, Rubor, Erythema. Assessment Active Problems ICD-10 Non-pressure chronic ulcer of other part of left lower leg with fat layer exposed Chronic venous hypertension (idiopathic) with ulcer of left lower extremity Other early complications of trauma, initial encounter Patient's wound has shown improvement in size) since last clinic visit. I recommended changing the dressing to Xeroform as the wound is almost healed. Continue  Kerlix/Coban. Follow-up in 1 week. Plan Follow-up Appointments: Return Appointment in 1 week. - Dr Mikey Bussing Anesthetic: Wound #6 Left,Lateral Lower Leg: (In clinic) Topical Lidocaine 5% applied to wound bed Bathing/ Shower/ Hygiene: May shower with protection but do not get wound dressing(s) wet. Protect dressing(s) with water repellant cover (for example, large plastic bag) or a cast cover and may then take shower. Edema Control - Lymphedema / SCD / Other: Elevate legs to the level of the heart or above for 30 minutes daily and/or when sitting for 3-4 times a day throughout the day. - As tolerated Avoid standing for long periods of time. Exercise regularly - Walking as tolerated Moisturize legs daily. Compression stocking or Garment 20-30 mm/Hg pressure to: - Recommendation for compression stockings WOUND #6: - Lower Leg Wound Laterality: Left, Lateral Cleanser: Soap and Water 1 x Per Week/30 Days Discharge Instructions: May shower and wash wound with dial antibacterial soap and water prior to dressing change. Peri-Wound Care: Sween Lotion (Moisturizing lotion) 1 x Per Week/30 Days Discharge Instructions: Apply moisturizing lotion as directed Prim Dressing: Xeroform Occlusive Gauze Dressing, 4x4 in 1 x Per Week/30 Days ary Discharge Instructions: Apply to wound bed as instructed Secondary Dressing: T Non-Adherent Dressing, 3x4 in 1 x Per Week/30 Days elfa Discharge Instructions: Apply over primary dressing as directed. Secured With: Coban Self-Adherent Wrap 4x5 (in/yd) 1 x Per Week/30 Days Discharge Instructions: Secure with Coban as directed. Secured With: [employer], 4.5x3.1 (in/yd) 1 x Per Week/30 Days Discharge Instructions: Secure with Kerlix as directed. 1. Xeroform under Kerlix/Cobanleft lower extremity 2. Follow-up in 1 week Electronic Signature(s) Signed: 05/17/2023 1:43:31 PM By: Geralyn Corwin DO Entered By: Geralyn Corwin on 05/17/2023 13:43:01 Therrell,  Kathie Rhodes ([id number]) [id number] Page 7 of 9 -------------------------------------------------------------------------------- HxROS Details Patient Name: Date of Service: AIYA, AZOULAY 05/17/2023 1:15 PM Medical Record Number: [id number] Patient Account Number: [id number] Date of Birth/Sex: Treating RN: [birth year] (89 y.o. F) Primary Care Provider: Alysia Penna Other Clinician: Referring Provider: Treating Provider/Extender: Kerby Nora in Treatment: 3 Information Obtained From Patient Constitutional Symptoms (General Health) Medical History: Past Medical History Notes: stroke 2015 Eyes Medical History: Positive for: Cataracts - removed Negative for: Glaucoma; Optic Neuritis Past Medical History Notes: macular degeneration Ear/Nose/Mouth/Throat Medical History: Negative for: Chronic sinus problems/congestion; Middle ear problems Hematologic/Lymphatic Medical History: Negative for: Anemia; Hemophilia; Human Immunodeficiency Virus; Lymphedema; Sickle Cell Disease Respiratory Medical History: Negative for: Aspiration; Asthma; Chronic Obstructive Pulmonary Disease (COPD); Pneumothorax; Sleep Apnea Cardiovascular Medical History: Positive  for: Hypertension; Peripheral Venous Disease Negative for: Angina; Arrhythmia; Congestive Heart Failure; Coronary Artery Disease; Deep Vein Thrombosis; Hypotension; Myocardial Infarction; Peripheral Arterial Disease; Phlebitis; Vasculitis Gastrointestinal Medical History: Negative for: Cirrhosis ; Colitis; Crohns; Hepatitis A; Hepatitis B; Hepatitis C Past Medical History Notes: diverticulosis GERD stomach ulcers IBS Genitourinary Medical History: Past Medical History Notes: Kidney stones Immunological Medical History: Past Medical History Notes: Fibromyalgia Integumentary (Skin) Medical History: Negative for: History of Burn Past Medical History Notes: skin  Ca Musculoskeletal Medical HistorySHALLA, GREEK ([id number]) [id number] Page 8 of 9 Positive for: Osteoarthritis Psychiatric Medical History: Past Medical History Notes: anxiety HBO Extended History Items Eyes: Cataracts Immunizations Pneumococcal Vaccine: Received Pneumococcal Vaccination: Yes Received Pneumococcal Vaccination On or After 60th Birthday: Yes Implantable Devices None Hospitalization / Surgery History Type of Hospitalization/Surgery cellulitis appendectomy cholecystectomy tonsillectomy CVA 2015 Family and Social History Cancer: No; Diabetes: No; Heart Disease: Yes - Mother,Father,Maternal Grandparents,Paternal Grandparents; Hereditary Spherocytosis: No; Hypertension: Yes - Mother; Kidney Disease: No; Lung Disease: No; Seizures: No; Stroke: Yes - Father,Mother,Maternal Grandparents; Thyroid Problems: No; Tuberculosis: No; Never smoker; Marital Status - Widowed; Alcohol Use: Moderate; Drug Use: No History; Caffeine Use: Never; Financial Concerns: No; Food, Clothing or Shelter Needs: No; Support System Lacking: No; Transportation Concerns: No Electronic Signature(s) Signed: 05/17/2023 1:43:31 PM By: Geralyn Corwin DO Entered By: Geralyn Corwin on 05/17/2023 13:38:47 -------------------------------------------------------------------------------- SuperBill Details Patient Name: Date of Service: PAMELYN, GITTLEMAN 05/17/2023 Medical Record Number: [id number] Patient Account Number: [id number] Date of Birth/Sex: Treating RN: [birth year] (89 y.o. Orville Govern Primary Care Provider: Alysia Penna Other Clinician: Referring Provider: Treating Provider/Extender: Kerby Nora in Treatment: 3 Diagnosis Coding ICD-10 Codes Code Description [phone] Non-pressure chronic ulcer of other part of left lower leg with fat layer exposed I87.312 Chronic venous hypertension (idiopathic) with ulcer of left lower  extremity T79.8XXA Other early complications of trauma, initial encounter Facility Procedures : CPT4 Code: 10272536 Description: 99213 - WOUND CARE VISIT-LEV 3 EST PT Modifier: Quantity: 1 Physician Procedures : CPT4 Code Description Modifier [id number] 99213 - WC PHYS LEVEL 3 - EST PT JASIAH, HOUY ([id number]) [id number] ICD-10 Diagnosis Description L97.822 Non-pressure chronic ulcer of other part of left lower leg with fat layer exposed  I87.312 Chronic venous hypertension (idiopathic) with ulcer of left lower extremity T79.8XXA Other early complications of trauma, initial encounter Quantity: 1 227.pdf Page 9 of 9 Electronic Signature(s) Signed: 05/17/2023 1:43:31 PM By: Geralyn Corwin DO Entered By: Geralyn Corwin on 05/17/2023 13:43:12

## 2023-05-22 ENCOUNTER — Other Ambulatory Visit: Payer: Self-pay | Admitting: Nurse Practitioner

## 2023-05-22 DIAGNOSIS — I10 Essential (primary) hypertension: Secondary | ICD-10-CM

## 2023-05-22 DIAGNOSIS — I872 Venous insufficiency (chronic) (peripheral): Secondary | ICD-10-CM

## 2023-05-24 ENCOUNTER — Encounter (HOSPITAL_BASED_OUTPATIENT_CLINIC_OR_DEPARTMENT_OTHER): Payer: Medicare Other | Admitting: Internal Medicine

## 2023-05-24 DIAGNOSIS — I872 Venous insufficiency (chronic) (peripheral): Secondary | ICD-10-CM | POA: Diagnosis not present

## 2023-05-24 DIAGNOSIS — L97822 Non-pressure chronic ulcer of other part of left lower leg with fat layer exposed: Secondary | ICD-10-CM

## 2023-05-24 DIAGNOSIS — I87312 Chronic venous hypertension (idiopathic) with ulcer of left lower extremity: Secondary | ICD-10-CM | POA: Diagnosis not present

## 2023-05-24 DIAGNOSIS — Z8673 Personal history of transient ischemic attack (TIA), and cerebral infarction without residual deficits: Secondary | ICD-10-CM | POA: Diagnosis not present

## 2023-05-24 DIAGNOSIS — T798XXA Other early complications of trauma, initial encounter: Secondary | ICD-10-CM | POA: Diagnosis not present

## 2023-05-24 DIAGNOSIS — I10 Essential (primary) hypertension: Secondary | ICD-10-CM | POA: Diagnosis not present

## 2023-05-24 DIAGNOSIS — M797 Fibromyalgia: Secondary | ICD-10-CM | POA: Diagnosis not present

## 2023-05-25 ENCOUNTER — Ambulatory Visit (INDEPENDENT_AMBULATORY_CARE_PROVIDER_SITE_OTHER): Payer: Medicare Other

## 2023-05-25 VITALS — Ht 60.0 in | Wt 107.0 lb

## 2023-05-25 DIAGNOSIS — Z0000 Encounter for general adult medical examination without abnormal findings: Secondary | ICD-10-CM | POA: Diagnosis not present

## 2023-05-25 NOTE — Progress Notes (Signed)
AKI, BURDIN ([id number]) [id number] Page 1 of 8 Visit Report for 05/24/2023 Arrival Information Details Patient Name: Date of Service: Susan [name], Susan [name] 05/24/2023 1:15 PM Medical Record Number: [id number] Patient Account Number: [id number] Date of Birth/Sex: Treating RN: [birth year] (89 y.o. Katrinka Blazing Primary Care Mckenze Slone: Alysia Penna Other Clinician: Referring Elissa Grieshop: Treating Maycie Luera/Extender: Kerby Nora in Treatment: 4 Visit Information History Since Last Visit Added or deleted any medications: No Patient Arrived: Dan Humphreys Any new allergies or adverse reactions: No Arrival Time: 13:21 Had a fall or experienced change in No Accompanied By: self activities of daily living that may affect Transfer Assistance: None risk of falls: Patient Identification Verified: Yes Signs or symptoms of abuse/neglect since last visito No Patient Requires Transmission-Based Precautions: No Hospitalized since last visit: No Patient Has Alerts: No Implantable device outside of the clinic excluding No cellular tissue based products placed in the center since last visit: Has Dressing in Place as Prescribed: Yes Pain Present Now: No Electronic Signature(s) Signed: 05/24/2023 5:53:11 PM By: Karie Schwalbe RN Entered By: Karie Schwalbe on 05/24/2023 13:22:12 -------------------------------------------------------------------------------- Clinic Level of Care Assessment Details Patient Name: Date of Service: Susan [name], Susan [name] 05/24/2023 1:15 PM Medical Record Number: [id number] Patient Account Number: [id number] Date of Birth/Sex: Treating RN: October 07, 1924 (89 y.o. Katrinka Blazing Primary Care Cormick Moss: Alysia Penna Other Clinician: Referring Helen Winterhalter: Treating Melissia Lahman/Extender: Kerby Nora in Treatment: 4 Clinic Level of Care Assessment Items TOOL 4 Quantity Score X- 1 0 Use when only an EandM is  performed on FOLLOW-UP visit ASSESSMENTS - Nursing Assessment / Reassessment X- 1 10 Reassessment of Co-morbidities (includes updates in patient status) X- 1 5 Reassessment of Adherence to Treatment Plan ASSESSMENTS - Wound and Skin A ssessment / Reassessment X - Simple Wound Assessment / Reassessment - one wound 1 5 []  - 0 Complex Wound Assessment / Reassessment - multiple wounds []  - 0 Dermatologic / Skin Assessment (not related to wound area) ASSESSMENTS - Focused Assessment []  - 0 Circumferential Edema Measurements - multi extremities []  - 0 Nutritional Assessment / Counseling / Intervention [place], Kathie Rhodes ([id number]) [id number] Page 2 of 8 []  - 0 Lower Extremity Assessment (monofilament, tuning fork, pulses) []  - 0 Peripheral Arterial Disease Assessment (using hand held doppler) ASSESSMENTS - Ostomy and/or Continence Assessment and Care []  - 0 Incontinence Assessment and Management []  - 0 Ostomy Care Assessment and Management (repouching, etc.) PROCESS - Coordination of Care X - Simple Patient / Family Education for ongoing care 1 15 []  - 0 Complex (extensive) Patient / Family Education for ongoing care X- 1 10 Staff obtains Chiropractor, Records, T Results / Process Orders est X- 1 10 Staff telephones HHA, Nursing Homes / Clarify orders / etc []  - 0 Routine Transfer to another Facility (non-emergent condition) []  - 0 Routine Hospital Admission (non-emergent condition) []  - 0 New Admissions / Manufacturing engineer / Ordering NPWT Apligraf, etc. , []  - 0 Emergency Hospital Admission (emergent condition) X- 1 10 Simple Discharge Coordination []  - 0 Complex (extensive) Discharge Coordination PROCESS - Special Needs []  - 0 Pediatric / Minor Patient Management []  - 0 Isolation Patient Management []  - 0 Hearing / Language / Visual special needs []  - 0 Assessment of Community assistance (transportation, D/C planning, etc.) []  -  0 Additional assistance / Altered mentation []  - 0 Support Surface(s) Assessment (bed, cushion, seat, etc.) INTERVENTIONS - Wound Cleansing / Measurement X - Simple Wound Cleansing - one wound 1 5 []  - 0 Complex Wound Cleansing -  multiple wounds X- 1 5 Wound Imaging (photographs - any number of wounds) []  - 0 Wound Tracing (instead of photographs) X- 1 5 Simple Wound Measurement - one wound []  - 0 Complex Wound Measurement - multiple wounds INTERVENTIONS - Wound Dressings []  - 0 Small Wound Dressing one or multiple wounds []  - 0 Medium Wound Dressing one or multiple wounds []  - 0 Large Wound Dressing one or multiple wounds []  - 0 Application of Medications - topical []  - 0 Application of Medications - injection INTERVENTIONS - Miscellaneous []  - 0 External ear exam []  - 0 Specimen Collection (cultures, biopsies, blood, body fluids, etc.) []  - 0 Specimen(s) / Culture(s) sent or taken to Lab for analysis []  - 0 Patient Transfer (multiple staff / Nurse, adult / Similar devices) []  - 0 Simple Staple / Suture removal (25 or less) []  - 0 Complex Staple / Suture removal (26 or more) []  - 0 Hypo / Hyperglycemic Management (close monitor of Blood Glucose) Kaczmarek, Jynesis ([id number]) [id number] Page 3 of 8 []  - 0 Ankle / Brachial Index (ABI) - do not check if billed separately X- 1 5 Vital Signs Has the patient been seen at the hospital within the last three years: Yes Total Score: 85 Level Of Care: New/Established - Level 3 Electronic Signature(s) Signed: 05/24/2023 5:53:11 PM By: Karie Schwalbe RN Entered By: Karie Schwalbe on 05/24/2023 14:08:41 -------------------------------------------------------------------------------- Encounter Discharge Information Details Patient Name: Date of Service: Susan [name], Susan [name] 05/24/2023 1:15 PM Medical Record Number: [id number] Patient Account Number: [id number] Date of Birth/Sex: Treating RN: 18-May-1924 (89  y.o. Katrinka Blazing Primary Care Tyronne Blann: Alysia Penna Other Clinician: Referring Aubriauna Riner: Treating Wreatha Sturgeon/Extender: Kerby Nora in Treatment: 4 Encounter Discharge Information Items Discharge Condition: Stable Ambulatory Status: Walker Discharge Destination: Home Transportation: Private Auto Accompanied By: self Schedule Follow-up Appointment: Yes Clinical Summary of Care: Patient Declined Electronic Signature(s) Signed: 05/24/2023 5:53:11 PM By: Karie Schwalbe RN Entered By: Karie Schwalbe on 05/24/2023 14:09:45 -------------------------------------------------------------------------------- Lower Extremity Assessment Details Patient Name: Date of Service: Susan [name], Susan [name] 05/24/2023 1:15 PM Medical Record Number: [id number] Patient Account Number: [id number] Date of Birth/Sex: Treating RN: 1924/03/18 (89 y.o. Katrinka Blazing Primary Care Yazmeen Woolf: Alysia Penna Other Clinician: Referring Emberlynn Riggan: Treating Osa Campoli/Extender: Kerby Nora in Treatment: 4 Edema Assessment Assessed: [Left: No] [Right: No] [Left: Edema] [Right: :] Calf Left: Right: Point of Measurement: 28 cm From Medial Instep 24.2 cm Ankle Left: Right: Point of Measurement: 10 cm From Medial Instep 17 cm Vascular Assessment [place], Geral ([id number]) [Right:[id number] Page 4 of 8] Pulses: Dorsalis Pedis Palpable: [Left:Yes] Electronic Signature(s) Signed: 05/24/2023 5:53:11 PM By: Karie Schwalbe RN Entered By: Karie Schwalbe on 05/24/2023 13:22:46 -------------------------------------------------------------------------------- Multi Wound Chart Details Patient Name: Date of Service: Susan [name], Susan [name] 05/24/2023 1:15 PM Medical Record Number: [id number] Patient Account Number: [id number] Date of Birth/Sex: Treating RN: 09-Sep-1924 (89 y.o. F) Primary Care Erminie Foulks: Alysia Penna Other Clinician: Referring  Zerrick Hanssen: Treating Serenity Fortner/Extender: Kerby Nora in Treatment: 4 Vital Signs Height(in): 61 Pulse(bpm): 59 Weight(lbs): 107 Blood Pressure(mmHg): 157/76 Body Mass Index(BMI): 20.2 Temperature(F): 98.3 Respiratory Rate(breaths/min): 16 [6:Photos:] [N/A:N/A] Left, Lateral Lower Leg N/A N/A Wound Location: Trauma N/A N/A Wounding Event: Trauma, Other N/A N/A Primary Etiology: Cataracts, Hypertension, Peripheral N/A N/A Comorbid History: Venous Disease, Osteoarthritis 04/17/2023 N/A N/A Date Acquired: 4 N/A N/A Weeks of Treatment: Healed - Epithelialized N/A N/A Wound Status: No N/A N/A Wound Recurrence: 0x0x0 N/A N/A Measurements L x W x D (cm) 0 N/A N/A A (  cm) : rea 0 N/A N/A Volume (cm) : 100.00% N/A N/A % Reduction in Area: 100.00% N/A N/A % Reduction in Volume: Full Thickness Without Exposed N/A N/A Classification: Support Structures None Present N/A N/A Exudate Amount: None Present (0%) N/A N/A Granulation Amount: None Present (0%) N/A N/A Necrotic Amount: Fascia: No N/A N/A Exposed Structures: Fat Layer (Subcutaneous Tissue): No Tendon: No Muscle: No Joint: No Bone: No Large (67-100%) N/A N/A Epithelialization: Excoriation: No N/A N/A Periwound Skin Texture: Induration: No Callus: No Crepitus: No Rash: No Scarring: No Maceration: No N/A N/A Periwound Skin Moisture: Dry/Scaly: No Atrophie Blanche: No N/A N/A Periwound Skin ColorAmadi Yoshino, Kathie Rhodes ([id number]) [id number] Page 5 of 8 Cyanosis: No Ecchymosis: No Erythema: No Hemosiderin Staining: No Mottled: No Pallor: No Rubor: No No Abnormality N/A N/A Temperature: Treatment Notes Electronic Signature(s) Signed: 05/24/2023 4:07:01 PM By: Geralyn Corwin DO Entered By: Geralyn Corwin on 05/24/2023 13:46:14 -------------------------------------------------------------------------------- Multi-Disciplinary Care Plan  Details Patient Name: Date of Service: Susan [name], Susan [name] 05/24/2023 1:15 PM Medical Record Number: [id number] Patient Account Number: [id number] Date of Birth/Sex: Treating RN: 07/09/1924 (89 y.o. Katrinka Blazing Primary Care Lilyanah Celestin: Alysia Penna Other Clinician: Referring Keath Matera: Treating Xzavion Doswell/Extender: Kerby Nora in Treatment: 4 Active Inactive Electronic Signature(s) Signed: 05/24/2023 5:53:11 PM By: Karie Schwalbe RN Entered By: Karie Schwalbe on 05/24/2023 14:05:22 -------------------------------------------------------------------------------- Pain Assessment Details Patient Name: Date of Service: Susan [name], Susan [name] 05/24/2023 1:15 PM Medical Record Number: [id number] Patient Account Number: [id number] Date of Birth/Sex: Treating RN: [birth year] (89 y.o. Katrinka Blazing Primary Care Eiliana Drone: Alysia Penna Other Clinician: Referring Suzzette Gasparro: Treating Tyronza Happe/Extender: Kerby Nora in Treatment: 4 Active Problems Location of Pain Severity and Description of Pain Patient Has Paino No Site Locations [place], [place] ([id number]) [id number] Page 6 of 8 Pain Management and Medication Current Pain Management: Electronic Signature(s) Signed: 05/24/2023 5:53:11 PM By: Karie Schwalbe RN Entered By: Karie Schwalbe on 05/24/2023 13:22:32 -------------------------------------------------------------------------------- Patient/Caregiver Education Details Patient Name: Date of Service: Susan [name], Susan [name] 6/20/2024andnbsp1:15 PM Medical Record Number: [id number] Patient Account Number: [id number] Date of Birth/Gender: Treating RN: 06-08-1924 (89 y.o. Katrinka Blazing Primary Care Physician: Alysia Penna Other Clinician: Referring Physician: Treating Physician/Extender: Kerby Nora in Treatment: 4 Education Assessment Education Provided To: Patient Education  Topics Provided Wound/Skin Impairment: Methods: Explain/Verbal Responses: Return demonstration correctly Electronic Signature(s) Signed: 05/24/2023 5:53:11 PM By: Karie Schwalbe RN Entered By: Karie Schwalbe on 05/24/2023 14:06:00 -------------------------------------------------------------------------------- Wound Assessment Details Patient Name: Date of Service: Susan [name], Susan [name] 05/24/2023 1:15 PM Medical Record Number: [id number] Patient Account Number: [id number] Date of Birth/Sex: Treating RN: Apr 14, 1924 (89 y.o. Katrinka Blazing Primary Care Rebeka Kimble: Alysia Penna Other Clinician: Referring Anikin Prosser: Treating Haliegh Khurana/Extender: Pollyann Savoy [place], [place] ([id number]) [id number] Page 7 of 8 Weeks in Treatment: 4 Wound Status Wound Number: 6 Primary Trauma, Other Etiology: Wound Location: Left, Lateral Lower Leg Wound Status: Healed - Epithelialized Wounding Event: Trauma Comorbid Cataracts, Hypertension, Peripheral Venous Disease, Date Acquired: 04/17/2023 History: Osteoarthritis Weeks Of Treatment: 4 Clustered Wound: No Photos Wound Measurements Length: (cm) Width: (cm) Depth: (cm) Area: (cm) Volume: (cm) 0 % Reduction in Area: 100% 0 % Reduction in Volume: 100% 0 Epithelialization: Large (67-100%) 0 Tunneling: No 0 Undermining: No Wound Description Classification: Full Thickness Without Exposed Support Structures Exudate Amount: None Present Foul Odor After Cleansing: No Slough/Fibrino No Wound Bed Granulation Amount: None Present (0%) Exposed Structure Necrotic Amount: None Present (0%) Fascia Exposed: No Fat Layer (Subcutaneous Tissue) Exposed: No Tendon Exposed: No Muscle Exposed:  No Joint Exposed: No Bone Exposed: No Periwound Skin Texture Texture Color No Abnormalities Noted: Yes No Abnormalities Noted: Yes Moisture Temperature / Pain No Abnormalities Noted: Yes Temperature: No  Abnormality Electronic Signature(s) Signed: 05/24/2023 5:53:11 PM By: Karie Schwalbe RN Entered By: Karie Schwalbe on 05/24/2023 13:39:05 -------------------------------------------------------------------------------- Vitals Details Patient Name: Date of Service: Susan [name], Susan [name] 05/24/2023 1:15 PM Medical Record Number: [id number] Patient Account Number: [id number] Date of Birth/Sex: Treating RN: 06-26-1924 (89 y.o. Katrinka Blazing Primary Care Lory Galan: Alysia Penna Other Clinician: Referring Angelice Piech: Treating Jalicia Roszak/Extender: Kerby Nora in Treatment: 4 [place], [place] ([id number]) [id number] Page 8 of 8 Vital Signs Time Taken: 13:19 Temperature (F): 98.3 Height (in): 61 Pulse (bpm): 59 Weight (lbs): 107 Respiratory Rate (breaths/min): 16 Body Mass Index (BMI): 20.2 Blood Pressure (mmHg): 157/76 Reference Range: 80 - 120 mg / dl Electronic Signature(s) Signed: 05/24/2023 5:53:11 PM By: Karie Schwalbe RN Entered By: Karie Schwalbe on 05/24/2023 13:22:23

## 2023-05-25 NOTE — Progress Notes (Signed)
DRUSCILLA, PETSCH ([id number]) [id number] Page 1 of 8 Visit Report for 05/24/2023 Chief Complaint Document Details Patient Name: Date of Service: Susan [name], Susan [name] 05/24/2023 1:15 PM Medical Record Number: [id number] Patient Account Number: [id number] Date of Birth/Sex: Treating RN: August 13, 1924 (89 y.o. F) Primary Care Provider: Alysia Penna Other Clinician: Referring Provider: Treating Provider/Extender: Kerby Nora in Treatment: 4 Information Obtained from: Patient Chief Complaint 07/07/19 - Patient is here for wound after injury to Right foot dorsum when she dropped an ironing board on it 10 days ago 04/24/2023; left lower extremity wound secondary to trauma Electronic Signature(s) Signed: 05/24/2023 4:07:01 PM By: Geralyn Corwin DO Entered By: Geralyn Corwin on 05/24/2023 13:46:26 -------------------------------------------------------------------------------- HPI Details Patient Name: Date of Service: Susan [name], Susan [name] 05/24/2023 1:15 PM Medical Record Number: [id number] Patient Account Number: [id number] Date of Birth/Sex: Treating RN: 11/21/1924 (89 y.o. F) Primary Care Provider: Alysia Penna Other Clinician: Referring Provider: Treating Provider/Extender: Kerby Nora in Treatment: 4 History of Present Illness HPI Description: ADMISSION 12/20/2018 This is a 89 year old woman who lives in the independent [facility] of [facility]. She has had and had a long history of wounds on the right lower extremity times years which apparently have been on and off. She was hospitalized from 12/06/2018 through 12/08/2018 with right greater than left lower extremity cellulitis she received IV cefazolin. She was discharged on oral clindamycin which she is completed she left with an order for Santyl. She has been using this daily to the wounds. She admits to lower extremity edema for several years. Past  medical history of a CVA, hypertension. She is not a diabetic. ABI in our clinic was noncompressible 12/26/2018; this patient has wounds on the anterior right lower extremity in a worrisome area posteriorly. She was hospitalized early in January with cellulitis in the right leg as well. We ordered silver collagen last week however home health use silver alginate 01/03/2019 this patient has predominantly venous insufficiency wounds on the right posterior calf right anterior tibial area and right lateral calf. She has 1 area that is closed over. Currently 3 wounds. We have been using silver collagen with Kerlix Coban. She has noncompressible ABIs but her peripheral pulses are palpable 2/7; predominantly venous wounds on the right posterior calf and right anterior tibia and right lateral calf. The right lateral calf is closed. She has a new open area which is small just below the area on the anterior tibia. She states this was not there when home health change the dressing 2 days ago. Most problematic area has been on the posterior calf 2/14; predominantly venous wounds on the right posterior calf right anterior and lateral calf are all closed. She has very fragile skin with large areas of hemosiderin deposition possibly some degree of solar skin damage. We had ordered her juxta lite stockings last week we have made no progress with this 07/07/19-Patient is reestablishing with wound clinic today after suffering a foot injury on 06/26/19 when she dropped narrating board at the independent living center she resides in. Patient went to ER where she was noted to have a 5 cm skin tear on the dorsum of the right foot she was given Keflex, set up with visiting nurses, and initiated on silver collagen dressing to the right foot which she has not been able to use on account of severe pain. Patient has just been using dressing with gauze. She has completed the Keflex course over 7 days. Patient now presents with open  wound  on the right dorsum with a slight hematoma on the right great toe base 8/6-Patient was in for a nurse visit but converted to physician visit on account of increasing swelling, pain, redness in the foot. Patient was dressed with silver collagen by home health patient also felt the silver collagen was causing some irritation and redness by itself Susan [name], Susan [name] ([id number]) [id number] Page 2 of 8 8/10-Patient returns from last week after initiating Augmentin for the left forefoot swelling, inflammation, painful ulcer with eschar, the dressing was changed to Iodoflex since the silver collagen was burning, the foot appears about the same if not slightly better this week. 8/17; readmitted to the clinic 2 weeks ago. This wound is traumatic on the right anterior foot secondary to an ironing board dropping on the area. She had an x- ray that was negative. There may have been secondary infection in this area and Dr. Burman Riis added Augmentin. From an infection point of view the area seems better. We have been using silver collagen 8/24; changed her dressing to Iodoflex last week to help with debridement of the wound. She complains of a lot of pain in the tip of her toes. She did not want debridement 8/31; arrives with a much better looking wound today. Has been using Iodoflex I changed her to [facility]. 9/14; continued improvement today wound health surface looks healthy. I changed her to [facility] last time. Surface area is improved 9/21; overall wound area although she does have some epithelialization coming out but is an [place] in the middle of her wound. Still using Hydrofera Blue. Wound areas on the right dorsal foot 10/5; vast majority of this is epithelialized. Much improved from 2 weeks using Hydrofera Blue 10/19; patient's wound is closed on the dorsal right foot. This was initially trauma. This was initially caused by our ironing board  trauma. 04/24/2023 Susan [name] is a 89 year old female with a past medical history of venous insufficiency, essential hypertension, and macular degeneration that presents to the clinic for a 1 week history of wound to her left lower extremity. She states that she hit her leg against a car door. She has been using Vaseline to the area. She currently denies signs of infection. 5/30; patient presents for follow-up. We have been using antibiotic ointment with Xeroform under Kerlix/Coban to the left lower extremity. She tolerated the wrap well. She has no issues or complaints today. Wound is smaller. 6/7; left lateral leg traumatic wound in the setting of chronic venous insufficiency we have been using mupirocin and gentamicin polymen under kerlix Coban. She is tolerating the dressing well 6/13; patient presents for follow-up. We have been using antibiotic ointment with PolyMem silver under Kerlix/Coban. The wound is almost healed. 6/20; patient presents for follow-up. We have been using Xeroform under Kerlix/Coban and her wound has healed. Electronic Signature(s) Signed: 05/24/2023 4:07:01 PM By: Geralyn Corwin DO Entered By: Geralyn Corwin on 05/24/2023 13:46:46 -------------------------------------------------------------------------------- Physical Exam Details Patient Name: Date of Service: Susan [name], Susan [name] 05/24/2023 1:15 PM Medical Record Number: [id number] Patient Account Number: [id number] Date of Birth/Sex: Treating RN: [birth year] (89 y.o. F) Primary Care Provider: Alysia Penna Other Clinician: Referring Provider: Treating Provider/Extender: Kerby Nora in Treatment: 4 Constitutional respirations regular, non-labored and within target range for patient.. Cardiovascular 2+ dorsalis pedis/posterior tibialis pulses. Psychiatric pleasant and cooperative. Notes T the left lower extremity there is epithelization to the previous wound  site. o Electronic Signature(s) Signed: 05/24/2023 4:07:01 PM By: Geralyn Corwin DO Entered By: Mikey Bussing,  Shanda Bumps on 05/24/2023 13:47:55 -------------------------------------------------------------------------------- Physician Orders Details Patient Name: Date of Service: Susan [name], Susan [name] 05/24/2023 1:15 PM Medical Record Number: [id number] Patient Account Number: [id number] Date of Birth/Sex: Treating RN: December 31, 1923 (89 y.o. [address], [place], [place] ([id number]) [id number] Page 3 of 8 Primary Care Provider: Alysia Penna Other Clinician: Referring Provider: Treating Provider/Extender: Kerby Nora in Treatment: 4 Verbal / Phone Orders: No Diagnosis Coding Discharge From [facility] Services Discharge from Wound Care Center - Congratulations your wound is healed! Electronic Signature(s) Signed: 05/24/2023 4:07:01 PM By: Geralyn Corwin DO Entered By: Geralyn Corwin on 05/24/2023 13:48:02 -------------------------------------------------------------------------------- Problem List Details Patient Name: Date of Service: Susan [name], Susan [name] 05/24/2023 1:15 PM Medical Record Number: [id number] Patient Account Number: [id number] Date of Birth/Sex: Treating RN: [birth year] (89 y.o. F) Primary Care Provider: Alysia Penna Other Clinician: Referring Provider: Treating Provider/Extender: Kerby Nora in Treatment: 4 Active Problems ICD-10 Encounter Code Description Active Date MDM Diagnosis [phone] Non-pressure chronic ulcer of other part of left lower leg with fat layer exposed5/21/2024 No Yes I87.312 Chronic venous hypertension (idiopathic) with ulcer of left lower extremity 04/24/2023 No Yes T79.8XXA Other early complications of trauma, initial encounter 04/24/2023 No Yes Inactive Problems Resolved Problems Electronic Signature(s) Signed: 05/24/2023 4:07:01 PM By: Geralyn Corwin DO Entered By:  Geralyn Corwin on 05/24/2023 13:46:08 -------------------------------------------------------------------------------- Progress Note Details Patient Name: Date of Service: Susan [name], Susan [name] 05/24/2023 1:15 PM Medical Record Number: [id number] Patient Account Number: [id number] Date of Birth/Sex: Treating RN: [birth year] (89 y.o. F) Primary Care Provider: Alysia Penna Other Clinician: Referring Provider: Treating Provider/Extender: Kerby Nora in Treatment: 4 [place], [place] ([id number]) [id number] Page 4 of 8 Subjective Chief Complaint Information obtained from Patient 07/07/19 - Patient is here for wound after injury to Right foot dorsum when she dropped an ironing board on it 10 days ago 04/24/2023; left lower extremity wound secondary to trauma History of Present Illness (HPI) ADMISSION 12/20/2018 This is a 89 year old woman who lives in the independent part of [facility]. She has had and had a long history of wounds on the right lower extremity times years which apparently have been on and off. She was hospitalized from 12/06/2018 through 12/08/2018 with right greater than left lower extremity cellulitis she received IV cefazolin. She was discharged on oral clindamycin which she is completed she left with an order for Santyl. She has been using this daily to the wounds. She admits to lower extremity edema for several years. Past medical history of a CVA, hypertension. She is not a diabetic. ABI in our clinic was noncompressible 12/26/2018; this patient has wounds on the anterior right lower extremity in a worrisome area posteriorly. She was hospitalized early in January with cellulitis in the right leg as well. We ordered silver collagen last week however home health use silver alginate 01/03/2019 this patient has predominantly venous insufficiency wounds on the right posterior calf right anterior tibial area and  right lateral calf. She has 1 area that is closed over. Currently 3 wounds. We have been using silver collagen with Kerlix Coban. She has noncompressible ABIs but her peripheral pulses are palpable 2/7; predominantly venous wounds on the right posterior calf and right anterior tibia and right lateral calf. The right lateral calf is closed. She has a new open area which is small just below the area on the anterior tibia. She states this was not there when home health change the dressing 2 days ago. Most problematic area has been on the posterior  calf 2/14; predominantly venous wounds on the right posterior calf right anterior and lateral calf are all closed. She has very fragile skin with large areas of hemosiderin deposition possibly some degree of solar skin damage. We had ordered her juxta lite stockings last week we have made no progress with this 07/07/19-Patient is reestablishing with wound clinic today after suffering a foot injury on 06/26/19 when she dropped narrating board at the independent living center she resides in. Patient went to ER where she was noted to have a 5 cm skin tear on the dorsum of the right foot she was given Keflex, set up with visiting nurses, and initiated on silver collagen dressing to the right foot which she has not been able to use on account of severe pain. Patient has just been using dressing with gauze. She has completed the Keflex course over 7 days. Patient now presents with open wound on the right dorsum with a slight hematoma on the right great toe base 8/6-Patient was in for a nurse visit but converted to physician visit on account of increasing swelling, pain, redness in the foot. Patient was dressed with silver collagen by home health patient also felt the silver collagen was causing some irritation and redness by itself 8/10-Patient returns from last week after initiating Augmentin for the left forefoot swelling, inflammation, painful ulcer with eschar, the  dressing was changed to Iodoflex since the silver collagen was burning, the foot appears about the same if not slightly better this week. 8/17; readmitted to the clinic 2 weeks ago. This wound is traumatic on the right anterior foot secondary to an ironing board dropping on the area. She had an x- ray that was negative. There may have been secondary infection in this area and Dr. Burman Riis added Augmentin. From an infection point of view the area seems better. We have been using silver collagen 8/24; changed her dressing to Iodoflex last week to help with debridement of the wound. She complains of a lot of pain in the tip of her toes. She did not want debridement 8/31; arrives with a much better looking wound today. Has been using Iodoflex I changed her to [facility]. 9/14; continued improvement today wound health surface looks healthy. I changed her to [facility] last time. Surface area is improved 9/21; overall wound area although she does have some epithelialization coming out but is an [place] in the middle of her wound. Still using Hydrofera Blue. Wound areas on the right dorsal foot 10/5; vast majority of this is epithelialized. Much improved from 2 weeks using Hydrofera Blue 10/19; patient's wound is closed on the dorsal right foot. This was initially trauma. This was initially caused by our ironing board trauma. 04/24/2023 Ms. Joaquina Nissen is a 89 year old female with a past medical history of venous insufficiency, essential hypertension, and macular degeneration that presents to the clinic for a 1 week history of wound to her left lower extremity. She states that she hit her leg against a car door. She has been using Vaseline to the area. She currently denies signs of infection. 5/30; patient presents for follow-up. We have been using antibiotic ointment with Xeroform under Kerlix/Coban to the left lower extremity. She tolerated the wrap well. She has no issues or complaints today.  Wound is smaller. 6/7; left lateral leg traumatic wound in the setting of chronic venous insufficiency we have been using mupirocin and gentamicin polymen under kerlix Coban. She is tolerating the dressing well 6/13; patient presents for follow-up.  We have been using antibiotic ointment with PolyMem silver under Kerlix/Coban. The wound is almost healed. 6/20; patient presents for follow-up. We have been using Xeroform under Kerlix/Coban and her wound has healed. Patient History Information obtained from Patient. Family History Heart Disease - Mother,Father,Maternal Grandparents,Paternal Grandparents, Hypertension - Mother, Stroke - Father,Mother,Maternal Grandparents, No family history of Cancer, Diabetes, Hereditary Spherocytosis, Kidney Disease, Lung Disease, Seizures, Thyroid Problems, Tuberculosis. Social History Never smoker, Marital Status - Widowed, Alcohol Use - Moderate, Drug Use - No History, Caffeine Use - Never. Medical History Eyes Patient has history of Cataracts - removed Denies history of Glaucoma, Optic Neuritis Ear/Nose/Mouth/Throat Denies history of Chronic sinus problems/congestion, Middle ear problems Hematologic/Lymphatic Denies history of Anemia, Hemophilia, Human Immunodeficiency Virus, Lymphedema, Sickle Cell Disease Respiratory Denies history of Aspiration, Asthma, Chronic Obstructive Pulmonary Disease (COPD), Pneumothorax, Sleep Apnea Cardiovascular Susan [name], Susan [name] ([id number]) [id number] Page 5 of 8 Patient has history of Hypertension, Peripheral Venous Disease Denies history of Angina, Arrhythmia, Congestive Heart Failure, Coronary Artery Disease, Deep Vein Thrombosis, Hypotension, Myocardial Infarction, Peripheral Arterial Disease, Phlebitis, Vasculitis Gastrointestinal Denies history of Cirrhosis , Colitis, Crohns, Hepatitis A, Hepatitis B, Hepatitis C Integumentary (Skin) Denies history of History of  Burn Musculoskeletal Patient has history of Osteoarthritis Hospitalization/Surgery History - cellulitis. - appendectomy. - cholecystectomy. - tonsillectomy. - CVA 2015. Medical A Surgical History Notes nd Constitutional Symptoms (General Health) stroke 2015 Eyes macular degeneration Gastrointestinal diverticulosis GERD stomach ulcers IBS Genitourinary Kidney stones Immunological Fibromyalgia Integumentary (Skin) skin Ca Psychiatric anxiety Objective Constitutional respirations regular, non-labored and within target range for patient.. Vitals Time Taken: 1:19 PM, Height: 61 in, Weight: 107 lbs, BMI: 20.2, Temperature: 98.3 F, Pulse: 59 bpm, Respiratory Rate: 16 breaths/min, Blood Pressure: 157/76 mmHg. Cardiovascular 2+ dorsalis pedis/posterior tibialis pulses. Psychiatric pleasant and cooperative. General Notes: T the left lower extremity there is epithelization to the previous wound site. o Integumentary (Hair, Skin) Wound #6 status is Healed - Epithelialized. Original cause of wound was Trauma. The date acquired was: 04/17/2023. The wound has been in treatment 4 weeks. The wound is located on the Left,Lateral Lower Leg. The wound measures 0cm length x 0cm width x 0cm depth; 0cm^2 area and 0cm^3 volume. There is no tunneling or undermining noted. There is a none present amount of drainage noted. There is no granulation within the wound bed. There is no necrotic tissue within the wound bed. The periwound skin appearance had no abnormalities noted for texture. The periwound skin appearance had no abnormalities noted for moisture. The periwound skin appearance had no abnormalities noted for color. Periwound temperature was noted as No Abnormality. Assessment Active Problems ICD-10 Non-pressure chronic ulcer of other part of left lower leg with fat layer exposed Chronic venous hypertension (idiopathic) with ulcer of left lower extremity Other early complications of trauma,  initial encounter Patient has done well with Xeroform under Kerlix/Coban. Her wound is healed. This was a trauma wound. She does not have a history of spontaneous wounds to her legs. At this time nothing further to do. She knows to call with any questions or concerns. She may follow-up as needed. Plan Discharge From [facility] Services: Discharge from Wound Care Center - Congratulations your wound is healed! Susan [name], Susan [name] ([id number]) [id number] Page 6 of 8 1. Discharge from clinic due to closed wound 2. Follow-up as needed Electronic Signature(s) Signed: 05/24/2023 4:07:01 PM By: Geralyn Corwin DO Entered By: Geralyn Corwin on 05/24/2023 13:49:16 -------------------------------------------------------------------------------- HxROS Details Patient Name: Date of Service: Susan [name], Susan [name] 05/24/2023 1:15 PM Medical Record Number:  [id number] Patient Account Number: [id number] Date of Birth/Sex: Treating RN: 10-31-1924 (89 y.o. F) Primary Care Provider: Alysia Penna Other Clinician: Referring Provider: Treating Provider/Extender: Kerby Nora in Treatment: 4 Information Obtained From Patient Constitutional Symptoms (General Health) Medical History: Past Medical History Notes: stroke 2015 Eyes Medical History: Positive for: Cataracts - removed Negative for: Glaucoma; Optic Neuritis Past Medical History Notes: macular degeneration Ear/Nose/Mouth/Throat Medical History: Negative for: Chronic sinus problems/congestion; Middle ear problems Hematologic/Lymphatic Medical History: Negative for: Anemia; Hemophilia; Human Immunodeficiency Virus; Lymphedema; Sickle Cell Disease Respiratory Medical History: Negative for: Aspiration; Asthma; Chronic Obstructive Pulmonary Disease (COPD); Pneumothorax; Sleep Apnea Cardiovascular Medical History: Positive for: Hypertension; Peripheral Venous Disease Negative for: Angina; Arrhythmia;  Congestive Heart Failure; Coronary Artery Disease; Deep Vein Thrombosis; Hypotension; Myocardial Infarction; Peripheral Arterial Disease; Phlebitis; Vasculitis Gastrointestinal Medical History: Negative for: Cirrhosis ; Colitis; Crohns; Hepatitis A; Hepatitis B; Hepatitis C Past Medical History Notes: diverticulosis GERD stomach ulcers IBS Genitourinary Susan [name], Susan [name] ([id number]) [phone].pdf Page 7 of 8 Medical History: Past Medical History Notes: Kidney stones Immunological Medical History: Past Medical History Notes: Fibromyalgia Integumentary (Skin) Medical History: Negative for: History of Burn Past Medical History Notes: skin Ca Musculoskeletal Medical History: Positive for: Osteoarthritis Psychiatric Medical History: Past Medical History Notes: anxiety HBO Extended History Items Eyes: Cataracts Immunizations Pneumococcal Vaccine: Received Pneumococcal Vaccination: Yes Received Pneumococcal Vaccination On or After 60th Birthday: Yes Implantable Devices None Hospitalization / Surgery History Type of Hospitalization/Surgery cellulitis appendectomy cholecystectomy tonsillectomy CVA 2015 Family and Social History Cancer: No; Diabetes: No; Heart Disease: Yes - Mother,Father,Maternal Grandparents,Paternal Grandparents; Hereditary Spherocytosis: No; Hypertension: Yes - Mother; Kidney Disease: No; Lung Disease: No; Seizures: No; Stroke: Yes - Father,Mother,Maternal Grandparents; Thyroid Problems: No; Tuberculosis: No; Never smoker; Marital Status - Widowed; Alcohol Use: Moderate; Drug Use: No History; Caffeine Use: Never; Financial Concerns: No; Food, Clothing or Shelter Needs: No; Support System Lacking: No; Transportation Concerns: No Electronic Signature(s) Signed: 05/24/2023 4:07:01 PM By: Geralyn Corwin DO Entered By: Geralyn Corwin on 05/24/2023  13:46:59 -------------------------------------------------------------------------------- SuperBill Details Patient Name: Date of Service: Susan [name], MACIEJEWSKI 05/24/2023 Medical Record Number: [id number] Patient Account Number: [id number] Date of Birth/Sex: Treating RN: 11/11/1924 (89 y.o. F) Primary Care Provider: Alysia Penna Other Clinician: Referring Provider: Treating Provider/Extender: Kerby Nora in Treatment: 4 [place], [place] ([id number]) [id number] Page 8 of 8 Diagnosis Coding ICD-10 Codes Code Description [phone] Non-pressure chronic ulcer of other part of left lower leg with fat layer exposed I87.312 Chronic venous hypertension (idiopathic) with ulcer of left lower extremity T79.8XXA Other early complications of trauma, initial encounter Facility Procedures : CPT4 Code: 64332951 Description: 99213 - WOUND CARE VISIT-LEV 3 EST PT Modifier: Quantity: 1 Physician Procedures : CPT4 Code Description Modifier [id number] - WC PHYS LEVEL 3 - EST PT ICD-10 Diagnosis Description L97.822 Non-pressure chronic ulcer of other part of left lower leg with fat layer exposed I87.312 Chronic venous 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

## 2023-05-25 NOTE — Progress Notes (Signed)
Subjective:   Susan [name] is a 89 y.o. female who presents for Medicare Annual (Subsequent) preventive examination.  Visit Complete: Virtual  I connected with  Susan [name] on 05/25/23 by a audio enabled telemedicine application and verified that I am speaking with the correct person using two identifiers.  Patient Location: Home  Provider Location: Office/Clinic  I discussed the limitations of evaluation and management by telemedicine. The patient expressed understanding and agreed to proceed.    Review of Systems     Cardiac Risk Factors include: advanced age (>59men, >66 women);dyslipidemia;hypertension     Objective:    Today's Vitals   05/25/23 1112  Weight: 107 lb (48.5 kg)  Height: 5' (1.524 m)  PainSc: 5    Body mass index is 20.9 kg/m.     05/25/2023   11:24 AM 05/23/2022   11:53 AM 06/30/2021   11:33 AM 05/17/2021    1:02 PM 10/06/2020   11:55 AM 05/19/2020    8:58 PM 06/26/2019    4:59 PM  Advanced Directives  Does Patient Have a Medical Advance Directive? Yes Yes Yes Yes Yes Yes Yes  Type of Estate agent of [place];Living will Healthcare Power of [place];Living will Out of facility DNR (pink MOST or yellow form)  Healthcare Power of [place];Living will Healthcare Power of [place];Living will Healthcare Power of Attorney Living will;Healthcare Power of Attorney  Does patient want to make changes to medical advance directive? No - Patient declined     No - Patient declined   Copy of Healthcare Power of Attorney in Chart? Yes - validated most recent copy scanned in chart (See row information) No - copy requested  Yes - validated most recent copy scanned in chart (See row information)  No - copy requested, Physician notified   Would patient like information on creating a medical advance directive?      No - Patient declined      Significant value    Current Medications (verified) Outpatient Encounter Medications as of 05/25/2023   Medication Sig   acetaminophen (TYLENOL) 650 MG CR tablet Take 650 mg by mouth every 8 (eight) hours as needed for pain.   ALPRAZolam (XANAX) 0.5 MG tablet Take 1 tablet (0.5 mg total) by mouth daily as needed for anxiety.   aspirin EC 325 MG tablet Take 325 mg by mouth daily.   cholestyramine (QUESTRAN) 4 g packet DISSOLVE & TAKE 1 POWDER BY MOUTH TWICE DAILY   dicyclomine (BENTYL) 10 MG capsule Take 1 capsule by mouth twice daily   diphenoxylate-atropine (LOMOTIL) 2.5-0.025 MG tablet TAKE 1 TABLET BY MOUTH TWICE DAILY AS NEEDED FOR DIARRHEA OR LOOSE STOOLS   DULoxetine (CYMBALTA) 20 MG capsule Take 1 capsule by mouth once daily   famotidine (PEPCID) 20 MG tablet Take 1 tablet by mouth once daily   fexofenadine (ALLEGRA) 180 MG tablet Take 180 mg by mouth daily.   fluticasone (FLONASE) 50 MCG/ACT nasal spray Use 1 spray(s) in each nostril twice daily   furosemide (LASIX) 20 MG tablet Take 1 tablet by mouth once daily   IRON PO Take 65 mg by mouth 3 (three) times a week.   losartan (COZAAR) 50 MG tablet Take 2 tablets (100 mg total) by mouth daily.   metoprolol succinate (TOPROL-XL) 25 MG 24 hr tablet Take 1 tablet (25 mg total) by mouth daily.   potassium chloride SA (KLOR-CON M) 20 MEQ tablet Take 1 tablet (20 mEq total) by mouth daily.   sodium chloride (OCEAN)  0.65 % SOLN nasal spray Place 1 spray into both nostrils as needed for congestion.   Vitamin D, Ergocalciferol, (DRISDOL) 1.25 MG (50000 UNIT) CAPS capsule Take 1 capsule by mouth once a week   No facility-administered encounter medications on file as of 05/25/2023.    Allergies (verified) Butrans [buprenorphine], Codeine, and Morphine and codeine   History: Past Medical History:  Diagnosis Date   Actinic keratosis, hx of    chest wall (2012)   Allergic rhinitis    Anxiety    Arthritis    "spine, hips" (01/30/2017)   Bursitis    Cellulitis of right foot 01/30/2017   Colon polyps    Diverticulosis    Esophageal  dysmotilities 10/16/2017   Fibromyalgia    "qwhere" (01/30/2017)   Gallstones    GERD (gastroesophageal reflux disease)    High cholesterol    History of kidney stones    History of stomach ulcers    Hx of squamous cell carcinoma excision    right mandible (2011), right arm (2014)   Hypertension    IBS (irritable bowel syndrome)    Lymphangitis 12/06/2018   Osteoarthritis    Pneumonia 2000s X 2   "twice" (01/30/2017)   Pyloric stenosis    Stroke (HCC) 10/2014   denies residual on 01/30/2017   Past Surgical History:  Procedure Laterality Date   APPENDECTOMY     CARDIOVASCULAR STRESS TEST  04/2016   nuclear cardiolite stress test done in [place] (no ischemia, no LVH, no LV systolic function)   CATARACT EXTRACTION W/ INTRAOCULAR LENS  IMPLANT, BILATERAL Bilateral    CHOLECYSTECTOMY OPEN     COLONOSCOPY  2009, 2014   DIAGNOSTIC MAMMOGRAM  2009, 2012   TONSILLECTOMY     Family History  Problem Relation Age of Onset   Stroke Mother    Heart attack Mother    Stroke Father    Heart attack Father    Heart attack Maternal Grandmother    Stroke Maternal Grandmother    Stroke Paternal Grandmother    Heart attack Paternal Grandmother    Heart attack Maternal Grandfather    Stroke Maternal Grandfather    Stroke Paternal Grandfather    Heart attack Paternal Grandfather    Colon cancer Neg Hx    Pancreatic cancer Neg Hx    Stomach cancer Neg Hx    Esophageal cancer Neg Hx    Social History   Socioeconomic History   Marital status: Widowed    Spouse name: Not on file   Number of children: 4   Years of education: Not on file   Highest education level: Not on file  Occupational History   Occupation: retired  Tobacco Use   Smoking status: Never   Smokeless tobacco: Never  Vaping Use   Vaping Use: Never used  Substance and Sexual Activity   Alcohol use: Not Currently    Comment: glass of red wine maybe once week   Drug use: No   Sexual activity: Not on file  Other  Topics Concern   Not on file  Social History Narrative   Not on file   Social Determinants of Health   Financial Resource Strain: Low Risk  (05/25/2023)   Overall Financial Resource Strain (CARDIA)    Difficulty of Paying Living Expenses: Not hard at all  Food Insecurity: No Food Insecurity (05/25/2023)   Hunger Vital Sign    Worried About Running Out of Food in the Last Year: Never true    Ran Out  of Food in the Last Year: Never true  Transportation Needs: No Transportation Needs (05/25/2023)   PRAPARE - Administrator, Civil Service (Medical): No    Lack of Transportation (Non-Medical): No  Physical Activity: Inactive (05/25/2023)   Exercise Vital Sign    Days of Exercise per Week: 0 days    Minutes of Exercise per Session: 0 min  Stress: No Stress Concern Present (05/25/2023)   [employer] of Occupational Health - Occupational Stress Questionnaire    Feeling of Stress : Only a little  Social Connections: Patient Declined (05/25/2023)   Social Connection and Isolation Panel [NHANES]    Frequency of Communication with Friends and Family: Patient declined    Frequency of Social Gatherings with Friends and Family: Patient declined    Attends Religious Services: Patient declined    Database administrator or Organizations: Patient declined    Attends Engineer, structural: Patient declined    Marital Status: Patient declined    Tobacco Counseling Counseling given: Not Answered   Clinical Intake:  Pre-visit preparation completed: Yes  Pain : 0-10 Pain Score: 5  Pain Type: Chronic pain Pain Location: Generalized Pain Descriptors / Indicators: Aching Pain Onset: More than a month ago Pain Frequency: Constant     Nutritional Status: BMI of 19-24  Normal Nutritional Risks: Nausea/ vomitting/ diarrhea (has chronic diarrhea) Diabetes: No  How often do you need to have someone help you when you read instructions, pamphlets, or other written materials  from your doctor or pharmacy?: 1 - Never  Interpreter Needed?: No  Information entered by :: NAllen LPN   Activities of Daily Living    05/25/2023   11:15 AM  In your present state of health, do you have any difficulty performing the following activities:  Hearing? 1  Comment has hearing aids  Vision? 0  Difficulty concentrating or making decisions? 1  Comment trouble with memory  Walking or climbing stairs? 1  Dressing or bathing? 0  Doing errands, shopping? 1  Comment does not drive  Preparing Food and eating ? N  Using the Toilet? N  In the past six months, have you accidently leaked urine? N  Do you have problems with loss of bowel control? N  Managing your Medications? N  Managing your Finances? N  Housekeeping or managing your Housekeeping? N    Patient Care Team: Nche, Bonna Gains, NP as PCP - General (Internal Medicine)  Indicate any recent Medical Services you may have received from other than [facility] providers in the past year (date may be approximate).     Assessment:   This is a routine wellness examination for Susan [name].  Hearing/Vision screen Vision Screening - Comments:: No regular eye exams,   Dietary issues and exercise activities discussed:     Goals Addressed             This Visit's Progress    Patient Stated       05/25/2023, no goals       Depression Screen    05/25/2023   11:26 AM 01/08/2023    1:35 PM 01/08/2023    1:21 PM 05/23/2022   11:53 AM 04/03/2022    2:53 PM 04/03/2022    2:52 PM 12/28/2021    2:47 PM  PHQ 2/9 Scores  PHQ - 2 Score 0 2 2 0 0 0 0  PHQ- 9 Score  9   0  4    Fall Risk    05/25/2023  11:25 AM 01/08/2023    1:21 PM 05/23/2022   11:54 AM 12/28/2021    2:03 PM 05/17/2021    1:05 PM  Fall Risk   Falls in the past year? 0  0 0 0  Number falls in past yr: 0 0 0 0 0  Injury with Fall? 0 0 0 0 0  Risk for fall due to : Medication side effect;Impaired mobility;Impaired balance/gait No Fall Risks  No Fall Risks   Follow  up Falls prevention discussed;Education provided;Falls evaluation completed  Falls evaluation completed Falls evaluation completed Falls evaluation completed;Falls prevention discussed  Comment   walker      MEDICARE RISK AT HOME:  Medicare Risk at Home - 05/25/23 1125     Any stairs in or around the home? No    If so, are there any without handrails? No    Home free of loose throw rugs in walkways, pet beds, electrical cords, etc? Yes    Adequate lighting in your home to reduce risk of falls? Yes    Life alert? Yes    Use of a cane, walker or w/c? Yes    Grab bars in the bathroom? Yes    Shower chair or bench in shower? Yes    Elevated toilet seat or a handicapped toilet? Yes             TIMED UP AND GO:  Was the test performed?  No    Cognitive Function:    01/08/2023    2:20 PM 05/16/2017   11:16 AM  MMSE - Mini Mental State Exam  Orientation to time 5 5  Orientation to Place 5 5  Registration 3 3  Attention/ Calculation 5 5  Recall 1 2  Language- name 2 objects 2 2  Language- repeat 1 1  Language- follow 3 step command 3 3  Language- read & follow direction 1 1  Write a sentence 1 1  Copy design 1 1  Total score 28 29        05/25/2023   11:26 AM 05/17/2021    1:10 PM  6CIT Screen  What Year? 0 points 0 points  What month? 0 points 0 points  What time? 0 points 0 points  Count back from 20 0 points 2 points  Months in reverse 0 points 0 points  Repeat phrase 0 points 0 points  Total Score 0 points 2 points    Immunizations Immunization History  Administered Date(s) Administered   Fluad Quad(high Dose 65+) 08/15/2019, 09/03/2020   Influenza, High Dose Seasonal PF 09/07/2017, 09/11/2018   Influenza-Unspecified 08/12/2021, 08/11/2022   Moderna Covid-19 Vaccine Bivalent Booster 6yrs & up 11/15/2021   Moderna Sars-Covid-2 Vaccination 12/20/2019, 01/20/2020, 09/30/2020, 04/12/2021   Pneumococcal Conjugate-13 05/16/2017   Unspecified SARS-COV-2  Vaccination 01/03/2023    TDAP status: Up to date  Flu Vaccine status: Up to date  Pneumococcal vaccine status: Up to date  Covid-19 vaccine status: Completed vaccines  Qualifies for Shingles Vaccine? Yes   Zostavax completed No   Shingrix Completed?: No.    Education has been provided regarding the importance of this vaccine. Patient has been advised to call insurance company to determine out of pocket expense if they have not yet received this vaccine. Advised may also receive vaccine at local pharmacy or Health Dept. Verbalized acceptance and understanding.  Screening Tests Health Maintenance  Topic Date Due   DTaP/Tdap/Td (1 - Tdap) Never done   Zoster Vaccines- Shingrix (1 of 2) Never  done   Pneumonia Vaccine 36+ Years old (2 of 2 - PPSV23 or PCV20) 05/16/2018   COVID-19 Vaccine (7 - 2023-24 season) 02/28/2023   Medicare Annual Wellness (AWV)  05/24/2023   INFLUENZA VACCINE  07/05/2023   DEXA SCAN  Completed   HPV VACCINES  Aged Out    Health Maintenance  Health Maintenance Due  Topic Date Due   DTaP/Tdap/Td (1 - Tdap) Never done   Zoster Vaccines- Shingrix (1 of 2) Never done   Pneumonia Vaccine 92+ Years old (2 of 2 - PPSV23 or PCV20) 05/16/2018   COVID-19 Vaccine (7 - 2023-24 season) 02/28/2023   Medicare Annual Wellness (AWV)  05/24/2023    Colorectal cancer screening: No longer required.   Mammogram status: No longer required due to age.  Bone Density status: Completed 12/05/2007.   Lung Cancer Screening: (Low Dose CT Chest recommended if Age 35-80 years, 20 pack-year currently smoking OR have quit w/in 15years.) does not qualify.   Lung Cancer Screening Referral: no  Additional Screening:  Hepatitis C Screening: does not qualify;   Vision Screening: Recommended annual ophthalmology exams for early detection of glaucoma and other disorders of the eye. Is the patient up to date with their annual eye exam?  Yes  Who is the provider or what is the name of  the office in which the patient attends annual eye exams? Can't remember name If pt is not established with a provider, would they like to be referred to a provider to establish care? No .   Dental Screening: Recommended annual dental exams for proper oral hygiene  Diabetic Foot Exam: n/a  Community Resource Referral / Chronic Care Management: CRR required this visit?  No   CCM required this visit?  No     Plan:     I have personally reviewed and noted the following in the patient's chart:   Medical and social history Use of alcohol, tobacco or illicit drugs  Current medications and supplements including opioid prescriptions. Patient is not currently taking opioid prescriptions. Functional ability and status Nutritional status Physical activity Advanced directives List of other physicians Hospitalizations, surgeries, and ER visits in previous 12 months Vitals Screenings to include cognitive, depression, and falls Referrals and appointments  In addition, I have reviewed and discussed with patient certain preventive protocols, quality metrics, and best practice recommendations. A written personalized care plan for preventive services as well as general preventive health recommendations were provided to patient.     Barb Merino, LPN   01/03/8656   After Visit Summary: (MyChart) Due to this being a telephonic visit, the after visit summary with patients personalized plan was offered to patient via MyChart   Nurse Notes: none

## 2023-05-25 NOTE — Patient Instructions (Signed)
Ms. Tschida , Thank you for taking time to come for your Medicare Wellness Visit. I appreciate your ongoing commitment to your health goals. Please review the following plan we discussed and let me know if I can assist you in the future.   These are the goals we discussed:  Goals      Chronic Care Management     CARE PLAN ENTRY  Current Barriers:  Chronic Disease Management support, education, and care coordination needs related to hypertension, irritable bowel syndrome, hyperlipidemia, GERD  Clinical Goal(s): Over the next 180 days, patient will:  Work with the care management team to address educational, disease management, and care coordination needs  Begin or continue self health monitoring activities as directed today  Call provider office for new or worsened signs and symptoms  Call care management team with questions or concerns Maintain blood pressure less than 140/80  Interventions:  Evaluation of current treatment plans and patient's adherence to plan as established by provider Assessed patient understanding of disease states Assessed patient's education and care coordination needs Provided disease specific education to patient   Telephone follow up appointment with pharmacy team member scheduled for: 09/03/20 at 10:30 AM     Patient Stated     05/25/2023, no goals     To be as active and as independent as possible        This is a list of the screening recommended for you and due dates:  Health Maintenance  Topic Date Due   DTaP/Tdap/Td vaccine (1 - Tdap) Never done   Zoster (Shingles) Vaccine (1 of 2) Never done   Pneumonia Vaccine (2 of 2 - PPSV23 or PCV20) 05/16/2018   COVID-19 Vaccine (7 - 2023-24 season) 02/28/2023   Flu Shot  07/05/2023   Medicare Annual Wellness Visit  05/24/2024   DEXA scan (bone density measurement)  Completed   HPV Vaccine  Aged Out    Advanced directives: copy in chart  Conditions/risks identified: none  Next appointment: Follow  up in one year for your annual wellness visit    Preventive Care 65 Years and Older, Female Preventive care refers to lifestyle choices and visits with your health care provider that can promote health and wellness. What does preventive care include? A yearly physical exam. This is also called an annual well check. Dental exams once or twice a year. Routine eye exams. Ask your health care provider how often you should have your eyes checked. Personal lifestyle choices, including: Daily care of your teeth and gums. Regular physical activity. Eating a healthy diet. Avoiding tobacco and drug use. Limiting alcohol use. Practicing safe sex. Taking low-dose aspirin every day. Taking vitamin and mineral supplements as recommended by your health care provider. What happens during an annual well check? The services and screenings done by your health care provider during your annual well check will depend on your age, overall health, lifestyle risk factors, and family history of disease. Counseling  Your health care provider may ask you questions about your: Alcohol use. Tobacco use. Drug use. Emotional well-being. Home and relationship well-being. Sexual activity. Eating habits. History of falls. Memory and ability to understand (cognition). Work and work Astronomer. Reproductive health. Screening  You may have the following tests or measurements: Height, weight, and BMI. Blood pressure. Lipid and cholesterol levels. These may be checked every 5 years, or more frequently if you are over 82 years old. Skin check. Lung cancer screening. You may have this screening every year starting at age  55 if you have a 30-pack-year history of smoking and currently smoke or have quit within the past 15 years. Fecal occult blood test (FOBT) of the stool. You may have this test every year starting at age 1. Flexible sigmoidoscopy or colonoscopy. You may have a sigmoidoscopy every 5 years or a  colonoscopy every 10 years starting at age 19. Hepatitis C blood test. Hepatitis B blood test. Sexually transmitted disease (STD) testing. Diabetes screening. This is done by checking your blood sugar (glucose) after you have not eaten for a while (fasting). You may have this done every 1-3 years. Bone density scan. This is done to screen for osteoporosis. You may have this done starting at age 2. Mammogram. This may be done every 1-2 years. Talk to your health care provider about how often you should have regular mammograms. Talk with your health care provider about your test results, treatment options, and if necessary, the need for more tests. Vaccines  Your health care provider may recommend certain vaccines, such as: Influenza vaccine. This is recommended every year. Tetanus, diphtheria, and acellular pertussis (Tdap, Td) vaccine. You may need a Td booster every 10 years. Zoster vaccine. You may need this after age 61. Pneumococcal 13-valent conjugate (PCV13) vaccine. One dose is recommended after age 42. Pneumococcal polysaccharide (PPSV23) vaccine. One dose is recommended after age 21. Talk to your health care provider about which screenings and vaccines you need and how often you need them. This information is not intended to replace advice given to you by your health care provider. Make sure you discuss any questions you have with your health care provider. Document Released: 12/17/2015 Document Revised: 08/09/2016 Document Reviewed: 09/21/2015 Elsevier Interactive Patient Education  2017 [employer].  Fall Prevention in the Home Falls can cause injuries. They can happen to people of all ages. There are many things you can do to make your home safe and to help prevent falls. What can I do on the outside of my home? Regularly fix the edges of walkways and driveways and fix any cracks. Remove anything that might make you trip as you walk through a door, such as a raised step or  threshold. Trim any bushes or trees on the path to your home. Use bright outdoor lighting. Clear any walking paths of anything that might make someone trip, such as rocks or tools. Regularly check to see if handrails are loose or broken. Make sure that both sides of any steps have handrails. Any raised decks and porches should have guardrails on the edges. Have any leaves, snow, or ice cleared regularly. Use sand or salt on walking paths during winter. Clean up any spills in your garage right away. This includes oil or grease spills. What can I do in the bathroom? Use night lights. Install grab bars by the toilet and in the tub and shower. Do not use towel bars as grab bars. Use non-skid mats or decals in the tub or shower. If you need to sit down in the shower, use a plastic, non-slip stool. Keep the floor dry. Clean up any water that spills on the floor as soon as it happens. Remove soap buildup in the tub or shower regularly. Attach bath mats securely with double-sided non-slip rug tape. Do not have throw rugs and other things on the floor that can make you trip. What can I do in the bedroom? Use night lights. Make sure that you have a light by your bed that is easy to reach.  Do not use any sheets or blankets that are too big for your bed. They should not hang down onto the floor. Have a firm chair that has side arms. You can use this for support while you get dressed. Do not have throw rugs and other things on the floor that can make you trip. What can I do in the kitchen? Clean up any spills right away. Avoid walking on wet floors. Keep items that you use a lot in easy-to-reach places. If you need to reach something above you, use a strong step stool that has a grab bar. Keep electrical cords out of the way. Do not use floor polish or wax that makes floors slippery. If you must use wax, use non-skid floor wax. Do not have throw rugs and other things on the floor that can make you  trip. What can I do with my stairs? Do not leave any items on the stairs. Make sure that there are handrails on both sides of the stairs and use them. Fix handrails that are broken or loose. Make sure that handrails are as long as the stairways. Check any carpeting to make sure that it is firmly attached to the stairs. Fix any carpet that is loose or worn. Avoid having throw rugs at the top or bottom of the stairs. If you do have throw rugs, attach them to the floor with carpet tape. Make sure that you have a light switch at the top of the stairs and the bottom of the stairs. If you do not have them, ask someone to add them for you. What else can I do to help prevent falls? Wear shoes that: Do not have high heels. Have rubber bottoms. Are comfortable and fit you well. Are closed at the toe. Do not wear sandals. If you use a stepladder: Make sure that it is fully opened. Do not climb a closed stepladder. Make sure that both sides of the stepladder are locked into place. Ask someone to hold it for you, if possible. Clearly mark and make sure that you can see: Any grab bars or handrails. First and last steps. Where the edge of each step is. Use tools that help you move around (mobility aids) if they are needed. These include: Canes. Walkers. Scooters. Crutches. Turn on the lights when you go into a dark area. Replace any light bulbs as soon as they burn out. Set up your furniture so you have a clear path. Avoid moving your furniture around. If any of your floors are uneven, fix them. If there are any pets around you, be aware of where they are. Review your medicines with your doctor. Some medicines can make you feel dizzy. This can increase your chance of falling. Ask your doctor what other things that you can do to help prevent falls. This information is not intended to replace advice given to you by your health care provider. Make sure you discuss any questions you have with your  health care provider. Document Released: 09/16/2009 Document Revised: 04/27/2016 Document Reviewed: 12/25/2014 Elsevier Interactive Patient Education  2017 [employer].

## 2023-06-04 NOTE — Progress Notes (Signed)
VRINDA, NASSIRI ([id number]) [id number] Page 1 of 9 Visit Report for 04/24/2023 Chief Complaint Document Details Patient Name: Date of Service: Susan [name], Susan [name] 04/24/2023 1:15 PM Medical Record Number: [id number] Patient Account Number: [id number] Date of Birth/Sex: Treating RN: Susan [name] (89 y.o. F) Primary Care Provider: Alysia [name] Other Clinician: Referring Provider: Treating Provider/Extender: Susan [name] in Treatment: 0 Information Obtained from: Patient Chief Complaint 07/07/19 - Patient is here for wound after injury to Right foot dorsum when she dropped an ironing board on it 10 days ago 04/24/2023; left lower extremity wound secondary to trauma Electronic Signature(s) Signed: 04/24/2023 4:32:32 PM By: Susan Corwin DO Entered By: Susan [name] on 04/24/2023 14:29:43 -------------------------------------------------------------------------------- HPI Details Patient Name: Date of Service: Susan [name], Susan [name] 04/24/2023 1:15 PM Medical Record Number: [id number] Patient Account Number: [id number] Date of Birth/Sex: Treating RN: [birth year]-[name] (89 y.o. F) Primary Care Provider: Alysia [name] Other Clinician: Referring Provider: Treating Provider/Extender: Susan [name] in Treatment: 0 History of Present Illness HPI Description: ADMISSION 12/20/2018 This is a 89 year old woman who lives in the independent [facility] of [facility]. She has had and had a long history of wounds on the right lower extremity times years which apparently have been on and off. She was hospitalized from 12/06/2018 through 12/08/2018 with right greater than left lower extremity cellulitis she received IV cefazolin. She was discharged on oral clindamycin which she is completed she left with an order for Santyl. She has been using this daily to the wounds. She admits to lower extremity edema for several years. Past  medical history of a CVA, hypertension. She is not a diabetic. ABI in our clinic was noncompressible 12/26/2018; this patient has wounds on the anterior right lower extremity in a worrisome area posteriorly. She was hospitalized early in January with cellulitis in the right leg as well. We ordered silver collagen last week however home health use silver alginate 01/03/2019 this patient has predominantly venous insufficiency wounds on the right posterior calf right anterior tibial area and right lateral calf. She has 1 area that is closed over. Currently 3 wounds. We have been using silver collagen with Kerlix Coban. She has noncompressible ABIs but her peripheral pulses are palpable 2/7; predominantly venous wounds on the right posterior calf and right anterior tibia and right lateral calf. The right lateral calf is closed. She has a new open area which is small just below the area on the anterior tibia. She states this was not there when home health change the dressing 2 days ago. Most problematic area has been on the posterior calf 2/14; predominantly venous wounds on the right posterior calf right anterior and lateral calf are all closed. She has very fragile skin with large areas of hemosiderin deposition possibly some degree of solar skin damage. We had ordered her juxta lite stockings last week we have made no progress with this 07/07/19-Patient is reestablishing with wound clinic today after suffering a foot injury on 06/26/19 when she dropped narrating board at the independent living center she resides in. Patient went to ER where she was noted to have a 5 cm skin tear on the dorsum of the right foot she was given Keflex, set up with visiting nurses, and initiated on silver collagen dressing to the right foot which she has not been able to use on account of severe pain. Patient has just been using dressing with gauze. She has completed the Keflex course over 7 days. Patient now presents with open  wound  on the right dorsum with a slight hematoma on the right great toe base 8/6-Patient was in for a nurse visit but converted to physician visit on account of increasing swelling, pain, redness in the foot. Patient was dressed with silver collagen by home health patient also felt the silver collagen was causing some irritation and redness by itself Susan [name], Susan [name] ([id number]) [id number] Page 2 of 9 8/10-Patient returns from last week after initiating Augmentin for the left forefoot swelling, inflammation, painful ulcer with eschar, the dressing was changed to Iodoflex since the silver collagen was burning, the foot appears about the same if not slightly better this week. 8/17; readmitted to the clinic 2 weeks ago. This wound is traumatic on the right anterior foot secondary to an ironing board dropping on the area. She had an x- ray that was negative. There may have been secondary infection in this area and Susan [name] added Augmentin. From an infection point of view the area seems better. We have been using silver collagen 8/24; changed her dressing to Iodoflex last week to help with debridement of the wound. She complains of a lot of pain in the tip of her toes. She did not want debridement 8/31; arrives with a much better looking wound today. Has been using Iodoflex I changed her to [facility]. 9/14; continued improvement today wound health surface looks healthy. I changed her to [facility] last time. Surface area is improved 9/21; overall wound area although she does have some epithelialization coming out but is an [place] in the middle of her wound. Still using Hydrofera Blue. Wound areas on the right dorsal foot 10/5; vast majority of this is epithelialized. Much improved from 2 weeks using Hydrofera Blue 10/19; patient's wound is closed on the dorsal right foot. This was initially trauma. This was initially caused by our ironing board  trauma. 04/24/2023 [name] is a 89 year old female with a past medical history of venous insufficiency, essential hypertension, and macular degeneration that presents to the clinic for a 1 week history of wound to her left lower extremity. She states that she hit her leg against a car door. She has been using Vaseline to the area. She currently denies signs of infection. Electronic Signature(s) Signed: 04/24/2023 4:32:32 PM By: Susan Corwin DO Entered By: Susan [name] on 04/24/2023 14:31:10 -------------------------------------------------------------------------------- Physical Exam Details Patient Name: Date of Service: Susan [name], Susan [name] 04/24/2023 1:15 PM Medical Record Number: [id number] Patient Account Number: [id number] Date of Birth/Sex: Treating RN: [name] (89 y.o. F) Primary Care Provider: Alysia [name] Other Clinician: Referring Provider: Treating Provider/Extender: Susan [name] in Treatment: 0 Constitutional respirations regular, non-labored and within target range for patient.. Cardiovascular 2+ dorsalis pedis/posterior tibialis pulses. Psychiatric pleasant and cooperative. Notes Left lower extremity: T the proximal lateral aspect there is an open wound with granulation tissue throughout. No signs of surrounding infection. Hemosiderin o staining throughout the leg. No signs of infection including increased warmth, erythema or purulent drainage. Electronic Signature(s) Signed: 04/24/2023 4:32:32 PM By: Susan Corwin DO Entered By: Susan [name] on 04/24/2023 14:32:07 -------------------------------------------------------------------------------- Physician Orders Details Patient Name: Date of Service: Susan [name], Susan [name] 04/24/2023 1:15 PM Medical Record Number: [id number] Patient Account Number: [id number] Date of Birth/Sex: Treating RN: [name] (89 y.o. [place] Govern Primary Care Provider: Alysia [name] Other  Clinician: Referring Provider: Treating Provider/Extender: Susan [name] in Treatment: 0 Verbal / Phone Orders: No Diagnosis Coding [place], Kathie Rhodes ([id number]) [id number] Page 3 of 9 ICD-10 Coding Code Description [phone] Non-pressure chronic  ulcer of other part of left lower leg with fat layer exposed I87.312 Chronic venous hypertension (idiopathic) with ulcer of left lower extremity T79.8XXA Other early complications of trauma, initial encounter Follow-up Appointments ppointment in 1 week. - Dr Mikey Bussing Return A Anesthetic Wound #6 Left,Lateral Lower Leg (In clinic) Topical Lidocaine 5% applied to wound bed Bathing/ Shower/ Hygiene May shower with protection but do not get wound dressing(s) wet. Protect dressing(s) with water repellant cover (for example, large plastic bag) or a cast cover and may then take shower. Wound Treatment Wound #6 - Lower Leg Wound Laterality: Left, Lateral Cleanser: Soap and Water 1 x Per Week Discharge Instructions: May shower and wash wound with dial antibacterial soap and water prior to dressing change. Topical: Gentamicin 1 x Per Week Discharge Instructions: As directed by physician Topical: Mupirocin Ointment 1 x Per Week Discharge Instructions: Apply Mupirocin (Bactroban) as instructed Prim Dressing: Xeroform Occlusive Gauze Dressing, 4x4 in 1 x Per Week ary Discharge Instructions: Apply to wound bed as instructed Secondary Dressing: T Non-Adherent Dressing, 3x4 in 1 x Per Week elfa Discharge Instructions: Apply over primary dressing as directed. Secured With: Coban Self-Adherent Wrap 4x5 (in/yd) 1 x Per Week Discharge Instructions: Secure with Coban as directed. Secured With: [employer], 4.5x3.1 (in/yd) 1 x Per Week Discharge Instructions: Secure with Kerlix as directed. Patient Medications llergies: Butrans, morphine, codeine A Notifications Medication Indication Start  End 04/24/2023 lidocaine DOSE topical 5 % ointment - ointment topical once daily Electronic Signature(s) Signed: 04/24/2023 4:26:34 PM By: Redmond Pulling RN, BSN Signed: 04/24/2023 4:32:32 PM By: Susan Corwin DO Entered By: Redmond Pulling on 04/24/2023 14:39:25 -------------------------------------------------------------------------------- Problem List Details Patient Name: Date of Service: Susan [name], Susan [name] 04/24/2023 1:15 PM Medical Record Number: [id number] Patient Account Number: [id number] Date of Birth/Sex: Treating RN: [birth year] (89 y.o. F) Primary Care Provider: Alysia [name] Other Clinician: Referring Provider: Treating Provider/Extender: Susan [name] in Treatment: [address] [place], Karenna ([id number]) [id number] Page 4 of 9 ICD-10 Encounter Code Description Active Date MDM Diagnosis [phone] Non-pressure chronic ulcer of other part of left lower leg with fat layer exposed5/21/2024 No Yes I87.312 Chronic venous hypertension (idiopathic) with ulcer of left lower extremity 04/24/2023 No Yes T79.8XXA Other early complications of trauma, initial encounter 04/24/2023 No Yes Inactive Problems Resolved Problems Electronic Signature(s) Signed: 04/24/2023 4:32:32 PM By: Susan Corwin DO Entered By: Susan [name] on 04/24/2023 14:29:16 -------------------------------------------------------------------------------- Progress Note Details Patient Name: Date of Service: Susan [name], Susan [name] 04/24/2023 1:15 PM Medical Record Number: [id number] Patient Account Number: [id number] Date of Birth/Sex: Treating RN: [birth year] (89 y.o. F) Primary Care Provider: Alysia [name] Other Clinician: Referring Provider: Treating Provider/Extender: Susan [name] in Treatment: 0 Subjective Chief Complaint Information obtained from Patient 07/07/19 - Patient is here for wound after injury to Right foot dorsum  when she dropped an ironing board on it 10 days ago 04/24/2023; left lower extremity wound secondary to trauma History of Present Illness (HPI) ADMISSION 12/20/2018 This is a 89 year old woman who lives in the independent part of [facility]. She has had and had a long history of wounds on the right lower extremity times years which apparently have been on and off. She was hospitalized from 12/06/2018 through 12/08/2018 with right greater than left lower extremity cellulitis she received IV cefazolin. She was discharged on oral clindamycin which she is completed she left with an order for Santyl. She has been using this daily to the wounds. She admits to lower extremity edema  for several years. Past medical history of a CVA, hypertension. She is not a diabetic. ABI in our clinic was noncompressible 12/26/2018; this patient has wounds on the anterior right lower extremity in a worrisome area posteriorly. She was hospitalized early in January with cellulitis in the right leg as well. We ordered silver collagen last week however home health use silver alginate 01/03/2019 this patient has predominantly venous insufficiency wounds on the right posterior calf right anterior tibial area and right lateral calf. She has 1 area that is closed over. Currently 3 wounds. We have been using silver collagen with Kerlix Coban. She has noncompressible ABIs but her peripheral pulses are palpable 2/7; predominantly venous wounds on the right posterior calf and right anterior tibia and right lateral calf. The right lateral calf is closed. She has a new open area which is small just below the area on the anterior tibia. She states this was not there when home health change the dressing 2 days ago. Most problematic area has been on the posterior calf 2/14; predominantly venous wounds on the right posterior calf right anterior and lateral calf are all closed. She has very fragile skin with large areas  of hemosiderin deposition possibly some degree of solar skin damage. We had ordered her juxta lite stockings last week we have made no progress with this 07/07/19-Patient is reestablishing with wound clinic today after suffering a foot injury on 06/26/19 when she dropped narrating board at the independent living center she resides in. Patient went to ER where she was noted to have a 5 cm skin tear on the dorsum of the right foot she was given Keflex, set up with visiting nurses, and initiated on silver collagen dressing to the right foot which she has not been able to use on account of severe pain. Patient has just been using dressing with gauze. She has completed the Keflex course over 7 days. Patient now presents with open wound on the right dorsum with a slight hematoma on the right great toe base 8/6-Patient was in for a nurse visit but converted to physician visit on account of increasing swelling, pain, redness in the foot. Patient was dressed with silver collagen by home health patient also felt the silver collagen was causing some irritation and redness by itself Susan [name], Susan [name] ([id number]) [id number] Page 5 of 9 8/10-Patient returns from last week after initiating Augmentin for the left forefoot swelling, inflammation, painful ulcer with eschar, the dressing was changed to Iodoflex since the silver collagen was burning, the foot appears about the same if not slightly better this week. 8/17; readmitted to the clinic 2 weeks ago. This wound is traumatic on the right anterior foot secondary to an ironing board dropping on the area. She had an x- ray that was negative. There may have been secondary infection in this area and Susan [name] added Augmentin. From an infection point of view the area seems better. We have been using silver collagen 8/24; changed her dressing to Iodoflex last week to help with debridement of the wound. She complains of a lot of pain in the tip of  her toes. She did not want debridement 8/31; arrives with a much better looking wound today. Has been using Iodoflex I changed her to [facility]. 9/14; continued improvement today wound health surface looks healthy. I changed her to [facility] last time. Surface area is improved 9/21; overall wound area although she does have some epithelialization coming out but is an [place] in the middle  of her wound. Still using Hydrofera Blue. Wound areas on the right dorsal foot 10/5; vast majority of this is epithelialized. Much improved from 2 weeks using Hydrofera Blue 10/19; patient's wound is closed on the dorsal right foot. This was initially trauma. This was initially caused by our ironing board trauma. 04/24/2023 Ms. Itzayani Guetter is a 89 year old female with a past medical history of venous insufficiency, essential hypertension, and macular degeneration that presents to the clinic for a 1 week history of wound to her left lower extremity. She states that she hit her leg against a car door. She has been using Vaseline to the area. She currently denies signs of infection. Patient History Information obtained from Patient. Allergies Butrans, morphine, codeine Family History Heart Disease - Mother,Father,Maternal Grandparents,Paternal Grandparents, Hypertension - Mother, Stroke - Father,Mother,Maternal Grandparents, No family history of Cancer, Diabetes, Hereditary Spherocytosis, Kidney Disease, Lung Disease, Seizures, Thyroid Problems, Tuberculosis. Social History Never smoker, Marital Status - Widowed, Alcohol Use - Moderate, Drug Use - No History, Caffeine Use - Never. Medical History Eyes Patient has history of Cataracts - removed Denies history of Glaucoma, Optic Neuritis Ear/Nose/Mouth/Throat Denies history of Chronic sinus problems/congestion, Middle ear problems Hematologic/Lymphatic Denies history of Anemia, Hemophilia, Human Immunodeficiency Virus, Lymphedema, Sickle Cell  Disease Respiratory Denies history of Aspiration, Asthma, Chronic Obstructive Pulmonary Disease (COPD), Pneumothorax, Sleep Apnea Cardiovascular Patient has history of Hypertension, Peripheral Venous Disease Denies history of Angina, Arrhythmia, Congestive Heart Failure, Coronary Artery Disease, Deep Vein Thrombosis, Hypotension, Myocardial Infarction, Peripheral Arterial Disease, Phlebitis, Vasculitis Gastrointestinal Denies history of Cirrhosis , Colitis, Crohns, Hepatitis A, Hepatitis B, Hepatitis C Integumentary (Skin) Denies history of History of Burn Musculoskeletal Patient has history of Osteoarthritis Hospitalization/Surgery History - cellulitis. - appendectomy. - cholecystectomy. - tonsillectomy. - CVA 2015. Medical A Surgical History Notes nd Constitutional Symptoms (General Health) stroke 2015 Eyes macular degeneration Gastrointestinal diverticulosis GERD stomach ulcers IBS Genitourinary Kidney stones Immunological Fibromyalgia Integumentary (Skin) skin Ca Psychiatric anxiety Review of Systems (ROS) Eyes Complains or has symptoms of Glasses / Contacts. Objective Constitutional respirations regular, non-labored and within target range for patient.. Vitals Time Taken: 1:50 PM, Height: 61 in, Source: Stated, Weight: 107 lbs, Source: Stated, BMI: 20.2, Temperature: 98.5 F, Pulse: 54 bpm, Respiratory Rate: 18 breaths/min, Blood Pressure: 155/80 mmHg. Susan [name], Susan [name] ([id number]) [id number] Page 6 of 9 Cardiovascular 2+ dorsalis pedis/posterior tibialis pulses. Psychiatric pleasant and cooperative. General Notes: Left lower extremity: T the proximal lateral aspect there is an open wound with granulation tissue throughout. No signs of surrounding infection. o Hemosiderin staining throughout the leg. No signs of infection including increased warmth, erythema or purulent drainage. Integumentary (Hair, Skin) Wound #6 status is Open. Original  cause of wound was Trauma. The date acquired was: 04/17/2023. The wound is located on the Left,Lateral Lower Leg. The wound measures 4.4cm length x 2.9cm width x 0.1cm depth; 10.022cm^2 area and 1.002cm^3 volume. There is Fat Layer (Subcutaneous Tissue) exposed. There is no tunneling or undermining noted. There is a medium amount of serosanguineous drainage noted. There is large (67-100%) red granulation within the wound bed. There is no necrotic tissue within the wound bed. The periwound skin appearance did not exhibit: Callus, Crepitus, Excoriation, Induration, Rash, Scarring, Dry/Scaly, Maceration, Atrophie Blanche, Cyanosis, Ecchymosis, Hemosiderin Staining, Mottled, Pallor, Rubor, Erythema. Assessment Active Problems ICD-10 Non-pressure chronic ulcer of other part of left lower leg with fat layer exposed Chronic venous hypertension (idiopathic) with ulcer of left lower extremity Other early complications of trauma, initial encounter Patient presents with a  1 week history of wound to her left lower extremity secondary to trauma. No signs of infection. She has evidence of venous insufficiency on exam however has very little swelling noted. We discussed using an in office compression wrap versus her changing the dressing daily. She would like to proceed with the compression wrap. We were unable to obtain ABIs due to the small size of her legs. She does have palpable pedal pulses and should have adequate blood flow for healing. At this time I recommended antibiotic ointment with Xeroform under Kerlix/Coban to the left lower extremity. She knows to call with any questions or concerns. She knows to not keep this on for more than 1 week or get this wet. Plan Follow-up Appointments: Return Appointment in 1 week. - Dr Mikey Bussing Anesthetic: Wound #6 Left,Lateral Lower Leg: (In clinic) Topical Lidocaine 5% applied to wound bed Bathing/ Shower/ Hygiene: May shower with protection but do not get wound  dressing(s) wet. Protect dressing(s) with water repellant cover (for example, large plastic bag) or a cast cover and may then take shower. The following medication(s) was prescribed: lidocaine topical 5 % ointment ointment topical once daily was prescribed at facility WOUND #6: - Lower Leg Wound Laterality: Left, Lateral Cleanser: Soap and Water 1 x Per Week/ Discharge Instructions: May shower and wash wound with dial antibacterial soap and water prior to dressing change. Topical: Gentamicin 1 x Per Week/ Discharge Instructions: As directed by physician Topical: Mupirocin Ointment 1 x Per Week/ Discharge Instructions: Apply Mupirocin (Bactroban) as instructed Prim Dressing: Xeroform Occlusive Gauze Dressing, 4x4 in 1 x Per Week/ ary Discharge Instructions: Apply to wound bed as instructed Secondary Dressing: T Non-Adherent Dressing, 3x4 in 1 x Per Week/ elfa Discharge Instructions: Apply over primary dressing as directed. Secured With: Coban Self-Adherent Wrap 4x5 (in/yd) 1 x Per Week/ Discharge Instructions: Secure with Coban as directed. Secured With: [employer], 4.5x3.1 (in/yd) 1 x Per Week/ Discharge Instructions: Secure with Kerlix as directed. 1. Xeroform and antibiotic ointment under Kerlix/Cobanleft lower extremity 2. Follow-up in 1 week Electronic Signature(s) Signed: 06/04/2023 1:50:56 PM By: Pearletha Alfred Signed: 06/04/2023 2:57:08 PM By: Susan Corwin DO Previous Signature: 04/25/2023 6:02:30 PM Version By: Shawn Stall RN, BSN Previous Signature: 04/26/2023 12:56:08 PM Version By: Susan Corwin DO Previous Signature: 04/24/2023 4:32:32 PM Version By: Susan Corwin DO Entered By: Pearletha Alfred on 06/04/2023 13:50:56 Susan [name], Susan [name] ([id number]) [id number] Page 7 of 9 -------------------------------------------------------------------------------- HxROS Details Patient Name: Date of Service: Susan [name], Susan [name] 04/24/2023 1:15 PM Medical  Record Number: [id number] Patient Account Number: [id number] Date of Birth/Sex: Treating RN: [birth year] (89 y.o. Arta Silence Primary Care Provider: Alysia [name] Other Clinician: Referring Provider: Treating Provider/Extender: Susan [name] in Treatment: 0 Information Obtained From Patient Eyes Complaints and Symptoms: Positive for: Glasses / Contacts Medical History: Positive for: Cataracts - removed Negative for: Glaucoma; Optic Neuritis Past Medical History Notes: macular degeneration Constitutional Symptoms (General Health) Medical History: Past Medical History Notes: stroke 2015 Ear/Nose/Mouth/Throat Medical History: Negative for: Chronic sinus problems/congestion; Middle ear problems Hematologic/Lymphatic Medical History: Negative for: Anemia; Hemophilia; Human Immunodeficiency Virus; Lymphedema; Sickle Cell Disease Respiratory Medical History: Negative for: Aspiration; Asthma; Chronic Obstructive Pulmonary Disease (COPD); Pneumothorax; Sleep Apnea Cardiovascular Medical History: Positive for: Hypertension; Peripheral Venous Disease Negative for: Angina; Arrhythmia; Congestive Heart Failure; Coronary Artery Disease; Deep Vein Thrombosis; Hypotension; Myocardial Infarction; Peripheral Arterial Disease; Phlebitis; Vasculitis Gastrointestinal Medical History: Negative for: Cirrhosis ; Colitis; Crohns; Hepatitis A; Hepatitis B; Hepatitis C Past Medical History Notes:  diverticulosis GERD stomach ulcers IBS Genitourinary Medical History: Past Medical History Notes: Kidney stones Immunological Medical History: Past Medical History Notes: Fibromyalgia Susan [name], Susan [name] ([id number]) [id number] Page 8 of 9 Integumentary (Skin) Medical History: Negative for: History of Burn Past Medical History Notes: skin Ca Musculoskeletal Medical History: Positive for: Osteoarthritis Psychiatric Medical History: Past  Medical History Notes: anxiety HBO Extended History Items Eyes: Cataracts Immunizations Pneumococcal Vaccine: Received Pneumococcal Vaccination: Yes Received Pneumococcal Vaccination On or After 60th Birthday: Yes Implantable Devices None Hospitalization / Surgery History Type of Hospitalization/Surgery cellulitis appendectomy cholecystectomy tonsillectomy CVA 2015 Family and Social History Cancer: No; Diabetes: No; Heart Disease: Yes - Mother,Father,Maternal Grandparents,Paternal Grandparents; Hereditary Spherocytosis: No; Hypertension: Yes - Mother; Kidney Disease: No; Lung Disease: No; Seizures: No; Stroke: Yes - Father,Mother,Maternal Grandparents; Thyroid Problems: No; Tuberculosis: No; Never smoker; Marital Status - Widowed; Alcohol Use: Moderate; Drug Use: No History; Caffeine Use: Never; Financial Concerns: No; Food, Clothing or Shelter Needs: No; Support System Lacking: No; Transportation Concerns: No Electronic Signature(s) Signed: 04/24/2023 4:32:32 PM By: Susan Corwin DO Signed: 04/24/2023 5:53:38 PM By: Shawn Stall RN, BSN Signed: 04/27/2023 11:53:23 AM By: Thayer Dallas Previous Signature: 04/24/2023 12:02:08 PM Version By: Susan Corwin DO Entered By: Thayer Dallas on 04/24/2023 14:09:13 -------------------------------------------------------------------------------- SuperBill Details Patient Name: Date of Service: KIMBERLY, FLOW 04/24/2023 Medical Record Number: [id number] Patient Account Number: [id number] Date of Birth/Sex: Treating RN: [birth year] [name] (89 y.o. F) Primary Care Provider: Alysia [name] Other Clinician: Referring Provider: Treating Provider/Extender: Susan [name] in Treatment: 0 Diagnosis Coding ICD-10 Codes Code Description [phone] Non-pressure chronic ulcer of other part of left lower leg with fat layer exposed I87.312 Chronic venous hypertension (idiopathic) with ulcer of left lower extremity Burkman, Amorina  ([id number]) [id number] Page 9 of 9 T79.8XXA Other early complications of trauma, initial encounter Facility Procedures : CPT4 Code: [id number] Description: 99213 - WOUND CARE VISIT-LEV 3 EST PT Modifier: Quantity: 1 Physician Procedures : CPT4 Code Description Modifier [id number] - WC PHYS LEVEL 4 - NEW PT ICD-10 Diagnosis Description L97.822 Non-pressure chronic ulcer of other part of left lower leg with fat layer exposed I87.312 Chronic venous hypertension (idiopathic) with ulcer  of left lower extremity T79.8XXA Other early complications of trauma, initial encounter Quantity: 1 Electronic Signature(s) Signed: 04/24/2023 4:26:34 PM By: Redmond Pulling RN, BSN Signed: 04/24/2023 4:32:32 PM By: Susan Corwin DO Entered By: Redmond Pulling on 04/24/2023 15:37:29

## 2023-06-11 ENCOUNTER — Other Ambulatory Visit: Payer: Self-pay | Admitting: Internal Medicine

## 2023-06-11 ENCOUNTER — Other Ambulatory Visit: Payer: Self-pay | Admitting: Nurse Practitioner

## 2023-06-11 ENCOUNTER — Other Ambulatory Visit: Payer: Self-pay | Admitting: Family Medicine

## 2023-06-11 DIAGNOSIS — K529 Noninfective gastroenteritis and colitis, unspecified: Secondary | ICD-10-CM

## 2023-06-25 ENCOUNTER — Other Ambulatory Visit: Payer: Self-pay | Admitting: Nurse Practitioner

## 2023-06-25 ENCOUNTER — Other Ambulatory Visit: Payer: Self-pay | Admitting: Internal Medicine

## 2023-06-25 DIAGNOSIS — K2100 Gastro-esophageal reflux disease with esophagitis, without bleeding: Secondary | ICD-10-CM

## 2023-06-25 NOTE — Telephone Encounter (Signed)
Chart supports rx. Last OV: 05/25/2023 Next OV: 07/09/2023

## 2023-07-05 NOTE — Progress Notes (Signed)
[name] [address] [place] [address] Phone: [phone] Subjective:   INadine Counts, am serving as a scribe for Dr. Antoine Primas.   I'm seeing this patient by the request  of:  Nche, Bonna Gains, NP  CC: Knee pain, bilateral  VHQ:[id number]  04/09/2023 Patient is ambulatory but uses the aid of a walker. Discussed with patient to maybe keep these injections to every 3 to 6 months. Patient would like to be active and feels like it is helpful for this. Follow-up with me again in 3 months. Continue to be active in the house with home exercises.   Updated 07/11/2023 Susan [name] is a 89 y.o. female coming in with complaint of L knee pain.  Patient has had more pain in the right knee as well.  Wondering if it is more compensation at the moment.       Past Medical History:  Diagnosis Date   Actinic keratosis, hx of    chest wall (2012)   Allergic rhinitis    Anxiety    Arthritis    "spine, hips" (01/30/2017)   Bursitis    Cellulitis of right foot 01/30/2017   Colon polyps    Diverticulosis    Esophageal dysmotilities 10/16/2017   Fibromyalgia    "qwhere" (01/30/2017)   Gallstones    GERD (gastroesophageal reflux disease)    High cholesterol    History of kidney stones    History of stomach ulcers    Hx of squamous cell carcinoma excision    right mandible (2011), right arm (2014)   Hypertension    IBS (irritable bowel syndrome)    Lymphangitis 12/06/2018   Osteoarthritis    Pneumonia 2000s X 2   "twice" (01/30/2017)   Pyloric stenosis    Stroke (HCC) 10/2014   denies residual on 01/30/2017   Past Surgical History:  Procedure Laterality Date   APPENDECTOMY     CARDIOVASCULAR STRESS TEST  04/2016   nuclear cardiolite stress test done in [place] (no ischemia, no LVH, no LV systolic function)   CATARACT EXTRACTION W/ INTRAOCULAR LENS  IMPLANT, BILATERAL Bilateral    CHOLECYSTECTOMY OPEN     COLONOSCOPY  2009, 2014    DIAGNOSTIC MAMMOGRAM  2009, 2012   TONSILLECTOMY     Social History   Socioeconomic History   Marital status: Widowed    Spouse name: Not on file   Number of children: 4   Years of education: Not on file   Highest education level: Not on file  Occupational History   Occupation: retired  Tobacco Use   Smoking status: Never   Smokeless tobacco: Never  Vaping Use   Vaping status: Never Used  Substance and Sexual Activity   Alcohol use: Not Currently    Comment: glass of red wine maybe once week   Drug use: No   Sexual activity: Not on file  Other Topics Concern   Not on file  Social History Narrative   Not on file   Social Determinants of Health   Financial Resource Strain: Low Risk  (05/25/2023)   Overall Financial Resource Strain (CARDIA)    Difficulty of Paying Living Expenses: Not hard at all  Food Insecurity: No Food Insecurity (05/25/2023)   Hunger Vital Sign    Worried About Running Out of Food in the Last Year: Never true    Ran Out of Food in the Last Year: Never true  Transportation Needs: No Transportation Needs (05/25/2023)  PRAPARE - Administrator, Civil Service (Medical): No    Lack of Transportation (Non-Medical): No  Physical Activity: Inactive (05/25/2023)   Exercise Vital Sign    Days of Exercise per Week: 0 days    Minutes of Exercise per Session: 0 min  Stress: No Stress Concern Present (05/25/2023)   [employer] of Occupational Health - Occupational Stress Questionnaire    Feeling of Stress : Only a little  Social Connections: Patient Declined (05/25/2023)   Social Connection and Isolation Panel [NHANES]    Frequency of Communication with Friends and Family: Patient declined    Frequency of Social Gatherings with Friends and Family: Patient declined    Attends Religious Services: Patient declined    Database administrator or Organizations: Patient declined    Attends Engineer, structural: Patient declined    Marital  Status: Patient declined   Allergies  Allergen Reactions   Butrans [Buprenorphine] Other (See Comments)    Severe chest pains   Codeine Other (See Comments)    Severe Chest pains  Other Reaction(s): severe chest pain   Morphine And Codeine Other (See Comments)    Severe chest pains   Morphine Sulfate     Other Reaction(s): severe chest pain    Family History  Problem Relation Age of Onset   Stroke Mother    Heart attack Mother    Stroke Father    Heart attack Father    Heart attack Maternal Grandmother    Stroke Maternal Grandmother    Stroke Paternal Grandmother    Heart attack Paternal Grandmother    Heart attack Maternal Grandfather    Stroke Maternal Grandfather    Stroke Paternal Grandfather    Heart attack Paternal Grandfather    Colon cancer Neg Hx    Pancreatic cancer Neg Hx    Stomach cancer Neg Hx    Esophageal cancer Neg Hx      Current Outpatient Medications (Cardiovascular):    cholestyramine (QUESTRAN) 4 g packet, DISSOLVE & TAKE 1 PACKET BY MOUTH TWICE DAILY   furosemide (LASIX) 20 MG tablet, Take 1 tablet by mouth once daily   losartan (COZAAR) 50 MG tablet, Take 2 tablets (100 mg total) by mouth daily.   metoprolol succinate (TOPROL-XL) 25 MG 24 hr tablet, Take 1 tablet (25 mg total) by mouth daily.  Current Outpatient Medications (Respiratory):    fexofenadine (ALLEGRA) 180 MG tablet, Take 180 mg by mouth daily.   fluticasone (FLONASE) 50 MCG/ACT nasal spray, Use 1 spray(s) in each nostril twice daily   sodium chloride (OCEAN) 0.65 % SOLN nasal spray, Place 1 spray into both nostrils as needed for congestion.  Current Outpatient Medications (Analgesics):    acetaminophen (TYLENOL) 650 MG CR tablet, Take 650 mg by mouth every 8 (eight) hours as needed for pain.   aspirin EC 325 MG tablet, Take 325 mg by mouth daily.  Current Outpatient Medications (Hematological):    IRON PO, Take 65 mg by mouth 3 (three) times a week.  Current Outpatient  Medications (Other):    ALPRAZolam (XANAX) 0.5 MG tablet, Take 1 tablet (0.5 mg total) by mouth daily as needed for anxiety.   dicyclomine (BENTYL) 10 MG capsule, Take 1 capsule by mouth twice daily   diphenoxylate-atropine (LOMOTIL) 2.5-0.025 MG tablet, TAKE 1 TABLET BY MOUTH TWICE DAILY AS NEEDED FOR DIARRHEA OR LOOSE STOOLS   DULoxetine (CYMBALTA) 20 MG capsule, Take 1 capsule by mouth once daily   famotidine (PEPCID) 20  MG tablet, Take 1 tablet by mouth once daily   mupirocin ointment (BACTROBAN) 2 %, Apply 1 Application topically 2 (two) times daily. Apply with dressing change   potassium chloride SA (KLOR-CON M) 20 MEQ tablet, Take 1 tablet (20 mEq total) by mouth daily.   Vitamin D, Ergocalciferol, (DRISDOL) 1.25 MG (50000 UNIT) CAPS capsule, Take 1 capsule by mouth once a week   Reviewed prior external information including notes and imaging from  primary care provider As well as notes that were available from care everywhere and other healthcare systems.  Past medical history, social, surgical and family history all reviewed in electronic medical record.  No pertanent information unless stated regarding to the chief complaint.   Review of Systems:  No headache, visual changes, nausea, vomiting, diarrhea, constipation, dizziness, abdominal pain, skin rash, fevers, chills, night sweats, weight loss, swollen lymph nodes, body aches, joint swelling, chest pain, shortness of breath, mood changes. POSITIVE muscle aches  Objective  Blood pressure (!) 130/90, pulse 90, height 5' (1.524 m), weight 106 lb (48.1 kg), SpO2 96%.   General: No apparent distress alert and oriented x3 mood and affect normal, dressed appropriately.  HEENT: Pupils equal, extraocular movements intact  Respiratory: Patient's speak in full sentences and does not appear short of breath  Cardiovascular: No lower extremity edema, non tender, no erythema   Patient's knees do have crepitus noted.  Patient does have  tenderness to palpation noted.  Instability with valgus and varus force.  After informed written and verbal consent, patient was seated on exam table. Right knee was prepped with alcohol swab and utilizing anterolateral approach, patient's right knee space was injected with 4:1  marcaine 0.5%: Kenalog 40mg /dL. Patient tolerated the procedure well without immediate complications.  After informed written and verbal consent, patient was seated on exam table. Left knee was prepped with alcohol swab and utilizing anterolateral approach, patient's left knee space was injected with 4:1  marcaine 0.5%: Kenalog 40mg /dL. Patient tolerated the procedure well without immediate complications.   Impression and Recommendations:    The above documentation has been reviewed and is accurate and complete Judi Saa, DO

## 2023-07-09 ENCOUNTER — Ambulatory Visit: Payer: Medicare Other | Admitting: Nurse Practitioner

## 2023-07-09 ENCOUNTER — Encounter: Payer: Self-pay | Admitting: Nurse Practitioner

## 2023-07-09 VITALS — BP 120/76 | HR 61 | Temp 97.9°F | Ht 60.0 in | Wt 108.4 lb

## 2023-07-09 DIAGNOSIS — I10 Essential (primary) hypertension: Secondary | ICD-10-CM | POA: Diagnosis not present

## 2023-07-09 DIAGNOSIS — I872 Venous insufficiency (chronic) (peripheral): Secondary | ICD-10-CM | POA: Diagnosis not present

## 2023-07-09 DIAGNOSIS — D0460 Carcinoma in situ of skin of unspecified upper limb, including shoulder: Secondary | ICD-10-CM | POA: Insufficient documentation

## 2023-07-09 DIAGNOSIS — E782 Mixed hyperlipidemia: Secondary | ICD-10-CM | POA: Diagnosis not present

## 2023-07-09 DIAGNOSIS — M3500 Sicca syndrome, unspecified: Secondary | ICD-10-CM

## 2023-07-09 DIAGNOSIS — G9332 Myalgic encephalomyelitis/chronic fatigue syndrome: Secondary | ICD-10-CM | POA: Insufficient documentation

## 2023-07-09 DIAGNOSIS — D492 Neoplasm of unspecified behavior of bone, soft tissue, and skin: Secondary | ICD-10-CM

## 2023-07-09 MED ORDER — LOSARTAN POTASSIUM 50 MG PO TABS
100.0000 mg | ORAL_TABLET | Freq: Every day | ORAL | 2 refills | Status: DC
Start: 2023-07-09 — End: 2024-01-07

## 2023-07-09 MED ORDER — POTASSIUM CHLORIDE CRYS ER 20 MEQ PO TBCR
20.0000 meq | EXTENDED_RELEASE_TABLET | Freq: Every day | ORAL | 1 refills | Status: DC
Start: 2023-07-09 — End: 2024-01-23

## 2023-07-09 MED ORDER — MUPIROCIN 2 % EX OINT
1.0000 | TOPICAL_OINTMENT | Freq: Two times a day (BID) | CUTANEOUS | 0 refills | Status: DC
Start: 2023-07-09 — End: 2024-01-07

## 2023-07-09 MED ORDER — METOPROLOL SUCCINATE ER 25 MG PO TB24: 25.0000 mg | ORAL_TABLET | Freq: Every day | ORAL | 3 refills | Status: DC

## 2023-07-09 NOTE — Assessment & Plan Note (Signed)
Reports only dry eyes Use of refresh eye drops daily

## 2023-07-09 NOTE — Assessment & Plan Note (Signed)
BP at goal with losartan and metoprolol BP Readings from Last 3 Encounters:  07/09/23 120/76  04/17/23 (!) 161/74  04/09/23 124/76    Maintain med dose

## 2023-07-09 NOTE — Assessment & Plan Note (Addendum)
LDL at goal Current use of questran due to IBS-D

## 2023-07-09 NOTE — Assessment & Plan Note (Signed)
Improved Use of compression stocking and furosemide prn

## 2023-07-09 NOTE — Progress Notes (Signed)
Established Patient Visit  Patient: Susan [name]   DOB: 1924-06-11   89 y.o. Female  MRN: [id number] Visit Date: 07/13/2023  Subjective:    Chief Complaint  Patient presents with   Hypertension   Hyperlipidemia    Not fasting    Otalgia  There is pain in the left ear. This is a new problem. The current episode started 1 to 4 weeks ago. The problem occurs constantly. The problem has been unchanged. There has been no fever. The pain is mild. Associated symptoms include a rash. Pertinent negatives include no abdominal pain, coughing, diarrhea, ear discharge, headaches, neck pain, rhinorrhea, sore throat or vomiting. She has tried nothing for the symptoms. Her past medical history is significant for hearing loss. There is no history of a chronic ear infection or a tympanostomy tube.    Sicca syndrome (HCC) Reports only dry eyes Use of refresh eye drops daily  Essential hypertension BP at goal with losartan and metoprolol BP Readings from Last 3 Encounters:  07/09/23 120/76  04/17/23 (!) 161/74  04/09/23 124/76    Maintain med dose  Hyperlipidemia LDL at goal Current use of questran due to IBS-D  Venous insufficiency of both lower extremities Improved Use of compression stocking and furosemide prn  Wt Readings from Last 3 Encounters:  07/11/23 106 lb (48.1 kg)  07/09/23 108 lb 6.4 oz (49.2 kg)  05/25/23 107 lb (48.5 kg)                  Reviewed medical, surgical, and social history today  Medications: Outpatient Medications Prior to Visit  Medication Sig   acetaminophen (TYLENOL) 650 MG CR tablet Take 650 mg by mouth every 8 (eight) hours as needed for pain.   ALPRAZolam (XANAX) 0.5 MG tablet Take 1 tablet (0.5 mg total) by mouth daily as needed for anxiety.   aspirin EC 325 MG tablet Take 325 mg by mouth daily.   cholestyramine (QUESTRAN) 4 g packet DISSOLVE & TAKE 1 PACKET BY MOUTH TWICE DAILY   dicyclomine (BENTYL) 10 MG capsule Take 1 capsule by mouth  twice daily   diphenoxylate-atropine (LOMOTIL) 2.5-0.025 MG tablet TAKE 1 TABLET BY MOUTH TWICE DAILY AS NEEDED FOR DIARRHEA OR LOOSE STOOLS   DULoxetine (CYMBALTA) 20 MG capsule Take 1 capsule by mouth once daily   famotidine (PEPCID) 20 MG tablet Take 1 tablet by mouth once daily   fexofenadine (ALLEGRA) 180 MG tablet Take 180 mg by mouth daily.   fluticasone (FLONASE) 50 MCG/ACT nasal spray Use 1 spray(s) in each nostril twice daily   furosemide (LASIX) 20 MG tablet Take 1 tablet by mouth once daily   IRON PO Take 65 mg by mouth 3 (three) times a week.   sodium chloride (OCEAN) 0.65 % SOLN nasal spray Place 1 spray into both nostrils as needed for congestion.   Vitamin D, Ergocalciferol, (DRISDOL) 1.25 MG (50000 UNIT) CAPS capsule Take 1 capsule by mouth once a week   [DISCONTINUED] losartan (COZAAR) 50 MG tablet Take 2 tablets (100 mg total) by mouth daily.   [DISCONTINUED] metoprolol succinate (TOPROL-XL) 25 MG 24 hr tablet Take 1 tablet (25 mg total) by mouth daily.   [DISCONTINUED] potassium chloride SA (KLOR-CON M) 20 MEQ tablet Take 1 tablet (20 mEq total) by mouth daily.   No facility-administered medications prior to visit.   Reviewed past medical and social history.   ROS per HPI above  Objective:  BP 120/76   Pulse 61   Temp 97.9 F (36.6 C)   Ht 5' (1.524 m)   Wt 108 lb 6.4 oz (49.2 kg)   SpO2 96%   BMI 21.17 kg/m      Physical Exam HENT:     Right Ear: Tympanic membrane, ear canal and external ear normal.     Left Ear: Tympanic membrane and ear canal normal.     Ears:      Comments: Scaly papule with surrounding erythema, no swelling, no induration Cardiovascular:     Rate and Rhythm: Normal rate and regular rhythm.     Pulses: Normal pulses.     Heart sounds: Murmur heard.  Pulmonary:     Effort: Pulmonary effort is normal.     Breath sounds: Normal breath sounds.  Neurological:     Mental Status: She is alert and oriented to person, place, and  time.  Psychiatric:        Mood and Affect: Mood normal.        Behavior: Behavior normal.        Thought Content: Thought content normal.     No results found for any visits on 07/09/23.    Assessment & Plan:    Problem List Items Addressed This Visit       Cardiovascular and Mediastinum   Essential hypertension    BP at goal with losartan and metoprolol BP Readings from Last 3 Encounters:  07/09/23 120/76  04/17/23 (!) 161/74  04/09/23 124/76    Maintain med dose      Relevant Medications   losartan (COZAAR) 50 MG tablet   metoprolol succinate (TOPROL-XL) 25 MG 24 hr tablet   Venous insufficiency of both lower extremities    Improved Use of compression stocking and furosemide prn      Relevant Medications   losartan (COZAAR) 50 MG tablet   metoprolol succinate (TOPROL-XL) 25 MG 24 hr tablet   potassium chloride SA (KLOR-CON M) 20 MEQ tablet     Digestive   Sicca syndrome (HCC)    Reports only dry eyes Use of refresh eye drops daily        Other   Hyperlipidemia - Primary    LDL at goal Current use of questran due to IBS-D      Relevant Medications   losartan (COZAAR) 50 MG tablet   metoprolol succinate (TOPROL-XL) 25 MG 24 hr tablet   Other Visit Diagnoses     Atypical squamoproliferative skin lesion       Relevant Medications   mupirocin ointment (BACTROBAN) 2 %   Other Relevant Orders   Ambulatory referral to Dermatology      Return in about 6 months (around 01/09/2024) for HTN, hyperlipidemia (fasting).     Alysia Penna, NP

## 2023-07-09 NOTE — Patient Instructions (Signed)
Call office if you do not have dermatology appointment within the next 2weeks. Use bactroban ointment BID x 1week. Maintain other med doses

## 2023-07-11 ENCOUNTER — Ambulatory Visit (INDEPENDENT_AMBULATORY_CARE_PROVIDER_SITE_OTHER): Payer: Medicare Other | Admitting: Family Medicine

## 2023-07-11 ENCOUNTER — Encounter: Payer: Self-pay | Admitting: Family Medicine

## 2023-07-11 VITALS — BP 130/90 | HR 90 | Ht 60.0 in | Wt 106.0 lb

## 2023-07-11 DIAGNOSIS — M170 Bilateral primary osteoarthritis of knee: Secondary | ICD-10-CM | POA: Diagnosis not present

## 2023-07-11 NOTE — Assessment & Plan Note (Signed)
Lateral injections given today, tolerated the procedure well, discussed icing regimen and home exercises, discussed which activities to do and which ones to avoid.  Increase activity slowly.  Follow-up with me again in 3 months otherwise.  Due to patient's age and comorbidities is not a surgical candidate.  Patient does ambulate with the aid of a walker and social determinant of health as patient is not significantly active.

## 2023-07-11 NOTE — Patient Instructions (Addendum)
Injection in knees today Good to see you! If you want a second opinion for Urology you can go to [facility] but talk to your daughters first See you again in 2 months

## 2023-07-13 ENCOUNTER — Other Ambulatory Visit: Payer: Self-pay

## 2023-07-13 ENCOUNTER — Encounter: Payer: Self-pay | Admitting: Family Medicine

## 2023-07-13 DIAGNOSIS — R32 Unspecified urinary incontinence: Secondary | ICD-10-CM

## 2023-07-19 ENCOUNTER — Other Ambulatory Visit: Payer: Self-pay

## 2023-07-19 ENCOUNTER — Inpatient Hospital Stay (HOSPITAL_COMMUNITY)
Admission: EM | Admit: 2023-07-19 | Discharge: 2023-07-22 | DRG: 603 | Disposition: A | Discharge status: Home / Self Care | Payer: Medicare Other | Source: Skilled Nursing Facility | Attending: Family Medicine | Admitting: Family Medicine

## 2023-07-19 ENCOUNTER — Encounter (HOSPITAL_COMMUNITY): Payer: Self-pay

## 2023-07-19 DIAGNOSIS — M797 Fibromyalgia: Secondary | ICD-10-CM | POA: Diagnosis present

## 2023-07-19 DIAGNOSIS — E871 Hypo-osmolality and hyponatremia: Secondary | ICD-10-CM | POA: Diagnosis present

## 2023-07-19 DIAGNOSIS — Z885 Allergy status to narcotic agent status: Secondary | ICD-10-CM | POA: Diagnosis not present

## 2023-07-19 DIAGNOSIS — K529 Noninfective gastroenteritis and colitis, unspecified: Secondary | ICD-10-CM | POA: Diagnosis present

## 2023-07-19 DIAGNOSIS — F419 Anxiety disorder, unspecified: Secondary | ICD-10-CM | POA: Diagnosis present

## 2023-07-19 DIAGNOSIS — M7989 Other specified soft tissue disorders: Secondary | ICD-10-CM | POA: Diagnosis present

## 2023-07-19 DIAGNOSIS — E7800 Pure hypercholesterolemia, unspecified: Secondary | ICD-10-CM | POA: Diagnosis present

## 2023-07-19 DIAGNOSIS — Z888 Allergy status to other drugs, medicaments and biological substances status: Secondary | ICD-10-CM | POA: Diagnosis not present

## 2023-07-19 DIAGNOSIS — L03115 Cellulitis of right lower limb: Secondary | ICD-10-CM | POA: Diagnosis not present

## 2023-07-19 DIAGNOSIS — Z66 Do not resuscitate: Secondary | ICD-10-CM | POA: Diagnosis present

## 2023-07-19 DIAGNOSIS — K279 Peptic ulcer, site unspecified, unspecified as acute or chronic, without hemorrhage or perforation: Secondary | ICD-10-CM | POA: Diagnosis present

## 2023-07-19 DIAGNOSIS — Z823 Family history of stroke: Secondary | ICD-10-CM | POA: Diagnosis not present

## 2023-07-19 DIAGNOSIS — Z79899 Other long term (current) drug therapy: Secondary | ICD-10-CM

## 2023-07-19 DIAGNOSIS — Z8249 Family history of ischemic heart disease and other diseases of the circulatory system: Secondary | ICD-10-CM | POA: Diagnosis not present

## 2023-07-19 DIAGNOSIS — F05 Delirium due to known physiological condition: Secondary | ICD-10-CM | POA: Diagnosis present

## 2023-07-19 DIAGNOSIS — Z8673 Personal history of transient ischemic attack (TIA), and cerebral infarction without residual deficits: Secondary | ICD-10-CM | POA: Diagnosis not present

## 2023-07-19 DIAGNOSIS — Z8616 Personal history of COVID-19: Secondary | ICD-10-CM | POA: Diagnosis not present

## 2023-07-19 DIAGNOSIS — L0390 Cellulitis, unspecified: Secondary | ICD-10-CM | POA: Diagnosis present

## 2023-07-19 DIAGNOSIS — R609 Edema, unspecified: Secondary | ICD-10-CM | POA: Diagnosis not present

## 2023-07-19 DIAGNOSIS — K219 Gastro-esophageal reflux disease without esophagitis: Secondary | ICD-10-CM | POA: Diagnosis present

## 2023-07-19 DIAGNOSIS — I10 Essential (primary) hypertension: Secondary | ICD-10-CM | POA: Diagnosis not present

## 2023-07-19 DIAGNOSIS — Z7982 Long term (current) use of aspirin: Secondary | ICD-10-CM

## 2023-07-19 DIAGNOSIS — M3500 Sicca syndrome, unspecified: Secondary | ICD-10-CM | POA: Diagnosis present

## 2023-07-19 DIAGNOSIS — Z86010 Personal history of colonic polyps: Secondary | ICD-10-CM

## 2023-07-19 DIAGNOSIS — M79661 Pain in right lower leg: Secondary | ICD-10-CM | POA: Diagnosis not present

## 2023-07-19 DIAGNOSIS — I872 Venous insufficiency (chronic) (peripheral): Secondary | ICD-10-CM | POA: Diagnosis present

## 2023-07-19 DIAGNOSIS — R011 Cardiac murmur, unspecified: Secondary | ICD-10-CM | POA: Diagnosis present

## 2023-07-19 DIAGNOSIS — R410 Disorientation, unspecified: Secondary | ICD-10-CM | POA: Diagnosis not present

## 2023-07-19 DIAGNOSIS — R7309 Other abnormal glucose: Secondary | ICD-10-CM

## 2023-07-19 DIAGNOSIS — M79606 Pain in leg, unspecified: Secondary | ICD-10-CM | POA: Diagnosis not present

## 2023-07-19 LAB — CBC WITH DIFFERENTIAL/PLATELET
Abs Immature Granulocytes: 0.27 10*3/uL — ABNORMAL HIGH (ref 0.00–0.07)
Basophils Absolute: 0 10*3/uL (ref 0.0–0.1)
Basophils Relative: 0 %
Eosinophils Absolute: 0 10*3/uL (ref 0.0–0.5)
Eosinophils Relative: 0 %
HCT: 41.3 % (ref 36.0–46.0)
Hemoglobin: 13.5 g/dL (ref 12.0–15.0)
Immature Granulocytes: 2 %
Lymphocytes Relative: 7 %
Lymphs Abs: 1.2 10*3/uL (ref 0.7–4.0)
MCH: 30.4 pg (ref 26.0–34.0)
MCHC: 32.7 g/dL (ref 30.0–36.0)
MCV: 93 fL (ref 80.0–100.0)
Monocytes Absolute: 1 10*3/uL (ref 0.1–1.0)
Monocytes Relative: 6 %
Neutro Abs: 13.3 10*3/uL — ABNORMAL HIGH (ref 1.7–7.7)
Neutrophils Relative %: 85 %
Platelets: 219 10*3/uL (ref 150–400)
RBC: 4.44 MIL/uL (ref 3.87–5.11)
RDW: 13.7 % (ref 11.5–15.5)
WBC: 15.8 10*3/uL — ABNORMAL HIGH (ref 4.0–10.5)
nRBC: 0 % (ref 0.0–0.2)

## 2023-07-19 LAB — BASIC METABOLIC PANEL
Anion gap: 15 (ref 5–15)
BUN: 15 mg/dL (ref 8–23)
CO2: 20 mmol/L — ABNORMAL LOW (ref 22–32)
Calcium: 8.3 mg/dL — ABNORMAL LOW (ref 8.9–10.3)
Chloride: 99 mmol/L (ref 98–111)
Creatinine, Ser: 0.73 mg/dL (ref 0.44–1.00)
GFR, Estimated: >60 mL/min (ref >60)
Glucose, Bld: 152 mg/dL — ABNORMAL HIGH (ref 70–99)
Potassium: 3.6 mmol/L (ref 3.5–5.1)
Sodium: 134 mmol/L — ABNORMAL LOW (ref 135–145)

## 2023-07-19 NOTE — ED Provider Triage Note (Signed)
Emergency Medicine Provider Triage Evaluation Note  Susan [name] , a 89 y.o. female  was evaluated in triage.  Pt complains of right lower extremity redness, swelling, and pain.  This started yesterday.  Patient denies fever, chills, chest pain, shortness of breath, trauma to the area.  Area is painful when she walks on it.  Review of Systems  Positive:  Negative: See above   Physical Exam  BP (!) 127/54 (BP Location: Left Arm)   Pulse 80   Temp 98.5 F (36.9 C) (Oral)   Resp 16   Ht 5' (1.524 m)   Wt 48.1 kg   SpO2 96%   BMI 20.70 kg/m  Gen:   Awake, no distress   Resp:  Normal effort  MSK:   Moves extremities without difficulty  Other:  Right lower extremity is red and slightly warm to the touch primarily on the dorsal aspect of the right foot.  There is some redness that extends up into the calf.  No specific calf tenderness.  Medical Decision Making  Medically screening exam initiated at 7:25 PM.  Appropriate orders placed.  Susan [name] was informed that the remainder of the evaluation will be completed by another provider, this initial triage assessment does not replace that evaluation, and the importance of remaining in the ED until their evaluation is complete.  Basic labs ordered in addition to DVT study.  DVT may not be able to be completed today as vascular tech shift has ended at 7.   Susan [name], [place] 07/19/23 1924

## 2023-07-19 NOTE — ED Provider Notes (Incomplete)
[place] [facility]GENCY DE[facility]MENT AT [facility] Provider Note   CSN: [id number] Arrival date & time: 07/19/23  1837     History {Add pertinent medical, surgical, social history, OB history to HPI:1} Chief Complaint  Patient presents with  . Leg Swelling    Elonda Krut is a 89 y.o. female.  The history is provided by the patient.  She has history of hypertension, hyperlipidemia, stroke   Home Medications Prior to Admission medications   Medication Sig Start Date End Date Taking? Authorizing Provider  acetaminophen (TYLENOL) 650 MG CR tablet Take 650 mg by mouth every 8 (eight) hours as needed for pain.    [provider]  ALPRAZolam Prudy Feeler) 0.5 MG tablet Take 1 tablet (0.5 mg total) by mouth daily as needed for anxiety. 03/27/23   Nche, Bonna Gains, NP  aspirin EC 325 MG tablet Take 325 mg by mouth daily.    [provider]  cholestyramine (QUESTRAN) 4 g packet DISSOLVE & TAKE 1 PACKET BY MOUTH TWICE DAILY 06/12/23   Hilarie Fredrickson, MD  dicyclomine (BENTYL) 10 MG capsule Take 1 capsule by mouth twice daily 06/14/23   Nche, Bonna Gains, NP  diphenoxylate-atropine (LOMOTIL) 2.5-0.025 MG tablet TAKE 1 TABLET BY MOUTH TWICE DAILY AS NEEDED FOR DIARRHEA OR LOOSE STOOLS 06/26/23   Hilarie Fredrickson, MD  DULoxetine (CYMBALTA) 20 MG capsule Take 1 capsule by mouth once daily 03/12/23   Judi Saa, DO  famotidine (PEPCID) 20 MG tablet Take 1 tablet by mouth once daily 06/25/23   Nche, Bonna Gains, NP  fexofenadine (ALLEGRA) 180 MG tablet Take 180 mg by mouth daily.    [provider]  fluticasone (FLONASE) 50 MCG/ACT nasal spray Use 1 spray(s) in each nostril twice daily 01/02/23   Nche, Bonna Gains, NP  furosemide (LASIX) 20 MG tablet Take 1 tablet by mouth once daily 05/24/23   Nche, Bonna Gains, NP  IRON PO Take 65 mg by mouth 3 (three) times a week.    [provider]  losartan (COZAAR) 50 MG tablet Take 2 tablets (100 mg total) by mouth  daily. 07/09/23   Nche, Bonna Gains, NP  metoprolol succinate (TOPROL-XL) 25 MG 24 hr tablet Take 1 tablet (25 mg total) by mouth daily. 07/09/23   Nche, Bonna Gains, NP  mupirocin ointment (BACTROBAN) 2 % Apply 1 Application topically 2 (two) times daily. Apply with dressing change 07/09/23   Nche, Bonna Gains, NP  potassium chloride SA (KLOR-CON M) 20 MEQ tablet Take 1 tablet (20 mEq total) by mouth daily. 07/09/23   Nche, Bonna Gains, NP  sodium chloride (OCEAN) 0.65 % SOLN nasal spray Place 1 spray into both nostrils as needed for congestion. 12/28/21   Nche, Bonna Gains, NP  Vitamin D, Ergocalciferol, (DRISDOL) 1.25 MG (50000 UNIT) CAPS capsule Take 1 capsule by mouth once a week 06/11/23   Judi Saa, DO      Allergies    Butrans [buprenorphine], Codeine, Morphine and codeine, and Morphine sulfate    Review of Systems   Review of Systems  All other systems reviewed and are negative.   Physical Exam Updated Vital Signs BP 114/72 (BP Location: Left Arm)   Pulse 79   Temp 99.2 F (37.3 C)   Resp 20   Ht 5' (1.524 m)   Wt 48.1 kg   SpO2 95%   BMI 20.70 kg/m  Physical Exam Vitals and nursing note reviewed.   89 year old female, resting comfortably  and in no acute distress. Vital signs are ***. Oxygen saturation is ***%, which is normal. Head is normocephalic and atraumatic. PERRLA, EOMI. Oropharynx is clear. Neck is nontender and supple without adenopathy or JVD. Back is nontender and there is no CVA tenderness. Lungs are clear without rales, wheezes, or rhonchi. Chest is nontender. Heart has regular rate and rhythm without murmur. Abdomen is soft, flat, nontender without masses or hepatosplenomegaly and peristalsis is normoactive. Extremities have no cyanosis or edema, full range of motion is present. Skin is warm and dry without rash. Neurologic: Mental status is normal, cranial nerves are intact, there are no motor or sensory deficits.  ED Results / Procedures /  Treatments   Labs (all labs ordered are listed, but only abnormal results are displayed) Labs Reviewed  CBC WITH DIFFERENTIAL/PLATELET - Abnormal; Notable for the following components:      Result Value   WBC 15.8 (*)    Neutro Abs 13.3 (*)    Abs Immature Granulocytes 0.27 (*)    All other components within normal limits  BASIC METABOLIC PANEL - Abnormal; Notable for the following components:   Sodium 134 (*)    CO2 20 (*)    Glucose, Bld 152 (*)    Calcium 8.3 (*)    All other components within normal limits    EKG None  Radiology No results found.  Procedures Procedures  {Document cardiac monitor, telemetry assessment procedure when appropriate:1}  Medications Ordered in ED Medications - No data to display  ED Course/ Medical Decision Making/ A&P   {   Click here for ABCD2, HEART and other calculatorsREFRESH Note before signing :1}                              Medical Decision Making  ***  {Document critical care time when appropriate:1} {Document review of labs and clinical decision tools ie heart score, Chads2Vasc2 etc:1}  {Document your independent review of radiology images, and any outside records:1} {Document your discussion with family members, caretakers, and with consultants:1} {Document social determinants of health affecting pt's care:1} {Document your decision making why or why not admission, treatments were needed:1} Final Clinical Impression(s) / ED Diagnoses Final diagnoses:  None    Rx / DC Orders ED Discharge Orders     None

## 2023-07-19 NOTE — ED Triage Notes (Signed)
PER EMS: pt arrives [facility] assisted living with c/o right leg and foot swelling and redness and painful to the touch. Denies injury. + pedal pulse with use of doppler  127/60, HR-80, 98% RA

## 2023-07-19 NOTE — ED Provider Notes (Signed)
[name]   CSN: [id number] Arrival date & time: 07/19/23  1837     History  Chief Complaint  Patient presents with   Leg Swelling    Susan [name] is a 89 y.o. female.  The history is provided by the patient.  She has history of hypertension, hyperlipidemia, stroke and comes in because of pain and redness and swelling of her right foot and leg which started this morning.  She denies fever, chills, sweats.  She denies shortness of breath or chest pain.   Home Medications Prior to Admission medications   Medication Sig Start Date End Date Taking? Authorizing Provider  acetaminophen (TYLENOL) 650 MG CR tablet Take 650 mg by mouth every 8 (eight) hours as needed for pain.    [provider]  ALPRAZolam Prudy Feeler) 0.5 MG tablet Take 1 tablet (0.5 mg total) by mouth daily as needed for anxiety. 03/27/23   Nche, Bonna Gains, NP  aspirin EC 325 MG tablet Take 325 mg by mouth daily.    [provider]  cholestyramine (QUESTRAN) 4 g packet DISSOLVE & TAKE 1 PACKET BY MOUTH TWICE DAILY 06/12/23   Hilarie Fredrickson, MD  dicyclomine (BENTYL) 10 MG capsule Take 1 capsule by mouth twice daily 06/14/23   Nche, Bonna Gains, NP  diphenoxylate-atropine (LOMOTIL) 2.5-0.025 MG tablet TAKE 1 TABLET BY MOUTH TWICE DAILY AS NEEDED FOR DIARRHEA OR LOOSE STOOLS 06/26/23   Hilarie Fredrickson, MD  DULoxetine (CYMBALTA) 20 MG capsule Take 1 capsule by mouth once daily 03/12/23   Judi Saa, DO  famotidine (PEPCID) 20 MG tablet Take 1 tablet by mouth once daily 06/25/23   Nche, Bonna Gains, NP  fexofenadine (ALLEGRA) 180 MG tablet Take 180 mg by mouth daily.    [provider]  fluticasone (FLONASE) 50 MCG/ACT nasal spray Use 1 spray(s) in each nostril twice daily 01/02/23   Nche, Bonna Gains, NP  furosemide (LASIX) 20 MG tablet Take 1 tablet by mouth once daily 05/24/23   Nche, Bonna Gains, NP  IRON PO Take 65 mg by mouth 3  (three) times a week.    [provider]  losartan (COZAAR) 50 MG tablet Take 2 tablets (100 mg total) by mouth daily. 07/09/23   Nche, Bonna Gains, NP  metoprolol succinate (TOPROL-XL) 25 MG 24 hr tablet Take 1 tablet (25 mg total) by mouth daily. 07/09/23   Nche, Bonna Gains, NP  mupirocin ointment (BACTROBAN) 2 % Apply 1 Application topically 2 (two) times daily. Apply with dressing change 07/09/23   Nche, Bonna Gains, NP  potassium chloride SA (KLOR-CON M) 20 MEQ tablet Take 1 tablet (20 mEq total) by mouth daily. 07/09/23   Nche, Bonna Gains, NP  sodium chloride (OCEAN) 0.65 % SOLN nasal spray Place 1 spray into both nostrils as needed for congestion. 12/28/21   Nche, Bonna Gains, NP  Vitamin D, Ergocalciferol, (DRISDOL) 1.25 MG (50000 UNIT) CAPS capsule Take 1 capsule by mouth once a week 06/11/23   Judi Saa, DO      Allergies    Butrans [buprenorphine], Codeine, Morphine and codeine, and Morphine sulfate    Review of Systems   Review of Systems  All other systems reviewed and are negative.   Physical Exam Updated Vital Signs BP 114/72 (BP Location: Left Arm)   Pulse 79   Temp 99.2 F (37.3 C)   Resp 20   Ht 5' (1.524 m)   Wt 48.1  kg   SpO2 95%   BMI 20.70 kg/m  Physical Exam Vitals and nursing note reviewed.   89 year old female, resting comfortably and in no acute distress. Vital signs are normal. Oxygen saturation is 95%, which is normal. Head is normocephalic and atraumatic. PERRLA, EOMI. Oropharynx is clear. Neck is nontender and supple without adenopathy or JVD. Back is nontender and there is no CVA tenderness. Lungs are clear without rales, wheezes, or rhonchi. Chest is nontender. Heart has regular rate and rhythm with 2/6 systolic ejection murmur best heard at the apex. Abdomen is soft, flat, nontender. Extremities: There is erythema, warmth, swelling of the right foot with erythema extending up into the right lower leg.  There is a lymphangitic  streak going up the medial aspect of the left leg into the thigh to the groin.  There is mild tenderness in the left groin.  There are no palpable cords.  Left calf circumference is the same as right calf circumference. Skin is warm and dry without other rash. Neurologic: Mental status is normal, cranial nerves are intact, moves all extremities equally.           ED Results / Procedures / Treatments   Labs (all labs ordered are listed, but only abnormal results are displayed) Labs Reviewed  CBC WITH DIFFERENTIAL/PLATELET - Abnormal; Notable for the following components:      Result Value   WBC 15.8 (*)    Neutro Abs 13.3 (*)    Abs Immature Granulocytes 0.27 (*)    All other components within normal limits  BASIC METABOLIC PANEL - Abnormal; Notable for the following components:   Sodium 134 (*)    CO2 20 (*)    Glucose, Bld 152 (*)    Calcium 8.3 (*)    All other components within normal limits   Procedures Procedures  Cardiac monitor shows normal sinus rhythm, per my interpretation.  Medications Ordered in ED Medications  Rivaroxaban (XARELTO) tablet 15 mg (has no administration in time range)  cefTRIAXone (ROCEPHIN) 2 g in sodium chloride 0.9 % 100 mL IVPB (has no administration in time range)    ED Course/ Medical Decision Making/ A&P                                 Medical Decision Making Risk Prescription drug management. Decision regarding hospitalization.   Erythema and swelling of the right foot and lower leg with lymphangitic streak going up into the right thigh.  Overall picture is more consistent with cellulitis than DVT but DVT cannot be ruled out.  I have reviewed and interpreted her laboratory tests, and my interpretation is mild hyponatremia which is not felt to be clinically significant, elevated random glucose, moderate leukocytosis with left shift consistent with infection.  Vascular venous Doppler had been ordered but unable to be completed  tonight.  I have ordered a dose of rivaroxaban and a dose of ceftriaxone.  She does not clinically appear to be septic with normal blood pressure and heart rate and normal mental status.  However, I do feel she needs inpatient care at this point.  I have discussed the case with Dr. Loney Loh of [facility], who agrees to admit the patient.  Final Clinical Impression(s) / ED Diagnoses Final diagnoses:  Cellulitis of right leg  Hyponatremia  Elevated random blood glucose level    Rx / DC Orders ED Discharge Orders     None  Dione Booze, MD 07/20/23 [phone]

## 2023-07-20 ENCOUNTER — Other Ambulatory Visit: Payer: Self-pay

## 2023-07-20 ENCOUNTER — Observation Stay (HOSPITAL_COMMUNITY): Payer: Medicare Other

## 2023-07-20 DIAGNOSIS — F05 Delirium due to known physiological condition: Secondary | ICD-10-CM | POA: Diagnosis present

## 2023-07-20 DIAGNOSIS — R410 Disorientation, unspecified: Secondary | ICD-10-CM | POA: Diagnosis not present

## 2023-07-20 DIAGNOSIS — L03115 Cellulitis of right lower limb: Secondary | ICD-10-CM | POA: Diagnosis not present

## 2023-07-20 DIAGNOSIS — Z8673 Personal history of transient ischemic attack (TIA), and cerebral infarction without residual deficits: Secondary | ICD-10-CM | POA: Diagnosis not present

## 2023-07-20 DIAGNOSIS — Z79899 Other long term (current) drug therapy: Secondary | ICD-10-CM | POA: Diagnosis not present

## 2023-07-20 DIAGNOSIS — E871 Hypo-osmolality and hyponatremia: Secondary | ICD-10-CM | POA: Diagnosis present

## 2023-07-20 DIAGNOSIS — I10 Essential (primary) hypertension: Secondary | ICD-10-CM

## 2023-07-20 DIAGNOSIS — Z885 Allergy status to narcotic agent status: Secondary | ICD-10-CM | POA: Diagnosis not present

## 2023-07-20 DIAGNOSIS — M79661 Pain in right lower leg: Secondary | ICD-10-CM | POA: Diagnosis not present

## 2023-07-20 DIAGNOSIS — Z7982 Long term (current) use of aspirin: Secondary | ICD-10-CM | POA: Diagnosis not present

## 2023-07-20 DIAGNOSIS — Z888 Allergy status to other drugs, medicaments and biological substances status: Secondary | ICD-10-CM | POA: Diagnosis not present

## 2023-07-20 DIAGNOSIS — R011 Cardiac murmur, unspecified: Secondary | ICD-10-CM | POA: Diagnosis present

## 2023-07-20 DIAGNOSIS — M3500 Sicca syndrome, unspecified: Secondary | ICD-10-CM | POA: Diagnosis present

## 2023-07-20 DIAGNOSIS — K279 Peptic ulcer, site unspecified, unspecified as acute or chronic, without hemorrhage or perforation: Secondary | ICD-10-CM | POA: Diagnosis present

## 2023-07-20 DIAGNOSIS — I872 Venous insufficiency (chronic) (peripheral): Secondary | ICD-10-CM | POA: Diagnosis present

## 2023-07-20 DIAGNOSIS — Z66 Do not resuscitate: Secondary | ICD-10-CM | POA: Diagnosis present

## 2023-07-20 DIAGNOSIS — Z86010 Personal history of colonic polyps: Secondary | ICD-10-CM | POA: Diagnosis not present

## 2023-07-20 DIAGNOSIS — L0390 Cellulitis, unspecified: Secondary | ICD-10-CM | POA: Diagnosis present

## 2023-07-20 DIAGNOSIS — M7989 Other specified soft tissue disorders: Secondary | ICD-10-CM | POA: Diagnosis present

## 2023-07-20 DIAGNOSIS — E7800 Pure hypercholesterolemia, unspecified: Secondary | ICD-10-CM | POA: Diagnosis present

## 2023-07-20 DIAGNOSIS — Z8616 Personal history of COVID-19: Secondary | ICD-10-CM | POA: Diagnosis not present

## 2023-07-20 DIAGNOSIS — Z823 Family history of stroke: Secondary | ICD-10-CM | POA: Diagnosis not present

## 2023-07-20 DIAGNOSIS — Z8249 Family history of ischemic heart disease and other diseases of the circulatory system: Secondary | ICD-10-CM | POA: Diagnosis not present

## 2023-07-20 DIAGNOSIS — M797 Fibromyalgia: Secondary | ICD-10-CM | POA: Diagnosis present

## 2023-07-20 DIAGNOSIS — K219 Gastro-esophageal reflux disease without esophagitis: Secondary | ICD-10-CM | POA: Diagnosis present

## 2023-07-20 DIAGNOSIS — F419 Anxiety disorder, unspecified: Secondary | ICD-10-CM | POA: Diagnosis present

## 2023-07-20 DIAGNOSIS — K529 Noninfective gastroenteritis and colitis, unspecified: Secondary | ICD-10-CM | POA: Diagnosis present

## 2023-07-20 HISTORY — DX: Cellulitis, unspecified: L03.90

## 2023-07-20 HISTORY — DX: Hypo-osmolality and hyponatremia: E87.1

## 2023-07-20 LAB — CBC
HCT: 35.2 % — ABNORMAL LOW (ref 36.0–46.0)
Hemoglobin: 11.8 g/dL — ABNORMAL LOW (ref 12.0–15.0)
MCH: 31.7 pg (ref 26.0–34.0)
MCHC: 33.5 g/dL (ref 30.0–36.0)
MCV: 94.6 fL (ref 80.0–100.0)
Platelets: 175 10*3/uL (ref 150–400)
RBC: 3.72 MIL/uL — ABNORMAL LOW (ref 3.87–5.11)
RDW: 13.7 % (ref 11.5–15.5)
WBC: 13.2 10*3/uL — ABNORMAL HIGH (ref 4.0–10.5)
nRBC: 0 % (ref 0.0–0.2)

## 2023-07-20 LAB — BASIC METABOLIC PANEL
Anion gap: 10 (ref 5–15)
BUN: 15 mg/dL (ref 8–23)
CO2: 23 mmol/L (ref 22–32)
Calcium: 7.8 mg/dL — ABNORMAL LOW (ref 8.9–10.3)
Chloride: 102 mmol/L (ref 98–111)
Creatinine, Ser: 0.61 mg/dL (ref 0.44–1.00)
GFR, Estimated: >60 mL/min (ref >60)
Glucose, Bld: 126 mg/dL — ABNORMAL HIGH (ref 70–99)
Potassium: 3.3 mmol/L — ABNORMAL LOW (ref 3.5–5.1)
Sodium: 135 mmol/L (ref 135–145)

## 2023-07-20 MED ORDER — RIVAROXABAN 15 MG PO TABS
15.0000 mg | ORAL_TABLET | Freq: Once | ORAL | Status: AC
  Administered 2023-07-20: 15 mg via ORAL
  Filled 2023-07-20: qty 1

## 2023-07-20 MED ORDER — DULOXETINE HCL 20 MG PO CPEP
20.0000 mg | ORAL_CAPSULE | Freq: Every day | ORAL | Status: DC
  Administered 2023-07-20 – 2023-07-22 (×3): 20 mg via ORAL
  Filled 2023-07-20 (×3): qty 1

## 2023-07-20 MED ORDER — DIPHENOXYLATE-ATROPINE 2.5-0.025 MG PO TABS
1.0000 | ORAL_TABLET | Freq: Two times a day (BID) | ORAL | Status: DC | PRN
  Administered 2023-07-21 (×2): 1 via ORAL
  Filled 2023-07-20 (×3): qty 1

## 2023-07-20 MED ORDER — DICYCLOMINE HCL 10 MG PO CAPS
10.0000 mg | ORAL_CAPSULE | Freq: Two times a day (BID) | ORAL | Status: DC
  Administered 2023-07-20 – 2023-07-22 (×5): 10 mg via ORAL
  Filled 2023-07-20 (×5): qty 1

## 2023-07-20 MED ORDER — LOSARTAN POTASSIUM 50 MG PO TABS
100.0000 mg | ORAL_TABLET | Freq: Every day | ORAL | Status: DC
  Administered 2023-07-20 – 2023-07-22 (×3): 100 mg via ORAL
  Filled 2023-07-20 (×3): qty 2

## 2023-07-20 MED ORDER — FAMOTIDINE 20 MG PO TABS
20.0000 mg | ORAL_TABLET | Freq: Every day | ORAL | Status: DC
  Administered 2023-07-20 – 2023-07-22 (×3): 20 mg via ORAL
  Filled 2023-07-20 (×3): qty 1

## 2023-07-20 MED ORDER — SALINE SPRAY 0.65 % NA SOLN: 1.0000 | NASAL | Status: DC | PRN

## 2023-07-20 MED ORDER — ASPIRIN 325 MG PO TBEC
325.0000 mg | DELAYED_RELEASE_TABLET | Freq: Every day | ORAL | Status: DC
  Administered 2023-07-20 – 2023-07-22 (×3): 325 mg via ORAL
  Filled 2023-07-20 (×3): qty 1

## 2023-07-20 MED ORDER — SODIUM CHLORIDE 0.9 % IV SOLN: INTRAVENOUS | Status: AC

## 2023-07-20 MED ORDER — ALPRAZOLAM 0.5 MG PO TABS
0.5000 mg | ORAL_TABLET | Freq: Every day | ORAL | Status: DC | PRN
  Administered 2023-07-20: 0.5 mg via ORAL
  Filled 2023-07-20: qty 1

## 2023-07-20 MED ORDER — LORATADINE 10 MG PO TABS
10.0000 mg | ORAL_TABLET | Freq: Every day | ORAL | Status: DC
  Administered 2023-07-20 – 2023-07-22 (×3): 10 mg via ORAL
  Filled 2023-07-20 (×3): qty 1

## 2023-07-20 MED ORDER — METOPROLOL SUCCINATE ER 25 MG PO TB24
25.0000 mg | ORAL_TABLET | Freq: Every day | ORAL | Status: DC
  Administered 2023-07-20 – 2023-07-22 (×3): 25 mg via ORAL
  Filled 2023-07-20 (×3): qty 1

## 2023-07-20 MED ORDER — FLUTICASONE PROPIONATE 50 MCG/ACT NA SUSP
1.0000 | Freq: Two times a day (BID) | NASAL | Status: DC
  Filled 2023-07-20: qty 16

## 2023-07-20 MED ORDER — SODIUM CHLORIDE 0.9 % IV SOLN
2.0000 g | Freq: Once | INTRAVENOUS | Status: AC
  Administered 2023-07-20: 2 g via INTRAVENOUS
  Filled 2023-07-20: qty 20

## 2023-07-20 MED ORDER — FUROSEMIDE 20 MG PO TABS
20.0000 mg | ORAL_TABLET | Freq: Every day | ORAL | Status: DC
  Administered 2023-07-20 – 2023-07-22 (×3): 20 mg via ORAL
  Filled 2023-07-20 (×3): qty 1

## 2023-07-20 MED ORDER — CHOLESTYRAMINE 4 G PO PACK
4.0000 g | PACK | Freq: Two times a day (BID) | ORAL | Status: DC
  Administered 2023-07-20 – 2023-07-22 (×5): 4 g via ORAL
  Filled 2023-07-20 (×6): qty 1

## 2023-07-20 MED ORDER — SODIUM CHLORIDE 0.9 % IV SOLN
1.0000 g | INTRAVENOUS | Status: DC
  Administered 2023-07-21 – 2023-07-22 (×2): 1 g via INTRAVENOUS
  Filled 2023-07-20 (×2): qty 10

## 2023-07-20 NOTE — Progress Notes (Signed)
  Progress Note   Patient: Susan [name] GNF:[id number] DOB: 1924-05-29 DOA: 07/19/2023     0 DOS: the patient was seen and examined on 07/20/2023   Brief hospital course: 89 year old woman PMH chronic venous insufficiency bilateral lower extremities, chronic lower extremity wounds, presenting with right leg and foot pain with redness and swelling.  Admitted for right lower extremity cellulitis.  Consultants None   Procedures None  Assessment and Plan: Right lower extremity cellulitis   Mild improvement in swelling.  Still has erythema and tenderness lower extremity with streak all the way up the thigh.   Venous Doppler is negative.  Continue antibiotics.     Murmur   Monitor   Mild hyponatremia Trivial.  Check BMP in AM.   Essential hypertension Continue home medications including metoprolol, losartan, and Lasix.   Anxiety Continue duloxetine.   History of stroke Continue aspirin.   Chronic diarrhea Continue cholestyramine, Bentyl, and Lomotil as needed.   GERD/PUD Continue Pepcid.      Subjective:  Swelling RLE has decreased  Physical Exam: Vitals:   07/20/23 0200 07/20/23 0207 07/20/23 0220 07/20/23 0752  BP: (!) 141/64  (!) 154/62 (!) 141/69  Pulse: 70  75 75  Resp: (!) 24  16   Temp:  99 F (37.2 C) 98.3 F (36.8 C) 98.8 F (37.1 C)  TempSrc:  Oral Oral   SpO2: 99%  98% 96%  Weight:      Height:       Physical Exam Vitals reviewed.  Constitutional:      General: She is not in acute distress.    Appearance: She is not ill-appearing or toxic-appearing.  Cardiovascular:     Rate and Rhythm: Normal rate and regular rhythm.     Heart sounds: No murmur heard. Pulmonary:     Effort: Pulmonary effort is normal. No respiratory distress.     Breath sounds: No wheezing, rhonchi or rales.  Neurological:     Mental Status: She is alert.  Psychiatric:        Mood and Affect: Mood normal.        Behavior: Behavior normal.     Data Reviewed: K+  134 WBC 15.8 LE venous duplex negative  Family Communication: daughter at bedside  Disposition: Status is: Inpatient     Time spent: 25 minutes  Author: Brendia Sacks, MD 07/20/2023 12:37 PM  For on call review [email or web address].

## 2023-07-20 NOTE — Progress Notes (Signed)
Lower extremity venous duplex completed. Please see CV Procedures for preliminary results.  Shona Simpson, RVT 07/20/23 9:31 AM

## 2023-07-20 NOTE — H&P (Signed)
History and Physical    Susan [name] WUJ:[id number] DOB: October 13, 1924 DOA: 07/19/2023  PCP: Anne Ng, NP  Patient coming from: [facility]  Chief Complaint: Right leg/foot pain, redness, swelling  HPI: Susan [name] is a 89 y.o. female with medical history significant of hypertension, hyperlipidemia, chronic venous insufficiency of both lower extremities, chronic lower extremity wounds, sicca syndrome, hyperlipidemia, GERD, PUD, anxiety, stroke, chronic diarrhea presented to ED with complaints of right leg/foot pain, redness, and swelling.  Pedal pulse dopplerable.  Vital signs stable.  Labs showing WBC 15.8, sodium 134, bicarb 20, glucose 152, creatinine 0.7. Patient was given a dose of Xarelto and right lower extremity Doppler ordered to rule out DVT.  She was also given a dose of ceftriaxone for suspected cellulitis.  TRH called to admit.  Patient is reporting right lower leg/foot erythema, pain, and swelling.  She thinks this started about 2 days ago.  Her foot feels sore anytime somebody tries to touch it.  She denies history of blood clots.  Denies chest pain or shortness of breath.  Denies fevers.  No other complaints.  Review of Systems:  Review of Systems  All other systems reviewed and are negative.   Past Medical History:  Diagnosis Date   Actinic keratosis, hx of    chest wall (2012)   Allergic rhinitis    Anxiety    Arthritis    "spine, hips" (01/30/2017)   Bursitis    Cellulitis of right foot 01/30/2017   Colon polyps    Diverticulosis    Esophageal dysmotilities 10/16/2017   Fibromyalgia    "qwhere" (01/30/2017)   Gallstones    GERD (gastroesophageal reflux disease)    High cholesterol    History of kidney stones    History of stomach ulcers    Hx of squamous cell carcinoma excision    right mandible (2011), right arm (2014)   Hypertension    IBS (irritable bowel syndrome)    Lymphangitis 12/06/2018   Osteoarthritis    Pneumonia 2000s X 2    "twice" (01/30/2017)   Pyloric stenosis    Stroke (HCC) 10/2014   denies residual on 01/30/2017    Past Surgical History:  Procedure Laterality Date   APPENDECTOMY     CARDIOVASCULAR STRESS TEST  04/2016   nuclear cardiolite stress test done in [place] (no ischemia, no LVH, no LV systolic function)   CATARACT EXTRACTION W/ INTRAOCULAR LENS  IMPLANT, BILATERAL Bilateral    CHOLECYSTECTOMY OPEN     COLONOSCOPY  2009, 2014   DIAGNOSTIC MAMMOGRAM  2009, 2012   TONSILLECTOMY       reports that she has never smoked. She has never used smokeless tobacco. She reports that she does not currently use alcohol. She reports that she does not use drugs.  Allergies  Allergen Reactions   Butrans [Buprenorphine] Other (See Comments)    Severe chest pains   Codeine Other (See Comments)    Severe Chest pains  Other Reaction(s): severe chest pain   Morphine And Codeine Other (See Comments)    Severe chest pains   Morphine Sulfate     Other Reaction(s): severe chest pain     Family History  Problem Relation Age of Onset   Stroke Mother    Heart attack Mother    Stroke Father    Heart attack Father    Heart attack Maternal Grandmother    Stroke Maternal Grandmother    Stroke Paternal Grandmother    Heart attack Paternal Grandmother  Heart attack Maternal Grandfather    Stroke Maternal Grandfather    Stroke Paternal Grandfather    Heart attack Paternal Grandfather    Colon cancer Neg Hx    Pancreatic cancer Neg Hx    Stomach cancer Neg Hx    Esophageal cancer Neg Hx     Prior to Admission medications   Medication Sig Start Date End Date Taking? Authorizing Provider  acetaminophen (TYLENOL) 650 MG CR tablet Take 650 mg by mouth every 8 (eight) hours as needed for pain.   Yes [provider]  ALPRAZolam Prudy Feeler) 0.5 MG tablet Take 1 tablet (0.5 mg total) by mouth daily as needed for anxiety. 03/27/23  Yes Nche, Bonna Gains, NP  aspirin EC 325 MG tablet Take 325 mg by  mouth daily.   Yes [provider]  diphenoxylate-atropine (LOMOTIL) 2.5-0.025 MG tablet TAKE 1 TABLET BY MOUTH TWICE DAILY AS NEEDED FOR DIARRHEA OR LOOSE STOOLS 06/26/23  Yes Hilarie Fredrickson, MD  DULoxetine (CYMBALTA) 20 MG capsule Take 1 capsule by mouth once daily 03/12/23  Yes Antoine Primas M, DO  famotidine (PEPCID) 20 MG tablet Take 1 tablet by mouth once daily 06/25/23  Yes Nche, Bonna Gains, NP  fexofenadine (ALLEGRA) 180 MG tablet Take 180 mg by mouth daily.   Yes [provider]  fluticasone (FLONASE) 50 MCG/ACT nasal spray Use 1 spray(s) in each nostril twice daily 01/02/23  Yes Nche, Bonna Gains, NP  furosemide (LASIX) 20 MG tablet Take 1 tablet by mouth once daily 05/24/23  Yes Nche, Bonna Gains, NP  IRON PO Take 65 mg by mouth 3 (three) times a week.   Yes [provider]  cholestyramine (QUESTRAN) 4 g packet DISSOLVE & TAKE 1 PACKET BY MOUTH TWICE DAILY 06/12/23   Hilarie Fredrickson, MD  dicyclomine (BENTYL) 10 MG capsule Take 1 capsule by mouth twice daily 06/14/23   Nche, Bonna Gains, NP  losartan (COZAAR) 50 MG tablet Take 2 tablets (100 mg total) by mouth daily. 07/09/23   Nche, Bonna Gains, NP  metoprolol succinate (TOPROL-XL) 25 MG 24 hr tablet Take 1 tablet (25 mg total) by mouth daily. 07/09/23   Nche, Bonna Gains, NP  mupirocin ointment (BACTROBAN) 2 % Apply 1 Application topically 2 (two) times daily. Apply with dressing change 07/09/23   Nche, Bonna Gains, NP  potassium chloride SA (KLOR-CON M) 20 MEQ tablet Take 1 tablet (20 mEq total) by mouth daily. 07/09/23   Nche, Bonna Gains, NP  sodium chloride (OCEAN) 0.65 % SOLN nasal spray Place 1 spray into both nostrils as needed for congestion. 12/28/21   Nche, Bonna Gains, NP  Vitamin D, Ergocalciferol, (DRISDOL) 1.25 MG (50000 UNIT) CAPS capsule Take 1 capsule by mouth once a week Patient not taking: Reported on 07/20/2023 06/11/23   Judi Saa, DO    Physical Exam: Vitals:   07/20/23 0145  07/20/23 0200 07/20/23 0207 07/20/23 0220  BP: 135/62 (!) 141/64  (!) 154/62  Pulse: 65 70  75  Resp: (!) 23 (!) 24  16  Temp:   99 F (37.2 C) 98.3 F (36.8 C)  TempSrc:   Oral Oral  SpO2: 98% 99%  98%  Weight:      Height:        Physical Exam Vitals reviewed.  Constitutional:      General: She is not in acute distress. HENT:     Head: Normocephalic and atraumatic.  Eyes:     Extraocular Movements: Extraocular movements intact.  Cardiovascular:     Rate and Rhythm: Normal rate and regular rhythm.     Pulses: Normal pulses.     Heart sounds: Murmur heard.  Pulmonary:     Effort: Pulmonary effort is normal. No respiratory distress.     Breath sounds: Normal breath sounds. No wheezing or rales.  Abdominal:     General: Bowel sounds are normal. There is no distension.     Palpations: Abdomen is soft.     Tenderness: There is no abdominal tenderness.  Musculoskeletal:     Cervical back: Normal range of motion.     Comments: Right lower leg/foot erythematous, swollen, and warm to touch.  Erythema streaking up from the leg toward the medial aspect of the upper leg/inner thigh.  Skin:    General: Skin is warm and dry.  Neurological:     General: No focal deficit present.     Mental Status: She is alert and oriented to person, place, and time.     Labs on Admission: I have personally reviewed following labs and imaging studies  CBC: Recent Labs  Lab 07/19/23 1935  WBC 15.8*  NEUTROABS 13.3*  HGB 13.5  HCT 41.3  MCV 93.0  PLT 219   Basic Metabolic Panel: Recent Labs  Lab 07/19/23 1935  NA 134*  K 3.6  CL 99  CO2 20*  GLUCOSE 152*  BUN 15  CREATININE 0.73  CALCIUM 8.3*   GFR: Estimated Creatinine Clearance: 29.5 mL/min (by C-G formula based on SCr of 0.73 mg/dL). Liver Function Tests: No results for input(s): "AST", "ALT", "ALKPHOS", "BILITOT", "PROT", "ALBUMIN" in the last 168 hours. No results for input(s): "LIPASE", "AMYLASE" in the last 168  hours. No results for input(s): "AMMONIA" in the last 168 hours. Coagulation Profile: No results for input(s): "INR", "PROTIME" in the last 168 hours. Cardiac Enzymes: No results for input(s): "CKTOTAL", "CKMB", "CKMBINDEX", "TROPONINI" in the last 168 hours. BNP (last 3 results) No results for input(s): "PROBNP" in the last 8760 hours. HbA1C: No results for input(s): "HGBA1C" in the last 72 hours. CBG: No results for input(s): "GLUCAP" in the last 168 hours. Lipid Profile: No results for input(s): "CHOL", "HDL", "LDLCALC", "TRIG", "CHOLHDL", "LDLDIRECT" in the last 72 hours. Thyroid Function Tests: No results for input(s): "TSH", "T4TOTAL", "FREET4", "T3FREE", "THYROIDAB" in the last 72 hours. Anemia Panel: No results for input(s): "VITAMINB12", "FOLATE", "FERRITIN", "TIBC", "IRON", "RETICCTPCT" in the last 72 hours. Urine analysis:    Component Value Date/Time   COLORURINE YELLOW 06/30/2021 1241   APPEARANCEUR CLEAR 06/30/2021 1241   LABSPEC 1.018 06/30/2021 1241   PHURINE 6.0 06/30/2021 1241   GLUCOSEU NEGATIVE 06/30/2021 1241   HGBUR SMALL (A) 06/30/2021 1241   BILIRUBINUR NEGATIVE 06/30/2021 1241   KETONESUR 20 (A) 06/30/2021 1241   PROTEINUR NEGATIVE 06/30/2021 1241   NITRITE NEGATIVE 06/30/2021 1241   LEUKOCYTESUR NEGATIVE 06/30/2021 1241    Radiological Exams on Admission: No results found.  Assessment and Plan  Right lower extremity cellulitis versus ?DVT Patient presenting with right lower leg/foot pain pain, erythema, and swelling concerning for cellulitis versus ?DVT.  Pedal pulse dopplerable in the ED.  Patient was given a dose of Xarelto in the ED and right lower extremity Doppler ordered to rule out DVT.  PE less likely given no chest pain, shortness of breath, or hypoxia.  Continue ceftriaxone for suspected cellulitis.  WBC 15.8, no fever or signs of sepsis.  Continue to monitor WBC count.  Murmur appreciated on exam No previous echo  results in the chart.   Discussed with the patient and she does not seem to be symptomatic from this.  Denies chest pain, shortness of breath, dizziness, or any episodes of syncope.  Patient is not interested in pursuing any further workup or surgery given her age.  Mild hyponatremia Sodium 134, previously >140 on labs done 6 months ago.  Gentle IV fluid hydration with normal saline and continue to monitor labs.  Hypertension Continue home medications including metoprolol, losartan, and Lasix.  Anxiety Continue duloxetine.  History of stroke Continue aspirin.  Chronic diarrhea Continue cholestyramine, Bentyl, and Lomotil as needed.  GERD/PUD Continue Pepcid.  Code Status: DNR/DNI (discussed with the patient) Family Communication: No family available at this time. Level of care: Telemetry bed Admission status: It is my clinical opinion that referral for OBSERVATION is reasonable and necessary in this patient based on the above information provided. The aforementioned taken together are felt to place the patient at high risk for further clinical deterioration. However, it is anticipated that the patient may be medically stable for discharge from the hospital within 24 to 48 hours.  John Giovanni MD [facility]  If 7PM-7AM, please contact night-coverage [email or web address]  07/20/2023, 5:28 AM

## 2023-07-20 NOTE — ED Notes (Signed)
ED TO INPATIENT HANDOFF REPORT  ED Nurse Name and Phone #:  Alphonzo Lemmings, RN   S Name/Age/Gender Susan [name] 89 y.o. female Room/Bed: 034C/034C  Code Status   Code Status: Prior  Home/SNF/Other [facility] Patient oriented to: self, place, time, and situation Is this baseline? Yes   Triage Complete: Triage complete  Chief Complaint Cellulitis [L03.90]  Triage Note PER EMS: pt arrives [facility] assisted living with c/o right leg and foot swelling and redness and painful to the touch. Denies injury. + pedal pulse with use of doppler  127/60, HR-80, 98% RA   Allergies Allergies  Allergen Reactions   Butrans [Buprenorphine] Other (See Comments)    Severe chest pains   Codeine Other (See Comments)    Severe Chest pains  Other Reaction(s): severe chest pain   Morphine And Codeine Other (See Comments)    Severe chest pains   Morphine Sulfate     Other Reaction(s): severe chest pain     Level of Care/Admitting Diagnosis ED Disposition     ED Disposition  Admit   Condition  --   Comment  Hospital Area: [facility] [[id number]]  Level of Care: Telemetry Medical [104]  May place patient in observation at [facility] or [facility] if equivalent level of care is available:: Yes  Covid Evaluation: Asymptomatic - no recent exposure (last 10 days) testing not required  Diagnosis: Cellulitis [440347]  Admitting Physician: John Giovanni [[id number]]  Attending Physician: John Giovanni [[id number]]          B Medical/Surgery History Past Medical History:  Diagnosis Date   Actinic keratosis, hx of    chest wall (2012)   Allergic rhinitis    Anxiety    Arthritis    "spine, hips" (01/30/2017)   Bursitis    Cellulitis of right foot 01/30/2017   Colon polyps    Diverticulosis    Esophageal dysmotilities 10/16/2017   Fibromyalgia    "qwhere" (01/30/2017)   Gallstones    GERD (gastroesophageal reflux disease)    High cholesterol    History  of kidney stones    History of stomach ulcers    Hx of squamous cell carcinoma excision    right mandible (2011), right arm (2014)   Hypertension    IBS (irritable bowel syndrome)    Lymphangitis 12/06/2018   Osteoarthritis    Pneumonia 2000s X 2   "twice" (01/30/2017)   Pyloric stenosis    Stroke (HCC) 10/2014   denies residual on 01/30/2017   Past Surgical History:  Procedure Laterality Date   APPENDECTOMY     CARDIOVASCULAR STRESS TEST  04/2016   nuclear cardiolite stress test done in [place] (no ischemia, no LVH, no LV systolic function)   CATARACT EXTRACTION W/ INTRAOCULAR LENS  IMPLANT, BILATERAL Bilateral    CHOLECYSTECTOMY OPEN     COLONOSCOPY  2009, 2014   DIAGNOSTIC MAMMOGRAM  2009, 2012   TONSILLECTOMY       A IV Location/Drains/Wounds Patient Lines/Drains/Airways Status     Active Line/Drains/Airways     Name Placement date Placement time Site Days   Peripheral IV 07/20/23 22 G Right Antecubital 07/20/23  0050  Antecubital  less than 1            Intake/Output Last 24 hours  Intake/Output Summary (Last 24 hours) at 07/20/2023 0137 Last data filed at 07/20/2023 0132 Gross per 24 hour  Intake 100 ml  Output --  Net 100 ml    Labs/Imaging Results for  orders placed or performed during the hospital encounter of 07/19/23 (from the past 48 hour(s))  CBC with Differential     Status: Abnormal   Collection Time: 07/19/23  7:35 PM  Result Value Ref Range   WBC 15.8 (H) 4.0 - 10.5 K/uL   RBC 4.44 3.87 - 5.11 MIL/uL   Hemoglobin 13.5 12.0 - 15.0 g/dL   HCT 09.8 11.9 - 14.7 %   MCV 93.0 80.0 - 100.0 fL   MCH 30.4 26.0 - 34.0 pg   MCHC 32.7 30.0 - 36.0 g/dL   RDW 82.9 56.2 - 13.0 %   Platelets 219 150 - 400 K/uL   nRBC 0.0 0.0 - 0.2 %   Neutrophils Relative % 85 %   Neutro Abs 13.3 (H) 1.7 - 7.7 K/uL   Lymphocytes Relative 7 %   Lymphs Abs 1.2 0.7 - 4.0 K/uL   Monocytes Relative 6 %   Monocytes Absolute 1.0 0.1 - 1.0 K/uL   Eosinophils Relative 0 %    Eosinophils Absolute 0.0 0.0 - 0.5 K/uL   Basophils Relative 0 %   Basophils Absolute 0.0 0.0 - 0.1 K/uL   Immature Granulocytes 2 %   Abs Immature Granulocytes 0.27 (H) 0.00 - 0.07 K/uL    Comment: Performed at [facility], [address] [place], [place] [address]  Basic metabolic panel     Status: Abnormal   Collection Time: 07/19/23  7:35 PM  Result Value Ref Range   Sodium 134 (L) 135 - 145 mmol/L   Potassium 3.6 3.5 - 5.1 mmol/L   Chloride 99 98 - 111 mmol/L   CO2 20 (L) 22 - 32 mmol/L   Glucose, Bld 152 (H) 70 - 99 mg/dL    Comment: Glucose reference range applies only to samples taken after fasting for at least 8 hours.   BUN 15 8 - 23 mg/dL   Creatinine, Ser 4.69 0.44 - 1.00 mg/dL   Calcium 8.3 (L) 8.9 - 10.3 mg/dL   GFR, Estimated >62 >95 mL/min    Comment: (NOTE) Calculated using the CKD-EPI Creatinine Equation (2021)    Anion gap 15 5 - 15    Comment: Performed at [facility], [address] [place], [place] [address]   No results found.  Pending Labs Unresulted Labs (From admission, onward)    None       Vitals/Pain Today's Vitals   07/19/23 1847 07/19/23 2229 07/20/23 0100 07/20/23 0130  BP:  114/72 132/68 132/61  Pulse:  79 70 66  Resp:  20 (!) 23 (!) 23  Temp:  99.2 F (37.3 C)    TempSrc:      SpO2:  95% 98% 97%  Weight: 106 lb (48.1 kg)     Height: 5' (1.524 m)     PainSc:        Isolation Precautions No active isolations  Medications Medications  Rivaroxaban (XARELTO) tablet 15 mg (has no administration in time range)  cefTRIAXone (ROCEPHIN) 2 g in sodium chloride 0.9 % 100 mL IVPB (0 g Intravenous Stopped 07/20/23 0132)    Mobility walks with device     Focused Assessments    R Recommendations: See Admitting Provider Note  Report given to:   Additional Notes:  Pt from [facility] Pt one person assist. Pt a&ox4.

## 2023-07-20 NOTE — Plan of Care (Signed)

## 2023-07-20 NOTE — Hospital Course (Addendum)
89 year old woman PMH chronic venous insufficiency bilateral lower extremities, chronic lower extremity wounds, presenting with right leg and foot pain with redness and swelling.  Admitted for right lower extremity cellulitis.  Consultants None   Procedures None

## 2023-07-20 NOTE — Plan of Care (Signed)

## 2023-07-21 DIAGNOSIS — R410 Disorientation, unspecified: Secondary | ICD-10-CM

## 2023-07-21 DIAGNOSIS — L03115 Cellulitis of right lower limb: Secondary | ICD-10-CM | POA: Diagnosis not present

## 2023-07-21 MED ORDER — ACETAMINOPHEN 325 MG PO TABS
650.0000 mg | ORAL_TABLET | Freq: Four times a day (QID) | ORAL | Status: DC | PRN
  Administered 2023-07-21 – 2023-07-22 (×2): 650 mg via ORAL
  Filled 2023-07-21 (×2): qty 2

## 2023-07-21 NOTE — Progress Notes (Signed)
  Progress Note   Patient: Susan [name] OZD:[id number] DOB: [birth year] DOA: 07/19/2023     1 DOS: the patient was seen and examined on 07/21/2023   Brief hospital course: 89 year old woman PMH chronic venous insufficiency bilateral lower extremities, chronic lower extremity wounds, presenting with right leg and foot pain with redness and swelling.  Admitted for right lower extremity cellulitis.  Consultants None   Procedures None  Assessment and Plan: Right lower extremity cellulitis   Venous Doppler negative.   Improving slowly with decreasing erythema streaking up the leg and erythema of the foot.  Continue antibiotics. Likely can go home tomorrow normal antibiotics have improved.  Acute delirium No history of dementia per daughter.  Daughter reported sundowning type phenomenon with paranoia. Continue treatment of infection, expect spontaneous resolution after discharge.   Murmur     Mild hyponatremia Trivial.  Check BMP in AM.   Essential hypertension Stable.  Continue home medications including metoprolol, losartan, and Lasix.   Anxiety Continue duloxetine.   History of stroke Continue aspirin.   Chronic diarrhea Continue cholestyramine, Bentyl, and Lomotil as needed.   GERD/PUD Continue Pepcid.      Subjective:  Daughter reports severe delirium last night, paranoid No h/o dementia  Patient reports some leg pain today  Physical Exam: Vitals:   07/20/23 0752 07/20/23 2038 07/21/23 0607 07/21/23 0800  BP: (!) 141/69 (!) 145/78 (!) 144/67 (!) 145/68  Pulse: 75 84 66 68  Resp:  18 18   Temp: 98.8 F (37.1 C) 98.6 F (37 C) 98.1 F (36.7 C) 99 F (37.2 C)  TempSrc:  Oral Oral Oral  SpO2: 96% 95% 95% 96%  Weight:      Height:       Physical Exam Vitals reviewed.  Constitutional:      General: She is not in acute distress.    Appearance: She is not ill-appearing or toxic-appearing.  Cardiovascular:     Rate and Rhythm: Normal rate and regular  rhythm.     Heart sounds: No murmur heard. Pulmonary:     Effort: Pulmonary effort is normal. No respiratory distress.     Breath sounds: No wheezing, rhonchi or rales.  Musculoskeletal:     Right lower leg: No edema.     Left lower leg: No edema.  Skin:    Comments: RLE erythema decreasing, with marked decrease in erythema streaking up leg, and less erythema dorsum of foot. No fluctuance, wounds or exudate.  Neurological:     Mental Status: She is alert.     Comments: Oriented to self, location, month, not year; aware in hospital for leg  Psychiatric:        Mood and Affect: Mood normal.        Behavior: Behavior normal.     Data Reviewed: No new data  Family Communication: daughter at bedside  Disposition: Status is: Inpatient Remains inpatient appropriate because: cellulitis     Time spent: 20 minutes  Author: Brendia Sacks, MD 07/21/2023 12:14 PM  For on call review [email or web address].

## 2023-07-21 NOTE — Plan of Care (Signed)

## 2023-07-22 DIAGNOSIS — L03115 Cellulitis of right lower limb: Secondary | ICD-10-CM | POA: Diagnosis not present

## 2023-07-22 DIAGNOSIS — I10 Essential (primary) hypertension: Secondary | ICD-10-CM | POA: Diagnosis not present

## 2023-07-22 MED ORDER — CEFADROXIL 500 MG PO CAPS: 1.0000 g | ORAL_CAPSULE | Freq: Two times a day (BID) | ORAL | 0 refills | Status: DC

## 2023-07-22 NOTE — Discharge Summary (Signed)
Physician Discharge Summary   Patient: Susan [name] MRN: [id number] DOB: 05/03/1924  Admit date:     07/19/2023  Discharge date: 07/22/23  Discharge Physician: Brendia Sacks   PCP: Anne Ng, NP   Recommendations at discharge:  Resolution of right lower extremity erythema  Discharge Diagnoses: Principal Problem:   Cellulitis Active Problems:   Essential hypertension   Anxiety   Murmur, cardiac   Hyponatremia Acute delirium  Resolved Problems:   * No resolved hospital problems. *  Hospital Course: 89 year old woman PMH chronic venous insufficiency bilateral lower extremities, chronic lower extremity wounds, presenting with right leg and foot pain with redness and swelling.  Admitted for right lower extremity cellulitis.  Improved with IV antibiotics.  Discharged home in good condition.  Consultants None   Procedures None  Right lower extremity cellulitis   Venous Doppler negative.   Improving slowly with near resolution of erythema.  Complete short course of antibiotics as an outpatient.   Acute delirium No history of dementia per daughter.  Daughter reported sundowning type phenomenon with paranoia. Continue treatment of infection, expect spontaneous resolution after discharge.   Murmur     Mild hyponatremia Trivial.  Check BMP in AM.   Essential hypertension Stable.  Continue home medications including metoprolol, losartan, and Lasix.   Anxiety Continue duloxetine.   History of stroke Continue aspirin.   Chronic diarrhea Continue cholestyramine, Bentyl, and Lomotil as needed.   GERD/PUD Continue Pepcid.  Disposition: Home Diet recommendation:  Regular diet DISCHARGE MEDICATION: Allergies as of 07/22/2023       Reactions   Butrans [buprenorphine] Other (See Comments)   Severe chest pains   Codeine Other (See Comments)   Severe Chest pains Other Reaction(s): severe chest pain   Morphine And Codeine Other (See Comments)   Severe  chest pains   Morphine Sulfate    Other Reaction(s): severe chest pain         Medication List     TAKE these medications    acetaminophen 650 MG CR tablet Commonly known as: TYLENOL Take 650 mg by mouth every 8 (eight) hours as needed for pain.   ALPRAZolam 0.5 MG tablet Commonly known as: XANAX Take 1 tablet (0.5 mg total) by mouth daily as needed for anxiety.   aspirin EC 325 MG tablet Take 325 mg by mouth daily.   cefadroxil 500 MG capsule Commonly known as: DURICEF Take 2 capsules (1,000 mg total) by mouth 2 (two) times daily.   cholestyramine 4 g packet Commonly known as: QUESTRAN DISSOLVE & TAKE 1 PACKET BY MOUTH TWICE DAILY   dicyclomine 10 MG capsule Commonly known as: BENTYL Take 1 capsule by mouth twice daily   diphenoxylate-atropine 2.5-0.025 MG tablet Commonly known as: LOMOTIL TAKE 1 TABLET BY MOUTH TWICE DAILY AS NEEDED FOR DIARRHEA OR LOOSE STOOLS   DULoxetine 20 MG capsule Commonly known as: CYMBALTA Take 1 capsule by mouth once daily   famotidine 20 MG tablet Commonly known as: PEPCID Take 1 tablet by mouth once daily   fexofenadine 180 MG tablet Commonly known as: ALLEGRA Take 180 mg by mouth daily.   fluticasone 50 MCG/ACT nasal spray Commonly known as: FLONASE Use 1 spray(s) in each nostril twice daily   furosemide 20 MG tablet Commonly known as: LASIX Take 1 tablet by mouth once daily   IRON PO Take 65 mg by mouth 3 (three) times a week.   losartan 50 MG tablet Commonly known as: COZAAR Take 2 tablets (100 mg total)  by mouth daily.   metoprolol succinate 25 MG 24 hr tablet Commonly known as: TOPROL-XL Take 1 tablet (25 mg total) by mouth daily.   mupirocin ointment 2 % Commonly known as: BACTROBAN Apply 1 Application topically 2 (two) times daily. Apply with dressing change   potassium chloride SA 20 MEQ tablet Commonly known as: KLOR-CON M Take 1 tablet (20 mEq total) by mouth daily.   sodium chloride 0.65 % Soln  nasal spray Commonly known as: OCEAN Place 1 spray into both nostrils as needed for congestion.   Vitamin D (Ergocalciferol) 1.25 MG (50000 UNIT) Caps capsule Commonly known as: DRISDOL Take 1 capsule by mouth once a week        Follow-up Information     Nche, Bonna Gains, NP. Schedule an appointment as soon as possible for a visit in 1 week(s).   Specialty: Internal Medicine Contact information: [address] [place] [address] [phone]                Feels better  Discharge Exam: Filed Weights   07/19/23 1847  Weight: 48.1 kg   Physical Exam Vitals reviewed.  Constitutional:      General: She is not in acute distress.    Appearance: She is not ill-appearing or toxic-appearing.  Cardiovascular:     Rate and Rhythm: Normal rate and regular rhythm.     Heart sounds: No murmur heard. Pulmonary:     Effort: Pulmonary effort is normal. No respiratory distress.     Breath sounds: No wheezing, rhonchi or rales.  Skin:    Comments: Near resolution of right lower extremity erythema.  Neurological:     Mental Status: She is alert.  Psychiatric:        Behavior: Behavior normal.      Condition at discharge: good  The results of significant diagnostics from this hospitalization (including imaging, microbiology, ancillary and laboratory) are listed below for reference.   Imaging Studies: VAS [place] LOWER EXTREMITY VENOUS (DVT) (ONLY [facility] & [facility])  Result Date: 07/20/2023  Lower Venous DVT Study Patient Name:  Susan [name]  Date of Exam:   07/20/2023 Medical Rec #: [id number]      Accession #:    [id number] Date of Birth: 03/24/1924     Patient Gender: F Patient Age:   89 years Exam Location:  [facility] Procedure:      VAS [place] LOWER EXTREMITY VENOUS (DVT) Referring Phys: Ulyess Blossom RATHORE --------------------------------------------------------------------------------  Indications: Swelling, Pain, and rubor.  Risk Factors: Past pregnancy. Comparison  Study: No prior study Performing Technologist: Shona Simpson  Examination Guidelines: A complete evaluation includes B-mode imaging, spectral Doppler, color Doppler, and power Doppler as needed of all accessible portions of each vessel. Bilateral testing is considered an integral part of a complete examination. Limited examinations for reoccurring indications may be performed as noted. The reflux portion of the exam is performed with the patient in reverse Trendelenburg.  +---------+---------------+---------+-----------+----------+--------------+ RIGHT    CompressibilityPhasicitySpontaneityPropertiesThrombus Aging +---------+---------------+---------+-----------+----------+--------------+ CFV      Full           Yes      Yes                                 +---------+---------------+---------+-----------+----------+--------------+ SFJ      Full                                                        +---------+---------------+---------+-----------+----------+--------------+  FV Prox  Full                                                        +---------+---------------+---------+-----------+----------+--------------+ FV Mid   Full                                                        +---------+---------------+---------+-----------+----------+--------------+ FV DistalFull                                                        +---------+---------------+---------+-----------+----------+--------------+ PFV      Full                                                        +---------+---------------+---------+-----------+----------+--------------+ POP      Full           Yes      Yes                                 +---------+---------------+---------+-----------+----------+--------------+ PTV      Full                                                        +---------+---------------+---------+-----------+----------+--------------+ PERO     Full                                                         +---------+---------------+---------+-----------+----------+--------------+   +----+---------------+---------+-----------+----------+--------------+ LEFTCompressibilityPhasicitySpontaneityPropertiesThrombus Aging +----+---------------+---------+-----------+----------+--------------+ CFV Full           Yes      Yes                                 +----+---------------+---------+-----------+----------+--------------+     Summary: RIGHT: - There is no evidence of deep vein thrombosis in the lower extremity.  - No cystic structure found in the popliteal fossa.  LEFT: - No evidence of common femoral vein obstruction.  *See table(s) above for measurements and observations. Electronically signed by Lemar Livings MD on 07/20/2023 at 7:14:09 PM.    Final     Microbiology: Results for orders placed or performed during the hospital encounter of 06/30/21  Blood culture (routine x 2)     Status: None   Collection Time: 06/30/21 12:42 PM   Specimen: BLOOD  Result Value Ref Range Status   Specimen Description BLOOD SITE NOT SPECIFIED  Final   Special Requests   Final    BOTTLES DRAWN AEROBIC AND ANAEROBIC Blood Culture results may not be optimal due to an inadequate volume of blood received in culture bottles   Culture   Final    NO GROWTH 5 DAYS Performed at [facility], [address] [place], [place] [address]    Report Status 07/05/2021 FINAL  Final  Blood culture (routine x 2)     Status: None   Collection Time: 06/30/21 12:47 PM   Specimen: BLOOD  Result Value Ref Range Status   Specimen Description BLOOD SITE NOT SPECIFIED  Final   Special Requests   Final    BOTTLES DRAWN AEROBIC AND ANAEROBIC Blood Culture results may not be optimal due to an inadequate volume of blood received in culture bottles   Culture   Final    NO GROWTH 5 DAYS Performed at [facility], [address], [place], [place] [address]    Report Status  07/05/2021 FINAL  Final  Resp Panel by RT-PCR (Flu A&B, Covid) Nasopharyngeal Swab     Status: Abnormal   Collection Time: 06/30/21  1:01 PM   Specimen: Nasopharyngeal Swab; Nasopharyngeal(NP) swabs in vial transport medium  Result Value Ref Range Status   SARS Coronavirus 2 by RT PCR POSITIVE (A) NEGATIVE Final    Comment: RESULT CALLED TO, READ BACK BY AND VERIFIED WITH: RN Loma Sousa 06/30/21@14 :53 by tw (NOTE) SARS-CoV-2 target nucleic acids are DETECTED.  The SARS-CoV-2 RNA is generally detectable in upper respiratory specimens during the acute phase of infection. Positive results are indicative of the presence of the identified virus, but do not rule out bacterial infection or co-infection with other pathogens not detected by the test. Clinical correlation with patient history and other diagnostic information is necessary to determine patient infection status. The expected result is Negative.  Fact Sheet for Patients: BloggerCourse.com  Fact Sheet for Healthcare Providers: SeriousBroker.it  This test is not yet approved or cleared by the [place] FDA and  has been authorized for detection and/or diagnosis of SARS-CoV-2 by FDA under an Emergency Use Authorization (EUA).  This EUA will remain in effect (meaning this test can b e used) for the duration of  the COVID-19 declaration under Section 564(b)(1) of the Act, 21 U.S.C. section 360bbb-3(b)(1), unless the authorization is terminated or revoked sooner.     Influenza A by PCR NEGATIVE NEGATIVE Final   Influenza B by PCR NEGATIVE NEGATIVE Final    Comment: (NOTE) The Xpert Xpress SARS-CoV-2/FLU/RSV plus assay is intended as an aid in the diagnosis of influenza from Nasopharyngeal swab specimens and should not be used as a sole basis for treatment. Nasal washings and aspirates are unacceptable for Xpert Xpress SARS-CoV-2/FLU/RSV testing.  Fact Sheet for  Patients: BloggerCourse.com  Fact Sheet for Healthcare Providers: SeriousBroker.it  This test is not yet approved or cleared by the [place] FDA and has been authorized for detection and/or diagnosis of SARS-CoV-2 by FDA under an Emergency Use Authorization (EUA). This EUA will remain in effect (meaning this test can be used) for the duration of the COVID-19 declaration under Section 564(b)(1) of the Act, 21 U.S.C. section 360bbb-3(b)(1), unless the authorization is terminated or revoked.  Performed at [facility], [address] [place], [place] [address]     Labs: CBC: Recent Labs  Lab 07/19/23 1935 07/20/23 1206  WBC 15.8* 13.2*  NEUTROABS 13.3*  --   HGB 13.5 11.8*  HCT 41.3 35.2*  MCV 93.0 94.6  PLT 219 175   Basic Metabolic Panel: Recent Labs  Lab 07/19/23 1935 07/20/23 1206  NA 134* 135  K 3.6 3.3*  CL 99 102  CO2 20* 23  GLUCOSE 152* 126*  BUN 15 15  CREATININE 0.73 0.61  CALCIUM 8.3* 7.8*   Liver Function Tests: No results for input(s): "AST", "ALT", "ALKPHOS", "BILITOT", "PROT", "ALBUMIN" in the last 168 hours. CBG: No results for input(s): "GLUCAP" in the last 168 hours.  Discharge time spent: less than 30 minutes.  Signed: Brendia Sacks, MD [facility] 07/22/2023

## 2023-07-24 ENCOUNTER — Telehealth: Payer: Self-pay

## 2023-07-24 NOTE — Transitions of Care (Post Inpatient/ED Visit) (Signed)
07/24/2023  Name: Susan [name] MRN: [id number] DOB: Jan 25, 1924  Today's TOC FU Call Status:    Transition Care Management Follow-up Telephone Call Date of Discharge: 07/22/23 Discharge Facility[facility]) Type of Discharge: Inpatient Admission Primary Inpatient Discharge Diagnosis:: cellulits How have you been since you were released from the hospital?: Better Any questions or concerns?: No  Items Reviewed: Did you receive and understand the discharge instructions provided?: Yes Any new allergies since your discharge?: No Dietary orders reviewed?: NA Do you have support at home?: Yes People in Home: child(ren), adult  Medications Reviewed Today: Medications Reviewed Today     Reviewed by Larey Dresser, RN (Registered Nurse) on 07/24/23 at 1550  Med List Status: <None>   Medication Order Taking? Sig Documenting Provider Last Dose Status Informant  acetaminophen (TYLENOL) 650 MG CR tablet [id number] No Take 650 mg by mouth every 8 (eight) hours as needed for pain. [provider] unknown Active Multiple Informants  ALPRAZolam (XANAX) 0.5 MG tablet [id number] No Take 1 tablet (0.5 mg total) by mouth daily as needed for anxiety. Anne Ng, NP Unknown Active   aspirin EC 325 MG tablet [id number] No Take 325 mg by mouth daily. [provider] unknown Active Multiple Informants           Med Note Deloria Lair, DUROJAHYE' R   Fri Jul 20, 2023 12:52 AM)    cefadroxil (DURICEF) 500 MG capsule [id number]  Take 2 capsules (1,000 mg total) by mouth 2 (two) times daily. Standley Brooking, MD  Active   cholestyramine Lanetta Inch) 4 g packet [id number] No DISSOLVE & TAKE 1 PACKET BY MOUTH TWICE DAILY Hilarie Fredrickson, MD 07/18/2023 Active   dicyclomine (BENTYL) 10 MG capsule [id number] No Take 1 capsule by mouth twice daily Nche, Bonna Gains, NP 07/18/2023 Active   diphenoxylate-atropine (LOMOTIL) 2.5-0.025 MG tablet [id number] No TAKE 1 TABLET BY MOUTH  TWICE DAILY AS NEEDED FOR DIARRHEA OR LOOSE STOOLS Hilarie Fredrickson, MD 07/19/2023 Active   DULoxetine (CYMBALTA) 20 MG capsule [id number] No Take 1 capsule by mouth once daily Judi Saa, DO 07/18/2023 Active   famotidine (PEPCID) 20 MG tablet [id number] No Take 1 tablet by mouth once daily Nche, Bonna Gains, NP 07/18/2023 Active   fexofenadine (ALLEGRA) 180 MG tablet [id number] No Take 180 mg by mouth daily. [provider] 07/18/2023 Active Multiple Informants  fluticasone (FLONASE) 50 MCG/ACT nasal spray [id number] No Use 1 spray(s) in each nostril twice daily Nche, Bonna Gains, NP 07/18/2023 Active   furosemide (LASIX) 20 MG tablet [id number] No Take 1 tablet by mouth once daily Nche, Bonna Gains, NP 07/18/2023 Active   IRON PO [id number] No Take 65 mg by mouth 3 (three) times a week. [provider] 07/18/2023 Active Multiple Informants  losartan (COZAAR) 50 MG tablet [id number] No Take 2 tablets (100 mg total) by mouth daily. Anne Ng, NP 07/18/2023 Active   metoprolol succinate (TOPROL-XL) 25 MG 24 hr tablet [id number] No Take 1 tablet (25 mg total) by mouth daily. Anne Ng, NP 07/18/2023 Active   mupirocin ointment (BACTROBAN) 2 % [id number] No Apply 1 Application topically 2 (two) times daily. Apply with dressing change Nche, Bonna Gains, NP 07/18/2023 Active   potassium chloride SA (KLOR-CON M) 20 MEQ tablet [id number] No Take 1 tablet (20 mEq total) by mouth daily. Anne Ng, NP 07/18/2023 Active   sodium chloride (OCEAN) 0.65 % SOLN nasal spray [id number] No Place 1 spray into both nostrils as  needed for congestion. Anne Ng, NP 07/18/2023 Active   Vitamin D, Ergocalciferol, (DRISDOL) 1.25 MG (50000 UNIT) CAPS capsule [id number] No Take 1 capsule by mouth once a week  Patient not taking: Reported on 07/20/2023   Judi Saa, DO Not Taking Active             Home Care and Equipment/Supplies: Were Home Health Services Ordered?:  NA Any new equipment or medical supplies ordered?: NA  Functional Questionnaire: Do you need assistance with bathing/showering or dressing?: No Do you need assistance with meal preparation?: No Do you need assistance with eating?: No Do you have difficulty maintaining continence: No Do you need assistance with getting out of bed/getting out of a chair/moving?: No Do you have difficulty managing or taking your medications?: No  Follow up appointments reviewed: PCP Follow-up appointment confirmed?: Yes Date of PCP follow-up appointment?: 07/27/23 Follow-up Provider: [facility] Follow-up appointment confirmed?: No Do you need transportation to your follow-up appointment?: No Do you understand care options if your condition(s) worsen?: Yes-patient verbalized understanding    SIGNATURE Arvil Persons, BSN, RN

## 2023-07-27 ENCOUNTER — Encounter: Payer: Self-pay | Admitting: Nurse Practitioner

## 2023-07-27 ENCOUNTER — Telehealth: Payer: Self-pay

## 2023-07-27 ENCOUNTER — Ambulatory Visit: Payer: Medicare Other | Admitting: Nurse Practitioner

## 2023-07-27 VITALS — BP 110/60 | HR 55 | Temp 98.3°F | Resp 16 | Ht 60.0 in | Wt 106.0 lb

## 2023-07-27 DIAGNOSIS — R0789 Other chest pain: Secondary | ICD-10-CM

## 2023-07-27 DIAGNOSIS — R011 Cardiac murmur, unspecified: Secondary | ICD-10-CM

## 2023-07-27 DIAGNOSIS — E871 Hypo-osmolality and hyponatremia: Secondary | ICD-10-CM | POA: Diagnosis not present

## 2023-07-27 DIAGNOSIS — L03115 Cellulitis of right lower limb: Secondary | ICD-10-CM

## 2023-07-27 LAB — BASIC METABOLIC PANEL
BUN: 20 mg/dL (ref 6–23)
CO2: 29 mEq/L (ref 19–32)
Calcium: 8.7 mg/dL (ref 8.4–10.5)
Chloride: 104 mEq/L (ref 96–112)
Creatinine, Ser: 0.58 mg/dL (ref 0.40–1.20)
GFR: 76.25 mL/min (ref >60.00)
Glucose, Bld: 102 mg/dL — ABNORMAL HIGH (ref 70–99)
Potassium: 4.7 mEq/L (ref 3.5–5.1)
Sodium: 142 mEq/L (ref 135–145)

## 2023-07-27 LAB — CBC WITH DIFFERENTIAL/PLATELET
Basophils Absolute: 0.1 10*3/uL (ref 0.0–0.1)
Basophils Relative: 1.3 % (ref 0.0–3.0)
Eosinophils Absolute: 0.2 10*3/uL (ref 0.0–0.7)
Eosinophils Relative: 3.1 % (ref 0.0–5.0)
HCT: 38.3 % (ref 36.0–46.0)
Hemoglobin: 12.3 g/dL (ref 12.0–15.0)
Lymphocytes Relative: 23.6 % (ref 12.0–46.0)
Lymphs Abs: 1.5 10*3/uL (ref 0.7–4.0)
MCHC: 32.1 g/dL (ref 30.0–36.0)
MCV: 95.6 fl (ref 78.0–100.0)
Monocytes Absolute: 0.6 10*3/uL (ref 0.1–1.0)
Monocytes Relative: 9.1 % (ref 3.0–12.0)
Neutro Abs: 3.9 10*3/uL (ref 1.4–7.7)
Neutrophils Relative %: 62.9 % (ref 43.0–77.0)
Platelets: 308 10*3/uL (ref 150.0–400.0)
RBC: 4.01 Mil/uL (ref 3.87–5.11)
RDW: 14 % (ref 11.5–15.5)
WBC: 6.3 10*3/uL (ref 4.0–10.5)

## 2023-07-27 MED ORDER — AMOXICILLIN-POT CLAVULANATE 875-125 MG PO TABS
1.0000 | ORAL_TABLET | Freq: Two times a day (BID) | ORAL | 0 refills | Status: DC
Start: 2023-07-27 — End: 2023-08-15

## 2023-07-27 NOTE — Assessment & Plan Note (Signed)
-   Repeat BMP

## 2023-07-27 NOTE — Progress Notes (Unsigned)
Established Patient Visit  Patient: Susan [name]   DOB: [birth year]   89 y.o. Female  MRN: [id number] Visit Date: 07/27/2023  Subjective:    Chief Complaint  Patient presents with   Hospitalization Follow-up   HPI Tcm call completed 07/24/2023 [facility] to 07/22/2023 Ms. Vater was admitted to [facility] with right leg redness, swelling, pain and confusion. She was diagnosed with cellulitis. Treated with IV antibiotics. Discharge home with oral Abx. She Completed duricef 1000mg  BID x 5days. Negative venous doppler. Reviewed lab results and carotid duplex results. Today she reports improved but not completely resolved right LE redness and pain, thus unable to use compression stocking. She is able to eat and drink appropriate per [name]. She live in an Independent living apartment (staff comes to apartment if she does not ring red button every morning), has had life alert necklace, and her daughters-Linda and [name] check on her frequently. She denies any need for additional assistance. We reviewed and reconciled her medication list  With persistent right LE redness, increased warmth and pain; extended oral abx therapy for another 5days. Advised start probiotic supplement while on oral abx. Advised not to take oral abx with [place]. Repeat CBC and BMP F/up in 2weeks  Murmur, cardiac Chronic, she agreed to echocardiogram and cardiology referral to day. She also reports intermittent chest pressure, exacerbated by exertion. She is not sure about duration of symptoms, but states it has been more noticeable during recent hospitalization  Wt Readings from Last 3 Encounters:  07/27/23 106 lb (48.1 kg)  07/19/23 106 lb (48.1 kg)  07/11/23 106 lb (48.1 kg)    Reviewed medical, surgical, and social history today  Medications: Outpatient Medications Prior to Visit  Medication Sig   acetaminophen (TYLENOL) 650 MG  CR tablet Take 650 mg by mouth every 8 (eight) hours as needed for pain.   ALPRAZolam (XANAX) 0.5 MG tablet Take 1 tablet (0.5 mg total) by mouth daily as needed for anxiety.   aspirin EC 325 MG tablet Take 325 mg by mouth daily.   cholestyramine (QUESTRAN) 4 g packet DISSOLVE & TAKE 1 PACKET BY MOUTH TWICE DAILY   dicyclomine (BENTYL) 10 MG capsule Take 1 capsule by mouth twice daily   diphenoxylate-atropine (LOMOTIL) 2.5-0.025 MG tablet TAKE 1 TABLET BY MOUTH TWICE DAILY AS NEEDED FOR DIARRHEA OR LOOSE STOOLS   DULoxetine (CYMBALTA) 20 MG capsule Take 1 capsule by mouth once daily   famotidine (PEPCID) 20 MG tablet Take 1 tablet by mouth once daily   fexofenadine (ALLEGRA) 180 MG tablet Take 180 mg by mouth daily.   fluticasone (FLONASE) 50 MCG/ACT nasal spray Use 1 spray(s) in each nostril twice daily   furosemide (LASIX) 20 MG tablet Take 1 tablet by mouth once daily   IRON PO Take 65 mg by mouth 3 (three) times a week.   losartan (COZAAR) 50 MG tablet Take 2 tablets (100 mg total) by mouth daily.   metoprolol succinate (TOPROL-XL) 25 MG 24 hr tablet Take 1 tablet (25 mg total) by mouth daily.   mupirocin ointment (BACTROBAN) 2 % Apply 1 Application topically 2 (two) times daily. Apply with dressing change   potassium chloride SA (KLOR-CON M) 20 MEQ tablet Take 1 tablet (20 mEq total) by mouth daily.   sodium chloride (OCEAN) 0.65 % SOLN nasal spray Place 1 spray into both nostrils as needed for  congestion.   Vitamin D, Ergocalciferol, (DRISDOL) 1.25 MG (50000 UNIT) CAPS capsule Take 1 capsule by mouth once a week   [DISCONTINUED] cefadroxil (DURICEF) 500 MG capsule Take 2 capsules (1,000 mg total) by mouth 2 (two) times daily.   No facility-administered medications prior to visit.   Reviewed past medical and social history.   ROS per HPI above      Objective:  BP 110/60 (BP Location: Right Arm, Patient Position: Sitting, Cuff Size: Normal)   Pulse (!) 55   Temp 98.3 F (36.8 C)  (Temporal)   Resp 16   Ht 5' (1.524 m)   Wt 106 lb (48.1 kg)   SpO2 98%   BMI 20.70 kg/m      Physical Exam Vitals and nursing note reviewed.  Cardiovascular:     Rate and Rhythm: Normal rate and regular rhythm.     Pulses: Normal pulses.     Heart sounds: Murmur heard.  Pulmonary:     Effort: Pulmonary effort is normal.     Breath sounds: Normal breath sounds.  Musculoskeletal:        General: Tenderness present.     Right lower leg: Edema present.     Left lower leg: No edema.  Skin:    Findings: Erythema present.  Neurological:     Mental Status: She is alert and oriented to person, place, and time.  Psychiatric:        Mood and Affect: Mood normal.        Behavior: Behavior normal.        Thought Content: Thought content normal.     No results found for any visits on 07/27/23.    Assessment & Plan:    Problem List Items Addressed This Visit     Cellulitis - Primary    [facility] Ms. Cosman was admitted to [facility] with right leg redness, swelling, pain and confusion. She was diagnosed with cellulitis. Treated with IV antibiotics. Discharge home with oral Abx. She Completed duricef 1000mg  BID x 5days. Negative venous doppler. Reviewed lab results and carotid duplex results. Today she reports improved but not completely resolved right LE redness and pain, thus unable to use compression stocking. She is able to eat and drink appropriate per [name]. She live in an Independent living apartment (staff comes to apartment if she does not ring red button every morning), has had life alert necklace, and her daughters-Linda and [name] check on her frequently. She denies any need for additional assistance. We reviewed and reconciled her medication list  With persistent right LE redness, increased warmth and pain; extended oral abx therapy for another 5days. Advised start probiotic supplement while on oral abx. Advised  not to take oral abx with [place]. Repeat CBC and BMP F/up in 2weeks      Relevant Medications   amoxicillin-clavulanate (AUGMENTIN) 875-125 MG tablet   Other Relevant Orders   CBC with Differential/Platelet   Basic metabolic panel   Hyponatremia   Relevant Orders   Basic metabolic panel   Murmur, cardiac    Chronic, she agreed to echocardiogram and cardiology referral to day. She also reports intermittent chest pressure, exacerbated by exertion. She is not sure about duration of symptoms, but states it has been more noticeable during recent hospitalization      Relevant Orders   ECHOCARDIOGRAM COMPLETE   Ambulatory referral to Cardiology   Other Visit Diagnoses     Atypical chest pain  Relevant Orders   Ambulatory referral to Cardiology      Return in about 2 weeks (around 08/10/2023) for cellulitis.     Alysia Penna, NP

## 2023-07-27 NOTE — Patient Instructions (Signed)
Start augmentin 1tab BID. Do not take with [place] Take with food Start curturelle or florastor or align probiotics 1cap daily. Take separate from oral antibiotics. Elevate right leg as much as possible. Go to lab

## 2023-07-27 NOTE — Patient Instructions (Signed)
Visit Information  Thank you for taking time to visit with me today. Please don't hesitate to contact me if I can be of assistance to you.   Following are the goals we discussed today:   Goals Addressed             This Visit's Progress    COMPLETED: Care Coordination Activities-No follow up required       Care Coordination Interventions: Evaluation of current treatment plan related to Cellulitis and patient's adherence to plan as established by provider Discussed [facility] services and support. Assessed SDOH. Advised to discuss with primary care physician if services needed in the future.            If you are experiencing a Mental Health or Behavioral Health Crisis or need someone to talk to, please call the Suicide and Crisis Lifeline: 988   Patient verbalizes understanding of instructions and care plan provided today and agrees to view in MyChart. Active MyChart status and patient understanding of how to access instructions and care plan via MyChart confirmed with patient.     The patient has been provided with contact information for the care management team and has been advised to call with any health related questions or concerns.   Bary Leriche, RN, MSN [facility], [facility] Management Community Coordinator Direct Dial: [phone]  Fax: [phone] Website: [email or web address]

## 2023-07-27 NOTE — Assessment & Plan Note (Signed)
[facility] 07/22/2023 Ms. Crivelli was admitted to [facility] with right leg redness, swelling, pain and confusion. She was diagnosed with cellulitis. Treated with IV antibiotics. Discharge home with oral Abx. She Completed duricef 1000mg  BID x 5days. Negative venous doppler. Reviewed lab results and carotid duplex results. Today she reports improved but not completely resolved right LE redness and pain, thus unable to use compression stocking. She is able to eat and drink appropriate per [name]. She live in an Independent living apartment (staff comes to apartment if she does not ring red button every morning), has had life alert necklace, and her daughters-Linda and [name] check on her frequently. She denies any need for additional assistance. We reviewed and reconciled her medication list  With persistent right LE redness, increased warmth and pain; extended oral abx therapy for another 5days. Advised start probiotic supplement while on oral abx. Advised not to take oral abx with [place]. Repeat CBC and BMP F/up in 2weeks

## 2023-07-27 NOTE — Assessment & Plan Note (Addendum)
Chronic, she agreed to echocardiogram and cardiology referral to day. She also reports intermittent chest pressure, exacerbated by exertion. She is not sure about duration of symptoms, but states it has been more noticeable during recent hospitalization

## 2023-07-27 NOTE — Patient Outreach (Signed)
  Care Coordination   In Person Provider Office Visit Note   07/27/2023 Name: Susan [name] MRN: [id number] DOB: 11/02/1924  Susan [name] is a 89 y.o. year old female who sees Nche, Bonna Gains, NP for primary care. I engaged with Susan [name] in the providers office today.  What matters to the patients health and wellness today?  Getting better    Goals Addressed             This Visit's Progress    COMPLETED: Care Coordination Activities-No follow up required       Care Coordination Interventions: Evaluation of current treatment plan related to Cellulitis and patient's adherence to plan as established by provider Discussed Susan [name] services and support. Assessed SDOH. Advised to discuss with primary care physician if services needed in the future.          SDOH assessments and interventions completed:  Yes  SDOH Interventions Today    Flowsheet Row Most Recent Value  SDOH Interventions   Food Insecurity Interventions Intervention Not Indicated  Utilities Interventions Intervention Not Indicated        Care Coordination Interventions:  Yes, provided   Follow up plan: No further intervention required.   Encounter Outcome:  Pt. Visit Completed   Bernestine Holsapple Idelle Jo, RN, MSN [facility]  [facility], [facility] Management Community Coordinator Direct Dial: [phone]  Fax: [phone] Website: [email or web address]

## 2023-07-31 ENCOUNTER — Encounter: Payer: Self-pay | Admitting: Nurse Practitioner

## 2023-07-31 DIAGNOSIS — R0789 Other chest pain: Secondary | ICD-10-CM

## 2023-07-31 DIAGNOSIS — R011 Cardiac murmur, unspecified: Secondary | ICD-10-CM

## 2023-08-08 ENCOUNTER — Ambulatory Visit: Payer: Medicare Other | Admitting: Dermatology

## 2023-08-15 ENCOUNTER — Ambulatory Visit (INDEPENDENT_AMBULATORY_CARE_PROVIDER_SITE_OTHER): Payer: Medicare Other | Admitting: Nurse Practitioner

## 2023-08-15 ENCOUNTER — Ambulatory Visit: Payer: Medicare Other | Attending: Nurse Practitioner

## 2023-08-15 ENCOUNTER — Encounter: Payer: Self-pay | Admitting: Nurse Practitioner

## 2023-08-15 VITALS — BP 122/68 | HR 74 | Temp 98.0°F | Ht 60.0 in | Wt 104.8 lb

## 2023-08-15 DIAGNOSIS — R002 Palpitations: Secondary | ICD-10-CM

## 2023-08-15 DIAGNOSIS — I10 Essential (primary) hypertension: Secondary | ICD-10-CM | POA: Diagnosis not present

## 2023-08-15 DIAGNOSIS — R0789 Other chest pain: Secondary | ICD-10-CM

## 2023-08-15 DIAGNOSIS — L03115 Cellulitis of right lower limb: Secondary | ICD-10-CM

## 2023-08-15 NOTE — Assessment & Plan Note (Addendum)
Intermittent, associated with intermittent palpitation and SOB. No dizziness or syncope Unable to describe frequency. No specific trigger.  ECG: NSR with LVH and early repolarization in V1 and V2 leads. Entered order for vio patch She is scheduled for echocardiogram 08/21/2023 and appointment with cardiology 10/19/2023

## 2023-08-15 NOTE — Progress Notes (Signed)
Established Patient Visit  Patient: Susan [name]   DOB: 22-Jun-1924   89 y.o. Female  MRN: [id number] Visit Date: 08/15/2023  Subjective:    Chief Complaint  Patient presents with   Follow-up    Cellulitis and chest pain   HPI Cellulitis Resolved redness and increased warmth with augmentin. Residual bilateral ankle and foot swelling (chronic)  Essential hypertension BP at goal with losartan and metoprolol BP Readings from Last 3 Encounters:  08/15/23 122/68  07/27/23 110/60  07/22/23 137/69    Maintain med dose  Atypical chest pain Intermittent, associated with intermittent palpitation and SOB. No dizziness or syncope Unable to describe frequency. No specific trigger.  ECG: NSR with LVH and early repolarization in V1 and V2 leads. Entered order for vio patch She is scheduled for echocardiogram 08/21/2023 and appointment with cardiology 10/19/2023  Wt Readings from Last 3 Encounters:  08/15/23 104 lb 12.8 oz (47.5 kg)  07/27/23 106 lb (48.1 kg)  07/19/23 106 lb (48.1 kg)    Reviewed medical, surgical, and social history today  Medications: Outpatient Medications Prior to Visit  Medication Sig   acetaminophen (TYLENOL) 650 MG CR tablet Take 650 mg by mouth every 8 (eight) hours as needed for pain.   ALPRAZolam (XANAX) 0.5 MG tablet Take 1 tablet (0.5 mg total) by mouth daily as needed for anxiety.   aspirin EC 325 MG tablet Take 325 mg by mouth daily.   cholestyramine (QUESTRAN) 4 g packet DISSOLVE & TAKE 1 PACKET BY MOUTH TWICE DAILY   dicyclomine (BENTYL) 10 MG capsule Take 1 capsule by mouth twice daily   diphenoxylate-atropine (LOMOTIL) 2.5-0.025 MG tablet TAKE 1 TABLET BY MOUTH TWICE DAILY AS NEEDED FOR DIARRHEA OR LOOSE STOOLS   DULoxetine (CYMBALTA) 20 MG capsule Take 1 capsule by mouth once daily   famotidine (PEPCID) 20 MG tablet Take 1 tablet by mouth once daily   fexofenadine (ALLEGRA) 180 MG tablet Take 180 mg by mouth daily.    fluticasone (FLONASE) 50 MCG/ACT nasal spray Use 1 spray(s) in each nostril twice daily   furosemide (LASIX) 20 MG tablet Take 1 tablet by mouth once daily   IRON PO Take 65 mg by mouth 3 (three) times a week.   losartan (COZAAR) 50 MG tablet Take 2 tablets (100 mg total) by mouth daily.   metoprolol succinate (TOPROL-XL) 25 MG 24 hr tablet Take 1 tablet (25 mg total) by mouth daily.   mupirocin ointment (BACTROBAN) 2 % Apply 1 Application topically 2 (two) times daily. Apply with dressing change   potassium chloride SA (KLOR-CON M) 20 MEQ tablet Take 1 tablet (20 mEq total) by mouth daily.   sodium chloride (OCEAN) 0.65 % SOLN nasal spray Place 1 spray into both nostrils as needed for congestion.   Vitamin D, Ergocalciferol, (DRISDOL) 1.25 MG (50000 UNIT) CAPS capsule Take 1 capsule by mouth once a week   [DISCONTINUED] amoxicillin-clavulanate (AUGMENTIN) 875-125 MG tablet Take 1 tablet by mouth 2 (two) times daily.   No facility-administered medications prior to visit.   Reviewed past medical and social history.   ROS per HPI above     Latest Ref Rng & Units 07/27/2023   11:16 AM 07/20/2023   12:06 PM 07/19/2023    7:35 PM  CBC  WBC 4.0 - 10.5 K/uL 6.3  13.2  15.8   Hemoglobin 12.0 - 15.0 g/dL 47.8  29.5  13.5  Hematocrit 36.0 - 46.0 % 38.3  35.2  41.3   Platelets 150.0 - 400.0 K/uL 308.0  175  219        Latest Ref Rng & Units 07/27/2023   11:16 AM 07/20/2023   12:06 PM 07/19/2023    7:35 PM  BMP  Glucose 70 - 99 mg/dL 259  563  875   BUN 6 - 23 mg/dL 20  15  15    Creatinine 0.40 - 1.20 mg/dL 6.43  3.29  5.18   Sodium 135 - 145 mEq/L 142  135  134   Potassium 3.5 - 5.1 mEq/L 4.7  3.3  3.6   Chloride 96 - 112 mEq/L 104  102  99   CO2 19 - 32 mEq/L 29  23  20    Calcium 8.4 - 10.5 mg/dL 8.7  7.8  8.3        Objective:  BP 122/68   Pulse 74   Temp 98 F (36.7 C) (Temporal)   Ht 5' (1.524 m)   Wt 104 lb 12.8 oz (47.5 kg)   SpO2 96%   BMI 20.47 kg/m     ECG: NSR, LVH  with early repolarization Reviewed ECG with Dr. Veto Kemps  Physical Exam Vitals reviewed.  Cardiovascular:     Rate and Rhythm: Normal rate and regular rhythm.     Pulses: Normal pulses.     Heart sounds: Murmur heard.  Pulmonary:     Effort: Pulmonary effort is normal.     Breath sounds: Normal breath sounds.  Skin:    Findings: No erythema or rash.  Neurological:     Mental Status: She is alert and oriented to person, place, and time.     No results found for any visits on 08/15/23.    Assessment & Plan:    Problem List Items Addressed This Visit     Atypical chest pain - Primary    Intermittent, associated with intermittent palpitation and SOB. No dizziness or syncope Unable to describe frequency. No specific trigger.  ECG: NSR with LVH and early repolarization in V1 and V2 leads. Entered order for vio patch She is scheduled for echocardiogram 08/21/2023 and appointment with cardiology 10/19/2023      Relevant Orders   EKG 12-Lead (Completed)   Cellulitis    Resolved redness and increased warmth with augmentin. Residual bilateral ankle and foot swelling (chronic)      Essential hypertension    BP at goal with losartan and metoprolol BP Readings from Last 3 Encounters:  08/15/23 122/68  07/27/23 110/60  07/22/23 137/69    Maintain med dose      Intermittent palpitations   Relevant Orders   LONG TERM MONITOR (3-14 DAYS)   Return in about 6 months (around 02/12/2024) for HTN, hyperlipidemia (fasting).     Alysia Penna, NP

## 2023-08-15 NOTE — Assessment & Plan Note (Addendum)
Resolved redness and increased warmth with augmentin. Residual bilateral ankle and foot swelling (chronic)

## 2023-08-15 NOTE — Patient Instructions (Signed)
You will be contacted about holter monitor Maintain appointment for echocardiogram and with cardiology.  Palpitations Palpitations are feelings that your heartbeat is not normal. Your heartbeat may feel like it is: Uneven (irregular). Faster than normal. Fluttering. Skipping a beat. This is usually not a serious problem. However, a doctor will do tests and check your medical history to make sure that you do not have a serious heart problem. Follow these instructions at home: Watch for any changes in your condition. Tell your doctor about any changes. Take these actions to help manage your symptoms: Eating and drinking Follow instructions from your doctor about things to eat and drink. You may be told to avoid these things: Drinks that have caffeine in them, such as coffee, tea, soft drinks, and energy drinks. Chocolate. Alcohol. Diet pills. Lifestyle     Try to lower your stress. These things can help you relax: Yoga. Deep breathing and meditation. Guided imagery. This is using words and images to create positive thoughts. Exercise, including swimming, jogging, and walking. Tell your doctor if you have more abnormal heartbeats when you are active. If you have chest pain or feel short of breath with exercise, do not keep doing the exercise until you are seen by your doctor. Biofeedback. This is using your mind to control things in your body, such as your heartbeat. Get plenty of rest and sleep. Keep a regular bed time. Do not use drugs, such as cocaine or ecstasy. Do not use marijuana. Do not smoke or use any products that contain nicotine or tobacco. If you need help quitting, ask your doctor. General instructions Take over-the-counter and prescription medicines only as told by your doctor. Keep all follow-up visits. You may need more tests if palpitations do not go away or get worse. Contact a doctor if: You keep having fast or uneven heartbeats for a long time. Your symptoms  happen more often. Get help right away if: You have chest pain. You feel short of breath. You have a very bad headache. You feel dizzy. You faint. These symptoms may be an emergency. Get help right away. Call your local emergency services (911 in the [place] Do not wait to see if the symptoms will go away. Do not drive yourself to the hospital. Summary Palpitations are feelings that your heartbeat is uneven or faster than normal. It may feel like your heart is fluttering or skipping a beat. Avoid food and drinks that may cause this condition. These include caffeine, chocolate, and alcohol. Try to lower your stress. Do not smoke or use drugs. Get help right away if you faint, feel dizzy, feel short of breath, have chest pain, or have a very bad headache. This information is not intended to replace advice given to you by your health care provider. Make sure you discuss any questions you have with your health care provider. Document Revised: 04/13/2021 Document Reviewed: 04/13/2021 Elsevier Patient Education  2024 [employer].

## 2023-08-15 NOTE — Assessment & Plan Note (Signed)
BP at goal with losartan and metoprolol BP Readings from Last 3 Encounters:  08/15/23 122/68  07/27/23 110/60  07/22/23 137/69    Maintain med dose

## 2023-08-15 NOTE — Progress Notes (Unsigned)
Enrolled patient for a 14 day Zio XT monitor to be mailed to patients home  DOD to read

## 2023-08-17 ENCOUNTER — Other Ambulatory Visit: Payer: Self-pay | Admitting: Family Medicine

## 2023-08-21 ENCOUNTER — Ambulatory Visit (HOSPITAL_COMMUNITY): Payer: Medicare Other | Attending: Cardiology

## 2023-08-21 DIAGNOSIS — R011 Cardiac murmur, unspecified: Secondary | ICD-10-CM | POA: Diagnosis not present

## 2023-08-21 LAB — ECHOCARDIOGRAM COMPLETE
AR max vel: 0.9 cm2
AV Area VTI: 0.95 cm2
AV Area mean vel: 0.87 cm2
AV Mean grad: 46.7 mmHg
AV Peak grad: 80.4 mmHg
Ao pk vel: 4.48 m/s
Area-P 1/2: 2.14 cm2
MV M vel: 6.86 m/s
MV Peak grad: 188 mmHg
Radius: 0.6 cm
S' Lateral: 2.2 cm

## 2023-08-24 ENCOUNTER — Telehealth: Payer: Self-pay | Admitting: Nurse Practitioner

## 2023-08-24 NOTE — Telephone Encounter (Signed)
[name] stated she did not request to talk to me

## 2023-08-30 ENCOUNTER — Other Ambulatory Visit: Payer: Self-pay | Admitting: Internal Medicine

## 2023-08-31 ENCOUNTER — Telehealth: Payer: Self-pay | Admitting: Neurology

## 2023-08-31 NOTE — Telephone Encounter (Signed)
Pt cancelled appt due to scheduling conflict. 

## 2023-09-03 ENCOUNTER — Ambulatory Visit: Payer: Medicare Other | Admitting: Neurology

## 2023-09-11 DIAGNOSIS — D485 Neoplasm of uncertain behavior of skin: Secondary | ICD-10-CM | POA: Diagnosis not present

## 2023-09-11 DIAGNOSIS — L570 Actinic keratosis: Secondary | ICD-10-CM | POA: Diagnosis not present

## 2023-09-11 DIAGNOSIS — L98498 Non-pressure chronic ulcer of skin of other sites with other specified severity: Secondary | ICD-10-CM | POA: Diagnosis not present

## 2023-09-11 NOTE — Progress Notes (Unsigned)
[name] [address] [place] [address] Phone: [phone] Subjective:   Susan [name], am serving as a scribe for Dr. Antoine Primas.  I'm seeing this patient by the request  of:  Nche, Bonna Gains, NP  CC: bilateral knee pain   [id number]  07/11/2023 Lateral injections given today, tolerated the procedure well, discussed icing regimen and home exercises, discussed which activities to do and which ones to avoid. Increase activity slowly. Follow-up with me again in 3 months otherwise. Due to patient's age and comorbidities is not a surgical candidate. Patient does ambulate with the aid of a walker and social determinant of health as patient is not significantly active.   Updated 09/12/2023 Susan [name] is a 89 y.o. female coming in with complaint of B knee pain. Patient states she did have some relief from previous injections.        Past Medical History:  Diagnosis Date   Actinic keratosis, hx of    chest wall (2012)   Allergic rhinitis    Anxiety    Arthritis    "spine, hips" (01/30/2017)   Bursitis    Cellulitis of right foot 01/30/2017   Colon polyps    Diverticulosis    Esophageal dysmotilities 10/16/2017   Fibromyalgia    "qwhere" (01/30/2017)   Gallstones    GERD (gastroesophageal reflux disease)    High cholesterol    History of kidney stones    History of stomach ulcers    Hx of squamous cell carcinoma excision    right mandible (2011), right arm (2014)   Hypertension    IBS (irritable bowel syndrome)    Lymphangitis 12/06/2018   Osteoarthritis    Pneumonia 2000s X 2   "twice" (01/30/2017)   Pyloric stenosis    Stroke (HCC) 10/2014   denies residual on 01/30/2017   Past Surgical History:  Procedure Laterality Date   APPENDECTOMY     CARDIOVASCULAR STRESS TEST  04/2016   nuclear cardiolite stress test done in [place] (no ischemia, no LVH, no LV systolic function)   CATARACT EXTRACTION W/ INTRAOCULAR  LENS  IMPLANT, BILATERAL Bilateral    CHOLECYSTECTOMY OPEN     COLONOSCOPY  2009, 2014   DIAGNOSTIC MAMMOGRAM  2009, 2012   TONSILLECTOMY     Social History   Socioeconomic History   Marital status: Widowed    Spouse name: Not on file   Number of children: 4   Years of education: Not on file   Highest education level: Not on file  Occupational History   Occupation: retired  Tobacco Use   Smoking status: Never   Smokeless tobacco: Never  Vaping Use   Vaping status: Never Used  Substance and Sexual Activity   Alcohol use: Not Currently    Comment: glass of red wine maybe once week   Drug use: No   Sexual activity: Not on file  Other Topics Concern   Not on file  Social History Narrative   Not on file   Social Determinants of Health   Financial Resource Strain: Low Risk  (05/25/2023)   Overall Financial Resource Strain (CARDIA)    Difficulty of Paying Living Expenses: Not hard at all  Food Insecurity: No Food Insecurity (07/27/2023)   Hunger Vital Sign    Worried About Running Out of Food in the Last Year: Never true    Ran Out of Food in the Last Year: Never true  Transportation Needs: No Transportation Needs (07/20/2023)  PRAPARE - Administrator, Civil Service (Medical): No    Lack of Transportation (Non-Medical): No  Physical Activity: Inactive (05/25/2023)   Exercise Vital Sign    Days of Exercise per Week: 0 days    Minutes of Exercise per Session: 0 min  Stress: No Stress Concern Present (05/25/2023)   [name] [employer] - Occupational Stress Questionnaire    Feeling of Stress : Only a little  Social Connections: Patient Declined (05/25/2023)   Social Connection and Isolation Panel [NHANES]    Frequency of Communication with Friends and Family: Patient declined    Frequency of Social Gatherings with Friends and Family: Patient declined    Attends Religious Services: Patient declined    Database administrator or Organizations:  Patient declined    Attends Engineer, structural: Patient declined    Marital Status: Patient declined   Allergies  Allergen Reactions   Butrans [Buprenorphine] Other (See Comments)    Severe chest pains   Codeine Other (See Comments)    Severe Chest pains  Other Reaction(s): severe chest pain   Morphine And Codeine Other (See Comments)    Severe chest pains   Morphine Sulfate     Other Reaction(s): severe chest pain    Family History  Problem Relation Age of Onset   Stroke Mother    Heart attack Mother    Stroke Father    Heart attack Father    Heart attack Maternal Grandmother    Stroke Maternal Grandmother    Stroke Paternal Grandmother    Heart attack Paternal Grandmother    Heart attack Maternal Grandfather    Stroke Maternal Grandfather    Stroke Paternal Grandfather    Heart attack Paternal Grandfather    Colon cancer Neg Hx    Pancreatic cancer Neg Hx    Stomach cancer Neg Hx    Esophageal cancer Neg Hx      Current Outpatient Medications (Cardiovascular):    cholestyramine (QUESTRAN) 4 g packet, DISSOLVE & TAKE 1 PACKET BY MOUTH TWICE DAILY   furosemide (LASIX) 20 MG tablet, Take 1 tablet by mouth once daily   losartan (COZAAR) 50 MG tablet, Take 2 tablets (100 mg total) by mouth daily.   metoprolol succinate (TOPROL-XL) 25 MG 24 hr tablet, Take 1 tablet (25 mg total) by mouth daily.  Current Outpatient Medications (Respiratory):    fexofenadine (ALLEGRA) 180 MG tablet, Take 180 mg by mouth daily.   fluticasone (FLONASE) 50 MCG/ACT nasal spray, Use 1 spray(s) in each nostril twice daily   sodium chloride (OCEAN) 0.65 % SOLN nasal spray, Place 1 spray into both nostrils as needed for congestion.  Current Outpatient Medications (Analgesics):    acetaminophen (TYLENOL) 650 MG CR tablet, Take 650 mg by mouth every 8 (eight) hours as needed for pain.   aspirin EC 325 MG tablet, Take 325 mg by mouth daily.  Current Outpatient Medications  (Hematological):    IRON PO, Take 65 mg by mouth 3 (three) times a week.  Current Outpatient Medications (Other):    ALPRAZolam (XANAX) 0.5 MG tablet, Take 1 tablet (0.5 mg total) by mouth daily as needed for anxiety.   dicyclomine (BENTYL) 10 MG capsule, Take 1 capsule by mouth twice daily   diphenoxylate-atropine (LOMOTIL) 2.5-0.025 MG tablet, TAKE 1 TABLET BY MOUTH TWICE DAILY AS NEEDED FOR DIARRHEA OR LOOSE STOOLS   DULoxetine (CYMBALTA) 20 MG capsule, Take 1 capsule by mouth once daily   famotidine (PEPCID) 20  MG tablet, Take 1 tablet by mouth once daily   mupirocin ointment (BACTROBAN) 2 %, Apply 1 Application topically 2 (two) times daily. Apply with dressing change   potassium chloride SA (KLOR-CON M) 20 MEQ tablet, Take 1 tablet (20 mEq total) by mouth daily.   Vitamin D, Ergocalciferol, (DRISDOL) 1.25 MG (50000 UNIT) CAPS capsule, Take 1 capsule by mouth once a week   Reviewed prior external information including notes and imaging from  primary care provider As well as notes that were available from care everywhere and other healthcare systems.  Past medical history, social, surgical and family history all reviewed in electronic medical record.  No pertanent information unless stated regarding to the chief complaint.   Review of Systems:  No headache, visual changes, nausea, vomiting, diarrhea, constipation, dizziness, abdominal pain, skin rash, fevers, chills, night sweats, weight loss, swollen lymph nodes, body aches, joint swelling, chest pain, shortness of breath, mood changes. POSITIVE muscle aches  Objective  Blood pressure (!) 142/82, pulse 80, height 5' (1.524 m), SpO2 97%.   General: No apparent distress alert and oriented x3 mood and affect normal, dressed appropriately.  HEENT: Pupils equal, extraocular movements intact  Respiratory: Patient's speak in full sentences and does not appear short of breath  Cardiovascular: No lower extremity edema, non tender, no  erythema  Bilateral knee exam shows does have some instability noted of the knees bilaterally. Using the aid of a rolling walker.     After informed written and verbal consent, patient was seated on exam table. Right knee was prepped with alcohol swab and utilizing anterolateral approach, patient's right knee space was injected with 4:1  marcaine 0.5%: Kenalog 40mg /dL. Patient tolerated the procedure well without immediate complications.  After informed written and verbal consent, patient was seated on exam table. Left knee was prepped with alcohol swab and utilizing anterolateral approach, patient's left knee space was injected with 4:1  marcaine 0.5%: Kenalog 40mg /dL. Patient tolerated the procedure well without immediate complications.   Impression and Recommendations:    The above documentation has been reviewed and is accurate and complete Judi Saa, DO

## 2023-09-12 ENCOUNTER — Ambulatory Visit: Payer: Medicare Other | Admitting: Family Medicine

## 2023-09-12 ENCOUNTER — Encounter: Payer: Self-pay | Admitting: Family Medicine

## 2023-09-12 VITALS — BP 142/82 | HR 80 | Ht 60.0 in

## 2023-09-12 DIAGNOSIS — M170 Bilateral primary osteoarthritis of knee: Secondary | ICD-10-CM

## 2023-09-12 NOTE — Patient Instructions (Signed)
Great to see you  You are amazing  See me again in 3 months

## 2023-09-12 NOTE — Assessment & Plan Note (Signed)
Chronic problem with exacerbations.  Discussed with patient on icing regimen and home exercise, which are to do and which ones to avoid.  Increase activity slowly.  Due to patient's age she is not a surgical candidate.  Has been responding to injections every 3 months if needed.

## 2023-09-24 ENCOUNTER — Ambulatory Visit: Payer: Medicare Other | Admitting: Dermatology

## 2023-09-27 DIAGNOSIS — L570 Actinic keratosis: Secondary | ICD-10-CM | POA: Diagnosis not present

## 2023-10-03 ENCOUNTER — Other Ambulatory Visit: Payer: Self-pay | Admitting: Internal Medicine

## 2023-10-03 ENCOUNTER — Other Ambulatory Visit: Payer: Self-pay | Admitting: Nurse Practitioner

## 2023-10-03 ENCOUNTER — Other Ambulatory Visit: Payer: Self-pay | Admitting: Family Medicine

## 2023-10-03 DIAGNOSIS — I872 Venous insufficiency (chronic) (peripheral): Secondary | ICD-10-CM

## 2023-10-03 DIAGNOSIS — K529 Noninfective gastroenteritis and colitis, unspecified: Secondary | ICD-10-CM

## 2023-10-03 DIAGNOSIS — I10 Essential (primary) hypertension: Secondary | ICD-10-CM

## 2023-10-12 ENCOUNTER — Other Ambulatory Visit: Payer: Self-pay | Admitting: Nurse Practitioner

## 2023-10-12 DIAGNOSIS — F411 Generalized anxiety disorder: Secondary | ICD-10-CM

## 2023-10-12 DIAGNOSIS — K2100 Gastro-esophageal reflux disease with esophagitis, without bleeding: Secondary | ICD-10-CM

## 2023-10-19 ENCOUNTER — Encounter (HOSPITAL_BASED_OUTPATIENT_CLINIC_OR_DEPARTMENT_OTHER): Payer: Self-pay | Admitting: Cardiology

## 2023-10-19 ENCOUNTER — Ambulatory Visit (HOSPITAL_BASED_OUTPATIENT_CLINIC_OR_DEPARTMENT_OTHER): Payer: Medicare Other | Admitting: Cardiology

## 2023-10-19 VITALS — BP 132/72 | HR 70 | Ht 60.0 in | Wt 104.7 lb

## 2023-10-19 DIAGNOSIS — R011 Cardiac murmur, unspecified: Secondary | ICD-10-CM

## 2023-10-19 DIAGNOSIS — I071 Rheumatic tricuspid insufficiency: Secondary | ICD-10-CM

## 2023-10-19 DIAGNOSIS — R0789 Other chest pain: Secondary | ICD-10-CM

## 2023-10-19 DIAGNOSIS — I340 Nonrheumatic mitral (valve) insufficiency: Secondary | ICD-10-CM | POA: Diagnosis not present

## 2023-10-19 DIAGNOSIS — Z712 Person consulting for explanation of examination or test findings: Secondary | ICD-10-CM | POA: Diagnosis not present

## 2023-10-19 NOTE — Patient Instructions (Signed)
Medication Instructions:  ?Your physician recommends that you continue on your current medications as directed. Please refer to the Current Medication list given to you today.  ? ?Labwork: ?NONE ? ?Testing/Procedures: ?NONE ? ?Follow-Up: ?AS NEEDED  ? ?  ?

## 2023-10-19 NOTE — Progress Notes (Signed)
  Cardiology Office Note:  .   Date:  10/19/2023  ID:  Susan [name], DOB 1924-03-02, MRN [id number] PCP: Anne Ng, NP  [facility] Providers Cardiologist:  Jodelle Red, MD {  History of Present Illness: .   Susan [name] is a 89 y.o. female with PMH hypertension, chronic venous insufficiency referred to cardiology for evaluation of murmur and chest pain.  Pertinent CV history: Echo 08/21/23 shows EF 55-60%, Moderate MR, moderate TR, no AS. Stress test in 2017 unremarkable.  Today: Here with her daughter today. Hard of hearing, doesn't have her batteries in her hearing aid.  Reviewed ER notes and PCP notes today, as well as hospital notes from her admission 07/19/23.  Overall has felt improved since her illness in August. Symptoms are less frequent and less bothersome. Noted chest pains last week, lasted several hours. First pain is sharp, central chest, nonradiating, nonexertional, no associated symptoms. Second pain is focal, posterior left chest wall, sharp/tight. Always in the same place. No other associated symptoms. Both are rare and self limited.  Reviewed echo results today.  ROS: Denies chest pain, shortness of breath at rest or with normal exertion. No PND, orthopnea, LE edema or unexpected weight gain. No syncope or palpitations. ROS otherwise negative except as noted.   Studies Reviewed: Marland Kitchen    EKG:       Physical Exam:   VS:  BP 132/72   Pulse 70   Ht 5' (1.524 m)   Wt 104 lb 11.2 oz (47.5 kg)   SpO2 94%   BMI 20.45 kg/m    Wt Readings from Last 3 Encounters:  10/19/23 104 lb 11.2 oz (47.5 kg)  08/15/23 104 lb 12.8 oz (47.5 kg)  07/27/23 106 lb (48.1 kg)    GEN: Well nourished, well developed in no acute distress HEENT: Normal, moist mucous membranes NECK: No JVD CARDIAC: regular rhythm, normal S1 and S2, no rubs or gallops. 2-3/6 murmur LLSB VASCULAR: Radial and DP pulses 2+ bilaterally. No carotid bruits RESPIRATORY:   Clear to auscultation without rales, wheezing or rhonchi  ABDOMEN: Soft, non-tender, non-distended MUSCULOSKELETAL:  Ambulates independently SKIN: Warm and dry, no edema NEUROLOGIC:  Alert and oriented x 3. No focal neuro deficits noted. PSYCHIATRIC:  Normal affect    ASSESSMENT AND PLAN: .    Atypical chest pain -focal, sharp, self limited. Not consistent with cardiac etiology -reviewed red flag signs that need immediate medical attention  Murmur -moderate TR, moderate MR on echo. Reviewed test results today. -no symptoms of volume overload -would repeat echo only for change in symptoms  Dispo: as needed  Signed, Jodelle Red, MD   Jodelle Red, MD, PhD, [facility] [place]  [facility]  [place]  [facility] at [facility] at [facility] [address], [address] [place], [place] [address] [phone]

## 2023-12-06 NOTE — Progress Notes (Signed)
 [name] [address] [place] [address] Phone: [phone] Subjective:   Susan [name], am serving as a scribe for Dr. Arthea Claudene.  I'm seeing this patient by the request  of:  Nche, Roselie Rockford, NP  CC: knee pain f/u   YEP:Dlagzrupcz  09/12/2023 Chronic problem with exacerbations. Discussed with patient on icing regimen and home exercise, which are to do and which ones to avoid. Increase activity slowly. Due to patient's age she is not a surgical candidate. Has been responding to injections every 3 months if needed.   Updated 12/11/2023 Susan [name] is a 89 y.o. female coming in with complaint of B knee pain is pretty bad, ready for injections        Past Medical History:  Diagnosis Date   Actinic keratosis, hx of    chest wall (2012)   Allergic rhinitis    Anxiety    Arthritis    spine, hips (01/30/2017)   Bursitis    Cellulitis of right foot 01/30/2017   Colon polyps    Diverticulosis    Esophageal dysmotilities 10/16/2017   Fibromyalgia    qwhere (01/30/2017)   Gallstones    GERD (gastroesophageal reflux disease)    High cholesterol    History of kidney stones    History of stomach ulcers    Hx of squamous cell carcinoma excision    right mandible (2011), right arm (2014)   Hypertension    IBS (irritable bowel syndrome)    Lymphangitis 12/06/2018   Osteoarthritis    Pneumonia 2000s X 2   twice (01/30/2017)   Pyloric stenosis    Stroke (HCC) 10/2014   denies residual on 01/30/2017   Past Surgical History:  Procedure Laterality Date   APPENDECTOMY     CARDIOVASCULAR STRESS TEST  04/2016   nuclear cardiolite stress test done in [place]  (no ischemia, no LVH, no LV systolic function)   CATARACT EXTRACTION W/ INTRAOCULAR LENS  IMPLANT, BILATERAL Bilateral    CHOLECYSTECTOMY OPEN     COLONOSCOPY  2009, 2014   DIAGNOSTIC MAMMOGRAM  2009, 2012   TONSILLECTOMY     Social History   Socioeconomic  History   Marital status: Widowed    Spouse name: Not on file   Number of children: 4   Years of education: Not on file   Highest education level: Not on file  Occupational History   Occupation: retired  Tobacco Use   Smoking status: Never   Smokeless tobacco: Never  Vaping Use   Vaping status: Never Used  Substance and Sexual Activity   Alcohol use: Not Currently    Comment: glass of red wine maybe once week   Drug use: No   Sexual activity: Not on file  Other Topics Concern   Not on file  Social History Narrative   Not on file   Social Drivers of Health   Financial Resource Strain: Low Risk  (05/25/2023)   Overall Financial Resource Strain (CARDIA)    Difficulty of Paying Living Expenses: Not hard at all  Food Insecurity: No Food Insecurity (07/27/2023)   Hunger Vital Sign    Worried About Running Out of Food in the Last Year: Never true    Ran Out of Food in the Last Year: Never true  Transportation Needs: No Transportation Needs (07/20/2023)   PRAPARE - Administrator, Civil Service (Medical): No    Lack of Transportation (Non-Medical): No  Physical Activity: Inactive (05/25/2023)  Exercise Vital Sign    Days of Exercise per Week: 0 days    Minutes of Exercise per Session: 0 min  Stress: No Stress Concern Present (05/25/2023)   [employer] of Occupational Health - Occupational Stress Questionnaire    Feeling of Stress : Only a little  Social Connections: Patient Declined (05/25/2023)   Social Connection and Isolation Panel [NHANES]    Frequency of Communication with Friends and Family: Patient declined    Frequency of Social Gatherings with Friends and Family: Patient declined    Attends Religious Services: Patient declined    Database Administrator or Organizations: Patient declined    Attends Engineer, Structural: Patient declined    Marital Status: Patient declined   Allergies  Allergen Reactions   Butrans [Buprenorphine] Other (See  Comments)    Severe chest pains   Codeine Other (See Comments)    Severe Chest pains  Other Reaction(s): severe chest pain   Morphine And Codeine Other (See Comments)    Severe chest pains   Morphine Sulfate     Other Reaction(s): severe chest pain    Family History  Problem Relation Age of Onset   Stroke Mother    Heart attack Mother    Stroke Father    Heart attack Father    Heart attack Maternal Grandmother    Stroke Maternal Grandmother    Stroke Paternal Grandmother    Heart attack Paternal Grandmother    Heart attack Maternal Grandfather    Stroke Maternal Grandfather    Stroke Paternal Grandfather    Heart attack Paternal Grandfather    Colon cancer Neg Hx    Pancreatic cancer Neg Hx    Stomach cancer Neg Hx    Esophageal cancer Neg Hx      Current Outpatient Medications (Cardiovascular):    cholestyramine  (QUESTRAN ) 4 g packet, DISSOLVE & TAKE 1 PACKET BY MOUTH TWICE DAILY   furosemide  (LASIX ) 20 MG tablet, Take 1 tablet by mouth once daily   losartan  (COZAAR ) 50 MG tablet, Take 2 tablets (100 mg total) by mouth daily.   metoprolol  succinate (TOPROL -XL) 25 MG 24 hr tablet, Take 1 tablet (25 mg total) by mouth daily.  Current Outpatient Medications (Respiratory):    fexofenadine (ALLEGRA) 180 MG tablet, Take 180 mg by mouth daily.   fluticasone  (FLONASE ) 50 MCG/ACT nasal spray, Use 1 spray(s) in each nostril twice daily   sodium chloride  (OCEAN) 0.65 % SOLN nasal spray, Place 1 spray into both nostrils as needed for congestion.  Current Outpatient Medications (Analgesics):    acetaminophen  (TYLENOL ) 650 MG CR tablet, Take 650 mg by mouth every 8 (eight) hours as needed for pain.   aspirin  EC 325 MG tablet, Take 325 mg by mouth daily.  Current Outpatient Medications (Hematological):    IRON PO, Take 65 mg by mouth 3 (three) times a week.  Current Outpatient Medications (Other):    ALPRAZolam  (XANAX ) 0.5 MG tablet, TAKE 1 TABLET BY MOUTH ONCE DAILY AS NEEDED  FOR ANXIETY   dicyclomine  (BENTYL ) 10 MG capsule, Take 1 capsule by mouth twice daily   diphenoxylate -atropine  (LOMOTIL ) 2.5-0.025 MG tablet, TAKE 1 TABLET BY MOUTH TWICE DAILY AS NEEDED FOR DIARRHEA OR LOOSE STOOLS   DULoxetine  (CYMBALTA ) 20 MG capsule, Take 1 capsule by mouth once daily   famotidine  (PEPCID ) 20 MG tablet, Take 1 tablet by mouth once daily   mupirocin  ointment (BACTROBAN ) 2 %, Apply 1 Application topically 2 (two) times daily. Apply with dressing change  potassium chloride  SA (KLOR-CON  M) 20 MEQ tablet, Take 1 tablet (20 mEq total) by mouth daily.   Vitamin D , Ergocalciferol , (DRISDOL ) 1.25 MG (50000 UNIT) CAPS capsule, Take 1 capsule by mouth once a week   Reviewed prior external information including notes and imaging from  primary care provider As well as notes that were available from care everywhere and other healthcare systems.  Past medical history, social, surgical and family history all reviewed in electronic medical record.  No pertanent information unless stated regarding to the chief complaint.   Review of Systems:  No headache, visual changes, nausea, vomiting, diarrhea, constipation, dizziness, abdominal pain, skin rash, fevers, chills, night sweats, weight loss, swollen lymph nodes, body aches, joint swelling, chest pain, shortness of breath, mood changes. POSITIVE muscle aches  Objective  Blood pressure 118/82, pulse 64, height 5' (1.524 m), SpO2 93%.   General: No apparent distress alert and oriented x3 mood and affect normal, dressed appropriately.  HEENT: Pupils equal, extraocular movements intact  Ambulates with the aid of a walker.  Dorsalis pedis pulse +1 but symmetric Knee exam shows arthritic changes noted.  Some instability noted bilaterally.  Trace effusion noted on the left.   After informed written and verbal consent, patient was seated on exam table. Right knee was prepped with alcohol swab and utilizing anterolateral approach, patient's  right knee space was injected with 4:1  marcaine 0.5%: Kenalog  40mg /dL. Patient tolerated the procedure well without immediate complications.  After informed written and verbal consent, patient was seated on exam table. Left knee was prepped with alcohol swab and utilizing anterolateral approach, patient's left knee space was injected with 4:1  marcaine 0.5%: Kenalog  40mg /dL. Patient tolerated the procedure well without immediate complications.  Impression and Recommendations:     The above documentation has been reviewed and is accurate and complete Mccall Lomax M Vera Wishart, DO

## 2023-12-12 ENCOUNTER — Ambulatory Visit: Payer: Medicare Other | Admitting: Family Medicine

## 2023-12-12 ENCOUNTER — Encounter: Payer: Self-pay | Admitting: Family Medicine

## 2023-12-12 VITALS — BP 118/82 | HR 64 | Ht 60.0 in

## 2023-12-12 DIAGNOSIS — M170 Bilateral primary osteoarthritis of knee: Secondary | ICD-10-CM | POA: Diagnosis not present

## 2023-12-12 NOTE — Assessment & Plan Note (Signed)
 Chronic problem with exacerbation.  Secondary to comorbidities and patient's age she is not a surgical candidate.  Does have fairly significant arthritic changes.  Increase activity slowly.  Continue to use the walker.  Follow-up with me again 3 months

## 2023-12-12 NOTE — Patient Instructions (Signed)
 Good to see you Injections in both knees today Let me know how your back is doing monday and we can order the back injections  Follow up in 2-3 months

## 2023-12-13 ENCOUNTER — Other Ambulatory Visit: Payer: Self-pay | Admitting: Family Medicine

## 2023-12-17 ENCOUNTER — Telehealth: Payer: Self-pay | Admitting: Family Medicine

## 2023-12-17 ENCOUNTER — Other Ambulatory Visit: Payer: Self-pay | Admitting: Family Medicine

## 2023-12-17 NOTE — Telephone Encounter (Signed)
 Patient called to let Dr Claudene know that she is still having a lot of pain in her back. She said at this point, she thinks it would be best to go ahead and have the injection in her back.   Please advise.  Patient would like a call back when this is ordered with more information.

## 2023-12-25 ENCOUNTER — Other Ambulatory Visit: Payer: Self-pay

## 2023-12-25 DIAGNOSIS — M5416 Radiculopathy, lumbar region: Secondary | ICD-10-CM

## 2023-12-27 NOTE — Telephone Encounter (Signed)
Patient called to schedule an appointment.  Scheduled for 2/4 and on cancellation list. She said that she would do whatever Dr Susan [name] thinks is best.

## 2024-01-02 ENCOUNTER — Other Ambulatory Visit: Payer: Medicare Other

## 2024-01-04 NOTE — Progress Notes (Signed)
 [name] [address] [place] [address] Phone: [phone] Subjective:   Susan [name], am serving as a scribe for Dr. Arthea Claudene.  I'm seeing this patient by the request  of:  Nche, Roselie Rockford, NP  CC: Bilateral knee pain  YEP:Dlagzrupcz  12/12/2023 Chronic problem with exacerbation.  Secondary to comorbidities and patient's age she is not a surgical candidate.  Does have fairly significant arthritic changes.  Increase activity slowly.  Continue to use the walker.  Follow-up with me again 3 months     Updated 01/08/2024 Susan [name] is a 89 y.o. female coming in with complaint of B knee pain, was seen previously within the last month and given steroid injections in the knees bilaterally.  Her knees are doing ok.   Patient was having worsening back pain.  We did discuss the possibility of an epidural.  Patient declined having the procedure.  We gave her the possibility of taking prednisone  which she also declined.  Patient states     Patient is taking 20 mg of Cymbalta  for pain.  Past Medical History:  Diagnosis Date   Actinic keratosis, hx of    chest wall (2012)   Allergic rhinitis    Anxiety    Arthritis    spine, hips (01/30/2017)   Bursitis    Cellulitis of right foot 01/30/2017   Colon polyps    Diverticulosis    Esophageal dysmotilities 10/16/2017   Fibromyalgia    qwhere (01/30/2017)   Gallstones    GERD (gastroesophageal reflux disease)    High cholesterol    History of kidney stones    History of stomach ulcers    Hx of squamous cell carcinoma excision    right mandible (2011), right arm (2014)   Hypertension    IBS (irritable bowel syndrome)    Lymphangitis 12/06/2018   Osteoarthritis    Pneumonia 2000s X 2   twice (01/30/2017)   Pyloric stenosis    Stroke (HCC) 10/2014   denies residual on 01/30/2017   Past Surgical History:  Procedure Laterality Date   APPENDECTOMY     CARDIOVASCULAR STRESS  TEST  04/2016   nuclear cardiolite stress test done in [place]  (no ischemia, no LVH, no LV systolic function)   CATARACT EXTRACTION W/ INTRAOCULAR LENS  IMPLANT, BILATERAL Bilateral    CHOLECYSTECTOMY OPEN     COLONOSCOPY  2009, 2014   DIAGNOSTIC MAMMOGRAM  2009, 2012   TONSILLECTOMY     Social History   Socioeconomic History   Marital status: Widowed    Spouse name: Not on file   Number of children: 4   Years of education: Not on file   Highest education level: Not on file  Occupational History   Occupation: retired  Tobacco Use   Smoking status: Never   Smokeless tobacco: Never  Vaping Use   Vaping status: Never Used  Substance and Sexual Activity   Alcohol use: Not Currently    Comment: glass of red wine maybe once week   Drug use: No   Sexual activity: Not on file  Other Topics Concern   Not on file  Social History Narrative   Not on file   Social Drivers of Health   Financial Resource Strain: Low Risk  (05/25/2023)   Overall Financial Resource Strain (CARDIA)    Difficulty of Paying Living Expenses: Not hard at all  Food Insecurity: No Food Insecurity (07/27/2023)   Hunger Vital Sign  Worried About Programme Researcher, Broadcasting/film/video in the Last Year: Never true    Ran Out of Food in the Last Year: Never true  Transportation Needs: No Transportation Needs (07/20/2023)   PRAPARE - Administrator, Civil Service (Medical): No    Lack of Transportation (Non-Medical): No  Physical Activity: Inactive (05/25/2023)   Exercise Vital Sign    Days of Exercise per Week: 0 days    Minutes of Exercise per Session: 0 min  Stress: No Stress Concern Present (05/25/2023)   [employer] of Occupational Health - Occupational Stress Questionnaire    Feeling of Stress : Only a little  Social Connections: Patient Declined (05/25/2023)   Social Connection and Isolation Panel [NHANES]    Frequency of Communication with Friends and Family: Patient declined    Frequency of Social  Gatherings with Friends and Family: Patient declined    Attends Religious Services: Patient declined    Database Administrator or Organizations: Patient declined    Attends Engineer, Structural: Patient declined    Marital Status: Patient declined   Allergies  Allergen Reactions   Butrans [Buprenorphine] Other (See Comments)    Severe chest pains   Codeine Other (See Comments)    Severe Chest pains  Other Reaction(s): severe chest pain   Morphine And Codeine Other (See Comments)    Severe chest pains   Morphine Sulfate     Other Reaction(s): severe chest pain    Family History  Problem Relation Age of Onset   Stroke Mother    Heart attack Mother    Stroke Father    Heart attack Father    Heart attack Maternal Grandmother    Stroke Maternal Grandmother    Stroke Paternal Grandmother    Heart attack Paternal Grandmother    Heart attack Maternal Grandfather    Stroke Maternal Grandfather    Stroke Paternal Grandfather    Heart attack Paternal Grandfather    Colon cancer Neg Hx    Pancreatic cancer Neg Hx    Stomach cancer Neg Hx    Esophageal cancer Neg Hx      Current Outpatient Medications (Cardiovascular):    cholestyramine  (QUESTRAN ) 4 g packet, DISSOLVE & TAKE 1 PACKET BY MOUTH TWICE DAILY   furosemide  (LASIX ) 20 MG tablet, Take 1 tablet by mouth once daily   losartan  (COZAAR ) 50 MG tablet, Take 1 tablet (50 mg total) by mouth daily.   metoprolol  succinate (TOPROL -XL) 25 MG 24 hr tablet, Take 1 tablet (25 mg total) by mouth daily.  Current Outpatient Medications (Respiratory):    fexofenadine (ALLEGRA) 180 MG tablet, Take 180 mg by mouth daily.   fluticasone  (FLONASE ) 50 MCG/ACT nasal spray, Use 1 spray(s) in each nostril twice daily  Current Outpatient Medications (Analgesics):    acetaminophen  (TYLENOL ) 650 MG CR tablet, Take 650 mg by mouth every 8 (eight) hours as needed for pain.   aspirin  EC 325 MG tablet, Take 325 mg by mouth daily.  Current  Outpatient Medications (Hematological):    IRON PO, Take 65 mg by mouth 3 (three) times a week.  Current Outpatient Medications (Other):    ALPRAZolam  (XANAX ) 0.5 MG tablet, TAKE 1 TABLET BY MOUTH ONCE DAILY AS NEEDED FOR ANXIETY   dicyclomine  (BENTYL ) 10 MG capsule, Take 1 capsule by mouth twice daily   diphenoxylate -atropine  (LOMOTIL ) 2.5-0.025 MG tablet, TAKE 1 TABLET BY MOUTH TWICE DAILY AS NEEDED FOR DIARRHEA OR LOOSE STOOLS   DULoxetine  (CYMBALTA ) 30 MG capsule,  Take 1 capsule (30 mg total) by mouth daily.   famotidine  (PEPCID ) 20 MG tablet, Take 1 tablet by mouth once daily   potassium chloride  SA (KLOR-CON  M) 20 MEQ tablet, Take 1 tablet (20 mEq total) by mouth daily.   Vitamin D , Ergocalciferol , (DRISDOL ) 1.25 MG (50000 UNIT) CAPS capsule, Take 1 capsule by mouth once a week   Reviewed prior external information including notes and imaging from  primary care provider As well as notes that were available from care everywhere and other healthcare systems.  Past medical history, social, surgical and family history all reviewed in electronic medical record.  No pertanent information unless stated regarding to the chief complaint.   Review of Systems:  No headache, visual changes, nausea, vomiting, diarrhea, constipation, dizziness,  skin rash, fevers, chills, night sweats, weight loss, swollen lymph nodes,  joint swelling, chest pain, shortness of breath, mood changes. POSITIVE muscle aches, body aches, abdominal pain  Objective  Blood pressure (!) 152/92, height 5' (1.524 m), weight 102 lb (46.3 kg).   General: No apparent distress alert and oriented x3 mood and affect normal, dressed appropriately.  HEENT: Pupils equal, extraocular movements intact  Respiratory: Patient's speak in full sentences and does not appear short of breath  Cardiovascular: No lower extremity edema, non tender, no erythema   Bilateral knees do have some crepitus noted.  Sitting relatively comfortably.   Patient does have diffuse back pain noted today.   Impression and Recommendations:     The above documentation has been reviewed and is accurate and complete Shaketa Serafin M Tomicka Lover, DO

## 2024-01-07 ENCOUNTER — Encounter: Payer: Self-pay | Admitting: Nurse Practitioner

## 2024-01-07 ENCOUNTER — Ambulatory Visit: Payer: Medicare Other | Admitting: Nurse Practitioner

## 2024-01-07 VITALS — BP 112/70 | HR 68 | Temp 98.1°F | Ht 60.0 in | Wt 103.8 lb

## 2024-01-07 DIAGNOSIS — G3184 Mild cognitive impairment, so stated: Secondary | ICD-10-CM

## 2024-01-07 DIAGNOSIS — I6522 Occlusion and stenosis of left carotid artery: Secondary | ICD-10-CM | POA: Insufficient documentation

## 2024-01-07 DIAGNOSIS — R739 Hyperglycemia, unspecified: Secondary | ICD-10-CM | POA: Diagnosis not present

## 2024-01-07 DIAGNOSIS — E782 Mixed hyperlipidemia: Secondary | ICD-10-CM | POA: Diagnosis not present

## 2024-01-07 DIAGNOSIS — D649 Anemia, unspecified: Secondary | ICD-10-CM

## 2024-01-07 DIAGNOSIS — F419 Anxiety disorder, unspecified: Secondary | ICD-10-CM

## 2024-01-07 DIAGNOSIS — I10 Essential (primary) hypertension: Secondary | ICD-10-CM

## 2024-01-07 LAB — LIPID PANEL
Cholesterol: 202 mg/dL — ABNORMAL HIGH (ref 0–200)
HDL: 72.4 mg/dL (ref >39.00)
LDL Cholesterol: 95 mg/dL (ref 0–99)
NonHDL: 129.97
Total CHOL/HDL Ratio: 3
Triglycerides: 176 mg/dL — ABNORMAL HIGH (ref 0.0–149.0)
VLDL: 35.2 mg/dL (ref 0.0–40.0)

## 2024-01-07 LAB — CBC WITH DIFFERENTIAL/PLATELET
Basophils Absolute: 0 10*3/uL (ref 0.0–0.1)
Basophils Relative: 0.7 % (ref 0.0–3.0)
Eosinophils Absolute: 0.1 10*3/uL (ref 0.0–0.7)
Eosinophils Relative: 1.7 % (ref 0.0–5.0)
HCT: 39.6 % (ref 36.0–46.0)
Hemoglobin: 12.9 g/dL (ref 12.0–15.0)
Lymphocytes Relative: 22 % (ref 12.0–46.0)
Lymphs Abs: 1.4 10*3/uL (ref 0.7–4.0)
MCHC: 32.7 g/dL (ref 30.0–36.0)
MCV: 94 fL (ref 78.0–100.0)
Monocytes Absolute: 0.5 10*3/uL (ref 0.1–1.0)
Monocytes Relative: 8.2 % (ref 3.0–12.0)
Neutro Abs: 4.4 10*3/uL (ref 1.4–7.7)
Neutrophils Relative %: 67.4 % (ref 43.0–77.0)
Platelets: 214 10*3/uL (ref 150.0–400.0)
RBC: 4.22 Mil/uL (ref 3.87–5.11)
RDW: 14.1 % (ref 11.5–15.5)
WBC: 6.6 10*3/uL (ref 4.0–10.5)

## 2024-01-07 LAB — COMPREHENSIVE METABOLIC PANEL
ALT: 13 U/L (ref 0–35)
AST: 21 U/L (ref 0–37)
Albumin: 3.8 g/dL (ref 3.5–5.2)
Alkaline Phosphatase: 61 U/L (ref 39–117)
BUN: 24 mg/dL — ABNORMAL HIGH (ref 6–23)
CO2: 29 meq/L (ref 19–32)
Calcium: 8.9 mg/dL (ref 8.4–10.5)
Chloride: 105 meq/L (ref 96–112)
Creatinine, Ser: 0.6 mg/dL (ref 0.40–1.20)
GFR: 75.39 mL/min (ref >60.00)
Glucose, Bld: 133 mg/dL — ABNORMAL HIGH (ref 70–99)
Potassium: 4.2 meq/L (ref 3.5–5.1)
Sodium: 141 meq/L (ref 135–145)
Total Bilirubin: 0.4 mg/dL (ref 0.2–1.2)
Total Protein: 6.6 g/dL (ref 6.0–8.3)

## 2024-01-07 LAB — HEMOGLOBIN A1C: Hgb A1c MFr Bld: 5.5 % (ref 4.6–6.5)

## 2024-01-07 MED ORDER — LOSARTAN POTASSIUM 50 MG PO TABS: 50.0000 mg | ORAL_TABLET | Freq: Every day | ORAL | 3 refills | Status: AC

## 2024-01-07 NOTE — Assessment & Plan Note (Addendum)
Waxing and waning mood. Chronic. Reports lack of motivation and feeling down.She struggles to participate in group activity at the nursing facility. She has support from adult children and grandchildren. She states they visit often and take her on outings Denies mood is any worse with Cymbalta 20mg . Pain is controlled with Cymbalta. She also take alprazolam 0.5mg  QDprn No SI/HI/hallucination  With difficulty hearing despite use of hearing aid, I encouraged to participate in small group activities. She denied referral to therapist

## 2024-01-07 NOTE — Assessment & Plan Note (Signed)
She agreed to repeat carotid duplex. maintain

## 2024-01-07 NOTE — Patient Instructions (Signed)
Decrease losartan dose to 50mg  daily Maintain other med doses Monitor BP every other day Send Bp readings via mychart in 2weeks Call office sooner if BP >140/80. Go to lab

## 2024-01-07 NOTE — Assessment & Plan Note (Signed)
BP at goal She wants want to decrease number of medications if appropriate. Current use of furosemide and potassium due to venous insufficiency, losartan 100mg  and metoprolol 25mg  XR. BP Readings from Last 3 Encounters:  01/07/24 112/70  12/12/23 118/82  10/19/23 132/72    She agreed to decreased losartan to 50mg  every day and maintain other med doses. She also agreed to have one of her daughters send BP readings via mychart in 2weeks. Repeat CMP

## 2024-01-07 NOTE — Progress Notes (Signed)
Established Patient Visit  Patient: Susan [name]   DOB: 1924-09-23   89 y.o. Female  MRN: [id number] Visit Date: 01/07/2024  Subjective:    Chief Complaint  Patient presents with   Hypertension    Follow up, concerns with memory   Essential hypertension BP at goal She wants want to decrease number of medications if appropriate. Current use of furosemide and potassium due to venous insufficiency, losartan 100mg  and metoprolol 25mg  XR. BP Readings from Last 3 Encounters:  01/07/24 112/70  12/12/23 118/82  10/19/23 132/72    She agreed to decreased losartan to 50mg  every day and maintain other med doses. She also agreed to have one of her daughters send BP readings via mychart in 2weeks. Repeat CMP   Anxiety Waxing and waning mood. Chronic. Reports lack of motivation and feeling down.She struggles to participate in group activity at the nursing facility. She has support from adult children and grandchildren. She states they visit often and take her on outings Denies mood is any worse with Cymbalta 20mg . Pain is controlled with Cymbalta. She also take alprazolam 0.5mg  QDprn No SI/HI/hallucination  With difficulty hearing despite use of hearing aid, I encouraged to participate in small group activities. She denied referral to therapist   Mild cognitive impairment Repeat MMSE today 29/30. She declined f/up appointment with neurology Advised to increase socialization with her peers at assisted living facility, completing word puzzles or reading or suduko.Marland Kitchen  Left carotid stenosis She agreed to repeat carotid duplex. maintain  Wt Readings from Last 3 Encounters:  01/07/24 103 lb 12.8 oz (47.1 kg)  10/19/23 104 lb 11.2 oz (47.5 kg)  08/15/23 104 lb 12.8 oz (47.5 kg)       01/08/2023    2:20 PM 05/16/2017   11:16 AM  MMSE - Mini Mental State Exam  Orientation to time 5 5  Orientation to Place 5 5  Registration 3 3  Attention/ Calculation 5 5  Recall  1 2  Language- name 2 objects 2 2  Language- repeat 1 1  Language- follow 3 step command 3 3  Language- read & follow direction 1 1  Write a sentence 1 1  Copy design 1 1  Total score 28 29       01/07/2024   11:07 AM 01/07/2024   11:06 AM 08/15/2023    1:48 PM  Depression screen PHQ 2/9  Decreased Interest 3 3 2   Down, Depressed, Hopeless 1 1 0  PHQ - 2 Score 4 4 2   Altered sleeping 0  0  Tired, decreased energy 1  3  Change in appetite 1  2  Feeling bad or failure about yourself  0  1  Trouble concentrating 0  0  Moving slowly or fidgety/restless 0  0  Suicidal thoughts 0  0  PHQ-9 Score 6  8  Difficult doing work/chores Not difficult at all  Somewhat difficult       08/15/2023    1:49 PM 01/08/2023    1:35 PM 12/28/2021    2:47 PM 06/27/2021    2:11 PM  GAD 7 : Generalized Anxiety Score  Nervous, Anxious, on Edge 1 1 1 1   Control/stop worrying 0 0 0 1  Worry too much - different things 1 1 1 1   Trouble relaxing 0 0 0 0  Restless 0 0 0 0  Easily annoyed or irritable 0 0 0 0  Afraid - awful might happen 0 0 0 0  Total GAD 7 Score 2 2 2 3   Anxiety Difficulty Somewhat difficult Somewhat difficult Not difficult at all Somewhat difficult   Reviewed medical, surgical, and social history today  Medications: Outpatient Medications Prior to Visit  Medication Sig   acetaminophen (TYLENOL) 650 MG CR tablet Take 650 mg by mouth every 8 (eight) hours as needed for pain.   ALPRAZolam (XANAX) 0.5 MG tablet TAKE 1 TABLET BY MOUTH ONCE DAILY AS NEEDED FOR ANXIETY   aspirin EC 325 MG tablet Take 325 mg by mouth daily.   cholestyramine (QUESTRAN) 4 g packet DISSOLVE & TAKE 1 PACKET BY MOUTH TWICE DAILY   dicyclomine (BENTYL) 10 MG capsule Take 1 capsule by mouth twice daily   diphenoxylate-atropine (LOMOTIL) 2.5-0.025 MG tablet TAKE 1 TABLET BY MOUTH TWICE DAILY AS NEEDED FOR DIARRHEA OR LOOSE STOOLS   DULoxetine (CYMBALTA) 20 MG capsule Take 1 capsule by mouth once daily   famotidine  (PEPCID) 20 MG tablet Take 1 tablet by mouth once daily   fexofenadine (ALLEGRA) 180 MG tablet Take 180 mg by mouth daily.   fluticasone (FLONASE) 50 MCG/ACT nasal spray Use 1 spray(s) in each nostril twice daily   furosemide (LASIX) 20 MG tablet Take 1 tablet by mouth once daily   IRON PO Take 65 mg by mouth 3 (three) times a week.   metoprolol succinate (TOPROL-XL) 25 MG 24 hr tablet Take 1 tablet (25 mg total) by mouth daily.   potassium chloride SA (KLOR-CON M) 20 MEQ tablet Take 1 tablet (20 mEq total) by mouth daily.   Vitamin D, Ergocalciferol, (DRISDOL) 1.25 MG (50000 UNIT) CAPS capsule Take 1 capsule by mouth once a week   [DISCONTINUED] losartan (COZAAR) 50 MG tablet Take 2 tablets (100 mg total) by mouth daily.   [DISCONTINUED] mupirocin ointment (BACTROBAN) 2 % Apply 1 Application topically 2 (two) times daily. Apply with dressing change   [DISCONTINUED] sodium chloride (OCEAN) 0.65 % SOLN nasal spray Place 1 spray into both nostrils as needed for congestion.   No facility-administered medications prior to visit.   Reviewed past medical and social history.   ROS per HPI above      Objective:  BP 112/70 (BP Location: Left Arm, Patient Position: Sitting, Cuff Size: Small)   Pulse 68   Temp 98.1 F (36.7 C)   Ht 5' (1.524 m)   Wt 103 lb 12.8 oz (47.1 kg)   SpO2 95%   BMI 20.27 kg/m      Physical Exam Vitals and nursing note reviewed.  Cardiovascular:     Rate and Rhythm: Normal rate and regular rhythm.     Pulses: Normal pulses.     Heart sounds: Murmur heard.  Pulmonary:     Effort: Pulmonary effort is normal.     Breath sounds: Normal breath sounds.  Musculoskeletal:     Right lower leg: No edema.     Left lower leg: No edema.  Skin:    Findings: No erythema.  Neurological:     Mental Status: She is alert and oriented to person, place, and time.  Psychiatric:        Attention and Perception: Attention normal.        Mood and Affect: Mood is depressed.         Speech: Speech normal.        Behavior: Behavior is cooperative.        Thought Content: Thought content normal.  Cognition and Memory: Cognition and memory normal.        Judgment: Judgment normal.     No results found for any visits on 01/07/24.    Assessment & Plan:    Problem List Items Addressed This Visit     Anxiety   Waxing and waning mood. Chronic. Reports lack of motivation and feeling down.She struggles to participate in group activity at the nursing facility. She has support from adult children and grandchildren. She states they visit often and take her on outings Denies mood is any worse with Cymbalta 20mg . Pain is controlled with Cymbalta. She also take alprazolam 0.5mg  QDprn No SI/HI/hallucination  With difficulty hearing despite use of hearing aid, I encouraged to participate in small group activities. She denied referral to therapist       Essential hypertension   BP at goal She wants want to decrease number of medications if appropriate. Current use of furosemide and potassium due to venous insufficiency, losartan 100mg  and metoprolol 25mg  XR. BP Readings from Last 3 Encounters:  01/07/24 112/70  12/12/23 118/82  10/19/23 132/72    She agreed to decreased losartan to 50mg  every day and maintain other med doses. She also agreed to have one of her daughters send BP readings via mychart in 2weeks. Repeat CMP       Relevant Medications   losartan (COZAAR) 50 MG tablet   Other Relevant Orders   Comprehensive metabolic panel   Hyperglycemia   Relevant Orders   Hemoglobin A1c   Comprehensive metabolic panel   Hyperlipidemia   Relevant Medications   losartan (COZAAR) 50 MG tablet   Other Relevant Orders   Comprehensive metabolic panel   Lipid panel   Left carotid stenosis   She agreed to repeat carotid duplex. maintain      Relevant Medications   losartan (COZAAR) 50 MG tablet   Other Relevant Orders   VAS US CAROTID   Lipid panel    Mild cognitive impairment - Primary   Repeat MMSE today 29/30. She declined f/up appointment with neurology Advised to increase socialization with her peers at assisted living facility, completing word puzzles or reading or suduko..      Relevant Orders   Comprehensive metabolic panel   Other Visit Diagnoses       Anemia, unspecified type       Relevant Orders   CBC with Differential/Platelet      Return in about 3 months (around 04/05/2024) for HTN.     Alysia Penna, NP

## 2024-01-07 NOTE — Assessment & Plan Note (Signed)
Repeat MMSE today 29/30. She declined f/up appointment with neurology Advised to increase socialization with her peers at assisted living facility, completing word puzzles or reading or suduko.Marland Kitchen

## 2024-01-08 ENCOUNTER — Encounter: Payer: Self-pay | Admitting: Family Medicine

## 2024-01-08 ENCOUNTER — Encounter: Payer: Self-pay | Admitting: Nurse Practitioner

## 2024-01-08 ENCOUNTER — Ambulatory Visit (INDEPENDENT_AMBULATORY_CARE_PROVIDER_SITE_OTHER): Payer: Medicare Other | Admitting: Family Medicine

## 2024-01-08 VITALS — BP 152/92 | Ht 60.0 in | Wt 102.0 lb

## 2024-01-08 DIAGNOSIS — M797 Fibromyalgia: Secondary | ICD-10-CM

## 2024-01-08 DIAGNOSIS — G9332 Myalgic encephalomyelitis/chronic fatigue syndrome: Secondary | ICD-10-CM

## 2024-01-08 MED ORDER — DULOXETINE HCL 30 MG PO CPEP: 30.0000 mg | ORAL_CAPSULE | Freq: Every day | ORAL | 0 refills | Status: DC

## 2024-01-08 NOTE — Patient Instructions (Addendum)
Cymbalta 30mg   Send message in 2 weeks if doing well continue cymbalta 30mg  otherwise do prednisone 20mg  for 7 days See me again in 2 months

## 2024-01-08 NOTE — Assessment & Plan Note (Signed)
 Discussed HEP  Discussed different treatment options with patient as well as patient's daughter.  We discussed different things such as oral medications, injections, further imaging.  After a long discussion discussed potentially increasing follow-up again 6 to 8 weeks.  We did decide to increase Cymbalta  to 30 mg.  Warned of potential side effects.  Hopeful that patient will tolerated well.  If does not make significant improvement would like to repeat an epidural in the back.  Patient is in agreement with the plan.

## 2024-01-17 ENCOUNTER — Ambulatory Visit (HOSPITAL_COMMUNITY)
Admission: RE | Admit: 2024-01-17 | Discharge: 2024-01-17 | Disposition: A | Discharge status: Home / Self Care | Payer: Medicare Other | Source: Ambulatory Visit | Attending: Nurse Practitioner | Admitting: Nurse Practitioner

## 2024-01-17 DIAGNOSIS — I6522 Occlusion and stenosis of left carotid artery: Secondary | ICD-10-CM | POA: Diagnosis present

## 2024-01-18 ENCOUNTER — Encounter: Payer: Self-pay | Admitting: Nurse Practitioner

## 2024-01-19 ENCOUNTER — Other Ambulatory Visit: Payer: Self-pay | Admitting: Internal Medicine

## 2024-01-19 ENCOUNTER — Other Ambulatory Visit: Payer: Self-pay | Admitting: Nurse Practitioner

## 2024-01-19 DIAGNOSIS — K2100 Gastro-esophageal reflux disease with esophagitis, without bleeding: Secondary | ICD-10-CM

## 2024-01-21 ENCOUNTER — Telehealth: Payer: Self-pay

## 2024-01-21 NOTE — Telephone Encounter (Signed)
I called patient to check on her blood pressure readings per message from [place]. Patient said that her BP was 193/ but could not remember bottom number last night. This morning it was 135/76 at 9am before taking medication.

## 2024-01-21 NOTE — Telephone Encounter (Signed)
Requesting: Famotidine 20 MG Oral Tablet  Last Visit: 01/07/2024 Next Visit: 5/2/2/025 Last Refill: 10/12/2023  Please Advise

## 2024-01-21 NOTE — Telephone Encounter (Signed)
Last office visit in 2022 - ok to refill?

## 2024-01-23 ENCOUNTER — Ambulatory Visit (INDEPENDENT_AMBULATORY_CARE_PROVIDER_SITE_OTHER): Payer: Medicare Other | Admitting: Nurse Practitioner

## 2024-01-23 ENCOUNTER — Encounter: Payer: Self-pay | Admitting: Nurse Practitioner

## 2024-01-23 ENCOUNTER — Ambulatory Visit: Payer: Self-pay | Admitting: Nurse Practitioner

## 2024-01-23 ENCOUNTER — Emergency Department (HOSPITAL_BASED_OUTPATIENT_CLINIC_OR_DEPARTMENT_OTHER): Payer: Medicare Other

## 2024-01-23 ENCOUNTER — Other Ambulatory Visit: Payer: Self-pay

## 2024-01-23 ENCOUNTER — Emergency Department (HOSPITAL_BASED_OUTPATIENT_CLINIC_OR_DEPARTMENT_OTHER)
Admission: EM | Admit: 2024-01-23 | Discharge: 2024-01-23 | Disposition: A | Discharge status: Home / Self Care | Payer: Medicare Other | Attending: Emergency Medicine | Admitting: Emergency Medicine

## 2024-01-23 ENCOUNTER — Encounter (HOSPITAL_BASED_OUTPATIENT_CLINIC_OR_DEPARTMENT_OTHER): Payer: Self-pay

## 2024-01-23 VITALS — BP 203/122 | HR 71 | Ht 60.0 in | Wt 103.0 lb

## 2024-01-23 DIAGNOSIS — R0602 Shortness of breath: Secondary | ICD-10-CM

## 2024-01-23 DIAGNOSIS — F0390 Unspecified dementia without behavioral disturbance: Secondary | ICD-10-CM | POA: Insufficient documentation

## 2024-01-23 DIAGNOSIS — J984 Other disorders of lung: Secondary | ICD-10-CM | POA: Diagnosis not present

## 2024-01-23 DIAGNOSIS — I447 Left bundle-branch block, unspecified: Secondary | ICD-10-CM | POA: Diagnosis not present

## 2024-01-23 DIAGNOSIS — I872 Venous insufficiency (chronic) (peripheral): Secondary | ICD-10-CM

## 2024-01-23 DIAGNOSIS — Z79899 Other long term (current) drug therapy: Secondary | ICD-10-CM | POA: Diagnosis not present

## 2024-01-23 DIAGNOSIS — I2489 Other forms of acute ischemic heart disease: Secondary | ICD-10-CM

## 2024-01-23 DIAGNOSIS — K219 Gastro-esophageal reflux disease without esophagitis: Secondary | ICD-10-CM | POA: Insufficient documentation

## 2024-01-23 DIAGNOSIS — Z7982 Long term (current) use of aspirin: Secondary | ICD-10-CM | POA: Insufficient documentation

## 2024-01-23 DIAGNOSIS — R0789 Other chest pain: Secondary | ICD-10-CM | POA: Insufficient documentation

## 2024-01-23 DIAGNOSIS — I160 Hypertensive urgency: Secondary | ICD-10-CM

## 2024-01-23 DIAGNOSIS — R059 Cough, unspecified: Secondary | ICD-10-CM | POA: Diagnosis not present

## 2024-01-23 DIAGNOSIS — R072 Precordial pain: Secondary | ICD-10-CM

## 2024-01-23 DIAGNOSIS — R918 Other nonspecific abnormal finding of lung field: Secondary | ICD-10-CM | POA: Diagnosis not present

## 2024-01-23 DIAGNOSIS — R7989 Other specified abnormal findings of blood chemistry: Secondary | ICD-10-CM | POA: Diagnosis present

## 2024-01-23 DIAGNOSIS — I10 Essential (primary) hypertension: Secondary | ICD-10-CM | POA: Diagnosis not present

## 2024-01-23 DIAGNOSIS — R079 Chest pain, unspecified: Secondary | ICD-10-CM | POA: Diagnosis not present

## 2024-01-23 HISTORY — DX: Hypertensive urgency: I16.0

## 2024-01-23 HISTORY — DX: Other specified abnormal findings of blood chemistry: R79.89

## 2024-01-23 HISTORY — DX: Shortness of breath: R06.02

## 2024-01-23 LAB — CBC
HCT: 40.1 % (ref 36.0–46.0)
Hemoglobin: 13 g/dL (ref 12.0–15.0)
MCH: 30.2 pg (ref 26.0–34.0)
MCHC: 32.4 g/dL (ref 30.0–36.0)
MCV: 93 fL (ref 80.0–100.0)
Platelets: 256 10*3/uL (ref 150–400)
RBC: 4.31 MIL/uL (ref 3.87–5.11)
RDW: 13.7 % (ref 11.5–15.5)
WBC: 6.6 10*3/uL (ref 4.0–10.5)
nRBC: 0 % (ref 0.0–0.2)

## 2024-01-23 LAB — BASIC METABOLIC PANEL
Anion gap: 10 (ref 5–15)
BUN: 19 mg/dL (ref 8–23)
CO2: 26 mmol/L (ref 22–32)
Calcium: 9 mg/dL (ref 8.9–10.3)
Chloride: 104 mmol/L (ref 98–111)
Creatinine, Ser: 0.55 mg/dL (ref 0.44–1.00)
GFR, Estimated: >60 mL/min (ref >60)
Glucose, Bld: 102 mg/dL — ABNORMAL HIGH (ref 70–99)
Potassium: 3.5 mmol/L (ref 3.5–5.1)
Sodium: 140 mmol/L (ref 135–145)

## 2024-01-23 LAB — BRAIN NATRIURETIC PEPTIDE: B Natriuretic Peptide: 607.1 pg/mL — ABNORMAL HIGH (ref 0.0–100.0)

## 2024-01-23 LAB — HEPATIC FUNCTION PANEL
ALT: 20 U/L (ref 0–44)
AST: 26 U/L (ref 15–41)
Albumin: 3.7 g/dL (ref 3.5–5.0)
Alkaline Phosphatase: 57 U/L (ref 38–126)
Bilirubin, Direct: 0.1 mg/dL (ref 0.0–0.2)
Indirect Bilirubin: 0.3 mg/dL (ref 0.3–0.9)
Total Bilirubin: 0.4 mg/dL (ref 0.0–1.2)
Total Protein: 6.9 g/dL (ref 6.5–8.1)

## 2024-01-23 LAB — TROPONIN I (HIGH SENSITIVITY)
Troponin I (High Sensitivity): 25 ng/L — ABNORMAL HIGH (ref <18)
Troponin I (High Sensitivity): 31 ng/L — ABNORMAL HIGH (ref <18)

## 2024-01-23 MED ORDER — FUROSEMIDE 10 MG/ML IJ SOLN
20.0000 mg | Freq: Once | INTRAMUSCULAR | Status: AC
Start: 2024-01-23 — End: 2024-01-23
  Administered 2024-01-23: 20 mg via INTRAVENOUS
  Filled 2024-01-23: qty 2

## 2024-01-23 MED ORDER — POTASSIUM CHLORIDE CRYS ER 20 MEQ PO TBCR: 20.0000 meq | EXTENDED_RELEASE_TABLET | Freq: Every day | ORAL | 1 refills | Status: AC

## 2024-01-23 MED ORDER — HYDROCHLOROTHIAZIDE 25 MG PO TABS: 25.0000 mg | ORAL_TABLET | Freq: Every day | ORAL | 0 refills | Status: DC

## 2024-01-23 MED ORDER — FUROSEMIDE 20 MG PO TABS: 20.0000 mg | ORAL_TABLET | Freq: Every day | ORAL | 0 refills | Status: DC

## 2024-01-23 NOTE — ED Notes (Signed)
2nd troponin taken to lab at [facility]

## 2024-01-23 NOTE — ED Provider Notes (Signed)
[facility] Provider Note   CSN: [id number] Arrival date & time: 01/23/24  1615     History  Chief Complaint  Patient presents with   Hypertension    Susan [name] is a 89 y.o. female.   Hypertension Associated symptoms include shortness of breath.  Patient is a 89 year old female presents the ED today complaining of 2-week history of hypertensive urgency, and a 1 day history of shortness of breath on exertion, chest tightness.  History of GERD, hypertension, dementia.  States that she has had increased cough.  Recently saw her internal medicine doctor who was concerned for possible pneumonia versus fluid overload for symptoms.  Denies fever, abdominal pain, nausea, vomiting, dysuria, lower extremity swelling.      Home Medications Prior to Admission medications   Medication Sig Start Date End Date Taking? Authorizing Provider  hydrochlorothiazide (HYDRODIURIL) 25 MG tablet Take 1 tablet (25 mg total) by mouth daily. 01/23/24  Yes Lunette Stands, PA-C  acetaminophen (TYLENOL) 650 MG CR tablet Take 650 mg by mouth every 8 (eight) hours as needed for pain.    [provider]  ALPRAZolam Prudy Feeler) 0.5 MG tablet TAKE 1 TABLET BY MOUTH ONCE DAILY AS NEEDED FOR ANXIETY 10/12/23   Nche, Bonna Gains, NP  aspirin EC 325 MG tablet Take 325 mg by mouth daily.    [provider]  cholestyramine (QUESTRAN) 4 g packet DISSOLVE & TAKE 1 PACKET BY MOUTH TWICE DAILY 06/12/23   Hilarie Fredrickson, MD  dicyclomine (BENTYL) 10 MG capsule Take 1 capsule by mouth twice daily 10/03/23   Nche, Bonna Gains, NP  diphenoxylate-atropine (LOMOTIL) 2.5-0.025 MG tablet TAKE 1 TABLET BY MOUTH TWICE DAILY AS NEEDED FOR DIARRHEA OR LOOSE STOOLS 01/23/24   Hilarie Fredrickson, MD  DULoxetine (CYMBALTA) 20 MG capsule Take 1 capsule (20 mg total) by mouth daily. 01/23/24   Nche, Bonna Gains, NP  famotidine (PEPCID) 20 MG tablet Take 1 tablet by mouth once  daily 01/21/24   Nche, Bonna Gains, NP  fexofenadine (ALLEGRA) 180 MG tablet Take 180 mg by mouth daily.    [provider]  fluticasone (FLONASE) 50 MCG/ACT nasal spray Use 1 spray(s) in each nostril twice daily 01/02/23   Nche, Bonna Gains, NP  furosemide (LASIX) 20 MG tablet Take 1 tablet (20 mg total) by mouth daily. 01/23/24   Lunette Stands, PA-C  IRON PO Take 65 mg by mouth 3 (three) times a week.    [provider]  losartan (COZAAR) 50 MG tablet Take 1 tablet (50 mg total) by mouth daily. 01/07/24   Nche, Bonna Gains, NP  metoprolol succinate (TOPROL-XL) 25 MG 24 hr tablet Take 1 tablet (25 mg total) by mouth daily. 07/09/23   Nche, Bonna Gains, NP  potassium chloride SA (KLOR-CON M) 20 MEQ tablet Take 1 tablet (20 mEq total) by mouth daily. 01/23/24   Lunette Stands, PA-C  Vitamin D, Ergocalciferol, (DRISDOL) 1.25 MG (50000 UNIT) CAPS capsule Take 1 capsule by mouth once a week 12/17/23   Judi Saa, DO      Allergies    Butrans [buprenorphine], Codeine, Morphine and codeine, and Morphine sulfate    Review of Systems   Review of Systems  Respiratory:  Positive for shortness of breath.   All other systems reviewed and are negative.   Physical Exam Updated Vital Signs BP (!) 178/76   Pulse (!) 59   Temp 97.9 F (36.6 C) (  Oral)   Resp 15   Ht 5' (1.524 m)   Wt 46.3 kg   SpO2 95%   BMI 19.92 kg/m  Physical Exam Vitals and nursing note reviewed.  Constitutional:      General: She is not in acute distress.    Appearance: Normal appearance. She is not ill-appearing.  HENT:     Head: Normocephalic and atraumatic.  Eyes:     General: No scleral icterus.       Right eye: No discharge.        Left eye: No discharge.     Extraocular Movements: Extraocular movements intact.     Conjunctiva/sclera: Conjunctivae normal.  Cardiovascular:     Rate and Rhythm: Normal rate and regular rhythm.     Pulses: Normal pulses.     Heart sounds: Murmur heard.   Pulmonary:     Effort: Pulmonary effort is normal. No respiratory distress.     Breath sounds: Normal breath sounds. No stridor. No wheezing, rhonchi or rales.  Abdominal:     General: Abdomen is flat.     Palpations: Abdomen is soft.     Tenderness: There is no abdominal tenderness.  Musculoskeletal:        General: No swelling or tenderness.     Cervical back: Normal range of motion and neck supple. No rigidity or tenderness.     Right lower leg: No edema.     Left lower leg: No edema.  Skin:    General: Skin is warm and dry.     Coloration: Skin is not jaundiced or pale.     Findings: No erythema.  Neurological:     General: No focal deficit present.     Mental Status: She is alert and oriented to person, place, and time. Mental status is at baseline.     Cranial Nerves: No cranial nerve deficit.     Sensory: No sensory deficit.     Motor: No weakness.     Coordination: Coordination normal.  Psychiatric:        Mood and Affect: Mood normal.        Thought Content: Thought content normal.     ED Results / Procedures / Treatments   Labs (all labs ordered are listed, but only abnormal results are displayed) Labs Reviewed  BASIC METABOLIC PANEL - Abnormal; Notable for the following components:      Result Value   Glucose, Bld 102 (*)    All other components within normal limits  BRAIN NATRIURETIC PEPTIDE - Abnormal; Notable for the following components:   B Natriuretic Peptide 607.1 (*)    All other components within normal limits  TROPONIN I (HIGH SENSITIVITY) - Abnormal; Notable for the following components:   Troponin I (High Sensitivity) 25 (*)    All other components within normal limits  TROPONIN I (HIGH SENSITIVITY) - Abnormal; Notable for the following components:   Troponin I (High Sensitivity) 31 (*)    All other components within normal limits  CBC  HEPATIC FUNCTION PANEL  TROPONIN I (HIGH SENSITIVITY)    EKG EKG Interpretation Date/Time:  Wednesday  January 23 2024 16:27:58 EST Ventricular Rate:  69 PR Interval:  154 QRS Duration:  120 QT Interval:  434 QTC Calculation: 465 R Axis:   -63  Text Interpretation: Sinus rhythm Incomplete left bundle branch block LVH with secondary repolarization abnormality Confirmed by Fulton Reek [id number]) on 01/23/2024 4:32:09 PM  Radiology DG Chest 2 View Result Date: 01/23/2024 CLINICAL DATA:  Chest pain. EXAM: CHEST - 2 VIEW COMPARISON:  Chest radiograph dated 06/30/2021. FINDINGS: Rounded density at the right lung base appears to have been present on the CT of 06/30/2021 and may be related to lobulated contour of the dome of the liver. A pulmonary nodule is less likely. CT may provide better evaluation. No new consolidation. There is no pleural effusion pneumothorax. The cardiac silhouette is within normal limits. No acute osseous pathology. IMPRESSION: 1. No acute cardiopulmonary process. 2. Rounded density at the right lung base appears to have been present on the CT of 06/30/2021 and may be related to lobulated contour of the dome of the liver. A pulmonary nodule is less likely. CT may provide better evaluation. Electronically Signed   By: Elgie Collard M.D.   On: 01/23/2024 17:30    Procedures Procedures    Medications Ordered in ED Medications  furosemide (LASIX) injection 20 mg (20 mg Intravenous Given 01/23/24 2020)    ED Course/ Medical Decision Making/ A&P Clinical Course as of 01/23/24 2049  Wed Jan 23, 2024  1743 Troponin I (High Sensitivity)(!): 25 [CB]    Clinical Course User Index [CB] Lunette Stands, PA-C             HEART Score: 5                    Medical Decision Making Amount and/or Complexity of Data Reviewed Labs: ordered. Radiology: ordered.   This patient is a 89 year old female who presents to the ED for concern of hypertensive urgency x 2 weeks and shortness of breath starting yesterday.  Differential diagnoses prior to evaluation: The emergent  differential diagnosis includes, but is not limited to, PE, ACS, pneumonia, dissection, fluid overload, heart failure. This is not an exhaustive differential.   Past Medical History / Co-morbidities / Social History: HTN, IBS, CVA, Sica syndrome, pyloric stenosis, has a legal guardian, DNR, murmur, atypical chest pain, venous insufficiencies  Additional history: Chart reviewed. Pertinent results include:   08/21/2023, echo was done which showed EF 55 to 60%, moderate mitral valve regurgitation, moderate mitral annular calcification, moderate tricuspid valve regurgitation.  Saw internal medicine today for hypertensive urgency.  Noted to live at independent living facility 11.  Reporting feeling shaky, dizziness, short of breath on exertion.  Had taken her losartan and metoprolol this morning.  Does not take furosemide.  Was concerned about fluid overload, pneumonia.  Previously seen by Dr. Cristal Deer and cardiology on 10/19/2023.  Noted to not have any symptoms of volume overload, would only repeat echo if symptoms change.  Lab Tests/Imaging studies: I personally interpreted labs/imaging and the pertinent results include:   CBC unremarkable Hepatic function unremarkable BMP unremarkable BNP was elevated at 607.1 Troponin was 25 with a delta of 31  Chest x-ray showed no acute findings I agree with the radiologist interpretation.  Cardiac monitoring: EKG obtained and interpreted by myself and attending physician which shows: Sinus rhythm  EKG Interpretation Date/Time:  Wednesday January 23 2024 16:27:58 EST Ventricular Rate:  69 PR Interval:  154 QRS Duration:  120 QT Interval:  434 QTC Calculation: 465 R Axis:   -63  Text Interpretation: Sinus rhythm Incomplete left bundle branch block LVH with secondary repolarization abnormality Confirmed by Fulton Reek [phone]) on 01/23/2024 4:32:09 PM          Medications: I ordered medication including Lasix.  I have reviewed the  patients home medicines and have made adjustments as needed.  Critical Interventions:  ED  Course:   Patient is a 89 year old female presents ED today with her legal guardian, her daughter, complaining of a 1 day history of chest pain and shortness of breath on exertion in the presence of a 2-week history of an elevated blood pressure.  Previous medical history of hypertension, GERD, dementia.  Seen by telemedicine today.  Sent in due to concerns for fluid overload, pneumonia for the shortness of breath.  Patient is currently taking metoprolol and losartan for BP control, has not known the last time she has taken her Lasix.  Living at an assisted living alone.  Providing home medications.  Denies orthopnea, leg swelling.  Physical exam is remarkable for a previously noticed heart murmur but is otherwise unremarkable.  Patient was ambulated with pulse ox monitoring and noted to be 94% on room air upon ambulation.  Patient is noted to be high functioning with reasonable responses to questions.   Troponin was initially 25 without significant change. BNP was also noted to be 607.  On reevaluation, patient is asymptomatic and wishing to go home.  Spoke to Dr. Odis Hollingshead. He recommended Hydrochlorothiazide 25 mg in the morning + lasix in the morning + losartan at night + 20 meq potassium every day. Saying she will need repeat blood work including a bnp + mg in 1 week can be done by family practice. If patient is requesting admission, we can admit for obs, but is not necessarily required at this time due to patient being asymptomatic and wishing to go home.   Shared decision-making was done with legal guardian and patient who wished for the patient not to be admitted for observation and to go home with close follow-up.  Expressed plan given by cardiology and patient and legal guardian both expressed agreement and understanding of plan.  Patient's blood pressure had come down to 178/76 and with her being  asymptomatic this time, I believe this patient is safe to be discharged at this time and followed up outpatient.  Provided strict return to ED precautions.    Disposition: After consideration of the diagnostic results and the patients response to treatment, I feel that the patient benefit from discharge and treatment as above.   emergency department workup does not suggest an emergent condition requiring admission or immediate intervention beyond what has been performed at this time. The plan is: Start HCTZ, begin furosemide, continue take potassium chloride 20 mill equivalents a day, follow-up for blood draw within 1 week for repeat labs, return for any new or worsening symptoms. The patient is safe for discharge and has been instructed to return immediately for worsening symptoms, change in symptoms or any other concerns.  Final Clinical Impression(s) / ED Diagnoses Final diagnoses:  Hypertension, unspecified type  Atypical chest pain    Rx / DC Orders ED Discharge Orders          Ordered    hydrochlorothiazide (HYDRODIURIL) 25 MG tablet  Daily        01/23/24 2044    furosemide (LASIX) 20 MG tablet  Daily        01/23/24 2044    potassium chloride SA (KLOR-CON M) 20 MEQ tablet  Daily        01/23/24 2044              Lavonia Drafts 01/23/24 2051    Laurence Spates, MD 01/23/24 2113

## 2024-01-23 NOTE — Progress Notes (Signed)
ON-CALL CARDIOLOGY 01/23/24  Patient's name: Susan [name].   MRN: [id number].    DOB: 18-Oct-1924 Primary care provider: Anne Ng, NP. Primary cardiologist: Jodelle Red, MD  Patient location: [facility]. Provider location: [place].  Referring provider: Baldo Ash, PA  Interaction regarding this patient's care today: Patient presented to the hospital with chief complaint of shortness of breath and chest pain.  She has noted to have elevated blood pressures for approximately 2 weeks.  On arrival systolic blood pressures were 347 mmHg according to my discussion with ED physician extender Mr.Bauer.   I was told that patient lives in an assisted living facility given her underlying dementia and medication compliance may be the reason why her blood pressures are not well-controlled.  She currently takes losartan and metoprolol.  She chooses not to be on Lasix for reasons unknown.  Her precordial chest pain is not consistent with cardiac discomfort.  In the ER blood pressure medications were administered shortness of breath has improved and chest pain has resolved.  Cardiology called to review her case since she presented with chest pain, shortness of breath, and has elevated troponins.  ED provider informs me that they would like to go home today if possible with close follow-up.  Temp:  [97.9 F (36.6 C)] 97.9 F (36.6 C) (02/19 1620) Pulse Rate:  [59-78] 76 (02/19 2045) Cardiac Rhythm: Normal sinus rhythm (02/19 1645) Resp:  [15-25] 25 (02/19 2045) BP: (127-218)/(76-122) 127/112 (02/19 2045) SpO2:  [94 %-98 %] 96 % (02/19 2045) Weight:  [46.3 kg-46.7 kg] 46.3 kg (02/19 1620)  Today's Vitals   01/23/24 2021 01/23/24 2045 01/23/24 2100 01/23/24 2110  BP:  (!) 127/112    Pulse:  76    Resp:  (!) 25    Temp:      TempSrc:      SpO2:  96%    Weight:      Height:      PainSc: 0-No pain  0-No pain 0-No pain   Body mass index is 19.92  kg/m.  LABORATORY DATA:    Latest Ref Rng & Units 01/23/2024    4:23 PM 01/07/2024   12:08 PM 07/27/2023   11:16 AM  CBC  WBC 4.0 - 10.5 K/uL 6.6  6.6  6.3   Hemoglobin 12.0 - 15.0 g/dL 42.5  95.6  38.7   Hematocrit 36.0 - 46.0 % 40.1  39.6  38.3   Platelets 150 - 400 K/uL 256  214.0  308.0        Latest Ref Rng & Units 01/23/2024    4:23 PM 01/07/2024   12:08 PM 07/27/2023   11:16 AM  CMP  Glucose 70 - 99 mg/dL 564  332  951   BUN 8 - 23 mg/dL 19  24  20    Creatinine 0.44 - 1.00 mg/dL 8.84  1.66  0.63   Sodium 135 - 145 mmol/L 140  141  142   Potassium 3.5 - 5.1 mmol/L 3.5  4.2  4.7   Chloride 98 - 111 mmol/L 104  105  104   CO2 22 - 32 mmol/L 26  29  29    Calcium 8.9 - 10.3 mg/dL 9.0  8.9  8.7   Total Protein 6.5 - 8.1 g/dL 6.9  6.6    Total Bilirubin 0.0 - 1.2 mg/dL 0.4  0.4    Alkaline Phos 38 - 126 U/L 57  61    AST 15 - 41 U/L 26  21    ALT 0 - 44 U/L 20  13     BNP    Component Value Date/Time   BNP 607.1 (H) 01/23/2024 1623   BNP 42.5 02/15/2016 0000   HEMOGLOBIN A1C Lab Results  Component Value Date   HGBA1C 5.5 01/07/2024   MPG 114 01/31/2017    Cardiac Panel (last 3 results) Recent Labs    01/23/24 1623 01/23/24 1743  TROPONINIHS 25* 31*    CHOLESTEROL Lab Results  Component Value Date   CHOL 202 (H) 01/07/2024   HDL 72.40 01/07/2024   LDLCALC 95 01/07/2024   LDLDIRECT 111.0 01/08/2023   TRIG 176.0 (H) 01/07/2024   CHOLHDL 3 01/07/2024    Impression: Hypertensive urgency. Shortness of breath. Precordial pain. Elevated troponins likely demand ischemia Elevated BNP likely secondary to hypertensive urgency & underlying valvular heart disease  Recommendations: Currently chest pain-free.  Shortness of breath improving.  Blood pressure is also improving with SBP around 150 mmHg.  Lab work notes elevated troponins but essentially flat -not suggestive of ACS. Highly suggestive of demand ischemia in the setting of hypertensive urgency  EKG  sinus rhythm without underlying injury pattern.  As per patient and her daughter's request I believe it is very reasonable for the patient to be discharged home under her daughter's care as per their wishes given her advanced age and underlying dementia with close follow-up and addressing her blood pressure medications.  Start hydrochlorothiazide 25 mg p.o. every morning. Restart prior dose of Lasix 20 mg p.o. every morning. Prescribed K-Dur 20 milliequivalents p.o. daily for 7 days. Continue losartan 50 mg p.o. every afternoon. Follow-up with PCP within 1 week to reevaluate blood pressures and to check BMP, BNP, and magnesium levels.  Arranged outpatient follow-up with Dr. Cristal Deer to reevaluate blood pressure management as well as chest pain/shortness of breath.  If symptoms resurface patient is advised to come back to the hospital for more expedited evaluation.   Telephone encounter total time: 15 minute   Tessa Lerner, DO, [facility]  [address] [place], [place] [address] 01/23/2024 10:48 PM

## 2024-01-23 NOTE — Discharge Instructions (Addendum)
You are seen today for high blood pressure and chest pain.  Consulted with cardiology who wish for you to follow-up in 1 week for a blood redraw which would include another BNP as well as a magnesium.  This may done through primary care.  Also recommend that you follow-up with Dr. Cristal Deer your cardiologist that you previously saw in November 2024.  Cardiology recommended that you begin taking 25 mg of hydrochlorothiazide every morning alongside 20 mg of Lasix with your metoprolol that you are already taking.  Be sure to also start taking a potassium supplement as these 2 diuretics remove potassium from your system.  Cardiology also recommended that you take your losartan at night.  If you begin to have any new or worsening symptoms including worsening shortness of breath, worsening chest pain, confusion, headache, fever, vision changes, unilateral weakness return to the ED for immediate evaluation.

## 2024-01-23 NOTE — Telephone Encounter (Signed)
Lomotil refilled °

## 2024-01-23 NOTE — Progress Notes (Signed)
Virtual Visit via Telephone Note  I connected with Susan [name] on 01/23/24 at  2:00 PM EST by a telephone enabled telemedicine application and verified that I am speaking with the correct person using two identifiers.  Location: Patient:Home Provider: Office Participants: patient, daughter-Susan [name] and provider Time: 02:10pm to 02:30pm and 03:00pm to 03:16pm  I discussed the limitations of evaluation and management by telemedicine and the availability of in person appointments. She does not have access to electronic device with video access. I also discussed with the patient that there may be a patient responsible charge related to this service. The patient expressed understanding and agreed to proceed.  ZO:[id number] BP, SOB  History of Present Illness:  Susan [name] lives in an independent living facility alone. Today she complains of feeling shaky, dizziness, and SOB with exertion. This is associated with elevated BP. Onset of symptoms last night. She took 100mg  of losartan and 25mg  of metoprolol this AM. She does not take furosemide and does not remember how long she has been off medication. She denies any LE edema, no CP, no focal weakness, no numbness or tingling. I contacted Susan [name] via telephone. Susan [name] arrived at the apartment at 3pm. She reports Susan [name] does not observe any visible respiratory distress at rest, any asymmetry and she is able to follow simple commands.  BP Readings from Last 3 Encounters:  01/23/24 (!) 203/122  01/08/24 (!) 152/92  01/07/24 112/70    Observations/Objective: Physical Exam Vitals reviewed.  Neurological:     Mental Status: She is alert and oriented to person, place, and time.     Comments: Speech is clear and coherent     Assessment and Plan: Susan [name] was seen today for hypertension.  Diagnoses and all orders for this visit:  Hypertensive urgency  Shortness of breath   Follow Up Instructions: Susan [name] to take her mother-Susan [name]  to the ED for additional evaluation. My physical evaluation is limited via telephone and I am concerned about possible fluid overload vs pneumonia. With very elevated BP, she is at risk for an acute CVA or MI. Susan [name] and Susan [name] verbalized understanding and agreed to go to [facility] ED on [address].   I discussed the assessment and treatment plan with the patient. The patient was provided an opportunity to ask questions and all were answered. The patient agreed with the plan and demonstrated an understanding of the instructions.   The patient was advised to call back or seek an in-person evaluation if the symptoms worsen or if the condition fails to improve as anticipated.  Alysia Penna, NP

## 2024-01-23 NOTE — ED Provider Notes (Signed)
89 year old female history of dementia, GERD, hypertension presenting for hypertension and chest tightness.  Patient has had elevated blood pressure for couple weeks.  She takes metoprolol and losartan which she took today.  No recent dose changes.  She has had some intermittent chest tightness and shortness of breath since yesterday.  Some exertional shortness of breath but no exertional chest pain.  No orthopnea or lower extremity edema.  Currently she is asymptomatic.  She is hypertensive.  EKG unchanged from prior.  Troponin mildly elevated 25 and 31 are relatively flat.  Blood pressure significantly improved without intervention.  BNP is mildly elevated although she does not appear volume overload has no pulmonary edema on her chest x-ray.  Chest x-ray shows no acute findings but does show chronic abnormalities to be pulmonary nodule versus abnormality related to the dome of the diaphragm, recommended close follow-up with PCP.  We spoke with cardiology.  Overall we suspect her mild elevated troponin and chest pain are likely due to her hypertension.  I do not see signs of hypertensive emergency at this time.  I spoke with cardiology who recommended close cardiology follow-up, starting hydrochlorothiazide, Lasix, potassium and continuing her other medications.  We had shared decision-making conversation with the patient and her daughter who is her guardian at bedside.  They would like to avoid admission which I think is reasonable given she is asymptomatic.  Would give her a dose of Lasix here and blood pressure remained improved.  Strict return precautions were given.   Laurence Spates, MD 01/23/24 2113

## 2024-01-23 NOTE — Telephone Encounter (Signed)
  Chief Complaint: 153/84 elevated bp Symptoms: woozy, head swimming Frequency: constant Pertinent Negatives: Patient denies fever, URI, pain, sob, cp Disposition: [] ED /[] Urgent Care (no appt availability in office) / [x] Appointment(In office/virtual)/ []  [facility]/ [] Home Care/ [] Refused Recommended Disposition /[] [facility]/ []  Follow-up with PCP Additional Notes: apt made for tomorrow per protocol.  Care advice given, denies questions and instructed to go to er if becomes worse.   Reason for Disposition  Systolic BP  >= 180 OR Diastolic >= 110  Answer Assessment - Initial Assessment Questions 1. BLOOD PRESSURE: "What is the blood pressure?" "Did you take at least two measurements 5 minutes apart?"     153/84 2. ONSET: "When did you take your blood pressure?"     This am 3. HOW: "How did you take your blood pressure?" (e.g., automatic home BP monitor, visiting nurse)     automatic 4. HISTORY: "Do you have a history of high blood pressure?"     yes 5. MEDICINES: "Are you taking any medicines for blood pressure?" "Have you missed any doses recently?"     Took all medications this am 6. OTHER SYMPTOMS: "Do you have any symptoms?" (e.g., blurred vision, chest pain, difficulty breathing, headache, weakness)     Woozy at times, head spinning 7. PREGNANCY: "Is there any chance you are pregnant?" "When was your last menstrual period?"     na  Protocols used: Blood Pressure - High-A-AH

## 2024-01-23 NOTE — ED Triage Notes (Signed)
Pt c/o elevated BP for about 2 weeks and shortness of breath since yesterday.

## 2024-01-24 ENCOUNTER — Ambulatory Visit: Payer: Medicare Other | Admitting: Family Medicine

## 2024-01-24 ENCOUNTER — Encounter: Payer: Self-pay | Admitting: Nurse Practitioner

## 2024-01-24 ENCOUNTER — Encounter (HOSPITAL_BASED_OUTPATIENT_CLINIC_OR_DEPARTMENT_OTHER): Payer: Self-pay

## 2024-01-24 DIAGNOSIS — K529 Noninfective gastroenteritis and colitis, unspecified: Secondary | ICD-10-CM

## 2024-01-25 ENCOUNTER — Other Ambulatory Visit: Payer: Self-pay | Admitting: Nurse Practitioner

## 2024-01-25 DIAGNOSIS — K529 Noninfective gastroenteritis and colitis, unspecified: Secondary | ICD-10-CM

## 2024-01-27 ENCOUNTER — Encounter: Payer: Self-pay | Admitting: Family Medicine

## 2024-01-28 ENCOUNTER — Telehealth: Payer: Self-pay

## 2024-01-28 ENCOUNTER — Other Ambulatory Visit (HOSPITAL_COMMUNITY): Payer: Self-pay

## 2024-01-28 NOTE — Telephone Encounter (Unsigned)
 Copied from CRM [id number]. Topic: Clinical - Medication Question >> Jan 25, 2024  5:28 PM Sonny Dandy B wrote: Reason for CRM: pt's called regarding dicyclomine (BENTYL) 10 MG capsule states her pharmacy refuses to pay for the medication. State the medication should not be taken with diphenoxylate-atropine (LOMOTIL) 2.5-0.025 MG tablet. Pt is requesting a call back, [phone]

## 2024-01-28 NOTE — Assessment & Plan Note (Addendum)
 Ms. Montenegro reports chronic intermittent ABD pain and diarrhea, intermittent fecal incontinence. Unable to tolerate Questran- bloating Symptoms are controlled with use of bentyl-dicyclomine BID and lomotil prn for several year Last eval by GI-Dr. Marina Goodell 2022.

## 2024-01-28 NOTE — Telephone Encounter (Signed)
 Pharmacy Patient Advocate Encounter   Received notification from Patient Advice Request messages that prior authorization for Dicyclomine 10mg  caps is required/requested.   Insurance verification completed.   The patient is insured through [employer] .   Per test claim: PA required; PA submitted to above mentioned insurance via CoverMyMeds Key/confirmation #/EOC [id number] Status is pending

## 2024-01-29 ENCOUNTER — Encounter: Payer: Self-pay | Admitting: Nurse Practitioner

## 2024-01-29 ENCOUNTER — Ambulatory Visit (INDEPENDENT_AMBULATORY_CARE_PROVIDER_SITE_OTHER): Payer: Medicare Other | Admitting: Nurse Practitioner

## 2024-01-29 VITALS — BP 114/62 | HR 64 | Temp 97.0°F | Ht 60.0 in | Wt 102.0 lb

## 2024-01-29 DIAGNOSIS — I10 Essential (primary) hypertension: Secondary | ICD-10-CM | POA: Diagnosis not present

## 2024-01-29 DIAGNOSIS — F419 Anxiety disorder, unspecified: Secondary | ICD-10-CM | POA: Diagnosis not present

## 2024-01-29 DIAGNOSIS — I872 Venous insufficiency (chronic) (peripheral): Secondary | ICD-10-CM | POA: Diagnosis not present

## 2024-01-29 LAB — RENAL FUNCTION PANEL
Albumin: 3.9 g/dL (ref 3.5–5.2)
BUN: 33 mg/dL — ABNORMAL HIGH (ref 6–23)
CO2: 35 meq/L — ABNORMAL HIGH (ref 19–32)
Calcium: 9.5 mg/dL (ref 8.4–10.5)
Chloride: 97 meq/L (ref 96–112)
Creatinine, Ser: 0.68 mg/dL (ref 0.40–1.20)
GFR: 73.12 mL/min (ref >60.00)
Glucose, Bld: 108 mg/dL — ABNORMAL HIGH (ref 70–99)
Phosphorus: 3.9 mg/dL (ref 2.3–4.6)
Potassium: 3.6 meq/L (ref 3.5–5.1)
Sodium: 141 meq/L (ref 135–145)

## 2024-01-29 MED ORDER — FUROSEMIDE 20 MG PO TABS: 20.0000 mg | ORAL_TABLET | Freq: Every day | ORAL | 0 refills | Status: DC

## 2024-01-29 NOTE — Telephone Encounter (Signed)
 Patient is scheduled to see Alysia Penna, NP today and I will pass this along.

## 2024-01-29 NOTE — Progress Notes (Signed)
 Established Patient Visit  Patient: Susan [name]   DOB: October 21, 1924   89 y.o. Female  MRN: [id number] Visit Date: 01/29/2024  Subjective:    Chief Complaint  Patient presents with   Follow-up    Blood pressure issues, recent ER visit for increased BP    HPI Accompanied by daughter-Linda.  Essential hypertension Home BP reading since ED visit: 110s-80s/70s-60s Denies any dizziness with low BP Reports persistent fatigue and SOB with exertion since ED visit. Labs completed by ED provider 01/23/24: normal BMP, hepatic panel, CBC, troponin x 2, BNP. No acute finding on CXR. BP Readings from Last 3 Encounters:  01/29/24 114/62  01/23/24 (!) 127/112  01/23/24 (!) 203/122    Wt Readings from Last 3 Encounters:  01/29/24 102 lb (46.3 kg)  01/23/24 102 lb (46.3 kg)  01/23/24 103 lb (46.7 kg)    Stop hydrochlorothiazide Maintain furosemide, potassium, losartan, and metoprolol dose Provided BP perimeters: Hold all BP meds if BP<90/70, and Hold furosemide and potassium if BP<120/80. Repeat BMP today F/up in 3months  Anxiety She agreed to psychology referral  Reviewed medical, surgical, and social history today  Medications: Outpatient Medications Prior to Visit  Medication Sig   acetaminophen (TYLENOL) 650 MG CR tablet Take 650 mg by mouth every 8 (eight) hours as needed for pain.   ALPRAZolam (XANAX) 0.5 MG tablet TAKE 1 TABLET BY MOUTH ONCE DAILY AS NEEDED FOR ANXIETY   aspirin EC 325 MG tablet Take 325 mg by mouth daily.   cholestyramine (QUESTRAN) 4 g packet DISSOLVE & TAKE 1 PACKET BY MOUTH TWICE DAILY   diphenoxylate-atropine (LOMOTIL) 2.5-0.025 MG tablet TAKE 1 TABLET BY MOUTH TWICE DAILY AS NEEDED FOR DIARRHEA OR LOOSE STOOLS   DULoxetine (CYMBALTA) 20 MG capsule Take 1 capsule (20 mg total) by mouth daily.   famotidine (PEPCID) 20 MG tablet Take 1 tablet by mouth once daily   fexofenadine (ALLEGRA) 180 MG tablet Take 180 mg by mouth daily.    fluticasone (FLONASE) 50 MCG/ACT nasal spray Use 1 spray(s) in each nostril twice daily   IRON PO Take 65 mg by mouth 3 (three) times a week.   losartan (COZAAR) 50 MG tablet Take 1 tablet (50 mg total) by mouth daily.   metoprolol succinate (TOPROL-XL) 25 MG 24 hr tablet Take 1 tablet (25 mg total) by mouth daily.   potassium chloride SA (KLOR-CON M) 20 MEQ tablet Take 1 tablet (20 mEq total) by mouth daily.   Vitamin D, Ergocalciferol, (DRISDOL) 1.25 MG (50000 UNIT) CAPS capsule Take 1 capsule by mouth once a week   [DISCONTINUED] furosemide (LASIX) 20 MG tablet Take 1 tablet (20 mg total) by mouth daily.   [DISCONTINUED] hydrochlorothiazide (HYDRODIURIL) 25 MG tablet Take 1 tablet (25 mg total) by mouth daily.   dicyclomine (BENTYL) 10 MG capsule Take 1 capsule by mouth twice daily (Patient not taking: Reported on 01/29/2024)   No facility-administered medications prior to visit.   Reviewed past medical and social history.   ROS per HPI above  Last CBC Lab Results  Component Value Date   WBC 6.6 01/23/2024   HGB 13.0 01/23/2024   HCT 40.1 01/23/2024   MCV 93.0 01/23/2024   MCH 30.2 01/23/2024   RDW 13.7 01/23/2024   PLT 256 01/23/2024   Last metabolic panel Lab Results  Component Value Date   GLUCOSE 102 (H) 01/23/2024   NA 140 01/23/2024  K 3.5 01/23/2024   CL 104 01/23/2024   CO2 26 01/23/2024   BUN 19 01/23/2024   CREATININE 0.55 01/23/2024   GFRNONAA >60 01/23/2024   CALCIUM 9.0 01/23/2024   PROT 6.9 01/23/2024   ALBUMIN 3.7 01/23/2024   BILITOT 0.4 01/23/2024   ALKPHOS 57 01/23/2024   AST 26 01/23/2024   ALT 20 01/23/2024   ANIONGAP 10 01/23/2024        Objective:  BP 114/62 (BP Location: Right Arm, Patient Position: Sitting, Cuff Size: Normal)   Pulse 64   Temp (!) 97 F (36.1 C) (Temporal)   Ht 5' (1.524 m)   Wt 102 lb (46.3 kg)   SpO2 94%   BMI 19.92 kg/m      Physical Exam Vitals and nursing note reviewed.  Cardiovascular:     Rate and  Rhythm: Normal rate.     Pulses: Normal pulses.  Pulmonary:     Effort: Pulmonary effort is normal.  Musculoskeletal:     Right lower leg: No edema.     Left lower leg: No edema.  Neurological:     Mental Status: She is alert and oriented to person, place, and time.     No results found for any visits on 01/29/24.    Assessment & Plan:    Problem List Items Addressed This Visit     Anxiety   She agreed to psychology referral      Relevant Orders   Ambulatory referral to Psychology   Essential hypertension - Primary   Home BP reading since ED visit: 110s-80s/70s-60s Denies any dizziness with low BP Reports persistent fatigue and SOB with exertion since ED visit. Labs completed by ED provider 01/23/24: normal BMP, hepatic panel, CBC, troponin x 2, BNP. No acute finding on CXR. BP Readings from Last 3 Encounters:  01/29/24 114/62  01/23/24 (!) 127/112  01/23/24 (!) 203/122    Wt Readings from Last 3 Encounters:  01/29/24 102 lb (46.3 kg)  01/23/24 102 lb (46.3 kg)  01/23/24 103 lb (46.7 kg)    Stop hydrochlorothiazide Maintain furosemide, potassium, losartan, and metoprolol dose Provided BP perimeters: Hold all BP meds if BP<90/70, and Hold furosemide and potassium if BP<120/80. Repeat BMP today F/up in 3months      Relevant Medications   furosemide (LASIX) 20 MG tablet (Start on 02/20/2024)   Other Relevant Orders   Renal Function Panel   Venous insufficiency of both lower extremities   Relevant Medications   furosemide (LASIX) 20 MG tablet (Start on 02/20/2024)   Return in about 3 months (around 04/27/2024) for HTN.     Alysia Penna, NP

## 2024-01-29 NOTE — Assessment & Plan Note (Addendum)
 Home BP reading since ED visit: 110s-80s/70s-60s Denies any dizziness with low BP Reports persistent fatigue and SOB with exertion since ED visit. Labs completed by ED provider 01/23/24: normal BMP, hepatic panel, CBC, troponin x 2, BNP. No acute finding on CXR. BP Readings from Last 3 Encounters:  01/29/24 114/62  01/23/24 (!) 127/112  01/23/24 (!) 203/122    Wt Readings from Last 3 Encounters:  01/29/24 102 lb (46.3 kg)  01/23/24 102 lb (46.3 kg)  01/23/24 103 lb (46.7 kg)    Stop hydrochlorothiazide Maintain furosemide, potassium, losartan, and metoprolol dose Provided BP perimeters: Hold all BP meds if BP<90/70, and Hold furosemide and potassium if BP<120/80. Repeat BMP today F/up in 3months

## 2024-01-29 NOTE — Telephone Encounter (Signed)
 Patient made aware at todays appointment.

## 2024-01-29 NOTE — Assessment & Plan Note (Signed)
 She agreed to psychology referral

## 2024-01-29 NOTE — Patient Instructions (Signed)
 Go to lab. Stop hydrochlorothiazide Continue other med doses Hold all BP meds if BP<90/70 Hold furosemide and potassium if BP<120/80.

## 2024-01-29 NOTE — Telephone Encounter (Signed)
 Pharmacy Patient Advocate Encounter  Received notification from [facility] that Prior Authorization for Dicyclomine 10mg  caps has been APPROVED from 12/05/23 to 12/03/24   PA #/Case ID/Reference #:  [id number]

## 2024-01-30 ENCOUNTER — Ambulatory Visit (HOSPITAL_BASED_OUTPATIENT_CLINIC_OR_DEPARTMENT_OTHER): Payer: Medicare Other | Admitting: Cardiology

## 2024-01-30 ENCOUNTER — Encounter: Payer: Self-pay | Admitting: Nurse Practitioner

## 2024-01-30 ENCOUNTER — Encounter (HOSPITAL_BASED_OUTPATIENT_CLINIC_OR_DEPARTMENT_OTHER): Payer: Self-pay | Admitting: Cardiology

## 2024-01-30 ENCOUNTER — Telehealth: Payer: Self-pay

## 2024-01-30 VITALS — BP 130/90 | HR 60 | Ht 60.0 in | Wt 104.4 lb

## 2024-01-30 DIAGNOSIS — R011 Cardiac murmur, unspecified: Secondary | ICD-10-CM | POA: Diagnosis not present

## 2024-01-30 DIAGNOSIS — R7989 Other specified abnormal findings of blood chemistry: Secondary | ICD-10-CM

## 2024-01-30 DIAGNOSIS — I160 Hypertensive urgency: Secondary | ICD-10-CM | POA: Diagnosis not present

## 2024-01-30 DIAGNOSIS — R0989 Other specified symptoms and signs involving the circulatory and respiratory systems: Secondary | ICD-10-CM | POA: Diagnosis not present

## 2024-01-30 NOTE — Progress Notes (Signed)
 Cardiology Office Note:  .   Date:  01/30/2024  ID:  Susan [name], DOB 13-Apr-1924, MRN [id number] PCP: Anne Ng, NP  [facility] Providers Cardiologist:  Jodelle Red, MD {  History of Present Illness: .   Susan [name] is a 89 y.o. female with PMH hypertension, chronic venous insufficiency referred to cardiology for evaluation of murmur and chest pain.  Pertinent CV history: Echo 08/21/23 shows EF 55-60%, Moderate MR, moderate TR, no AS. Stress test in 2017 unremarkable.  Today: Reviewed recent ER visit from Susan [name]. Reviewed by Dr. Odis Hollingshead. Reviewed her test results, including troponins, at length.  Patient and her daughter note that her BP was as high as 250/110. No clear triggers for this. BP improved to 170 systolic without any intervention.   Saw Charlotte Nche 01/07/24, BP 112/70, decreased losartan from 100 mg to 50 mg daily. Saw Dr. Katrinka Blazing on 2/4, BP 152/92. Had virtual visit on 2/19, BP 203/122, sent to ER.  Dr. Odis Hollingshead recommended starting hydrochlorothiazide 25 mg daily (was given 7 days of this), restarting lasix 20 mg daily, continue losartan 50 mg daily. She denies any swelling at the time.   Reviewed BP log from home. The day after the ER visit, BP was 107/72 on only her prior losartan and metoprolol. Had not started hydrochlorothiazide. Blood pressure since then has been as low as 87/66.  On her med list, lasix had been stopped, but per the daughter it looks like she had actually been continued on this.  She was not having any pain during the event, but felt off in general, decreased sense of what was going on. She knew things weren't right.  ROS: Denies chest pain, shortness of breath at rest or with normal exertion. No PND, orthopnea, LE edema or unexpected weight gain. No syncope or palpitations. ROS otherwise negative except as noted.   Studies Reviewed: Marland Kitchen    EKG:       Physical Exam:   VS:  BP (!) 130/90   Pulse 60   Ht 5'  (1.524 m)   Wt 104 lb 6.4 oz (47.4 kg)   SpO2 95%   BMI 20.39 kg/m    Wt Readings from Last 3 Encounters:  01/30/24 104 lb 6.4 oz (47.4 kg)  01/29/24 102 lb (46.3 kg)  01/23/24 102 lb (46.3 kg)    GEN: Well nourished, well developed in no acute distress HEENT: Normal, moist mucous membranes NECK: No JVD CARDIAC: regular rhythm, normal S1 and S2, no rubs or gallops. 2-3/6 murmur LLSB VASCULAR: Radial and DP pulses 2+ bilaterally. No carotid bruits RESPIRATORY:  Clear to auscultation without rales, wheezing or rhonchi  ABDOMEN: Soft, non-tender, non-distended MUSCULOSKELETAL:  Ambulates independently SKIN: Warm and dry, no edema NEUROLOGIC:  Alert and oriented x 3. No focal neuro deficits noted. PSYCHIATRIC:  Normal affect    ASSESSMENT AND PLAN: .    Labile blood pressure Recent hypertensive urgency vs. Emergency -she is euvolemic today, denies any swelling at her ER visit, BNP elevated but CXR unremarkable -reviewed her BP pattern extensively. Unclear what the trigger was, and her BP dropped by >100 points the following day without change in her regimen (without starting the hydrochlorothiazide).  -my concern is to not overtreat, as she has had BP at low as 87/66 -would continue losartan 50 mg daily, metoprolol succinate 25 mg daily. Ok to do lasix 20 mg daily as long as her BP is stable.  -she will check home BP, let me  know if she is having high/low numbers. If she has rare periodic highs, would consider PRN hydralazine rather than intensifying her daily regimen. -we discussed that her mildly elevated hsTn is most consistent with her hypertensive urgency vs. Emergency and not ACS  Murmur -moderate TR, moderate MR on echo. Reviewed test results today. -no symptoms of volume overload -would repeat echo only for change in symptoms  Dispo: 6-8 weeks with Eligha Bridegroom or Gillian Shields  Total time of encounter: I spent 46 minutes dedicated to the care of this patient on the  date of this encounter to include pre-visit review of records, face-to-face time with the patient discussing conditions above, and clinical documentation with the electronic health record. We specifically spent time today discussing her pattern of elevated blood pressure, recent ER visit, potential triggers, signs/symptoms to watch for, and management options.   Signed, Jodelle Red, MD   Jodelle Red, MD, PhD, [facility]  [facility]  [place]  [facility] at [facility] at [facility] [address], [address] [place], [place] [address] [phone]

## 2024-01-30 NOTE — Telephone Encounter (Addendum)
 E2C2 and/or triage nurse can give message--------  ------ Left a voice message asking to give me a call back at the office. I will try calling again -------  ----- Message from Alysia Penna sent at 01/30/2024  2:36 PM EST ----- Stable renal function

## 2024-01-30 NOTE — Patient Instructions (Signed)
 Medication Instructions:  No changes *If you need a refill on your cardiac medications before your next appointment, please call your pharmacy*  Lab Work: No labs If you have labs (blood work) drawn today and your tests are completely normal, you will receive your results only by: MyChart Message (if you have MyChart) OR A paper copy in the mail If you have any lab test that is abnormal or we need to change your treatment, we will call you to review the results.  Testing/Procedures: No testing  Follow-Up: At [facility], you and your health needs are our priority.  As part of our continuing mission to provide you with exceptional heart care, we have created designated Provider Care Teams.  These Care Teams include your primary Cardiologist (physician) and Advanced Practice Providers (APPs -  Physician Assistants and Nurse Practitioners) who all work together to provide you with the care you need, when you need it.  We recommend signing up for the patient portal called "MyChart".  Sign up information is provided on this After Visit Summary.  MyChart is used to connect with patients for Virtual Visits (Telemedicine).  Patients are able to view lab/test results, encounter notes, upcoming appointments, etc.  Non-urgent messages can be sent to your provider as well.   To learn more about what you can do with MyChart, go to [email or web address].    Your next appointment:   6-8 week(s)  Provider:   Eligha Bridegroom, NP or Gillian Shields, NP

## 2024-01-31 ENCOUNTER — Telehealth: Payer: Self-pay

## 2024-01-31 NOTE — Telephone Encounter (Signed)
 Copied from CRM [id number]. Topic: Clinical - Medication Question >> Jan 25, 2024  5:28 PM Sonny Dandy B wrote: Reason for CRM: pt's called regarding dicyclomine (BENTYL) 10 MG capsule states her pharmacy refuses to pay for the medication. State the medication should not be taken with diphenoxylate-atropine (LOMOTIL) 2.5-0.025 MG tablet. Pt is requesting a call back, [phone]

## 2024-01-31 NOTE — Telephone Encounter (Signed)
 I did see that this was addressed at 01/29/24 office visit. I called patient's daughter [name] and LVM to return call.

## 2024-02-04 ENCOUNTER — Other Ambulatory Visit: Payer: Self-pay | Admitting: Family Medicine

## 2024-02-06 ENCOUNTER — Encounter (HOSPITAL_BASED_OUTPATIENT_CLINIC_OR_DEPARTMENT_OTHER): Payer: Self-pay

## 2024-02-07 ENCOUNTER — Other Ambulatory Visit: Payer: Self-pay | Admitting: Family Medicine

## 2024-02-08 ENCOUNTER — Other Ambulatory Visit: Payer: Self-pay

## 2024-02-08 MED ORDER — DULOXETINE HCL 20 MG PO CPEP: 20.0000 mg | ORAL_CAPSULE | Freq: Every day | ORAL | 3 refills | Status: DC

## 2024-02-11 ENCOUNTER — Ambulatory Visit: Payer: Medicare Other | Admitting: Nurse Practitioner

## 2024-02-20 NOTE — Telephone Encounter (Signed)
 Called and spoke to patient. DOB verified.  Patient's BP log: PER PT, ALL BLOOD PRESSURES ARE AFTER MEDICATIONS  3/7: 133/71 - 55 3/8: 106/60 - 57 3/9: 129/63 - 60 3/10: 137/72 - 63 3/11: 142/75 - 59 3/12: 127/68 - 58 3/13: 118/65 - 59 3/14: 126/69 - 59 3/15: 117/65 - 54 3/16: 124/66 - 58 3/17: 130/72 - 56 3/18: 118 / 67 - 57 3/19: 113/64 - 58   - Patient states she is feeling stronger and does not feeling pain. Denies headache or any other sx.  - Patient ensured that she takes her blood pressure after her medications and rests before hand.  Nurse's Recommendations:  - Congratulated patient on blood pressures readings and informed that she is taking her blood pressure correctly.  - Will route BP readings to APP for review.  - Advised patient to call office if she has any other concerns. Will call patient if APP has any other recommendations regarding blood pressure.  Patient verbalized understanding and was thankful for call.

## 2024-02-25 NOTE — Progress Notes (Unsigned)
 [name] [address] [place] [address] Phone: [phone] Subjective:   Susan [name], am serving as a scribe for Dr. Antoine Primas.  I'm seeing this patient by the request  of:  Nche, Bonna Gains, NP  CC: Body aches and pain follow-up  [id number]  01/08/2024 Discussed HEP  Discussed different treatment options with patient as well as patient's daughter.  We discussed different things such as oral medications, injections, further imaging.  After a long discussion discussed potentially increasing follow-up again 6 to 8 weeks.   We did decide to increase Cymbalta to 30 mg.  Warned of potential side effects.  Hopeful that patient will tolerated well.  If does not make significant improvement would like to repeat an epidural in the back.  Patient is in agreement with the plan.     Updated 02/27/2024 Susan [name] is a 89 y.o. female coming in with complaint of fibromyalgia. Patient said that pain in knees and back is still there but not as bad. Patient has not been getting out as much.   Patient was unable to tolerate the 30 mg of the doxycycline so went back to the 20 mg. Patient did send information to her primary care of the following blood pressure from March 7 through the 19th showing significant better control.    Past Medical History:  Diagnosis Date   Actinic keratosis, hx of    chest wall (2012)   Allergic rhinitis    Anxiety    Arthritis    "spine, hips" (01/30/2017)   Bursitis    Cellulitis of right foot 01/30/2017   Colon polyps    Diverticulosis    Elevated brain natriuretic peptide (BNP) level 01/23/2024   Elevated troponin 01/30/2017   Esophageal dysmotilities 10/16/2017   Fibromyalgia    "qwhere" (01/30/2017)   Gallstones    GERD (gastroesophageal reflux disease)    High cholesterol    History of kidney stones    History of stomach ulcers    Hx of squamous cell carcinoma excision    right mandible  (2011), right arm (2014)   Hypertension    Hypertensive urgency 01/23/2024   Hyponatremia 07/20/2023   IBS (irritable bowel syndrome)    Lymphangitis 12/06/2018   Osteoarthritis    Pneumonia 2000s X 2   "twice" (01/30/2017)   Pyloric stenosis    Stroke (HCC) 10/2014   denies residual on 01/30/2017   Past Surgical History:  Procedure Laterality Date   APPENDECTOMY     CARDIOVASCULAR STRESS TEST  04/2016   nuclear cardiolite stress test done in [place] (no ischemia, no LVH, no LV systolic function)   CATARACT EXTRACTION W/ INTRAOCULAR LENS  IMPLANT, BILATERAL Bilateral    CHOLECYSTECTOMY OPEN     COLONOSCOPY  2009, 2014   DIAGNOSTIC MAMMOGRAM  2009, 2012   TONSILLECTOMY     Social History   Socioeconomic History   Marital status: Widowed    Spouse name: Not on file   Number of children: 4   Years of education: Not on file   Highest education level: Not on file  Occupational History   Occupation: retired  Tobacco Use   Smoking status: Never   Smokeless tobacco: Never  Vaping Use   Vaping status: Never Used  Substance and Sexual Activity   Alcohol use: Not Currently    Comment: glass of red wine maybe once week   Drug use: No   Sexual activity: Not on file  Other  Topics Concern   Not on file  Social History Narrative   Not on file   Social Drivers of Health   Financial Resource Strain: Low Risk  (05/25/2023)   Overall Financial Resource Strain (CARDIA)    Difficulty of Paying Living Expenses: Not hard at all  Food Insecurity: No Food Insecurity (07/27/2023)   Hunger Vital Sign    Worried About Running Out of Food in the Last Year: Never true    Ran Out of Food in the Last Year: Never true  Transportation Needs: No Transportation Needs (07/20/2023)   PRAPARE - Administrator, Civil Service (Medical): No    Lack of Transportation (Non-Medical): No  Physical Activity: Inactive (05/25/2023)   Exercise Vital Sign    Days of Exercise per Week: 0 days     Minutes of Exercise per Session: 0 min  Stress: No Stress Concern Present (05/25/2023)   [employer] of Occupational Health - Occupational Stress Questionnaire    Feeling of Stress : Only a little  Social Connections: Patient Declined (05/25/2023)   Social Connection and Isolation Panel [NHANES]    Frequency of Communication with Friends and Family: Patient declined    Frequency of Social Gatherings with Friends and Family: Patient declined    Attends Religious Services: Patient declined    Database administrator or Organizations: Patient declined    Attends Engineer, structural: Patient declined    Marital Status: Patient declined   Allergies  Allergen Reactions   Butrans [Buprenorphine] Other (See Comments)    Severe chest pains   Codeine Other (See Comments)    Severe Chest pains  Other Reaction(s): severe chest pain   Morphine And Codeine Other (See Comments)    Severe chest pains   Morphine Sulfate     Other Reaction(s): severe chest pain    Family History  Problem Relation Age of Onset   Stroke Mother    Heart attack Mother    Stroke Father    Heart attack Father    Heart attack Maternal Grandmother    Stroke Maternal Grandmother    Stroke Paternal Grandmother    Heart attack Paternal Grandmother    Heart attack Maternal Grandfather    Stroke Maternal Grandfather    Stroke Paternal Grandfather    Heart attack Paternal Grandfather    Colon cancer Neg Hx    Pancreatic cancer Neg Hx    Stomach cancer Neg Hx    Esophageal cancer Neg Hx      Current Outpatient Medications (Cardiovascular):    cholestyramine (QUESTRAN) 4 g packet, DISSOLVE & TAKE 1 PACKET BY MOUTH TWICE DAILY   furosemide (LASIX) 20 MG tablet, Take 1 tablet (20 mg total) by mouth daily.   losartan (COZAAR) 50 MG tablet, Take 1 tablet (50 mg total) by mouth daily.   metoprolol succinate (TOPROL-XL) 25 MG 24 hr tablet, Take 1 tablet (25 mg total) by mouth daily.  Current Outpatient  Medications (Respiratory):    fexofenadine (ALLEGRA) 180 MG tablet, Take 180 mg by mouth daily.   fluticasone (FLONASE) 50 MCG/ACT nasal spray, Use 1 spray(s) in each nostril twice daily  Current Outpatient Medications (Analgesics):    acetaminophen (TYLENOL) 650 MG CR tablet, Take 650 mg by mouth every 8 (eight) hours as needed for pain.   aspirin EC 325 MG tablet, Take 325 mg by mouth daily.  Current Outpatient Medications (Hematological):    IRON PO, Take 65 mg by mouth 3 (three) times a  week.  Current Outpatient Medications (Other):    ALPRAZolam (XANAX) 0.5 MG tablet, TAKE 1 TABLET BY MOUTH ONCE DAILY AS NEEDED FOR ANXIETY   dicyclomine (BENTYL) 10 MG capsule, Take 1 capsule by mouth twice daily   diphenoxylate-atropine (LOMOTIL) 2.5-0.025 MG tablet, TAKE 1 TABLET BY MOUTH TWICE DAILY AS NEEDED FOR DIARRHEA OR LOOSE STOOLS   DULoxetine (CYMBALTA) 20 MG capsule, Take 1 capsule (20 mg total) by mouth daily.   famotidine (PEPCID) 20 MG tablet, Take 1 tablet by mouth once daily   potassium chloride SA (KLOR-CON M) 20 MEQ tablet, Take 1 tablet (20 mEq total) by mouth daily.   Vitamin D, Ergocalciferol, (DRISDOL) 1.25 MG (50000 UNIT) CAPS capsule, Take 1 capsule by mouth once a week   Reviewed prior external information including notes and imaging from  primary care provider As well as notes that were available from care everywhere and other healthcare systems.  Past medical history, social, surgical and family history all reviewed in electronic medical record.  No pertanent information unless stated regarding to the chief complaint.   Review of Systems:  No headache, visual changes, nausea, vomiting, diarrhea, constipation, dizziness, abdominal pain, skin rash, fevers, chills, night sweats, weight loss, swollen lymph nodes,  chest pain, shortness of breath, mood changes. POSITIVE muscle aches, body aches, joint swelling  Objective  Blood pressure (!) 152/74, pulse 78, height 5' (1.524  m), weight 106 lb (48.1 kg), SpO2 96%.   General: No apparent distress alert and oriented x3 mood and affect normal, dressed appropriately.  HEENT: Pupils equal, extraocular movements intact  Respiratory: Patient's speak in full sentences and does not appear short of breath  Cardiovascular: No lower extremity edema, non tender, no erythema  Back exam shows loss of lordosis.  Ambulates very carefully with the aid of a rolling walker Knee exam does have significant arthritic changes noted.  After informed written and verbal consent, patient was seated on exam table. Right knee was prepped with alcohol swab and utilizing anterolateral approach, patient's right knee space was injected with 4:1  marcaine 0.5%: Kenalog 40mg /dL. Patient tolerated the procedure well without immediate complications.  After informed written and verbal consent, patient was seated on exam table. Left knee was prepped with alcohol swab and utilizing anterolateral approach, patient's left knee space was injected with 4:1  marcaine 0.5%: Kenalog 40mg /dL. Patient tolerated the procedure well without immediate complications.   Impression and Recommendations:    The above documentation has been reviewed and is accurate and complete Judi Saa, DO

## 2024-02-27 ENCOUNTER — Ambulatory Visit: Payer: Medicare Other | Admitting: Family Medicine

## 2024-02-27 ENCOUNTER — Encounter: Payer: Self-pay | Admitting: Family Medicine

## 2024-02-27 VITALS — BP 152/74 | HR 78 | Ht 60.0 in | Wt 106.0 lb

## 2024-02-27 DIAGNOSIS — M170 Bilateral primary osteoarthritis of knee: Secondary | ICD-10-CM | POA: Diagnosis not present

## 2024-02-27 NOTE — Patient Instructions (Addendum)
 Injected both knees today See me again in 10-12 weeks

## 2024-02-27 NOTE — Assessment & Plan Note (Addendum)
 Bilateral injections given today and tolerated the procedure well, discussed icing regimen and home exercises, discussed which activities to do and which ones to avoid.  Increase activity slowly as well.  Follow-up again in10 weeks otherwise.  Social determinants of health including patient decreased physical inactivity secondary to pain, balance, and age.

## 2024-03-19 NOTE — Progress Notes (Unsigned)
 Cardiology Office Note:  .   Date:  03/20/2024  ID:  Susan [name], DOB [birth year], MRN [id number] PCP: Kandace Organ, NP  [facility] Providers Cardiologist:  Sheryle Donning, MD    Patient Profile: .      PMH Labile blood pressure Mitral regurgitation Chronic venous insufficiency  Hard of hearing  Dementia  Referred to cardiology and seen by Dr. Veryl Gottron 10/19/2023 for evaluation of chest pain.  She had been hospitalized August 2020 for for leg cellulitis.  She reported chest pains the week prior, lasting several hours.  Pain described as sharp, in her central chest, nonradiating and nonexertional.  No associated symptoms with chest pain.  Echo 08/21/2023 revealed EF 55 to 60%, moderate MR, moderate TR, no AS.  Prior stress test in 2017 was unremarkable.  Chest pain not consistent with cardiac etiology.  ER precautions were advised.  She did not have symptoms of volume overload and was advised to follow-up as needed.  ER visit February 2025.  Cardiology was consulted due to chest pain, mildly elevated troponins. Hs troponins 25>>31. Systolic BP was 195 mmHg upon arrival.  She reported chest pain resolved with administration of BP medication.  She was advised to start hydrochlorothiazide 25 mg every morning and restart Lasix 20 mg every morning as well.  Advised to take K-Dur 20 milliequivalents for 7 days and continue losartan 50 mg every afternoon.  BNP was elevated but CXR was unremarkable.  Seen in cardiology clinic 01/30/2024 by Dr. Veryl Gottron.  Her daughter reported BP as high as 250/110.  The day after her ER visit, BP was 107/72 without taking hydrochlorothiazide.  BP had been as low as 87/66 since that time.  She was advised to continue losartan 50 mg daily, metoprolol succinate 25 mg daily, and only take Lasix 20 mg daily if BP stable.  She was advised to return in 6 to 8 weeks.        History of Present Illness: .    History of Present  Illness Susan [name] is a very pleasant 89 y.o. female who is here with her daughter today for follow-up of hypertension. She has a log of recent BP readings that are over all well controlled. She has a few elevated BP readings, but no consistent pattern of BP > 140/80. She typically wakes up around 6:30-7:30 AM, eats breakfast,  takes medication, then rests and checks BP. She is feeling well and denies symptoms of lightheadedness, weakness, fatigue, presyncope or syncope. She is able to perform daily activities, including going to the store with her family and doing light housework. She reports occasional chest discomfort, described as a hard heartbeat, but denies severe or worrisome chest pain. No shortness of breath, orthopnea, PND, or edema.   Discussed the use of AI scribe software for clinical note transcription with the patient, who gave verbal consent to proceed.   ROS: See HPI       Studies Reviewed: Aaron Aas        No results found for: "LIPOA"   Risk Assessment/Calculations:             Physical Exam:   VS:  BP 120/72 (BP Location: Left Arm, Patient Position: Sitting, Cuff Size: Normal)   Pulse (!) 56   Ht 5' (1.524 m)   Wt 105 lb 8 oz (47.9 kg)   SpO2 95%   BMI 20.60 kg/m    Wt Readings from Last 3 Encounters:  03/20/24 105 lb 8 oz (47.9  kg)  02/27/24 106 lb (48.1 kg)  01/30/24 104 lb 6.4 oz (47.4 kg)    GEN: Elderly, frail in no acute distress NECK: No JVD; No carotid bruits CARDIAC: RRR, no murmurs, rubs, gallops RESPIRATORY:  Clear to auscultation without rales, wheezing or rhonchi  ABDOMEN: Soft, non-tender, non-distended EXTREMITIES:  No edema; No deformity     ASSESSMENT AND PLAN: .    Assessment and Plan Assessment & Plan Valve disease   Echo 08/21/2023 revealed moderate MR, moderate TR, no significant aortic valve disease. Normal LVEF. She is asymptomatic. Advised pt and family of symptoms to monitor for such as chest pain, shortness of breath, or  palpitations. Notify us  for symptom management.   Hypertension   Clinic BP is stable. Home BP is well controlled. She has a few labile readings but overall BP is stable. No lightheadedness or dizziness. Continue losartan and metoprolol. Renal function stable on labs completed 01/07/2024. No change in anti-hypertensive therapy today.   Peripheral edema/Chronic venous insufficiency She has mild left ankle swelling for which she takes Lasix with is effective in managing fluid retention. Continue Lasix and monitor for increased swelling or other symptoms of fluid retention.  Follow-up   A follow-up appointment is scheduled with primary care in May. The cardiology team is available for consultation as needed. Contact the cardiology team for any cardiac concerns.         Disposition:Follow-up as needed  Signed, Slater Duncan, NP-C

## 2024-03-20 ENCOUNTER — Encounter (HOSPITAL_BASED_OUTPATIENT_CLINIC_OR_DEPARTMENT_OTHER): Payer: Self-pay | Admitting: Nurse Practitioner

## 2024-03-20 ENCOUNTER — Ambulatory Visit (INDEPENDENT_AMBULATORY_CARE_PROVIDER_SITE_OTHER): Payer: Medicare Other | Admitting: Nurse Practitioner

## 2024-03-20 VITALS — BP 120/72 | HR 56 | Ht 60.0 in | Wt 105.5 lb

## 2024-03-20 DIAGNOSIS — I340 Nonrheumatic mitral (valve) insufficiency: Secondary | ICD-10-CM | POA: Diagnosis not present

## 2024-03-20 DIAGNOSIS — R600 Localized edema: Secondary | ICD-10-CM | POA: Diagnosis not present

## 2024-03-20 DIAGNOSIS — I10 Essential (primary) hypertension: Secondary | ICD-10-CM | POA: Diagnosis not present

## 2024-03-20 DIAGNOSIS — R0989 Other specified symptoms and signs involving the circulatory and respiratory systems: Secondary | ICD-10-CM

## 2024-03-20 DIAGNOSIS — I872 Venous insufficiency (chronic) (peripheral): Secondary | ICD-10-CM | POA: Diagnosis not present

## 2024-03-20 NOTE — Patient Instructions (Signed)
 Medication Instructions:   Your physician recommends that you continue on your current medications as directed. Please refer to the Current Medication list given to you today.  *If you need a refill on your cardiac medications before your next appointment, please call your pharmacy*  Lab Work:  None ordered.  If you have labs (blood work) drawn today and your tests are completely normal, you will receive your results only by: MyChart Message (if you have MyChart) OR A paper copy in the mail If you have any lab test that is abnormal or we need to change your treatment, we will call you to review the results.  Testing/Procedures:  None ordered.  Follow-Up: At [facility], you and your health needs are our priority.  As part of our continuing mission to provide you with exceptional heart care, our providers are all part of one team.  This team includes your primary Cardiologist (physician) and Advanced Practice Providers or APPs (Physician Assistants and Nurse Practitioners) who all work together to provide you with the care you need, when you need it.  Your next appointment:   Dr. Veryl Gottron  as needed.   Provider:   Sheryle Donning, MD    We recommend signing up for the patient portal called "MyChart".  Sign up information is provided on this After Visit Summary.  MyChart is used to connect with patients for Virtual Visits (Telemedicine).  Patients are able to view lab/test results, encounter notes, upcoming appointments, etc.  Non-urgent messages can be sent to your provider as well.   To learn more about what you can do with MyChart, go to [email or web address].   Other Instructions  As needed.

## 2024-03-25 ENCOUNTER — Other Ambulatory Visit: Payer: Self-pay | Admitting: Internal Medicine

## 2024-03-29 ENCOUNTER — Other Ambulatory Visit: Payer: Self-pay | Admitting: Internal Medicine

## 2024-04-04 ENCOUNTER — Ambulatory Visit (INDEPENDENT_AMBULATORY_CARE_PROVIDER_SITE_OTHER): Payer: Medicare Other | Admitting: Nurse Practitioner

## 2024-04-04 ENCOUNTER — Encounter: Payer: Self-pay | Admitting: Nurse Practitioner

## 2024-04-04 VITALS — BP 138/82 | HR 60 | Temp 98.0°F | Ht 60.0 in | Wt 103.4 lb

## 2024-04-04 DIAGNOSIS — K529 Noninfective gastroenteritis and colitis, unspecified: Secondary | ICD-10-CM | POA: Diagnosis not present

## 2024-04-04 DIAGNOSIS — I10 Essential (primary) hypertension: Secondary | ICD-10-CM | POA: Diagnosis not present

## 2024-04-04 NOTE — Patient Instructions (Addendum)
 Chobani protein drink Boiled eggs Whole grain or Ezekiel toast Avocado Ok to use pedialyte or electrolyte packs in water with increased diarrhea frequency.  Maintain med doses

## 2024-04-04 NOTE — Assessment & Plan Note (Signed)
 Acute on chronic, now resolved, denies any new symptoms Current use of dicyclomine  BID, lomotil  BID and questran  BID.  Encouraged to maintain a high fiber diet and adequate oral hydration Maintain med doses

## 2024-04-04 NOTE — Assessment & Plan Note (Signed)
 Home BP readings: 130s-110s/70s-60s, HR 55-70 Compliant with metoprolol , furosemide  and losartan  She had eval by cardiology. No change in regimen BP Readings from Last 3 Encounters:  04/04/24 138/82  03/20/24 120/72  02/27/24 (!) 152/74    Maintain med doses. F/up in 6months

## 2024-04-04 NOTE — Progress Notes (Signed)
 Established Patient Visit  Patient: Susan [name]   DOB: [birth year]   89 y.o. Female  MRN: [id number] Visit Date: 04/04/2024  Subjective:    Chief Complaint  Patient presents with   Follow-up    3 month f/u    Diarrhea    Bothering for 2 weeks    Diarrhea    Accompanied by daughter Essential hypertension Home BP readings: 130s-110s/70s-60s, HR 55-70 Compliant with metoprolol , furosemide  and losartan  She had eval by cardiology. No change in regimen BP Readings from Last 3 Encounters:  04/04/24 138/82  03/20/24 120/72  02/27/24 (!) 152/74    Maintain med doses. F/up in 6months  Chronic diarrhea Acute on chronic, now resolved, denies any new symptoms Current use of dicyclomine  BID, lomotil  BID and questran  BID.  Encouraged to maintain a high fiber diet and adequate oral hydration Maintain med doses  Chronic  Improved diarrhea frequency.  Wt Readings from Last 3 Encounters:  04/04/24 103 lb 6.4 oz (46.9 kg)  03/20/24 105 lb 8 oz (47.9 kg)  02/27/24 106 lb (48.1 kg)    Reviewed medical, surgical, and social history today  Medications: Outpatient Medications Prior to Visit  Medication Sig   acetaminophen  (TYLENOL ) 650 MG CR tablet Take 650 mg by mouth every 8 (eight) hours as needed for pain.   ALPRAZolam  (XANAX ) 0.5 MG tablet TAKE 1 TABLET BY MOUTH ONCE DAILY AS NEEDED FOR ANXIETY   aspirin  EC 325 MG tablet Take 325 mg by mouth daily.   cholestyramine  (QUESTRAN ) 4 g packet DISSOLVE & TAKE 1 POWDER BY MOUTH TWICE DAILY   dicyclomine  (BENTYL ) 10 MG capsule Take 1 capsule by mouth twice daily   diphenoxylate -atropine  (LOMOTIL ) 2.5-0.025 MG tablet TAKE 1 TABLET BY MOUTH TWICE DAILY AS NEEDED FOR  DIARRHEA  OR  LOOSE  STOOLS   DULoxetine  (CYMBALTA ) 20 MG capsule Take 1 capsule (20 mg total) by mouth daily.   famotidine  (PEPCID ) 20 MG tablet Take 1 tablet by mouth once daily   furosemide  (LASIX ) 20 MG tablet Take 1 tablet (20 mg total) by mouth  daily.   IRON PO Take 65 mg by mouth 3 (three) times a week.   losartan  (COZAAR ) 50 MG tablet Take 1 tablet (50 mg total) by mouth daily.   metoprolol  succinate (TOPROL -XL) 25 MG 24 hr tablet Take 1 tablet (25 mg total) by mouth daily.   potassium chloride  SA (KLOR-CON  M) 20 MEQ tablet Take 1 tablet (20 mEq total) by mouth daily.   Vitamin D , Ergocalciferol , (DRISDOL ) 1.25 MG (50000 UNIT) CAPS capsule Take 1 capsule by mouth once a week   fexofenadine (ALLEGRA) 180 MG tablet Take 180 mg by mouth daily.   fluticasone  (FLONASE ) 50 MCG/ACT nasal spray Use 1 spray(s) in each nostril twice daily   No facility-administered medications prior to visit.   Reviewed past medical and social history.   ROS per HPI above  Last CBC Lab Results  Component Value Date   WBC 6.6 01/23/2024   HGB 13.0 01/23/2024   HCT 40.1 01/23/2024   MCV 93.0 01/23/2024   MCH 30.2 01/23/2024   RDW 13.7 01/23/2024   PLT 256 01/23/2024   Last metabolic panel Lab Results  Component Value Date   GLUCOSE 108 (H) 01/29/2024   NA 141 01/29/2024   K 3.6 01/29/2024   CL 97 01/29/2024   CO2 35 (H) 01/29/2024   BUN 33 (H) 01/29/2024  CREATININE 0.68 01/29/2024   GFR 73.12 01/29/2024   CALCIUM  9.5 01/29/2024   PHOS 3.9 01/29/2024   PROT 6.9 01/23/2024   ALBUMIN 3.9 01/29/2024   BILITOT 0.4 01/23/2024   ALKPHOS 57 01/23/2024   AST 26 01/23/2024   ALT 20 01/23/2024   ANIONGAP 10 01/23/2024   Last hemoglobin A1c Lab Results  Component Value Date   HGBA1C 5.5 01/07/2024   Last thyroid  functions Lab Results  Component Value Date   TSH 2.50 12/28/2021        Objective:  BP 138/82 (BP Location: Left Arm, Patient Position: Sitting, Cuff Size: Small)   Pulse 60   Temp 98 F (36.7 C) (Temporal)   Ht 5' (1.524 m)   Wt 103 lb 6.4 oz (46.9 kg)   SpO2 97%   BMI 20.19 kg/m      Physical Exam Vitals and nursing note reviewed.  Cardiovascular:     Rate and Rhythm: Normal rate and regular rhythm.      Pulses: Normal pulses.     Heart sounds: Normal heart sounds.  Pulmonary:     Effort: Pulmonary effort is normal.     Breath sounds: Normal breath sounds.  Abdominal:     General: Bowel sounds are normal. There is no distension.     Palpations: Abdomen is soft.     Tenderness: There is no abdominal tenderness. There is no guarding.  Neurological:     Mental Status: She is alert and oriented to person, place, and time.     No results found for any visits on 04/04/24.    Assessment & Plan:    Problem List Items Addressed This Visit     Chronic diarrhea   Acute on chronic, now resolved, denies any new symptoms Current use of dicyclomine  BID, lomotil  BID and questran  BID.  Encouraged to maintain a high fiber diet and adequate oral hydration Maintain med doses       Essential hypertension - Primary   Home BP readings: 130s-110s/70s-60s, HR 55-70 Compliant with metoprolol , furosemide  and losartan  She had eval by cardiology. No change in regimen BP Readings from Last 3 Encounters:  04/04/24 138/82  03/20/24 120/72  02/27/24 (!) 152/74    Maintain med doses. F/up in 6months      Return in about 6 months (around 10/05/2024) for HTN, hyperlipidemia (fasting).     Kathrene Parents, NP

## 2024-04-16 ENCOUNTER — Other Ambulatory Visit: Payer: Self-pay | Admitting: Internal Medicine

## 2024-04-16 ENCOUNTER — Other Ambulatory Visit: Payer: Self-pay | Admitting: Nurse Practitioner

## 2024-04-16 DIAGNOSIS — F411 Generalized anxiety disorder: Secondary | ICD-10-CM

## 2024-04-16 NOTE — Telephone Encounter (Signed)
 Medication: Alprazolam  (Xanax ) 0.5 mg  Directions: Take 1 tablet by mouth as needed for anxiety  Last given: 10/12/23 Number refills: 5 Last o/v: 04/04/24 Follow up: 6 months  Labs:

## 2024-04-18 NOTE — Telephone Encounter (Signed)
Refilled Lomotil 

## 2024-04-24 ENCOUNTER — Encounter: Payer: Self-pay | Admitting: Nurse Practitioner

## 2024-04-29 ENCOUNTER — Other Ambulatory Visit: Payer: Self-pay | Admitting: Family Medicine

## 2024-04-29 ENCOUNTER — Other Ambulatory Visit: Payer: Self-pay | Admitting: Nurse Practitioner

## 2024-04-29 DIAGNOSIS — K529 Noninfective gastroenteritis and colitis, unspecified: Secondary | ICD-10-CM

## 2024-04-30 ENCOUNTER — Ambulatory Visit: Payer: Medicare Other | Admitting: Nurse Practitioner

## 2024-05-06 NOTE — Progress Notes (Unsigned)
 [name] [address] [place] [address] Phone: [phone] Subjective:   Susan [name] am a scribe for Dr. Felipe Horton.   I'm seeing this patient by the request  of:  Nche, Connye Delaine, NP  CC: Bilateral knee pain  [id number]  02/27/2024 Bilateral injections given today and tolerated the procedure well, discussed icing regimen and home exercises, discussed which activities to do and which ones to avoid.  Increase activity slowly as well.  Follow-up again in10 weeks otherwise.  Social determinants of health including patient decreased physical inactivity secondary to pain, balance, and age.     Updated 05/06/2024 Susan [name] is a 89 y.o. female coming in with complaint of B knee pain. Patient states fairly good today.  Known significant arthritic changes.  Has noticed sometimes that the knees do talk to her.  Does not do a lot of activity but would not say that the knees have stopped her from very much but sometimes she is very aware of it at the end of the long day      Past Medical History:  Diagnosis Date   Actinic keratosis, hx of    chest wall (2012)   Allergic rhinitis    Anxiety    Arthritis    "spine, hips" (01/30/2017)   Bursitis    Cellulitis 07/20/2023   Cellulitis of right foot 01/30/2017   Colon polyps    Diverticulosis    Elevated brain natriuretic peptide (BNP) level 01/23/2024   Elevated troponin 01/30/2017   Esophageal dysmotilities 10/16/2017   Fibromyalgia    "qwhere" (01/30/2017)   Gallstones    GERD (gastroesophageal reflux disease)    High cholesterol    History of kidney stones    History of stomach ulcers    Hx of squamous cell carcinoma excision    right mandible (2011), right arm (2014)   Hypertension    Hypertensive urgency 01/23/2024   Hyponatremia 07/20/2023   IBS (irritable bowel syndrome)    Lymphangitis 12/06/2018   Osteoarthritis    Pneumonia 2000s X 2   "twice" (01/30/2017)    Pyloric stenosis    Shortness of breath 01/23/2024   Stroke (HCC) 10/2014   denies residual on 01/30/2017   Past Surgical History:  Procedure Laterality Date   APPENDECTOMY     CARDIOVASCULAR STRESS TEST  04/2016   nuclear cardiolite stress test done in [place]  (no ischemia, no LVH, no LV systolic function)   CATARACT EXTRACTION W/ INTRAOCULAR LENS  IMPLANT, BILATERAL Bilateral    CHOLECYSTECTOMY OPEN     COLONOSCOPY  2009, 2014   DIAGNOSTIC MAMMOGRAM  2009, 2012   TONSILLECTOMY     Social History   Socioeconomic History   Marital status: Widowed    Spouse name: Not on file   Number of children: 4   Years of education: Not on file   Highest education level: Not on file  Occupational History   Occupation: retired  Tobacco Use   Smoking status: Never   Smokeless tobacco: Never  Vaping Use   Vaping status: Never Used  Substance and Sexual Activity   Alcohol use: Not Currently    Comment: glass of red wine maybe once week   Drug use: No   Sexual activity: Not on file  Other Topics Concern   Not on file  Social History Narrative   Not on file   Social Drivers of Health   Financial Resource Strain: Low Risk  (05/25/2023)   Overall  Financial Resource Strain (CARDIA)    Difficulty of Paying Living Expenses: Not hard at all  Food Insecurity: No Food Insecurity (07/27/2023)   Hunger Vital Sign    Worried About Running Out of Food in the Last Year: Never true    Ran Out of Food in the Last Year: Never true  Transportation Needs: No Transportation Needs (07/20/2023)   PRAPARE - Administrator, Civil Service (Medical): No    Lack of Transportation (Non-Medical): No  Physical Activity: Inactive (05/25/2023)   Exercise Vital Sign    Days of Exercise per Week: 0 days    Minutes of Exercise per Session: 0 min  Stress: No Stress Concern Present (05/25/2023)   [employer] of Occupational Health - Occupational Stress Questionnaire    Feeling of Stress : Only a  little  Social Connections: Patient Declined (05/25/2023)   Social Connection and Isolation Panel [NHANES]    Frequency of Communication with Friends and Family: Patient declined    Frequency of Social Gatherings with Friends and Family: Patient declined    Attends Religious Services: Patient declined    Database administrator or Organizations: Patient declined    Attends Engineer, structural: Patient declined    Marital Status: Patient declined   Allergies  Allergen Reactions   Butrans [Buprenorphine] Other (See Comments)    Severe chest pains   Codeine Other (See Comments)    Severe Chest pains  Other Reaction(s): severe chest pain   Morphine And Codeine Other (See Comments)    Severe chest pains   Morphine Sulfate     Other Reaction(s): severe chest pain    Family History  Problem Relation Age of Onset   Stroke Mother    Heart attack Mother    Stroke Father    Heart attack Father    Heart attack Maternal Grandmother    Stroke Maternal Grandmother    Stroke Paternal Grandmother    Heart attack Paternal Grandmother    Heart attack Maternal Grandfather    Stroke Maternal Grandfather    Stroke Paternal Grandfather    Heart attack Paternal Grandfather    Colon cancer Neg Hx    Pancreatic cancer Neg Hx    Stomach cancer Neg Hx    Esophageal cancer Neg Hx      Current Outpatient Medications (Cardiovascular):    cholestyramine  (QUESTRAN ) 4 g packet, DISSOLVE & TAKE 1 POWDER BY MOUTH TWICE DAILY   furosemide  (LASIX ) 20 MG tablet, Take 1 tablet (20 mg total) by mouth daily.   losartan  (COZAAR ) 50 MG tablet, Take 1 tablet (50 mg total) by mouth daily.   metoprolol  succinate (TOPROL -XL) 25 MG 24 hr tablet, Take 1 tablet (25 mg total) by mouth daily.  Current Outpatient Medications (Respiratory):    fexofenadine (ALLEGRA) 180 MG tablet, Take 180 mg by mouth daily.   fluticasone  (FLONASE ) 50 MCG/ACT nasal spray, Use 1 spray(s) in each nostril twice daily  Current  Outpatient Medications (Analgesics):    acetaminophen  (TYLENOL ) 650 MG CR tablet, Take 650 mg by mouth every 8 (eight) hours as needed for pain.   aspirin  EC 325 MG tablet, Take 325 mg by mouth daily.  Current Outpatient Medications (Hematological):    IRON PO, Take 65 mg by mouth 3 (three) times a week.  Current Outpatient Medications (Other):    ALPRAZolam  (XANAX ) 0.5 MG tablet, TAKE 1 TABLET BY MOUTH ONCE DAILY AS NEEDED FOR ANXIETY   dicyclomine  (BENTYL ) 10 MG capsule, Take 1  capsule by mouth twice daily   diphenoxylate -atropine  (LOMOTIL ) 2.5-0.025 MG tablet, TAKE 1 TABLET BY MOUTH TWICE DAILY AS NEEDED FOR DIARRHEA OR LOOSE STOOLS   DULoxetine  (CYMBALTA ) 20 MG capsule, Take 1 capsule (20 mg total) by mouth daily.   famotidine  (PEPCID ) 20 MG tablet, Take 1 tablet by mouth once daily   potassium chloride  SA (KLOR-CON  M) 20 MEQ tablet, Take 1 tablet (20 mEq total) by mouth daily.   Vitamin D , Ergocalciferol , (DRISDOL ) 1.25 MG (50000 UNIT) CAPS capsule, Take 1 capsule by mouth once a week   Reviewed prior external information including notes and imaging from  primary care provider As well as notes that were available from care everywhere and other healthcare systems.  Past medical history, social, surgical and family history all reviewed in electronic medical record.  No pertanent information unless stated regarding to the chief complaint.   Review of Systems:  No headache, visual changes, nausea, vomiting, diarrhea, constipation, dizziness, abdominal pain, skin rash, fevers, chills, night sweats, weight loss, swollen lymph nodes, body aches, joint swelling, chest pain, shortness of breath, mood changes. POSITIVE muscle aches  Objective  Blood pressure 132/80, pulse (!) 105, height 5' (1.524 m), weight 103 lb (46.7 kg), SpO2 94%.   General: No apparent distress alert and oriented x3 mood and affect normal, dressed appropriately.  HEENT: Pupils equal, extraocular movements intact   Respiratory: Patient's speak in full sentences and does not appear antalgic gait noted today.  Patient's knees do have some instability with valgus and varus force.  Patient is tender to palpation diffusely on the knees  After informed written and verbal consent, patient was seated on exam table. Right knee was prepped with alcohol swab and utilizing anterolateral approach, patient's right knee space was injected with 4:1  marcaine 0.5%: Kenalog  40mg /dL. Patient tolerated the procedure well without immediate complications.  After informed written and verbal consent, patient was seated on exam table. Left knee was prepped with alcohol swab and utilizing anterolateral approach, patient's left knee space was injected with 4:1  marcaine 0.5%: Kenalog  40mg /dL. Patient tolerated the procedure well without immediate complications.     Impression and Recommendations:     The above documentation has been reviewed and is accurate and complete Kalika Smay M Vonzella Althaus, DO

## 2024-05-07 ENCOUNTER — Ambulatory Visit (INDEPENDENT_AMBULATORY_CARE_PROVIDER_SITE_OTHER): Admitting: Family Medicine

## 2024-05-07 ENCOUNTER — Encounter: Payer: Self-pay | Admitting: Family Medicine

## 2024-05-07 VITALS — BP 132/80 | HR 105 | Ht 60.0 in | Wt 103.0 lb

## 2024-05-07 DIAGNOSIS — M170 Bilateral primary osteoarthritis of knee: Secondary | ICD-10-CM

## 2024-05-07 NOTE — Patient Instructions (Signed)
 Good to see you! Injections in knees Stay out of trouble See you again in 10-12 weeks

## 2024-05-07 NOTE — Assessment & Plan Note (Signed)
 Discussed icing regimen and home exercises, discussed which activities to do and which ones to avoid.  Continue ambulatory with the use of a walker.  Follow-up again in 8 to 12 weeks

## 2024-05-20 ENCOUNTER — Other Ambulatory Visit: Payer: Self-pay | Admitting: Internal Medicine

## 2024-05-26 ENCOUNTER — Ambulatory Visit (INDEPENDENT_AMBULATORY_CARE_PROVIDER_SITE_OTHER): Payer: Medicare Other

## 2024-05-26 DIAGNOSIS — Z0000 Encounter for general adult medical examination without abnormal findings: Secondary | ICD-10-CM

## 2024-05-26 NOTE — Progress Notes (Signed)
 Subjective:   Susan [name] is a 89 y.o. who presents for a Medicare Wellness preventive visit.  As a reminder, Annual Wellness Visits don't include a physical exam, and some assessments may be limited, especially if this visit is performed virtually. We may recommend an in-person follow-up visit with your provider if needed.  Visit Complete: Virtual I connected with  Susan [name] on 05/26/24 by a audio enabled telemedicine application and verified that I am speaking with the correct person using two identifiers.  Patient Location: Home  Provider Location: Office/Clinic  I discussed the limitations of evaluation and management by telemedicine. The patient expressed understanding and agreed to proceed.  Vital Signs: Because this visit was a virtual/telehealth visit, some criteria may be missing or patient reported. Any vitals not documented were not able to be obtained and vitals that have been documented are patient reported.  VideoError- Librarian, academic were attempted between this provider and patient, however failed, due to patient having technical difficulties OR patient did not have access to video capability.  We continued and completed visit with audio only.   Persons Participating in Visit: Patient.  AWV Questionnaire: No: Patient Medicare AWV questionnaire was not completed prior to this visit.  Cardiac Risk Factors include: advanced age (>30men, >57 women);dyslipidemia;hypertension     Objective:    Today's Vitals   There is no height or weight on file to calculate BMI.     05/26/2024   11:00 AM 07/19/2023    6:47 PM 05/25/2023   11:24 AM 05/23/2022   11:53 AM 06/30/2021   11:33 AM 05/17/2021    1:02 PM 10/06/2020   11:55 AM  Advanced Directives  Does Patient Have a Medical Advance Directive? Yes No Yes Yes Yes Yes Yes  Type of Estate agent of [place];Living will  Healthcare Power of [place];Living  will Healthcare Power of [place];Living will Out of facility DNR (pink MOST or yellow form)  Healthcare Power of [place];Living will Healthcare Power of [place];Living will  Does patient want to make changes to medical advance directive?   No - Patient declined      Copy of Healthcare Power of Attorney in Chart? Yes - validated most recent copy scanned in chart (See row information)  Yes - validated most recent copy scanned in chart (See row information) No - copy requested  Yes - validated most recent copy scanned in chart (See row information)   Would patient like information on creating a medical advance directive?  No - Patient declined          Significant value    Current Medications (verified) Outpatient Encounter Medications as of 05/26/2024  Medication Sig   acetaminophen  (TYLENOL ) 650 MG CR tablet Take 650 mg by mouth every 8 (eight) hours as needed for pain.   ALPRAZolam  (XANAX ) 0.5 MG tablet TAKE 1 TABLET BY MOUTH ONCE DAILY AS NEEDED FOR ANXIETY   aspirin  EC 81 MG tablet Take 81 mg by mouth daily. Swallow whole.   cholestyramine  (QUESTRAN ) 4 g packet DISSOLVE & TAKE 1 POWDER BY MOUTH TWICE DAILY   dicyclomine  (BENTYL ) 10 MG capsule Take 1 capsule by mouth twice daily   diphenoxylate -atropine  (LOMOTIL ) 2.5-0.025 MG tablet TAKE 1 TABLET BY MOUTH TWICE DAILY AS NEEDED FOR DIARRHEA OR LOOSE STOOLS   DULoxetine  (CYMBALTA ) 20 MG capsule Take 1 capsule (20 mg total) by mouth daily.   famotidine  (PEPCID ) 20 MG tablet Take 1 tablet by mouth once daily  fexofenadine (ALLEGRA) 180 MG tablet Take 180 mg by mouth daily.   furosemide  (LASIX ) 20 MG tablet Take 1 tablet (20 mg total) by mouth daily.   IRON PO Take 65 mg by mouth 3 (three) times a week.   losartan  (COZAAR ) 50 MG tablet Take 1 tablet (50 mg total) by mouth daily.   metoprolol  succinate (TOPROL -XL) 25 MG 24 hr tablet Take 1 tablet (25 mg total) by mouth daily.   potassium chloride  SA (KLOR-CON  M) 20 MEQ tablet Take 1 tablet (20  mEq total) by mouth daily.   Vitamin D , Ergocalciferol , (DRISDOL ) 1.25 MG (50000 UNIT) CAPS capsule Take 1 capsule by mouth once a week   aspirin  EC 325 MG tablet Take 325 mg by mouth daily. (Patient not taking: Reported on 05/26/2024)   fluticasone  (FLONASE ) 50 MCG/ACT nasal spray Use 1 spray(s) in each nostril twice daily (Patient not taking: Reported on 05/26/2024)   No facility-administered encounter medications on file as of 05/26/2024.    Allergies (verified) Butrans [buprenorphine], Codeine, Morphine and codeine, and Morphine sulfate   History: Past Medical History:  Diagnosis Date   Actinic keratosis, hx of    chest wall (2012)   Allergic rhinitis    Anxiety    Arthritis    spine, hips (01/30/2017)   Bursitis    Cellulitis 07/20/2023   Cellulitis of right foot 01/30/2017   Colon polyps    Diverticulosis    Elevated brain natriuretic peptide (BNP) level 01/23/2024   Elevated troponin 01/30/2017   Esophageal dysmotilities 10/16/2017   Fibromyalgia    qwhere (01/30/2017)   Gallstones    GERD (gastroesophageal reflux disease)    High cholesterol    History of kidney stones    History of stomach ulcers    Hx of squamous cell carcinoma excision    right mandible (2011), right arm (2014)   Hypertension    Hypertensive urgency 01/23/2024   Hyponatremia 07/20/2023   IBS (irritable bowel syndrome)    Lymphangitis 12/06/2018   Osteoarthritis    Pneumonia 2000s X 2   twice (01/30/2017)   Pyloric stenosis    Shortness of breath 01/23/2024   Stroke (HCC) 10/2014   denies residual on 01/30/2017   Past Surgical History:  Procedure Laterality Date   APPENDECTOMY     CARDIOVASCULAR STRESS TEST  04/2016   nuclear cardiolite stress test done in [place]  (no ischemia, no LVH, no LV systolic function)   CATARACT EXTRACTION W/ INTRAOCULAR LENS  IMPLANT, BILATERAL Bilateral    CHOLECYSTECTOMY OPEN     COLONOSCOPY  2009, 2014   DIAGNOSTIC MAMMOGRAM  2009, 2012   TONSILLECTOMY      Family History  Problem Relation Age of Onset   Stroke Mother    Heart attack Mother    Stroke Father    Heart attack Father    Heart attack Maternal Grandmother    Stroke Maternal Grandmother    Stroke Paternal Grandmother    Heart attack Paternal Grandmother    Heart attack Maternal Grandfather    Stroke Maternal Grandfather    Stroke Paternal Grandfather    Heart attack Paternal Grandfather    Colon cancer Neg Hx    Pancreatic cancer Neg Hx    Stomach cancer Neg Hx    Esophageal cancer Neg Hx    Social History   Socioeconomic History   Marital status: Widowed    Spouse name: Not on file   Number of children: 4   Years of education: Not on  file   Highest education level: Not on file  Occupational History   Occupation: retired  Tobacco Use   Smoking status: Never   Smokeless tobacco: Never  Vaping Use   Vaping status: Never Used  Substance and Sexual Activity   Alcohol use: Not Currently    Comment: glass of red wine maybe once week   Drug use: No   Sexual activity: Not on file  Other Topics Concern   Not on file  Social History Narrative   Not on file   Social Drivers of Health   Financial Resource Strain: Low Risk  (05/26/2024)   Overall Financial Resource Strain (CARDIA)    Difficulty of Paying Living Expenses: Not hard at all  Food Insecurity: No Food Insecurity (05/26/2024)   Hunger Vital Sign    Worried About Running Out of Food in the Last Year: Never true    Ran Out of Food in the Last Year: Never true  Transportation Needs: No Transportation Needs (05/26/2024)   PRAPARE - Administrator, Civil Service (Medical): No    Lack of Transportation (Non-Medical): No  Physical Activity: Inactive (05/26/2024)   Exercise Vital Sign    Days of Exercise per Week: 0 days    Minutes of Exercise per Session: 0 min  Stress: No Stress Concern Present (05/26/2024)   [employer] of Occupational Health - Occupational Stress Questionnaire     Feeling of Stress: Not at all  Social Connections: Socially Isolated (05/26/2024)   Social Connection and Isolation Panel    Frequency of Communication with Friends and Family: More than three times a week    Frequency of Social Gatherings with Friends and Family: Three times a week    Attends Religious Services: Never    Active Member of Clubs or Organizations: No    Attends Banker Meetings: Never    Marital Status: Widowed    Tobacco Counseling Counseling given: Not Answered    Clinical Intake:  Pre-visit preparation completed: Yes  Pain : No/denies pain     Nutritional Risks: None Diabetes: No  Lab Results  Component Value Date   HGBA1C 5.5 01/07/2024   HGBA1C 5.6 01/08/2023   HGBA1C 5.4 12/28/2021     How often do you need to have someone help you when you read instructions, pamphlets, or other written materials from your doctor or pharmacy?: 1 - Never  Interpreter Needed?: No  Information entered by :: NAllen LPN   Activities of Daily Living     05/26/2024   10:49 AM 07/20/2023    7:00 PM  In your present state of health, do you have any difficulty performing the following activities:  Hearing? 1   Comment wears hearing aids   Vision? 0   Difficulty concentrating or making decisions? 1   Comment some forgetfullness   Walking or climbing stairs? 1   Dressing or bathing? 0   Doing errands, shopping? 1 0  Preparing Food and eating ? N   Using the Toilet? N   In the past six months, have you accidently leaked urine? Y   Do you have problems with loss of bowel control? N   Managing your Medications? Y   Comment daughter sets up med box   Managing your Finances? N   Housekeeping or managing your Housekeeping? Y     Patient Care Team: Nche, Roselie Rockford, NP as PCP - General (Internal Medicine) Lonni Slain, MD as PCP - Cardiology (Cardiology)  I have  updated your Care Teams any recent Medical Services you may have received from  other providers in the past year.     Assessment:   This is a routine wellness examination for Susan [name].  Hearing/Vision screen Hearing Screening - Comments:: Has hearing aids that are maintained Vision Screening - Comments:: No regular eye exams   Goals Addressed             This Visit's Progress    Patient Stated       05/26/2024, denies goals       Depression Screen     05/26/2024   11:03 AM 01/07/2024   11:07 AM 01/07/2024   11:06 AM 08/15/2023    1:48 PM 05/25/2023   11:26 AM 01/08/2023    1:35 PM 01/08/2023    1:21 PM  PHQ 2/9 Scores  PHQ - 2 Score 0 4 4 2  0 2 2  PHQ- 9 Score 2 6  8  9      Fall Risk     05/26/2024   11:01 AM 01/07/2024   11:06 AM 08/15/2023    1:26 PM 05/25/2023   11:25 AM 01/08/2023    1:21 PM  Fall Risk   Falls in the past year? 1 1 0 0   Comment turned too quick      Number falls in past yr: 0 0 0 0 0  Injury with Fall? 0 0 0 0 0  Risk for fall due to : Impaired balance/gait;Impaired mobility;Medication side effect History of fall(s) History of fall(s) Medication side effect;Impaired mobility;Impaired balance/gait No Fall Risks  Follow up Falls prevention discussed;Falls evaluation completed Falls evaluation completed  Falls prevention discussed;Education provided;Falls evaluation completed     MEDICARE RISK AT HOME:  Medicare Risk at Home Any stairs in or around the home?: No If so, are there any without handrails?: No Home free of loose throw rugs in walkways, pet beds, electrical cords, etc?: Yes Adequate lighting in your home to reduce risk of falls?: Yes Life alert?: Yes Use of a cane, walker or w/c?: Yes Grab bars in the bathroom?: Yes Shower chair or bench in shower?: Yes Elevated toilet seat or a handicapped toilet?: No  TIMED UP AND GO:  Was the test performed?  No  Cognitive Function: 6CIT completed    01/07/2024    2:09 PM 01/08/2023    2:20 PM 05/16/2017   11:16 AM  MMSE - Mini Mental State Exam  Orientation to time 5 5 5     Orientation to Place 5 5 5    Registration 3 3 3    Attention/ Calculation 5 5 5    Recall 2 1 2    Language- name 2 objects 2 2 2    Language- repeat 1 1 1   Language- follow 3 step command 3 3 3    Language- read & follow direction 1 1 1    Write a sentence 1 1 1    Copy design 1 1 1    Total score 29 28 29       Data saved with a previous flowsheet row definition        05/26/2024   11:03 AM 05/25/2023   11:26 AM 05/17/2021    1:10 PM  6CIT Screen  What Year? 0 points 0 points 0 points  What month? 0 points 0 points 0 points  What time? 0 points 0 points 0 points  Count back from 20 0 points 0 points 2 points  Months in reverse 0 points 0 points 0 points  Repeat phrase 0 points 0 points 0 points  Total Score 0 points 0 points 2 points    Immunizations Immunization History  Administered Date(s) Administered   Fluad Quad(high Dose 65+) 08/15/2019, 09/03/2020   Influenza, High Dose Seasonal PF 10/16/2016, 09/07/2017, 09/11/2018   Influenza-Unspecified 08/12/2021, 08/11/2022   Moderna Covid-19 Fall Seasonal Vaccine 24yrs & older 10/04/2022   Moderna Covid-19 Vaccine  Bivalent Booster 41yrs & up 11/15/2021   Moderna Sars-Covid-2 Vaccination 12/20/2019, 01/20/2020, 09/30/2020, 04/12/2021   Pneumococcal Conjugate-13 05/16/2017   Pneumococcal-Unspecified 02/04/2004   Unspecified SARS-COV-2 Vaccination 01/03/2023    Screening Tests Health Maintenance  Topic Date Due   COVID-19 Vaccine (8 - 2024-25 season) 08/05/2023   INFLUENZA VACCINE  07/04/2024   Medicare Annual Wellness (AWV)  05/26/2025   DEXA SCAN  Completed   HPV VACCINES  Aged Out   Meningococcal B Vaccine  Aged Out   DTaP/Tdap/Td  Discontinued   Pneumococcal Vaccine: 50+ Years  Discontinued   Zoster Vaccines- Shingrix  Discontinued    Health Maintenance  Health Maintenance Due  Topic Date Due   COVID-19 Vaccine (8 - 2024-25 season) 08/05/2023   Health Maintenance Items Addressed: Due for covid  vaccine.  Additional Screening:  Vision Screening: Recommended annual ophthalmology exams for early detection of glaucoma and other disorders of the eye. Would you like a referral to an eye doctor? No    Dental Screening: Recommended annual dental exams for proper oral hygiene  Community Resource Referral / Chronic Care Management: CRR required this visit?  No   CCM required this visit?  No   Plan:    I have personally reviewed and noted the following in the patient's chart:   Medical and social history Use of alcohol, tobacco or illicit drugs  Current medications and supplements including opioid prescriptions. Patient is not currently taking opioid prescriptions. Functional ability and status Nutritional status Physical activity Advanced directives List of other physicians Hospitalizations, surgeries, and ER visits in previous 12 months Vitals Screenings to include cognitive, depression, and falls Referrals and appointments  In addition, I have reviewed and discussed with patient certain preventive protocols, quality metrics, and best practice recommendations. A written personalized care plan for preventive services as well as general preventive health recommendations were provided to patient.   Ardella FORBES Dawn, LPN   [id number]   After Visit Summary: (Pick Up) Due to this being a telephonic visit, with patients personalized plan was offered to patient and patient has requested to Pick up at office.  Notes: Nothing significant to report at this time.

## 2024-05-26 NOTE — Patient Instructions (Signed)
 Susan [name] , Thank you for taking time out of your busy schedule to complete your Annual Wellness Visit with me. I enjoyed our conversation and look forward to speaking with you again next year. I, as well as your care team,  appreciate your ongoing commitment to your health goals. Please review the following plan we discussed and let me know if I can assist you in the future. Your Game plan/ To Do List    Referrals: If you haven't heard from the office you've been referred to, please reach out to them at the phone provided.  N/a Follow up Visits: Next Medicare AWV with our clinical staff: 06/01/2025 at 10:10   Have you seen your provider in the last 6 months (3 months if uncontrolled diabetes)? Yes Next Office Visit with your provider: 10/08/2024 at 10:20  Clinician Recommendations:  Aim for 30 minutes of exercise or brisk walking, 6-8 glasses of water, and 5 servings of fruits and vegetables each day.       This is a list of the screening recommended for you and due dates:  Health Maintenance  Topic Date Due   COVID-19 Vaccine (8 - 2024-25 season) 08/05/2023   Flu Shot  07/04/2024   Medicare Annual Wellness Visit  05/26/2025   DEXA scan (bone density measurement)  Completed   HPV Vaccine  Aged Out   Meningitis B Vaccine  Aged Out   DTaP/Tdap/Td vaccine  Discontinued   Pneumococcal Vaccine for age over 72  Discontinued   Zoster (Shingles) Vaccine  Discontinued    Advanced directives: (In Chart) A copy of your advanced directives are scanned into your chart should your provider ever need it. Advance Care Planning is important because it:  [x]  Makes sure you receive the medical care that is consistent with your values, goals, and preferences  [x]  It provides guidance to your family and loved ones and reduces their decisional burden about whether or not they are making the right decisions based on your wishes.  Follow the link provided in your after visit summary or read over the  paperwork we have mailed to you to help you started getting your Advance Directives in place. If you need assistance in completing these, please reach out to us  so that we can help you!  See attachments for Preventive Care and Fall Prevention Tips.

## 2024-06-30 ENCOUNTER — Other Ambulatory Visit: Payer: Self-pay | Admitting: Internal Medicine

## 2024-07-02 NOTE — Telephone Encounter (Signed)
 Patient has not been seen since 2022.  Just like to check in from time to time to make sure its ok to refill

## 2024-07-09 ENCOUNTER — Other Ambulatory Visit: Payer: Self-pay | Admitting: Family Medicine

## 2024-07-15 NOTE — Progress Notes (Unsigned)
 [name] [address] [place] [address] Phone: [phone] Subjective:   LILLETTE Berwyn Posey, am serving as a scribe for Dr. Arthea Claudene.  I'm seeing this patient by the request  of:  Nche, Roselie Rockford, NP  CC: Bilateral knee pain  YEP:Dlagzrupcz  05/07/2024 Discussed icing regimen and home exercises, discussed which activities to do and which ones to avoid.  Continue ambulatory with the use of a walker.  Follow-up again in 8 to 12 weeks     Updated 07/22/2024 [name] is a 89 y.o. female coming in with complaint of B knee pain. Patient states that R knee pain is worsening over past week. Pain worse with standing and movement. Feels off balance due to pain. L knee is doing ok today.        Past Medical History:  Diagnosis Date   Actinic keratosis, hx of    chest wall (2012)   Allergic rhinitis    Anxiety    Arthritis    spine, hips (01/30/2017)   Bursitis    Cellulitis 07/20/2023   Cellulitis of right foot 01/30/2017   Colon polyps    Diverticulosis    Elevated brain natriuretic peptide (BNP) level 01/23/2024   Elevated troponin 01/30/2017   Esophageal dysmotilities 10/16/2017   Fibromyalgia    qwhere (01/30/2017)   Gallstones    GERD (gastroesophageal reflux disease)    High cholesterol    History of kidney stones    History of stomach ulcers    Hx of squamous cell carcinoma excision    right mandible (2011), right arm (2014)   Hypertension    Hypertensive urgency 01/23/2024   Hyponatremia 07/20/2023   IBS (irritable bowel syndrome)    Lymphangitis 12/06/2018   Osteoarthritis    Pneumonia 2000s X 2   twice (01/30/2017)   Pyloric stenosis    Shortness of breath 01/23/2024   Stroke (HCC) 10/2014   denies residual on 01/30/2017   Past Surgical History:  Procedure Laterality Date   APPENDECTOMY     CARDIOVASCULAR STRESS TEST  04/2016   nuclear cardiolite stress test done in [place]  (no ischemia, no  LVH, no LV systolic function)   CATARACT EXTRACTION W/ INTRAOCULAR LENS  IMPLANT, BILATERAL Bilateral    CHOLECYSTECTOMY OPEN     COLONOSCOPY  2009, 2014   DIAGNOSTIC MAMMOGRAM  2009, 2012   TONSILLECTOMY     Social History   Socioeconomic History   Marital status: Widowed    Spouse name: Not on file   Number of children: 4   Years of education: Not on file   Highest education level: Not on file  Occupational History   Occupation: retired  Tobacco Use   Smoking status: Never   Smokeless tobacco: Never  Vaping Use   Vaping status: Never Used  Substance and Sexual Activity   Alcohol use: Not Currently    Comment: glass of red wine maybe once week   Drug use: No   Sexual activity: Not on file  Other Topics Concern   Not on file  Social History Narrative   Not on file   Social Drivers of Health   Financial Resource Strain: Low Risk  (05/26/2024)   Overall Financial Resource Strain (CARDIA)    Difficulty of Paying Living Expenses: Not hard at all  Food Insecurity: No Food Insecurity (05/26/2024)   Hunger Vital Sign    Worried About Running Out of Food in the Last Year: Never true  Ran Out of Food in the Last Year: Never true  Transportation Needs: No Transportation Needs (05/26/2024)   PRAPARE - Administrator, Civil Service (Medical): No    Lack of Transportation (Non-Medical): No  Physical Activity: Inactive (05/26/2024)   Exercise Vital Sign    Days of Exercise per Week: 0 days    Minutes of Exercise per Session: 0 min  Stress: No Stress Concern Present (05/26/2024)   [employer] of Occupational Health - Occupational Stress Questionnaire    Feeling of Stress: Not at all  Social Connections: Socially Isolated (05/26/2024)   Social Connection and Isolation Panel    Frequency of Communication with Friends and Family: More than three times a week    Frequency of Social Gatherings with Friends and Family: Three times a week    Attends Religious Services:  Never    Active Member of Clubs or Organizations: No    Attends Banker Meetings: Never    Marital Status: Widowed   Allergies  Allergen Reactions   Butrans [Buprenorphine] Other (See Comments)    Severe chest pains   Codeine Other (See Comments)    Severe Chest pains  Other Reaction(s): severe chest pain   Morphine And Codeine Other (See Comments)    Severe chest pains   Morphine Sulfate     Other Reaction(s): severe chest pain    Family History  Problem Relation Age of Onset   Stroke Mother    Heart attack Mother    Stroke Father    Heart attack Father    Heart attack Maternal Grandmother    Stroke Maternal Grandmother    Stroke Paternal Grandmother    Heart attack Paternal Grandmother    Heart attack Maternal Grandfather    Stroke Maternal Grandfather    Stroke Paternal Grandfather    Heart attack Paternal Grandfather    Colon cancer Neg Hx    Pancreatic cancer Neg Hx    Stomach cancer Neg Hx    Esophageal cancer Neg Hx      Current Outpatient Medications (Cardiovascular):    cholestyramine  (QUESTRAN ) 4 g packet, DISSOLVE & TAKE 1 POWDER BY MOUTH TWICE DAILY   furosemide  (LASIX ) 20 MG tablet, Take 1 tablet (20 mg total) by mouth daily.   losartan  (COZAAR ) 50 MG tablet, Take 1 tablet (50 mg total) by mouth daily.   metoprolol  succinate (TOPROL -XL) 25 MG 24 hr tablet, Take 1 tablet (25 mg total) by mouth daily.  Current Outpatient Medications (Respiratory):    fexofenadine (ALLEGRA) 180 MG tablet, Take 180 mg by mouth daily.   fluticasone  (FLONASE ) 50 MCG/ACT nasal spray, Use 1 spray(s) in each nostril twice daily  Current Outpatient Medications (Analgesics):    acetaminophen  (TYLENOL ) 650 MG CR tablet, Take 650 mg by mouth every 8 (eight) hours as needed for pain.   aspirin  EC 325 MG tablet, Take 325 mg by mouth daily.   aspirin  EC 81 MG tablet, Take 81 mg by mouth daily. Swallow whole.  Current Outpatient Medications (Hematological):    IRON PO,  Take 65 mg by mouth 3 (three) times a week.  Current Outpatient Medications (Other):    ALPRAZolam  (XANAX ) 0.5 MG tablet, TAKE 1 TABLET BY MOUTH ONCE DAILY AS NEEDED FOR ANXIETY   dicyclomine  (BENTYL ) 10 MG capsule, Take 1 capsule by mouth twice daily   diphenoxylate -atropine  (LOMOTIL ) 2.5-0.025 MG tablet, TAKE 1 TABLET BY MOUTH TWICE DAILY AS NEEDED FOR DIARRHEA OR LOOSE STOOL   DULoxetine  (CYMBALTA ) 20  MG capsule, Take 1 capsule (20 mg total) by mouth daily.   famotidine  (PEPCID ) 20 MG tablet, Take 1 tablet by mouth once daily   potassium chloride  SA (KLOR-CON  M) 20 MEQ tablet, Take 1 tablet (20 mEq total) by mouth daily.   Vitamin D , Ergocalciferol , (DRISDOL ) 1.25 MG (50000 UNIT) CAPS capsule, Take 1 capsule by mouth once a week   Reviewed prior external information including notes and imaging from  primary care provider As well as notes that were available from care everywhere and other healthcare systems.  Past medical history, social, surgical and family history all reviewed in electronic medical record.  No pertanent information unless stated regarding to the chief complaint.   Review of Systems:  No headache, visual changes, nausea, vomiting, diarrhea, constipation, dizziness, abdominal pain, skin rash, fevers, chills, night sweats, weight loss, swollen lymph nodes, body aches, joint swelling, chest pain, shortness of breath, mood changes. POSITIVE muscle aches, body aches  Objective  Blood pressure (!) 138/92, pulse 92, height 5' (1.524 m), weight 98 lb (44.5 kg), SpO2 98%.   General: No apparent distress alert and oriented x3 mood and affect normal, dressed appropriately.  HEENT: Pupils equal, extraocular movements intact  Respiratory: Patient's speak in full sentences and does not appear short of breath  Cardiovascular: No lower extremity edema, non tender, no erythema  Antalgic gait noted.  Patient ambulates with the aid of a walker.  Patient does have crepitus noted.  More  swelling noted to the patellofemoral joint on the right side.  Patient does have some instability with valgus and varus force.  After informed written and verbal consent, patient was seated on exam table. Right knee was prepped with alcohol swab and utilizing anterolateral approach, patient's right knee space was injected with 4:1  marcaine 0.5%: Kenalog  40mg /dL. Patient tolerated the procedure well without immediate complications.    Impression and Recommendations:     The above documentation has been reviewed and is accurate and complete Sam Overbeck M Alexa Blish, DO

## 2024-07-22 ENCOUNTER — Encounter: Payer: Self-pay | Admitting: Family Medicine

## 2024-07-22 ENCOUNTER — Ambulatory Visit (INDEPENDENT_AMBULATORY_CARE_PROVIDER_SITE_OTHER): Admitting: Family Medicine

## 2024-07-22 VITALS — BP 138/92 | HR 92 | Ht 60.0 in | Wt 98.0 lb

## 2024-07-22 DIAGNOSIS — M170 Bilateral primary osteoarthritis of knee: Secondary | ICD-10-CM | POA: Diagnosis not present

## 2024-07-22 NOTE — Assessment & Plan Note (Addendum)
 Chronic problem with exacerbation.  With patient's age would not be a surgical candidate.  Will try more of a palliative therapy with this.  Discussed icing regimen and home exercises, discussed which activities to do and which ones to avoid.  Follow-up again in10weeks otherwise.

## 2024-07-22 NOTE — Patient Instructions (Signed)
 Voltaren gel 2x a day Injected R knee today See me again in 10 weeks

## 2024-08-03 ENCOUNTER — Other Ambulatory Visit: Payer: Self-pay | Admitting: Nurse Practitioner

## 2024-08-03 DIAGNOSIS — K2100 Gastro-esophageal reflux disease with esophagitis, without bleeding: Secondary | ICD-10-CM

## 2024-08-10 ENCOUNTER — Other Ambulatory Visit: Payer: Self-pay | Admitting: Internal Medicine

## 2024-08-19 ENCOUNTER — Other Ambulatory Visit: Payer: Self-pay | Admitting: Internal Medicine

## 2024-08-19 ENCOUNTER — Other Ambulatory Visit: Payer: Self-pay | Admitting: Nurse Practitioner

## 2024-08-19 DIAGNOSIS — K529 Noninfective gastroenteritis and colitis, unspecified: Secondary | ICD-10-CM

## 2024-09-06 ENCOUNTER — Other Ambulatory Visit: Payer: Self-pay | Admitting: Nurse Practitioner

## 2024-09-06 DIAGNOSIS — I10 Essential (primary) hypertension: Secondary | ICD-10-CM

## 2024-09-06 DIAGNOSIS — I872 Venous insufficiency (chronic) (peripheral): Secondary | ICD-10-CM

## 2024-09-13 ENCOUNTER — Other Ambulatory Visit: Payer: Self-pay | Admitting: Nurse Practitioner

## 2024-09-13 DIAGNOSIS — I10 Essential (primary) hypertension: Secondary | ICD-10-CM

## 2024-09-22 NOTE — Progress Notes (Addendum)
 [name] [address] [place] [address] Phone: [phone] Subjective:   [name], am serving as a scribe for Dr. Arthea Claudene.  I'm seeing this patient by the request  of:  Nche, Roselie Rockford, NP  CC: Right greater than left knee pain  YEP:Dlagzrupcz  07/22/2024 Chronic problem with exacerbation.  With patient's age would not be a surgical candidate.  Will try more of a palliative therapy with this.  Discussed icing regimen and home exercises, discussed which activities to do and which ones to avoid.  Follow-up again in10weeks otherwise.     Updated 09/23/2024 [name] is a 89 y.o. female coming in with complaint of B knee pain. Patient states that her knees are painful. No pattern to her pain. Injections help for a while.        Past Medical History:  Diagnosis Date   Actinic keratosis, hx of    chest wall (2012)   Allergic rhinitis    Anxiety    Arthritis    spine, hips (01/30/2017)   Bursitis    Cellulitis 07/20/2023   Cellulitis of right foot 01/30/2017   Colon polyps    Diverticulosis    Elevated brain natriuretic peptide (BNP) level 01/23/2024   Elevated troponin 01/30/2017   Esophageal dysmotilities 10/16/2017   Fibromyalgia    qwhere (01/30/2017)   Gallstones    GERD (gastroesophageal reflux disease)    High cholesterol    History of kidney stones    History of stomach ulcers    Hx of squamous cell carcinoma excision    right mandible (2011), right arm (2014)   Hypertension    Hypertensive urgency 01/23/2024   Hyponatremia 07/20/2023   IBS (irritable bowel syndrome)    Lymphangitis 12/06/2018   Osteoarthritis    Pneumonia 2000s X 2   twice (01/30/2017)   Pyloric stenosis    Shortness of breath 01/23/2024   Stroke (HCC) 10/2014   denies residual on 01/30/2017   Past Surgical History:  Procedure Laterality Date   APPENDECTOMY     CARDIOVASCULAR STRESS TEST  04/2016   nuclear  cardiolite stress test done in [place]  (no ischemia, no LVH, no LV systolic function)   CATARACT EXTRACTION W/ INTRAOCULAR LENS  IMPLANT, BILATERAL Bilateral    CHOLECYSTECTOMY OPEN     COLONOSCOPY  2009, 2014   DIAGNOSTIC MAMMOGRAM  2009, 2012   TONSILLECTOMY     Social History   Socioeconomic History   Marital status: Widowed    Spouse name: Not on file   Number of children: 4   Years of education: Not on file   Highest education level: Not on file  Occupational History   Occupation: retired  Tobacco Use   Smoking status: Never   Smokeless tobacco: Never  Vaping Use   Vaping status: Never Used  Substance and Sexual Activity   Alcohol use: Not Currently    Comment: glass of red wine maybe once week   Drug use: No   Sexual activity: Not on file  Other Topics Concern   Not on file  Social History Narrative   Not on file   Social Drivers of Health   Financial Resource Strain: Low Risk  (05/26/2024)   Overall Financial Resource Strain (CARDIA)    Difficulty of Paying Living Expenses: Not hard at all  Food Insecurity: No Food Insecurity (05/26/2024)   Hunger Vital Sign    Worried About Running Out of Food in the Last  Year: Never true    Ran Out of Food in the Last Year: Never true  Transportation Needs: No Transportation Needs (05/26/2024)   PRAPARE - Administrator, Civil Service (Medical): No    Lack of Transportation (Non-Medical): No  Physical Activity: Inactive (05/26/2024)   Exercise Vital Sign    Days of Exercise per Week: 0 days    Minutes of Exercise per Session: 0 min  Stress: No Stress Concern Present (05/26/2024)   [employer] of Occupational Health - Occupational Stress Questionnaire    Feeling of Stress: Not at all  Social Connections: Socially Isolated (05/26/2024)   Social Connection and Isolation Panel    Frequency of Communication with Friends and Family: More than three times a week    Frequency of Social Gatherings with Friends and  Family: Three times a week    Attends Religious Services: Never    Active Member of Clubs or Organizations: No    Attends Banker Meetings: Never    Marital Status: Widowed   Allergies  Allergen Reactions   Butrans [Buprenorphine] Other (See Comments)    Severe chest pains   Codeine Other (See Comments)    Severe Chest pains  Other Reaction(s): severe chest pain   Morphine And Codeine Other (See Comments)    Severe chest pains   Morphine Sulfate     Other Reaction(s): severe chest pain    Family History  Problem Relation Age of Onset   Stroke Mother    Heart attack Mother    Stroke Father    Heart attack Father    Heart attack Maternal Grandmother    Stroke Maternal Grandmother    Stroke Paternal Grandmother    Heart attack Paternal Grandmother    Heart attack Maternal Grandfather    Stroke Maternal Grandfather    Stroke Paternal Grandfather    Heart attack Paternal Grandfather    Colon cancer Neg Hx    Pancreatic cancer Neg Hx    Stomach cancer Neg Hx    Esophageal cancer Neg Hx      Current Outpatient Medications (Cardiovascular):    cholestyramine  (QUESTRAN ) 4 g packet, DISSOLVE & TAKE 1 POWDER BY MOUTH TWICE DAILY   furosemide  (LASIX ) 20 MG tablet, Take 1 tablet by mouth once daily   losartan  (COZAAR ) 50 MG tablet, Take 1 tablet (50 mg total) by mouth daily.   metoprolol  succinate (TOPROL -XL) 25 MG 24 hr tablet, Take 1 tablet by mouth once daily  Current Outpatient Medications (Respiratory):    fexofenadine (ALLEGRA) 180 MG tablet, Take 180 mg by mouth daily.   fluticasone  (FLONASE ) 50 MCG/ACT nasal spray, Use 1 spray(s) in each nostril twice daily  Current Outpatient Medications (Analgesics):    acetaminophen  (TYLENOL ) 650 MG CR tablet, Take 650 mg by mouth every 8 (eight) hours as needed for pain.   aspirin  EC 325 MG tablet, Take 325 mg by mouth daily.   aspirin  EC 81 MG tablet, Take 81 mg by mouth daily. Swallow whole.  Current Outpatient  Medications (Hematological):    IRON PO, Take 65 mg by mouth 3 (three) times a week.  Current Outpatient Medications (Other):    ALPRAZolam  (XANAX ) 0.5 MG tablet, TAKE 1 TABLET BY MOUTH ONCE DAILY AS NEEDED FOR ANXIETY   dicyclomine  (BENTYL ) 10 MG capsule, Take 1 capsule by mouth twice daily   diphenoxylate -atropine  (LOMOTIL ) 2.5-0.025 MG tablet, TAKE 1 TABLET BY MOUTH TWICE DAILY FOR DIARRHEA OR LOOSE STOOL   DULoxetine  (CYMBALTA ) 20  MG capsule, Take 1 capsule (20 mg total) by mouth daily.   famotidine  (PEPCID ) 20 MG tablet, Take 1 tablet by mouth once daily   potassium chloride  SA (KLOR-CON  M) 20 MEQ tablet, Take 1 tablet (20 mEq total) by mouth daily.   Vitamin D , Ergocalciferol , (DRISDOL ) 1.25 MG (50000 UNIT) CAPS capsule, Take 1 capsule by mouth once a week   Reviewed prior external information including notes and imaging from  primary care provider As well as notes that were available from care everywhere and other healthcare systems.  Past medical history, social, surgical and family history all reviewed in electronic medical record.  No pertanent information unless stated regarding to the chief complaint.   Review of Systems:  No headache, visual changes, nausea, vomiting, diarrhea, constipation, dizziness, abdominal pain, skin rash, fevers, chills, night sweats, weight loss, swollen lymph nodes, body aches, joint swelling, chest pain, shortness of breath, mood changes. POSITIVE muscle aches  Objective  Blood pressure 132/86, pulse 70, height 5' (1.524 m), weight 99 lb (44.9 kg), SpO2 95%.   General: No apparent distress alert and oriented x3 mood and affect normal, dressed appropriately.  HEENT: Pupils equal, extraocular movements intact  Respiratory: Patient's speak in full sentences and does not appear short of breath  Cardiovascular: Trace edema noted Severe arthritic changes noted of the knees right greater than left.  Severe tenderness to palpation over the medial joint  space.  Instability noted with valgus and varus force.  After informed written and verbal consent, patient was seated on exam table. Right knee was prepped with alcohol swab and utilizing anterolateral approach, patient's right knee space was injected with 4:1  marcaine 0.5%: depomedrol 40mg /dL. Patient tolerated the procedure well without immediate complications.     Impression and Recommendations:    The above documentation has been reviewed and is accurate and complete Simisola Sandles M Antaniya Venuti, DO

## 2024-09-23 ENCOUNTER — Ambulatory Visit: Admitting: Family Medicine

## 2024-09-23 ENCOUNTER — Encounter: Payer: Self-pay | Admitting: Family Medicine

## 2024-09-23 VITALS — BP 132/86 | HR 70 | Ht 60.0 in | Wt 99.0 lb

## 2024-09-23 DIAGNOSIS — M1711 Unilateral primary osteoarthritis, right knee: Secondary | ICD-10-CM | POA: Diagnosis not present

## 2024-09-23 DIAGNOSIS — M170 Bilateral primary osteoarthritis of knee: Secondary | ICD-10-CM

## 2024-09-23 NOTE — Assessment & Plan Note (Addendum)
 Right sided injected again today.  Discussed with patient about icing regimen and home exercises.  Do feel that we will get hopefully 2 to 3 months out of this.  Have attempted viscosupplementation with very minimal benefits in the past.  Increase activity slowly.  Discussed icing regimen.  Follow-up again in 6 to 8 weeks otherwise.  Instability noted with valgus and varus force.  Will attempt a hinged brace on the individual.

## 2024-09-23 NOTE — Patient Instructions (Signed)
 Injected R knee today See me in 2 months

## 2024-09-29 ENCOUNTER — Telehealth: Payer: Self-pay | Admitting: Family Medicine

## 2024-09-29 NOTE — Telephone Encounter (Signed)
 Patient called and stated that she was here and picked up a brace for her leg and she has decided that she does not feel comfortable with that and wnts to return it and she wants to know how to return it. Please advise.

## 2024-09-30 NOTE — Telephone Encounter (Signed)
 Left patient message with return instructions including phone number [phone].

## 2024-10-01 ENCOUNTER — Ambulatory Visit: Payer: Self-pay

## 2024-10-01 NOTE — Telephone Encounter (Signed)
 FYI Only or Action Required?: Action required by provider: clinical question for provider and update on patient condition.  Patient was last seen in primary care on 04/04/2024 by Nche, Roselie Rockford, NP.  Called Nurse Triage reporting Altered Mental Status.  Symptoms began yesterday. Patient is Alert and Oriented now.  Interventions attempted: Rest, hydration, or home remedies.  Symptoms are: completely resolved.  Triage Disposition: See HCP Within 4 Hours (Or PCP Triage)  Patient/caregiver understands and will follow disposition?: No, wishes to speak with PCP  Daughter called to report an episode of confusion last night. Patient who currently lives in independent living, attempted to leave the facility twice last night. Daughter reports patient was hallucinating and very confused. Daughter states this is the second time this has happened in two weeks.  Patient is currently alert and oriented and told daughter she had started to hallucinate yesterday. Daughter is concerned that this happened again. Denies CP or SOB. Daughter endorses no urinary symptoms that she knows of. Patient is scheduled for an office visit on 10/08/2024. Daughter is wondering if patient needs to be seen sooner. No availability in the office today. Would like a call back at [phone].  Copied from CRM [id number]. Topic: Clinical - Medical Advice >> Oct 01, 2024  8:56 AM Viola FALCON wrote: Patient daughter Barnie called regarding patient having delirium episiode lastnight and tried leaving nursing home twice - wants to know what is triggering these episiodes and wants to know what to do. Please call her at [phone] [facility]) patient has appt 10/08/24 Reason for Disposition  [1] Acting confused (e.g., disoriented, slurred speech) AND [2] brief (now gone)  Answer Assessment - Initial Assessment Questions 1. LEVEL OF CONSCIOUSNESS: How are they (the patient) acting right now? (e.g., alert-oriented, confused, lethargic,  stuporous, comatose)     Currently alert and oriented per daughter.  2. ONSET: When did the confusion start?  (e.g., minutes, hours, days)     Started two weeks ago-episode of confusion and delirium. Daughter states after a day patient improved. Daughter reports another episode of delirium and confusion yesterday-attempted to leave the nursing home twice last night. Patient is lucid currently-told daughter that she remembers the hallucinations but knows they aren't real.  3. PATTERN: Does this come and go, or has it been constant since it started?  Is it present now?     Not present currently.  4. ALCOHOL or DRUGS: Have they been drinking alcohol or taking any drugs?      no 5. NARCOTIC MEDICINES: Have they been receiving any narcotic medications? (e.g., morphine, Vicodin)     no 6. CAUSE: What do you think is causing the confusion?      unsure 7. OTHER SYMPTOMS: Are there any other symptoms? (e.g., difficulty breathing, fever, headache, weakness)     no  Protocols used: Confusion - Delirium-A-AH

## 2024-10-06 ENCOUNTER — Encounter: Payer: Self-pay | Admitting: Nurse Practitioner

## 2024-10-06 ENCOUNTER — Ambulatory Visit

## 2024-10-06 ENCOUNTER — Ambulatory Visit: Payer: Self-pay | Admitting: Nurse Practitioner

## 2024-10-06 ENCOUNTER — Ambulatory Visit: Admitting: Nurse Practitioner

## 2024-10-06 VITALS — BP 128/76 | HR 85 | Temp 98.0°F | Wt 98.4 lb

## 2024-10-06 DIAGNOSIS — R404 Transient alteration of awareness: Secondary | ICD-10-CM | POA: Diagnosis not present

## 2024-10-06 DIAGNOSIS — I10 Essential (primary) hypertension: Secondary | ICD-10-CM

## 2024-10-06 DIAGNOSIS — R410 Disorientation, unspecified: Secondary | ICD-10-CM | POA: Diagnosis not present

## 2024-10-06 DIAGNOSIS — I872 Venous insufficiency (chronic) (peripheral): Secondary | ICD-10-CM

## 2024-10-06 DIAGNOSIS — R4182 Altered mental status, unspecified: Secondary | ICD-10-CM | POA: Diagnosis not present

## 2024-10-06 DIAGNOSIS — I3481 Nonrheumatic mitral (valve) annulus calcification: Secondary | ICD-10-CM | POA: Diagnosis not present

## 2024-10-06 LAB — COMPREHENSIVE METABOLIC PANEL WITH GFR
ALT: 14 U/L (ref 0–35)
AST: 20 U/L (ref 0–37)
Albumin: 3.7 g/dL (ref 3.5–5.2)
Alkaline Phosphatase: 63 U/L (ref 39–117)
BUN: 12 mg/dL (ref 6–23)
CO2: 26 meq/L (ref 19–32)
Calcium: 9.2 mg/dL (ref 8.4–10.5)
Chloride: 105 meq/L (ref 96–112)
Creatinine, Ser: 0.56 mg/dL (ref 0.40–1.20)
GFR: 76.26 mL/min (ref >60.00)
Glucose, Bld: 92 mg/dL (ref 70–99)
Potassium: 3.7 meq/L (ref 3.5–5.1)
Sodium: 142 meq/L (ref 135–145)
Total Bilirubin: 0.4 mg/dL (ref 0.2–1.2)
Total Protein: 6.9 g/dL (ref 6.0–8.3)

## 2024-10-06 LAB — POC URINALSYSI DIPSTICK (AUTOMATED)
Bilirubin, UA: NEGATIVE
Blood, UA: NEGATIVE
Glucose, UA: NEGATIVE
Ketones, UA: NEGATIVE
Leukocytes, UA: NEGATIVE
Nitrite, UA: NEGATIVE
Protein, UA: NEGATIVE
Spec Grav, UA: 1.02 (ref 1.010–1.025)
Urobilinogen, UA: NEGATIVE U/dL — AB
pH, UA: 6 (ref 5.0–8.0)

## 2024-10-06 LAB — CBC WITH DIFFERENTIAL/PLATELET
Basophils Absolute: 0.1 K/uL (ref 0.0–0.1)
Basophils Relative: 0.9 % (ref 0.0–3.0)
Eosinophils Absolute: 0.1 K/uL (ref 0.0–0.7)
Eosinophils Relative: 1.5 % (ref 0.0–5.0)
HCT: 38.7 % (ref 36.0–46.0)
Hemoglobin: 12.7 g/dL (ref 12.0–15.0)
Lymphocytes Relative: 17.8 % (ref 12.0–46.0)
Lymphs Abs: 1.3 K/uL (ref 0.7–4.0)
MCHC: 32.9 g/dL (ref 30.0–36.0)
MCV: 94.5 fl (ref 78.0–100.0)
Monocytes Absolute: 0.6 K/uL (ref 0.1–1.0)
Monocytes Relative: 7.6 % (ref 3.0–12.0)
Neutro Abs: 5.4 K/uL (ref 1.4–7.7)
Neutrophils Relative %: 72.2 % (ref 43.0–77.0)
Platelets: 276 K/uL (ref 150.0–400.0)
RBC: 4.09 Mil/uL (ref 3.87–5.11)
RDW: 13.2 % (ref 11.5–15.5)
WBC: 7.5 K/uL (ref 4.0–10.5)

## 2024-10-06 LAB — TSH: TSH: 2.38 u[IU]/mL (ref 0.35–5.50)

## 2024-10-06 NOTE — Assessment & Plan Note (Signed)
 No LE edema or ulcer noted today Use of furosemide  and compression stocking prn

## 2024-10-06 NOTE — Assessment & Plan Note (Signed)
 Home BP readings: 120s-110s/70s-60s, HR 55-70 Compliant with metoprolol , furosemide  and losartan  BP was elevated on 09/14/2024, but daughter notes [name] had skipped her med dose. BP Readings from Last 3 Encounters:  10/06/24 128/76  09/23/24 132/86  07/22/24 (!) 138/92    Maintain med doses. Check CMP today F/up in 1-34month

## 2024-10-06 NOTE — Progress Notes (Signed)
 Established Patient Visit  Patient: Susan [name]   DOB: 10/11/1924   89 y.o. Female  MRN: [id number] Visit Date: 10/06/2024  Subjective:    Chief Complaint  Patient presents with   Confusion    Started a few days ago, could say/do what she wanted to do last Tuesday evening seeing people in apartment not knowing who there are attempted to leave facility    HPI Venous insufficiency of both lower extremities No LE edema or ulcer noted today Use of furosemide  and compression stocking prn  Essential hypertension Home BP readings: 120s-110s/70s-60s, HR 55-70 Compliant with metoprolol , furosemide  and losartan  BP was elevated on 09/14/2024, but daughter notes Mrs. Barrell had skipped her med dose. BP Readings from Last 3 Encounters:  10/06/24 128/76  09/23/24 132/86  07/22/24 (!) 138/92    Maintain med doses. Check CMP today F/up in 1-45month  Transient alteration of awareness Accompanied by daughter-[name] today [name] reports 2 incidents of hallucination self reported by Ms. Namba. On 09/14/2024, Ms. Raevin called [name] and stated there were people walking around her apartment the day before. She believed they were other residents in the building. When [name] arrived at the apartment, she found no one, but Ms. Shaniya insisted there were people present. Ms. Derrisha was not in distress. [name] did not notice any physical or speech abnormality. [name] found that Ms. Lendy had not eaten or drank anything in the last 24hrs. She had not taken her meds either. BP was elevated 170/83. HR 88.[name] spent the night with her and ensured to ate, drank fluids, and took meds. BP improved to 133/77 after 1hour. On 10/01/2024, [name] was contacted by [name] staff, stating Ms. Canaan was requesting for a taxi to leave. Upon return to her apartment, Ms. Kensly informed [name] that there were people in her apartment again. [name] states earlier that same day, she had complained of  lower back pain. BP was 136/81 HR 72. Today, Ms. Chardae states she remembers above incidents. She stated at the time, it seems very real to her. [name] states Ms. Jullisa started taking Prevagen OVER THE COUNTER supplement for the last 90days. She has noticed an improved in speech and memory with this supplement. Ms. Kailynn denies any pain or malaise today  POC UA: normal, sent urine for culture CXR: normal Obtain CT head-stat, cbc, Tsh, and CMP Advised [name] to reconsider Ms. Naryiah's level of care: either relocating to assisted living or getting pca to sit for her during the day. [name] and Ms. Larah stated they will discuss with the rest of the family. F/up in 9month  Wt Readings from Last 3 Encounters:  10/06/24 98 lb 6.4 oz (44.6 kg)  09/23/24 99 lb (44.9 kg)  07/22/24 98 lb (44.5 kg)        10/06/2024   10:39 AM 01/07/2024    2:09 PM 01/08/2023    2:20 PM  MMSE - Mini Mental State Exam  Orientation to time 4 5 5   Orientation to Place 4 5 5   Registration 2 3 3   Attention/ Calculation 5 5 5   Recall 2 2 1   Language- name 2 objects 2 2 2   Language- repeat 1 1 1   Language- follow 3 step command 3 3 3   Language- read & follow direction 1 1 1   Write a sentence 1 1 1   Copy design 1 1 1   Total score 26 29 28  Reviewed medical, surgical, and social history today  Medications: Outpatient Medications Prior to Visit  Medication Sig   acetaminophen  (TYLENOL ) 650 MG CR tablet Take 650 mg by mouth every 8 (eight) hours as needed for pain.   ALPRAZolam  (XANAX ) 0.5 MG tablet TAKE 1 TABLET BY MOUTH ONCE DAILY AS NEEDED FOR ANXIETY   aspirin  EC 81 MG tablet Take 81 mg by mouth daily. Swallow whole.   cholestyramine  (QUESTRAN ) 4 g packet DISSOLVE & TAKE 1 POWDER BY MOUTH TWICE DAILY   dicyclomine  (BENTYL ) 10 MG capsule Take 1 capsule by mouth twice daily   diphenoxylate -atropine  (LOMOTIL ) 2.5-0.025 MG tablet TAKE 1 TABLET BY MOUTH TWICE DAILY FOR DIARRHEA OR LOOSE STOOL   DULoxetine   (CYMBALTA ) 20 MG capsule Take 1 capsule (20 mg total) by mouth daily.   famotidine  (PEPCID ) 20 MG tablet Take 1 tablet by mouth once daily   fexofenadine (ALLEGRA) 180 MG tablet Take 180 mg by mouth daily.   fluticasone  (FLONASE ) 50 MCG/ACT nasal spray Use 1 spray(s) in each nostril twice daily   furosemide  (LASIX ) 20 MG tablet Take 1 tablet by mouth once daily   IRON PO Take 65 mg by mouth 3 (three) times a week.   losartan  (COZAAR ) 50 MG tablet Take 1 tablet (50 mg total) by mouth daily.   metoprolol  succinate (TOPROL -XL) 25 MG 24 hr tablet Take 1 tablet by mouth once daily   potassium chloride  SA (KLOR-CON  M) 20 MEQ tablet Take 1 tablet (20 mEq total) by mouth daily.   Vitamin D , Ergocalciferol , (DRISDOL ) 1.25 MG (50000 UNIT) CAPS capsule Take 1 capsule by mouth once a week   [DISCONTINUED] aspirin  EC 325 MG tablet Take 325 mg by mouth daily.   No facility-administered medications prior to visit.   Reviewed past medical and social history.   ROS per HPI above      Objective:  BP 128/76 (BP Location: Right Arm, Patient Position: Sitting, Cuff Size: Normal)   Pulse 85   Temp 98 F (36.7 C) (Temporal)   Wt 98 lb 6.4 oz (44.6 kg)   SpO2 96%   BMI 19.22 kg/m      Physical Exam Vitals and nursing note reviewed.  Constitutional:      General: She is not in acute distress. Cardiovascular:     Rate and Rhythm: Normal rate and regular rhythm.     Pulses: Normal pulses.     Heart sounds: Normal heart sounds.  Pulmonary:     Effort: Pulmonary effort is normal.     Breath sounds: Normal breath sounds.  Abdominal:     General: There is no distension.     Tenderness: There is no abdominal tenderness.  Musculoskeletal:     Right lower leg: No edema.     Left lower leg: No edema.  Skin:    Findings: No erythema or rash.  Neurological:     Mental Status: She is alert and oriented to person, place, and time.     Results for orders placed or performed in visit on 10/06/24  POCT  Urinalysis Dipstick (Automated)  Result Value Ref Range   Color, UA Yellow    Clarity, UA Clear    Glucose, UA Negative Negative   Bilirubin, UA Negative    Ketones, UA Negative    Spec Grav, UA 1.020 1.010 - 1.025   Blood, UA Negative    pH, UA 6.0 5.0 - 8.0   Protein, UA Negative Negative   Urobilinogen, UA negative (A) 0.2  or 1.0 E.U./dL   Nitrite, UA Negative    Leukocytes, UA Negative Negative      Assessment & Plan:    Problem List Items Addressed This Visit     Essential hypertension   Home BP readings: 120s-110s/70s-60s, HR 55-70 Compliant with metoprolol , furosemide  and losartan  BP was elevated on 09/14/2024, but daughter notes Mrs. Fanton had skipped her med dose. BP Readings from Last 3 Encounters:  10/06/24 128/76  09/23/24 132/86  07/22/24 (!) 138/92    Maintain med doses. Check CMP today F/up in 1-69month      Transient alteration of awareness - Primary   Accompanied by daughter-[name] today [name] reports 2 incidents of hallucination self reported by Ms. Folger. On 09/14/2024, Ms. Alinah called [name] and stated there were people walking around her apartment the day before. She believed they were other residents in the building. When [name] arrived at the apartment, she found no one, but Ms. Melodi insisted there were people present. Ms. Irmalee was not in distress. [name] did not notice any physical or speech abnormality. [name] found that Ms. Sheera had not eaten or drank anything in the last 24hrs. She had not taken her meds either. BP was elevated 170/83. HR 88.[name] spent the night with her and ensured to ate, drank fluids, and took meds. BP improved to 133/77 after 1hour. On 10/01/2024, [name] was contacted by [name] staff, stating Ms. Betheny was requesting for a taxi to leave. Upon return to her apartment, Ms. Naysa informed [name] that there were people in her apartment again. [name] states earlier that same day, she had complained of lower back pain.  BP was 136/81 HR 72. Today, Ms. Adaria states she remembers above incidents. She stated at the time, it seems very real to her. [name] states Ms. Shaquille started taking Prevagen OVER THE COUNTER supplement for the last 90days. She has noticed an improved in speech and memory with this supplement. Ms. Shavon denies any pain or malaise today  POC UA: normal, sent urine for culture CXR: normal Obtain CT head-stat, cbc, Tsh, and CMP Advised [name] to reconsider Ms. Geni's level of care: either relocating to assisted living or getting pca to sit for her during the day. [name] and Ms. Trulee stated they will discuss with the rest of the family. F/up in 56month      Relevant Orders   POCT Urinalysis Dipstick (Automated) (Completed)   CBC with Differential/Platelet   Comprehensive metabolic panel with GFR   TSH   Urine Culture   CT HEAD WO CONTRAST ( )   DG Chest 2 View (Completed)   Venous insufficiency of both lower extremities   No LE edema or ulcer noted today Use of furosemide  and compression stocking prn      Return in about 4 weeks (around 11/03/2024) for triansient altered mental status.     Roselie Mood, NP

## 2024-10-06 NOTE — Assessment & Plan Note (Addendum)
 Accompanied by daughter-Susan [name] today Susan [name] reports 2 incidents of hallucination self reported by Susan [name]. On 09/14/2024, Susan [name] called [name] and stated there were people walking around her apartment the day before. She believed they were other residents in the building. When Susan [name] arrived at the apartment, she found no one, but Susan [name] insisted there were people present. Susan [name] was not in distress. Susan [name] did not notice any physical or speech abnormality. Susan [name] found that Susan [name] had not eaten or drank anything in the last 24hrs. She had not taken her meds either. BP was elevated 170/83. HR 88.Susan [name] spent the night with her and ensured to ate, drank fluids, and took meds. BP improved to 133/77 after 1hour. On 10/01/2024, Susan [name] was contacted by [name] staff, stating Susan [name] was requesting for a taxi to leave. Upon return to her apartment, Susan [name] informed [name] that there were people in her apartment again. [name] states earlier that same day, she had complained of lower back pain. BP was 136/81 HR 72. Today, Susan [name] states she remembers above incidents. She stated at the time, it seems very real to her. Susan [name] states Susan [name] started taking Prevagen OVER THE COUNTER supplement for the last 90days. She has noticed an improved in speech and memory with this supplement. Susan [name] denies any pain or malaise today  POC UA: normal, sent urine for culture CXR: normal Obtain CT head-stat, cbc, Tsh, and CMP Advised to stop prevagen supplement Advised Susan [name] to reconsider Susan [name]'s level of care: either relocating to assisted living or getting pca to sit for her during the day. Susan [name] and Susan [name] stated they will discuss with the rest of the family. F/up in 59month

## 2024-10-06 NOTE — Patient Instructions (Signed)
 Stop aspirin  325mg  and prevagen Go to lab for blood draw and CXR You will be contacted to schedule appointment for CT head

## 2024-10-06 NOTE — Assessment & Plan Note (Signed)
>>  ASSESSMENT AND PLAN FOR TRANSIENT ALTERATION OF AWARENESS WRITTEN ON 10/06/2024  3:08 PM BY Puneet Masoner LUM, NP  Accompanied by daughter-Susan [name] today Susan [name] reports 2 incidents of hallucination self reported by [name]. On 09/14/2024, [name] called Susan [name] and stated there were people walking around her apartment the day before. She believed they were other residents in the building. When Susan [name] arrived at the apartment, she found no one, but [name] insisted there were people present. [name] was not in distress. Susan [name] did not notice any physical or speech abnormality. Susan [name] found that [name] had not eaten or drank anything in the last 24hrs. She had not taken her meds either. BP was elevated 170/83. HR 88.Susan [name] spent the night with her and ensured to ate, drank fluids, and took meds. BP improved to 133/77 after 1hour. On 10/01/2024, Susan [name] was contacted by [name] staff, stating [name] was requesting for a taxi to leave. Upon return to her apartment, [name] informed Susan [name] that there were people in her apartment again. Susan [name] states earlier that same day, she had complained of lower back pain. BP was 136/81 HR 72. Today, [name] states she remembers above incidents. She stated at the time, it seems very real to her. Susan [name] states [name] started taking Prevagen OVER THE COUNTER supplement for the last 90days. She has noticed an improved in speech and memory with this supplement. [name] denies any pain or malaise today  POC UA: normal, sent urine for culture CXR: normal Obtain CT head-stat, cbc, Tsh, and CMP Advised to stop prevagen supplement Advised Susan [name] to reconsider [name]'s level of care: either relocating to assisted living or getting pca to sit for her during the day. Susan [name] and [name] stated they will discuss with the rest of the family. F/up in 22month

## 2024-10-07 LAB — URINE CULTURE
MICRO NUMBER:: 17182109
Result:: NO GROWTH
SPECIMEN QUALITY:: ADEQUATE

## 2024-10-08 ENCOUNTER — Ambulatory Visit: Admitting: Nurse Practitioner

## 2024-10-10 ENCOUNTER — Encounter: Payer: Self-pay | Admitting: Nurse Practitioner

## 2024-10-12 ENCOUNTER — Ambulatory Visit (HOSPITAL_BASED_OUTPATIENT_CLINIC_OR_DEPARTMENT_OTHER)
Admission: RE | Admit: 2024-10-12 | Discharge: 2024-10-12 | Disposition: A | Discharge status: Home / Self Care | Source: Ambulatory Visit | Attending: Nurse Practitioner | Admitting: Nurse Practitioner

## 2024-10-12 DIAGNOSIS — G459 Transient cerebral ischemic attack, unspecified: Secondary | ICD-10-CM | POA: Diagnosis not present

## 2024-10-12 DIAGNOSIS — R404 Transient alteration of awareness: Secondary | ICD-10-CM | POA: Diagnosis not present

## 2024-10-16 ENCOUNTER — Encounter: Payer: Self-pay | Admitting: Nurse Practitioner

## 2024-10-16 DIAGNOSIS — R404 Transient alteration of awareness: Secondary | ICD-10-CM

## 2024-10-17 ENCOUNTER — Encounter: Payer: Self-pay | Admitting: Physician Assistant

## 2024-10-20 ENCOUNTER — Other Ambulatory Visit: Payer: Self-pay

## 2024-10-20 DIAGNOSIS — I10 Essential (primary) hypertension: Secondary | ICD-10-CM | POA: Diagnosis not present

## 2024-10-20 DIAGNOSIS — Z79899 Other long term (current) drug therapy: Secondary | ICD-10-CM | POA: Diagnosis not present

## 2024-10-20 LAB — CBC
HCT: 37.9 % (ref 36.0–46.0)
Hemoglobin: 12.4 g/dL (ref 12.0–15.0)
MCH: 31.1 pg (ref 26.0–34.0)
MCHC: 32.7 g/dL (ref 30.0–36.0)
MCV: 95 fL (ref 80.0–100.0)
Platelets: 262 K/uL (ref 150–400)
RBC: 3.99 MIL/uL (ref 3.87–5.11)
RDW: 12.6 % (ref 11.5–15.5)
WBC: 5 K/uL (ref 4.0–10.5)
nRBC: 0 % (ref 0.0–0.2)

## 2024-10-20 LAB — BASIC METABOLIC PANEL WITH GFR
Anion gap: 10 (ref 5–15)
BUN: 10 mg/dL (ref 8–23)
CO2: 27 mmol/L (ref 22–32)
Calcium: 9.4 mg/dL (ref 8.9–10.3)
Chloride: 103 mmol/L (ref 98–111)
Creatinine, Ser: 0.54 mg/dL (ref 0.44–1.00)
GFR, Estimated: >60 mL/min (ref >60)
Glucose, Bld: 138 mg/dL — ABNORMAL HIGH (ref 70–99)
Potassium: 3.8 mmol/L (ref 3.5–5.1)
Sodium: 140 mmol/L (ref 135–145)

## 2024-10-20 LAB — URINALYSIS, ROUTINE W REFLEX MICROSCOPIC
Bilirubin Urine: NEGATIVE
Glucose, UA: NEGATIVE mg/dL
Hgb urine dipstick: NEGATIVE
Ketones, ur: NEGATIVE mg/dL
Leukocytes,Ua: NEGATIVE
Nitrite: NEGATIVE
Protein, ur: NEGATIVE mg/dL
Specific Gravity, Urine: <1.005 — ABNORMAL LOW (ref 1.005–1.030)
pH: 6 (ref 5.0–8.0)

## 2024-10-20 NOTE — ED Triage Notes (Addendum)
 BP taken twice 1 hr PTA: 173/92, 173/89. Medicated for HTN. Family concerned for dehydration with confusion in the past. Chronically poor appetite for food and water. Family reports moments of confusion tend to be at night before bed.

## 2024-10-21 ENCOUNTER — Emergency Department (HOSPITAL_BASED_OUTPATIENT_CLINIC_OR_DEPARTMENT_OTHER)
Admission: EM | Admit: 2024-10-21 | Discharge: 2024-10-21 | Disposition: A | Discharge status: Home / Self Care | Attending: Emergency Medicine | Admitting: Emergency Medicine

## 2024-10-21 DIAGNOSIS — I10 Essential (primary) hypertension: Secondary | ICD-10-CM

## 2024-10-21 NOTE — ED Provider Notes (Signed)
 [facility] AT [facility]  Provider Note  CSN: [id number] Arrival date & time: 10/20/24 2159  History Chief Complaint  Patient presents with   Hypertension    Susan [name] is a 89 y.o. female here with daughter for evaluation of elevated BP readings at home earlier tonight. She still lives in independent living facility but daughter reports BP tonight was 170/90 range. No other symptoms although she had had some intermittent episodes of agitation and confusion. Worked up by PCP, recent CT head was neg and awaiting referral to Neurology.    Home Medications Prior to Admission medications   Medication Sig Start Date End Date Taking? Authorizing Provider  acetaminophen  (TYLENOL ) 650 MG CR tablet Take 650 mg by mouth every 8 (eight) hours as needed for pain.    [provider]  ALPRAZolam  (XANAX ) 0.5 MG tablet TAKE 1 TABLET BY MOUTH ONCE DAILY AS NEEDED FOR ANXIETY 04/16/24   Nche, Roselie Rockford, NP  aspirin  EC 81 MG tablet Take 81 mg by mouth daily. Swallow whole.    [provider]  cholestyramine  (QUESTRAN ) 4 g packet DISSOLVE & TAKE 1 POWDER BY MOUTH TWICE DAILY 08/11/24   Abran Norleen SAILOR, MD  dicyclomine  (BENTYL ) 10 MG capsule Take 1 capsule by mouth twice daily 08/20/24   Nche, Charlotte Lum, NP  diphenoxylate -atropine  (LOMOTIL ) 2.5-0.025 MG tablet TAKE 1 TABLET BY MOUTH TWICE DAILY FOR DIARRHEA OR LOOSE STOOL 08/21/24   Abran Norleen SAILOR, MD  DULoxetine  (CYMBALTA ) 20 MG capsule Take 1 capsule (20 mg total) by mouth daily. 02/08/24   Smith, Zachary M, DO  famotidine  (PEPCID ) 20 MG tablet Take 1 tablet by mouth once daily 08/06/24   Nche, Roselie Rockford, NP  fexofenadine (ALLEGRA) 180 MG tablet Take 180 mg by mouth daily.    [provider]  fluticasone  (FLONASE ) 50 MCG/ACT nasal spray Use 1 spray(s) in each nostril twice daily 01/02/23   Nche, Charlotte Lum, NP  furosemide  (LASIX ) 20 MG tablet Take 1 tablet by mouth once daily 09/08/24    Nche, Roselie Rockford, NP  IRON PO Take 65 mg by mouth 3 (three) times a week.    [provider]  losartan  (COZAAR ) 50 MG tablet Take 1 tablet (50 mg total) by mouth daily. 01/07/24   Nche, Roselie Rockford, NP  metoprolol  succinate (TOPROL -XL) 25 MG 24 hr tablet Take 1 tablet by mouth once daily 09/15/24   Nche, Charlotte Lum, NP  potassium chloride  SA (KLOR-CON  M) 20 MEQ tablet Take 1 tablet (20 mEq total) by mouth daily. 01/23/24   Beola Terrall RAMAN, PA-C  Vitamin D , Ergocalciferol , (DRISDOL ) 1.25 MG (50000 UNIT) CAPS capsule Take 1 capsule by mouth once a week 07/09/24   Smith, Zachary M, DO     Allergies    Butrans [buprenorphine], Codeine, Morphine and codeine, and Morphine sulfate   Review of Systems   Review of Systems Please see HPI for pertinent positives and negatives  Physical Exam BP (!) 156/79 (BP Location: Right Arm)   Pulse (!) 57   Temp 98.1 F (36.7 C)   Resp 19   SpO2 96%   Physical Exam Vitals and nursing note reviewed.  HENT:     Head: Normocephalic.     Nose: Nose normal.  Eyes:     Extraocular Movements: Extraocular movements intact.  Pulmonary:     Effort: Pulmonary effort is normal.  Musculoskeletal:        General: Normal range of motion.  Cervical back: Neck supple.  Skin:    Findings: No rash (on exposed skin).  Neurological:     Mental Status: She is alert and oriented to person, place, and time.  Psychiatric:        Mood and Affect: Mood normal.     ED Results / Procedures / Treatments   EKG None  Procedures Procedures  Medications Ordered in the ED Medications - No data to display  Initial Impression and Plan  Patient here with asymptomatic HTN, BP here is improving without intervention. Labs done in triage show unremarkable CBC, BMP and UA. Recommend she continue with home monitoring. Take medications as prescribed. PCP follow up, RTED for any other concerns.    ED Course       MDM Rules/Calculators/A&P Medical  Decision Making Problems Addressed: Asymptomatic hypertension: chronic illness or injury with exacerbation, progression, or side effects of treatment  Amount and/or Complexity of Data Reviewed Labs: ordered. Decision-making details documented in ED Course.  Risk Prescription drug management.     Final Clinical Impression(s) / ED Diagnoses Final diagnoses:  Asymptomatic hypertension    Rx / DC Orders ED Discharge Orders     None        Roselyn Carlin NOVAK, MD 10/21/24 [phone]

## 2024-11-03 ENCOUNTER — Encounter: Payer: Self-pay | Admitting: Nurse Practitioner

## 2024-11-03 ENCOUNTER — Ambulatory Visit: Admitting: Nurse Practitioner

## 2024-11-03 VITALS — BP 124/70 | HR 62 | Temp 98.1°F | Ht 60.0 in | Wt 101.4 lb

## 2024-11-03 DIAGNOSIS — G3184 Mild cognitive impairment, so stated: Secondary | ICD-10-CM | POA: Diagnosis not present

## 2024-11-03 DIAGNOSIS — E559 Vitamin D deficiency, unspecified: Secondary | ICD-10-CM | POA: Insufficient documentation

## 2024-11-03 DIAGNOSIS — Z23 Encounter for immunization: Secondary | ICD-10-CM | POA: Diagnosis not present

## 2024-11-03 DIAGNOSIS — I350 Nonrheumatic aortic (valve) stenosis: Secondary | ICD-10-CM | POA: Insufficient documentation

## 2024-11-03 DIAGNOSIS — M3500 Sicca syndrome, unspecified: Secondary | ICD-10-CM | POA: Insufficient documentation

## 2024-11-03 DIAGNOSIS — R739 Hyperglycemia, unspecified: Secondary | ICD-10-CM | POA: Diagnosis not present

## 2024-11-03 DIAGNOSIS — F411 Generalized anxiety disorder: Secondary | ICD-10-CM | POA: Insufficient documentation

## 2024-11-03 DIAGNOSIS — I340 Nonrheumatic mitral (valve) insufficiency: Secondary | ICD-10-CM | POA: Insufficient documentation

## 2024-11-03 DIAGNOSIS — Z85828 Personal history of other malignant neoplasm of skin: Secondary | ICD-10-CM | POA: Insufficient documentation

## 2024-11-03 DIAGNOSIS — R4189 Other symptoms and signs involving cognitive functions and awareness: Secondary | ICD-10-CM | POA: Diagnosis not present

## 2024-11-03 DIAGNOSIS — M160 Bilateral primary osteoarthritis of hip: Secondary | ICD-10-CM | POA: Insufficient documentation

## 2024-11-03 DIAGNOSIS — M8580 Other specified disorders of bone density and structure, unspecified site: Secondary | ICD-10-CM | POA: Insufficient documentation

## 2024-11-03 LAB — HEMOGLOBIN A1C: Hgb A1c MFr Bld: 5.3 % (ref 4.6–6.5)

## 2024-11-03 LAB — B12 AND FOLATE PANEL
Folate: 15.7 ng/mL (ref >5.9)
Vitamin B-12: 475 pg/mL (ref 211–911)

## 2024-11-03 NOTE — Patient Instructions (Signed)
 Sent certification form from insurance Go to lab

## 2024-11-03 NOTE — Assessment & Plan Note (Signed)
 Repeat [id number]

## 2024-11-03 NOTE — Progress Notes (Signed)
 Established Patient Visit  Patient: Susan [name]   DOB: 31-Jan-1924   89 y.o. Female  MRN: [id number] Visit Date: 11/03/2024  Subjective:    Chief Complaint  Patient presents with   Follow-up    4 week follow up transient altered mental status- declined since last visit  Would like to discuss flu and pneumonia vaccines    HPI Accompanied by Daughter-Susan [name]  Hyperglycemia Repeat hgbA1c  Persistent cognitive impairment No change in mental status. She reports persistent hallucination-people in her apartment. Susan [name] (daughter) states she thinks she is confused about staff who checks on her 2x/day. Susan [name] reports persistent confusion about her environment, which has led to wandering in the hallway and 2 attempt to leave the building. [name] also forgets to eat or drink liquids , and to take her medications. [name] has an appointment with neurology 12/31/2024. CT head: no acute finding Normal CBC, CMP and Tsh.  We discussed need for daily supervision. We dicussed need to change [name]'s living arrangement: transfer from independent living to assisted living or memory care facility. Recommended use of ring cameras in [name]'s apartment in the meantime if unable to get 24hrs supervision. We discussed use of rixuelti. Med declined due to possible side effects and need for 24hrs supervision. Check B12, thiamine and folic acid today. Maintain appointment with neurology Susan [name] is to send insurance form to use of long term care insurance to be used.     11/03/2024   11:12 AM 10/06/2024   10:39 AM 01/07/2024    2:09 PM  MMSE - Mini Mental State Exam  Orientation to time 5 4 5   Orientation to Place 4 4 5   Registration 3 2 3   Attention/ Calculation 5 5 5   Attention/Calculation-comments Spelled WORLD backwards    Recall 3 2 2   Language- name 2 objects 2 2 2   Language- name 2 objects-comments Pen, tissue box    Language- repeat 1 1 1   Language- follow 3  step command 3 3 3   Language- read & follow direction 1 1 1   Write a sentence 1 1 1   Copy design 0 1 1  Total score 28 26 29        05/26/2024   11:03 AM 01/07/2024   11:07 AM 01/07/2024   11:06 AM  Depression screen PHQ 2/9  Decreased Interest 0 3 3  Down, Depressed, Hopeless 0 1 1  PHQ - 2 Score 0 4 4  Altered sleeping 0 0   Tired, decreased energy 2 1   Change in appetite 0 1   Feeling bad or failure about yourself  0 0   Trouble concentrating 0 0   Moving slowly or fidgety/restless 0 0   Suicidal thoughts 0 0   PHQ-9 Score 2  6    Difficult doing work/chores Not difficult at all Not difficult at all      Data saved with a previous flowsheet row definition    Reviewed medical, surgical, and social history today  Medications: Outpatient Medications Prior to Visit  Medication Sig   acetaminophen  (TYLENOL ) 650 MG CR tablet Take 650 mg by mouth every 8 (eight) hours as needed for pain.   ALPRAZolam  (XANAX ) 0.5 MG tablet TAKE 1 TABLET BY MOUTH ONCE DAILY AS NEEDED FOR ANXIETY   aspirin  EC 81 MG tablet Take 81 mg by mouth daily. Swallow whole.   cholestyramine  (QUESTRAN ) 4 g packet DISSOLVE &  TAKE 1 POWDER BY MOUTH TWICE DAILY   dicyclomine  (BENTYL ) 10 MG capsule Take 1 capsule by mouth twice daily   diphenoxylate -atropine  (LOMOTIL ) 2.5-0.025 MG tablet TAKE 1 TABLET BY MOUTH TWICE DAILY FOR DIARRHEA OR LOOSE STOOL   DULoxetine  (CYMBALTA ) 20 MG capsule Take 1 capsule (20 mg total) by mouth daily.   famotidine  (PEPCID ) 20 MG tablet Take 1 tablet by mouth once daily   fexofenadine (ALLEGRA) 180 MG tablet Take 180 mg by mouth daily.   fluticasone  (FLONASE ) 50 MCG/ACT nasal spray Use 1 spray(s) in each nostril twice daily   furosemide  (LASIX ) 20 MG tablet Take 1 tablet by mouth once daily   IRON PO Take 65 mg by mouth 3 (three) times a week.   losartan  (COZAAR ) 50 MG tablet Take 1 tablet (50 mg total) by mouth daily.   metoprolol  succinate (TOPROL -XL) 25 MG 24 hr tablet Take 1 tablet  by mouth once daily   potassium chloride  SA (KLOR-CON  M) 20 MEQ tablet Take 1 tablet (20 mEq total) by mouth daily.   Vitamin D , Ergocalciferol , (DRISDOL ) 1.25 MG (50000 UNIT) CAPS capsule Take 1 capsule by mouth once a week   No facility-administered medications prior to visit.   Reviewed past medical and social history.   ROS per HPI above      Objective:  BP 124/70 (BP Location: Right Arm, Patient Position: Sitting, Cuff Size: Normal)   Pulse 62   Temp 98.1 F (36.7 C) (Oral)   Ht 5' (1.524 m)   Wt 101 lb 6.4 oz (46 kg)   SpO2 95%   BMI 19.80 kg/m      Physical Exam Vitals and nursing note reviewed.  Cardiovascular:     Rate and Rhythm: Normal rate.     Pulses: Normal pulses.  Pulmonary:     Effort: Pulmonary effort is normal.  Neurological:     Mental Status: She is alert and oriented to person, place, and time.  Psychiatric:        Mood and Affect: Mood normal.        Speech: Speech is tangential.        Behavior: Behavior is cooperative.        Cognition and Memory: She exhibits impaired recent memory and impaired remote memory.        Judgment: Judgment is inappropriate.     No results found for any visits on 11/03/24.    Assessment & Plan:    Problem List Items Addressed This Visit     Hyperglycemia   Repeat hgbA1c      Relevant Orders   Hemoglobin A1c   Persistent cognitive impairment - Primary   No change in mental status. She reports persistent hallucination-people in her apartment. Susan [name] (daughter) states she thinks she is confused about staff who checks on her 2x/day. Susan [name] reports persistent confusion about her environment, which has led to wandering in the hallway and 2 attempt to leave the building. [name] also forgets to eat or drink liquids , and to take her medications. [name] has an appointment with neurology 12/31/2024. CT head: no acute finding Normal CBC, CMP and Tsh.  We discussed need for daily supervision. We dicussed  need to change [name]'s living arrangement: transfer from independent living to assisted living or memory care facility. Recommended use of ring cameras in [name]'s apartment in the meantime if unable to get 24hrs supervision. We discussed use of rixuelti. Med declined due to possible side effects and need  for 24hrs supervision. Check B12, thiamine and folic acid today. Maintain appointment with neurology Susan [name] is to send insurance form to use of long term care insurance to be used.      Relevant Orders   B12 and Folate Panel   Vitamin B1   Other Visit Diagnoses       Immunization due       Relevant Orders   Pneumococcal conjugate vaccine 20-valent (Prevnar 20) (Completed)      Return in about 3 months (around 02/01/2025) for HTN.     Roselie Mood, NP

## 2024-11-03 NOTE — Assessment & Plan Note (Signed)
 No change in mental status. She reports persistent hallucination-people in her apartment. Susan [name] (daughter) states she thinks she is confused about staff who checks on her 2x/day. Susan [name] reports persistent confusion about her environment, which has led to wandering in the hallway and 2 attempt to leave the building. [name] also forgets to eat or drink liquids , and to take her medications. [name] has an appointment with neurology 12/31/2024. CT head: no acute finding Normal CBC, CMP and Tsh.  We discussed need for daily supervision. We dicussed need to change [name]'s living arrangement: transfer from independent living to assisted living or memory care facility. Recommended use of ring cameras in [name]'s apartment in the meantime if unable to get 24hrs supervision. We discussed use of rixuelti. Med declined due to possible side effects and need for 24hrs supervision. Check B12, thiamine and folic acid today. Maintain appointment with neurology Susan [name] is to send insurance form to use of long term care insurance to be used.

## 2024-11-04 ENCOUNTER — Ambulatory Visit: Payer: Self-pay | Admitting: Nurse Practitioner

## 2024-11-06 LAB — VITAMIN B1: Vitamin B1 (Thiamine): 21 nmol/L (ref 8–30)

## 2024-11-08 ENCOUNTER — Other Ambulatory Visit: Payer: Self-pay | Admitting: Nurse Practitioner

## 2024-11-08 ENCOUNTER — Other Ambulatory Visit: Payer: Self-pay | Admitting: Family Medicine

## 2024-11-08 DIAGNOSIS — F411 Generalized anxiety disorder: Secondary | ICD-10-CM

## 2024-11-08 DIAGNOSIS — K2100 Gastro-esophageal reflux disease with esophagitis, without bleeding: Secondary | ICD-10-CM

## 2024-11-10 NOTE — Addendum Note (Signed)
 Addended by: LENON ROUGHEN on: 11/10/2024 03:57 PM   Modules accepted: Orders

## 2024-11-11 MED ORDER — ALPRAZOLAM 0.5 MG PO TABS: 0.5000 mg | ORAL_TABLET | Freq: Every day | ORAL | 5 refills | Status: DC | PRN

## 2024-11-12 DIAGNOSIS — Z0279 Encounter for issue of other medical certificate: Secondary | ICD-10-CM

## 2024-11-13 ENCOUNTER — Encounter: Payer: Self-pay | Admitting: Nurse Practitioner

## 2024-11-14 ENCOUNTER — Telehealth: Payer: Self-pay

## 2024-11-14 NOTE — Telephone Encounter (Signed)
 Copied from CRM #[id number]. Topic: General - Other >> Nov 14, 2024  2:56 PM Burnard DEL wrote: Reason for CRM: Karna from [facility] called in stating that family   Came and  did a tour of their facility and would like to place patient in their facility. She would like to know if history and physcial/clinical notes  and medication list could be faxed over to them so that  they could complete an assessment on patient?   Fax#:[phone]

## 2024-11-18 ENCOUNTER — Telehealth: Payer: Self-pay

## 2024-11-18 NOTE — Telephone Encounter (Signed)
 FL2 forms from [facility] [place] [facility] placed in the folder for Dr. Berneta to sign in the absence of Roselie Mood, NP.

## 2024-11-18 NOTE — Telephone Encounter (Signed)
 Called [facility] and left a voice messgae for Karna to give me a call back at the office. Wanted to inform her that Roselie Mood, NP is out of the office and asking if it will be okay for her physician supervisor Dr. Elsie Lent.

## 2024-11-19 NOTE — Telephone Encounter (Signed)
 Copied from CRM #[id number]. Topic: Clinical - Medical Advice >> Nov 17, 2024  1:36 PM Jayma L wrote: Reason for CRM: denise from [facility] called asking for HNP, clinical notes, medical list , FL2, diet order and TB test results , asking for the latest results, this was faxed to us  Friday and today , best number is [phone] >> Nov 18, 2024 11:16 AM Franky GRADE wrote:  denise from [facility] is returning a call she received from Charley Miske, called [facility] and was advised to send a CRM.

## 2024-11-19 NOTE — Telephone Encounter (Signed)
 Returned call to Susan [name] at [employer] and informed her that since Roselie Mood, NP is out of the ofifce at the moment that her supervisoring physician will need to fill out the FL2. I informed her that once the form is completed that I will fax the form and all of the reqesting documents to her. She thanked me for calling her with the update and will be looking for the fax when faxed.

## 2024-11-20 ENCOUNTER — Other Ambulatory Visit: Payer: Self-pay | Admitting: Family Medicine

## 2024-11-20 ENCOUNTER — Emergency Department (HOSPITAL_BASED_OUTPATIENT_CLINIC_OR_DEPARTMENT_OTHER)
Admission: EM | Admit: 2024-11-20 | Discharge: 2024-11-20 | Disposition: A | Discharge status: Home / Self Care | Attending: Emergency Medicine | Admitting: Emergency Medicine

## 2024-11-20 ENCOUNTER — Encounter (HOSPITAL_BASED_OUTPATIENT_CLINIC_OR_DEPARTMENT_OTHER): Payer: Self-pay | Admitting: Emergency Medicine

## 2024-11-20 ENCOUNTER — Other Ambulatory Visit: Payer: Self-pay

## 2024-11-20 ENCOUNTER — Encounter: Payer: Self-pay | Admitting: Nurse Practitioner

## 2024-11-20 DIAGNOSIS — I872 Venous insufficiency (chronic) (peripheral): Secondary | ICD-10-CM | POA: Insufficient documentation

## 2024-11-20 DIAGNOSIS — I878 Other specified disorders of veins: Secondary | ICD-10-CM

## 2024-11-20 DIAGNOSIS — Z7982 Long term (current) use of aspirin: Secondary | ICD-10-CM | POA: Insufficient documentation

## 2024-11-20 NOTE — ED Provider Notes (Signed)
 [place] EMERGENCY DEPARTMENT AT [facility] Provider Note   CSN: [id number] Arrival date & time: 11/20/24  1151     Patient presents with: Leg Pain   Susan [name] is a 89 y.o. female.   HPI Patient is scheduled to be seen by wound care in 2 weeks.  However, her daughters became concerned that the patient might have developed infection.  She has had cellulitis in the past and required hospitalization.  They have been doing elevating and carefully cleaning her feet and applying some cool packs for comfort.  The patient and her daughter report that the patient is unable to wear compression hose because they are too difficult for her to get on and off and they are painful for her.  Patient denies any chest pain or shortness of breath.    Prior to Admission medications  Medication Sig Start Date End Date Taking? Authorizing Provider  acetaminophen  (TYLENOL ) 650 MG CR tablet Take 650 mg by mouth every 8 (eight) hours as needed for pain.    [provider]  ALPRAZolam  (XANAX ) 0.5 MG tablet Take 1 tablet (0.5 mg total) by mouth daily as needed for anxiety. 11/11/24   Nche, Roselie Rockford, NP  aspirin  EC 81 MG tablet Take 81 mg by mouth daily. Swallow whole.    [provider]  cholestyramine  (QUESTRAN ) 4 g packet DISSOLVE & TAKE 1 POWDER BY MOUTH TWICE DAILY 08/11/24   Abran Norleen SAILOR, MD  dicyclomine  (BENTYL ) 10 MG capsule Take 1 capsule by mouth twice daily 08/20/24   Nche, Charlotte Lum, NP  diphenoxylate -atropine  (LOMOTIL ) 2.5-0.025 MG tablet TAKE 1 TABLET BY MOUTH TWICE DAILY FOR DIARRHEA OR LOOSE STOOL 08/21/24   Abran Norleen SAILOR, MD  DULoxetine  (CYMBALTA ) 20 MG capsule Take 1 capsule by mouth once daily 11/20/24   Smith, Zachary M, DO  famotidine  (PEPCID ) 20 MG tablet Take 1 tablet by mouth once daily 11/10/24   Nche, Roselie Rockford, NP  fexofenadine (ALLEGRA) 180 MG tablet Take 180 mg by mouth daily.    [provider]  fluticasone  (FLONASE ) 50 MCG/ACT  nasal spray Use 1 spray(s) in each nostril twice daily 01/02/23   Nche, Roselie Rockford, NP  furosemide  (LASIX ) 20 MG tablet Take 1 tablet by mouth once daily 09/08/24   Nche, Roselie Rockford, NP  IRON PO Take 65 mg by mouth 3 (three) times a week.    [provider]  losartan  (COZAAR ) 50 MG tablet Take 1 tablet (50 mg total) by mouth daily. 01/07/24   Nche, Roselie Rockford, NP  metoprolol  succinate (TOPROL -XL) 25 MG 24 hr tablet Take 1 tablet by mouth once daily 09/15/24   Nche, Charlotte Lum, NP  potassium chloride  SA (KLOR-CON  M) 20 MEQ tablet Take 1 tablet (20 mEq total) by mouth daily. 01/23/24   Beola Terrall RAMAN, PA-C  Vitamin D , Ergocalciferol , (DRISDOL ) 1.25 MG (50000 UNIT) CAPS capsule Take 1 capsule by mouth once a week 11/08/24   Smith, Zachary M, DO    Allergies: Butrans [buprenorphine], Codeine, Morphine and codeine, and Morphine sulfate    Review of Systems  Updated Vital Signs BP (!) 152/71 (BP Location: Right Arm)   Pulse 65   Temp 98 F (36.7 C)   Resp 17   SpO2 96%   Physical Exam Constitutional:      Comments: Alert nontoxic.  No respiratory distress.  HENT:     Mouth/Throat:     Pharynx: Oropharynx is clear.  Cardiovascular:     Rate  and Rhythm: Normal rate and regular rhythm.     Comments: 3/6 systolic ejection murmur. Pulmonary:     Effort: Pulmonary effort is normal.     Breath sounds: Normal breath sounds.  Abdominal:     General: There is no distension.     Palpations: Abdomen is soft.     Tenderness: There is no abdominal tenderness. There is no guarding.  Musculoskeletal:     Comments: Patient has soft pitting edema of the dorsum of the feet and ankles.  This appears to be less edematous than previously as the skin is pliable.  Patient has extensive skin thinning of the lower legs with hyperpigmentation and small ecchymoses.  Patient does not have any appearance of active infection or active wound.  Skin surfaces are intact.  There is not warmth or  erythema.  Skin:    General: Skin is warm and dry.  Neurological:     General: No focal deficit present.     Comments: Patient is alert.  Speech is clear.  Speech is situationally appropriate but there is a fair amount of repetitiveness that suggest some short-term memory impairment  Psychiatric:        Mood and Affect: Mood normal.     (all labs ordered are listed, but only abnormal results are displayed) Labs Reviewed - No data to display  EKG: None  Radiology: No results found.   Procedures   Medications Ordered in the ED - No data to display                                  Medical Decision Making  Patient presents as outlined.  Chief concern from the patient's daughter was for infection.  We reviewed the patient's history and it is evident that she does have chronic venous insufficiency and already has a referral to wound care clinic.  On exam I do not suspect secondary infection at this time.  There are no active wounds and patient does not have cellulitic appearance.  Swelling is actually down somewhat today compared to several days ago per patient's report and by the appreciable flaccid edema of the dorsum of the feet.  Patient concerned that the swelling might cause heart problems.  Patient does not show evidence of active CHF.  Lungs are clear without crackles.  Patient does have a murmur present but is not experiencing dyspnea or chest pain.  Patient's echo from 0\82\75 shows normal EF, normal aortic valve, moderate mitral valve regurg without stenosis and moderate tricuspid regurgitation.  Cardiology recommendation was for only repeating echo if patient change symptomatically.  At this time this appears to be exclusively venous stasis which is well-controlled in the sense that patient does not have active cellulitis or active wound.  Patient stable to follow-up with wound care as scheduled and continued home care.     Final diagnoses:  Chronic venous stasis    ED  Discharge Orders     None          Armenta Canning, MD 11/20/24 1358
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
## 2024-11-20 NOTE — ED Notes (Signed)
 Discharge paperwork reviewed entirely with patient, including follow up care. Pain was under control. No prescriptions were called in, but all questions were addressed.  Pt verbalized understanding as well as all parties involved. No questions or concerns voiced at the time of discharge. No acute distress noted. Pt was encouraged to stay adequately hydrated and eat a healthy diet.   Pt was wheeled out to the PVA in a wheelchair without incident.  Pt advised they will seek followup care with a specialist and followup with their PCP.  Wound care followup to be done this week.   The pt was instructed to set up and/or review MyChart for their results; and was informed their Providers all have access to the information as well.

## 2024-11-20 NOTE — Discharge Instructions (Signed)
 1.  At this time I do not see any signs of infection in the legs. 2.  Chronic venous insufficiency\venous stasis is a fairly common problem but does not have a cure.  It is managed by compression and elevating and protecting your legs from any wounds which are very difficult to heal. 3.  Since you cannot wear compression hose, when you are seen at the wound care clinic, they may suggest wraps that you will wear which can be helpful. 4.  Currently you do not have any active wounds or signs of infection.  With this condition, skin is always very fragile and prone to wounds and infection.  Try to protect your legs as well as you can and elevate is much as possible.

## 2024-11-21 NOTE — Telephone Encounter (Signed)
 FL2 form, med list, last OV note, and chest Xray report faxed to [facility] [place] this afternoon.

## 2024-11-21 NOTE — Telephone Encounter (Signed)
 Called patient's daughter Violette [name] Arts who is on the [facility] on file. I informed her that I will get back with her on how to get originals of the DNR. I also informed her that the [facility] and the needed documents will be faxed to [facility] at [place] today. She thanked me for the update on the [facility] and verbalized that she will wait to hear back from me once I get the needed information about the DNR

## 2024-11-24 NOTE — Telephone Encounter (Signed)
 Patient's daughter Barnie Arts who is POA and on DPR come into the office to get 5 signed DNR sheet for herself for her records and for the facility that is requesting 4 original DNR for their records for patient's wall above her bed, the [facility] chart and wherever else the facility is needing

## 2024-11-25 ENCOUNTER — Other Ambulatory Visit: Payer: Self-pay

## 2024-11-25 ENCOUNTER — Emergency Department (HOSPITAL_COMMUNITY)
Admission: EM | Admit: 2024-11-25 | Discharge: 2024-11-26 | Disposition: A | Discharge status: Home / Self Care | Attending: Emergency Medicine | Admitting: Emergency Medicine

## 2024-11-25 ENCOUNTER — Other Ambulatory Visit: Payer: Self-pay | Admitting: Internal Medicine

## 2024-11-25 ENCOUNTER — Encounter (HOSPITAL_COMMUNITY): Payer: Self-pay | Admitting: Emergency Medicine

## 2024-11-25 DIAGNOSIS — Z7982 Long term (current) use of aspirin: Secondary | ICD-10-CM | POA: Diagnosis not present

## 2024-11-25 DIAGNOSIS — S8992XA Unspecified injury of left lower leg, initial encounter: Secondary | ICD-10-CM | POA: Diagnosis present

## 2024-11-25 DIAGNOSIS — Z23 Encounter for immunization: Secondary | ICD-10-CM | POA: Diagnosis not present

## 2024-11-25 DIAGNOSIS — S81812A Laceration without foreign body, left lower leg, initial encounter: Secondary | ICD-10-CM | POA: Diagnosis not present

## 2024-11-25 DIAGNOSIS — Z79899 Other long term (current) drug therapy: Secondary | ICD-10-CM | POA: Insufficient documentation

## 2024-11-25 DIAGNOSIS — I10 Essential (primary) hypertension: Secondary | ICD-10-CM | POA: Insufficient documentation

## 2024-11-25 DIAGNOSIS — W228XXA Striking against or struck by other objects, initial encounter: Secondary | ICD-10-CM | POA: Insufficient documentation

## 2024-11-25 MED ORDER — TETANUS-DIPHTH-ACELL PERTUSSIS 5-2-15.5 LF-MCG/0.5 IM SUSP
0.5000 mL | Freq: Once | INTRAMUSCULAR | Status: AC
  Administered 2024-11-26: 0.5 mL via INTRAMUSCULAR
  Filled 2024-11-25: qty 0.5

## 2024-11-25 MED ORDER — LIDOCAINE-EPINEPHRINE-TETRACAINE (LET) TOPICAL GEL
3.0000 mL | Freq: Once | TOPICAL | Status: AC
  Administered 2024-11-26: 3 mL via TOPICAL
  Filled 2024-11-25: qty 3

## 2024-11-25 NOTE — ED Provider Notes (Signed)
 " [place] EMERGENCY DEPARTMENT AT [facility] Provider Note   CSN: [id number] Arrival date & time: 11/25/24  2247     Patient presents with: Skin Tear   Susan [name] is a 89 y.o. female.   The history is provided by the patient, a relative and medical records.  Susan [name] is a 89 y.o. female who presents to the Emergency Department complaining of skin tear.  She presents to the emergency department for evaluation of skin tear to the distal left leg.  She states that she was getting out of her bed and does not know which she struck it on.  She complains of mild discomfort in this area.  Tetanus is unknown.  She has had ongoing issues with lower extremity edema, unchanged.  She has already set up an appointment with wound care because of her lower extremity edema and has an appointment scheduled for mid January.  Denies any fevers, chest pain, shortness of breath.  Appetite is baseline. Has a history of hypertension.    Prior to Admission medications  Medication Sig Start Date End Date Taking? Authorizing Provider  acetaminophen  (TYLENOL ) 650 MG CR tablet Take 650 mg by mouth every 8 (eight) hours as needed for pain.    [provider]  ALPRAZolam  (XANAX ) 0.5 MG tablet Take 1 tablet (0.5 mg total) by mouth daily as needed for anxiety. 11/11/24   Nche, Roselie Rockford, NP  aspirin  EC 81 MG tablet Take 81 mg by mouth daily. Swallow whole.    [provider]  cholestyramine  (QUESTRAN ) 4 g packet DISSOLVE & TAKE 1 POWDER BY MOUTH TWICE DAILY 08/11/24   Abran Norleen SAILOR, MD  dicyclomine  (BENTYL ) 10 MG capsule Take 1 capsule by mouth twice daily 08/20/24   Nche, Charlotte Lum, NP  diphenoxylate -atropine  (LOMOTIL ) 2.5-0.025 MG tablet TAKE 1 TABLET BY MOUTH TWICE DAILY FOR DIARRHEA OR LOOSE STOOL 08/21/24   Abran Norleen SAILOR, MD  DULoxetine  (CYMBALTA ) 20 MG capsule Take 1 capsule by mouth once daily 11/20/24   Smith, Zachary M, DO  famotidine  (PEPCID ) 20 MG tablet  Take 1 tablet by mouth once daily 11/10/24   Nche, Roselie Rockford, NP  fexofenadine (ALLEGRA) 180 MG tablet Take 180 mg by mouth daily.    [provider]  fluticasone  (FLONASE ) 50 MCG/ACT nasal spray Use 1 spray(s) in each nostril twice daily 01/02/23   Nche, Charlotte Lum, NP  furosemide  (LASIX ) 20 MG tablet Take 1 tablet by mouth once daily 09/08/24   Nche, Roselie Rockford, NP  IRON PO Take 65 mg by mouth 3 (three) times a week.    [provider]  losartan  (COZAAR ) 50 MG tablet Take 1 tablet (50 mg total) by mouth daily. 01/07/24   Nche, Roselie Rockford, NP  metoprolol  succinate (TOPROL -XL) 25 MG 24 hr tablet Take 1 tablet by mouth once daily 09/15/24   Nche, Charlotte Lum, NP  potassium chloride  SA (KLOR-CON  M) 20 MEQ tablet Take 1 tablet (20 mEq total) by mouth daily. 01/23/24   Beola Terrall RAMAN, PA-C  Vitamin D , Ergocalciferol , (DRISDOL ) 1.25 MG (50000 UNIT) CAPS capsule Take 1 capsule by mouth once a week 11/08/24   Smith, Zachary M, DO    Allergies: Butrans [buprenorphine], Codeine, Morphine and codeine, and Morphine sulfate    Review of Systems  All other systems reviewed and are negative.   Updated Vital Signs BP (!) 157/75   Pulse 68   Temp 98.2 F (36.8 C) (Oral)   Resp  17   SpO2 95%   Physical Exam Vitals and nursing note reviewed.  Constitutional:      Appearance: She is well-developed.  HENT:     Head: Normocephalic and atraumatic.  Cardiovascular:     Rate and Rhythm: Normal rate and regular rhythm.  Pulmonary:     Effort: Pulmonary effort is normal. No respiratory distress.  Musculoskeletal:        General: No tenderness.  Skin:    General: Skin is warm and dry.     Comments: There is a more half dollar sized skin tear to the lateral left leg.  There is soft tissue swelling to bilateral lower extremities.  Faint pedal pulses bilaterally.  Bilateral legs are warm and well-perfused.  Neurological:     Mental Status: She is alert and oriented to person,  place, and time.  Psychiatric:        Behavior: Behavior normal.     (all labs ordered are listed, but only abnormal results are displayed) Labs Reviewed - No data to display  EKG: None  Radiology: No results found.   Debridement  Date/Time: 11/26/2024 12:52 AM  Performed by: Griselda Norris, MD Authorized by: Griselda Norris, MD  Consent: Verbal consent obtained Patient identity confirmed: verbally with patient Preparation: Patient was prepped and draped in the usual sterile fashion. Local anesthesia used: LET.  Anesthesia: Local anesthesia used: LET.  Sedation: Patient sedated: no       Medications Ordered in the ED  lidocaine -EPINEPHrine -tetracaine  (LET) topical gel (3 mLs Topical Given 11/26/24 0025)  Tdap (ADACEL ) injection 0.5 mL (0.5 mLs Intramuscular Given 11/26/24 0025)                                    Medical Decision Making Risk Prescription drug management.   Patient here for evaluation of wound to the left leg.  She does have a skin tear on examination.  This was debrided of devitalized tissue.  Dressing applied by nursing staff.  Feel she is stable for discharge back to facility.  Discussed wound care.  Discussed PCP follow-up as well as return precautions.  Tetanus updated.       Final diagnoses:  Skin tear of left lower leg without complication, initial encounter    ED Discharge Orders     None          Griselda Norris, MD 11/26/24 0110  "

## 2024-11-25 NOTE — Progress Notes (Signed)
 " [name] [address] [place] [address] Phone: [phone] Subjective:   Susan [name], am serving as a scribe for Dr. Arthea Claudene.  I'm seeing this patient by the request  of:  Nche, Roselie Rockford, NP  CC: Bilateral knee pain but right greater than left  YEP:Dlagzrupcz  09/23/2024 Right sided injected again today.  Discussed with patient about icing regimen and home exercises.  Do feel that we will get hopefully 2 to 3 months out of this.  Have attempted viscosupplementation with very minimal benefits in the past.  Increase activity slowly.  Discussed icing regimen.  Follow-up again in 6 to 8 weeks otherwise.  Instability noted with valgus and varus force.  Will attempt a hinged brace on the individual.     Updated 12/01/2024 Susan [name] is a 89 y.o. female coming in with complaint of B knee pain. Injured her L lower leg. Went to ER and has appointment with PCP. Changing bandage numerous times. R knee pain. L knee is doing well.       Past Medical History:  Diagnosis Date   Actinic keratosis, hx of    chest wall (2012)   Allergic rhinitis    Anxiety    Arthritis    spine, hips (01/30/2017)   Bursitis    Cellulitis 07/20/2023   Cellulitis of right foot 01/30/2017   Colon polyps    Diverticulosis    Elevated brain natriuretic peptide (BNP) level 01/23/2024   Elevated troponin 01/30/2017   Esophageal dysmotilities 10/16/2017   Fibromyalgia    qwhere (01/30/2017)   Gallstones    GERD (gastroesophageal reflux disease)    High cholesterol    History of kidney stones    History of stomach ulcers    Hx of squamous cell carcinoma excision    right mandible (2011), right arm (2014)   Hypertension    Hypertensive urgency 01/23/2024   Hyponatremia 07/20/2023   IBS (irritable bowel syndrome)    Lymphangitis 12/06/2018   Osteoarthritis    Pneumonia 2000s X 2   twice (01/30/2017)   Pyloric stenosis    Shortness of  breath 01/23/2024   Stroke (HCC) 10/2014   denies residual on 01/30/2017   Past Surgical History:  Procedure Laterality Date   APPENDECTOMY     CARDIOVASCULAR STRESS TEST  04/2016   nuclear cardiolite stress test done in [place]  (no ischemia, no LVH, no LV systolic function)   CATARACT EXTRACTION W/ INTRAOCULAR LENS  IMPLANT, BILATERAL Bilateral    CHOLECYSTECTOMY OPEN     COLONOSCOPY  2009, 2014   DIAGNOSTIC MAMMOGRAM  2009, 2012   TONSILLECTOMY     Social History   Socioeconomic History   Marital status: Widowed    Spouse name: Not on file   Number of children: 4   Years of education: Not on file   Highest education level: Not on file  Occupational History   Occupation: retired  Tobacco Use   Smoking status: Never   Smokeless tobacco: Never  Vaping Use   Vaping status: Never Used  Substance and Sexual Activity   Alcohol use: Not Currently    Comment: glass of red wine maybe once week   Drug use: No   Sexual activity: Not on file  Other Topics Concern   Not on file  Social History Narrative   Not on file   Social Drivers of Health   Tobacco Use: Low Risk (11/25/2024)   Patient History  Smoking Tobacco Use: Never    Smokeless Tobacco Use: Never    Passive Exposure: Not on file  Financial Resource Strain: Low Risk (05/26/2024)   Overall Financial Resource Strain (CARDIA)    Difficulty of Paying Living Expenses: Not hard at all  Food Insecurity: No Food Insecurity (05/26/2024)   Epic    Worried About Programme Researcher, Broadcasting/film/video in the Last Year: Never true    Ran Out of Food in the Last Year: Never true  Transportation Needs: No Transportation Needs (05/26/2024)   Epic    Lack of Transportation (Medical): No    Lack of Transportation (Non-Medical): No  Physical Activity: Inactive (05/26/2024)   Exercise Vital Sign    Days of Exercise per Week: 0 days    Minutes of Exercise per Session: 0 min  Stress: No Stress Concern Present (05/26/2024)   [employer] of  Occupational Health - Occupational Stress Questionnaire    Feeling of Stress: Not at all  Social Connections: Socially Isolated (05/26/2024)   Social Connection and Isolation Panel    Frequency of Communication with Friends and Family: More than three times a week    Frequency of Social Gatherings with Friends and Family: Three times a week    Attends Religious Services: Never    Active Member of Clubs or Organizations: No    Attends Banker Meetings: Never    Marital Status: Widowed  Depression (PHQ2-9): Low Risk (05/26/2024)   Depression (PHQ2-9)    PHQ-2 Score: 2  Alcohol Screen: Low Risk (05/26/2024)   Alcohol Screen    Last Alcohol Screening Score (AUDIT): 0  Housing: Unknown (05/26/2024)   Epic    Unable to Pay for Housing in the Last Year: No    Number of Times Moved in the Last Year: Not on file    Homeless in the Last Year: No  Utilities: Not At Risk (05/26/2024)   Epic    Threatened with loss of utilities: No  Health Literacy: Adequate Health Literacy (05/26/2024)   B1300 Health Literacy    Frequency of need for help with medical instructions: Never   Allergies[1] Family History  Problem Relation Age of Onset   Stroke Mother    Heart attack Mother    Stroke Father    Heart attack Father    Heart attack Maternal Grandmother    Stroke Maternal Grandmother    Stroke Paternal Grandmother    Heart attack Paternal Grandmother    Heart attack Maternal Grandfather    Stroke Maternal Grandfather    Stroke Paternal Grandfather    Heart attack Paternal Grandfather    Colon cancer Neg Hx    Pancreatic cancer Neg Hx    Stomach cancer Neg Hx    Esophageal cancer Neg Hx     Current Outpatient Medications (Cardiovascular):    cholestyramine  (QUESTRAN ) 4 g packet, DISSOLVE & TAKE 1 POWDER BY MOUTH TWICE DAILY   furosemide  (LASIX ) 20 MG tablet, Take 1 tablet by mouth once daily   losartan  (COZAAR ) 50 MG tablet, Take 1 tablet (50 mg total) by mouth daily.    metoprolol  succinate (TOPROL -XL) 25 MG 24 hr tablet, Take 1 tablet by mouth once daily  Current Outpatient Medications (Respiratory):    fexofenadine (ALLEGRA) 180 MG tablet, Take 180 mg by mouth daily.   fluticasone  (FLONASE ) 50 MCG/ACT nasal spray, Use 1 spray(s) in each nostril twice daily  Current Outpatient Medications (Analgesics):    acetaminophen  (TYLENOL ) 650 MG CR tablet, Take 650 mg  by mouth every 8 (eight) hours as needed for pain.   aspirin  EC 81 MG tablet, Take 81 mg by mouth daily. Swallow whole.  Current Outpatient Medications (Hematological):    IRON PO, Take 65 mg by mouth 3 (three) times a week.  Current Outpatient Medications (Other):    doxycycline  (VIBRA -TABS) 100 MG tablet, Take 1 tablet (100 mg total) by mouth 2 (two) times daily.   ALPRAZolam  (XANAX ) 0.5 MG tablet, Take 1 tablet (0.5 mg total) by mouth daily as needed for anxiety.   dicyclomine  (BENTYL ) 10 MG capsule, Take 1 capsule by mouth twice daily   diphenoxylate -atropine  (LOMOTIL ) 2.5-0.025 MG tablet, TAKE 1 TABLET BY MOUTH TWICE DAILY FOR DIARRHEA OR LOOSE STOOL   DULoxetine  (CYMBALTA ) 20 MG capsule, Take 1 capsule by mouth once daily   famotidine  (PEPCID ) 20 MG tablet, Take 1 tablet by mouth once daily   potassium chloride  SA (KLOR-CON  M) 20 MEQ tablet, Take 1 tablet (20 mEq total) by mouth daily.   Vitamin D , Ergocalciferol , (DRISDOL ) 1.25 MG (50000 UNIT) CAPS capsule, Take 1 capsule by mouth once a week   Reviewed prior external information including notes and imaging from  primary care provider As well as notes that were available from care everywhere and other healthcare systems.  Past medical history, social, surgical and family history all reviewed in electronic medical record.  No pertanent information unless stated regarding to the chief complaint.   Review of Systems:  No headache, visual changes, nausea, vomiting, diarrhea, constipation, dizziness, abdominal pain, skin rash, fevers, chills,  night sweats, weight loss, swollen lymph nodes, body aches, joint swelling, chest pain, shortness of breath, mood changes. POSITIVE muscle aches  Objective  Blood pressure 116/66, pulse 80, height 5' (1.524 m), weight 101 lb (45.8 kg), SpO2 96%.   General: No apparent distress alert and oriented x3 mood and affect normal, dressed appropriately.  HEENT: Pupils equal, extraocular movements intact  Respiratory: Patient's speak in full sentences and does not appear short of breath  Cardiovascular: No lower extremity edema, non tender, no erythema  Right knee arthritis noted patient does have instability with valgus and varus force.  Left leg just 2 cm proximal to the ankle on the anterior lateral shin does have tenderness noted.  No sign of any infectious etiology.   After informed written and verbal consent, patient was seated on exam table. Right knee was prepped with alcohol swab and utilizing anterolateral approach, patient's right knee space was injected with 4:1  marcaine 0.5%: Kenalog  40mg /dL. Patient tolerated the procedure well without immediate complications.   Impression and Recommendations:    The above documentation has been reviewed and is accurate and complete Arthea CHRISTELLA Sharps, DO        [1]  Allergies Allergen Reactions   Butrans [Buprenorphine] Other (See Comments)    Severe chest pains   Codeine Other (See Comments)    Severe Chest pains  Other Reaction(s): severe chest pain   Morphine And Codeine Other (See Comments)    Severe chest pains   Morphine Sulfate     Other Reaction(s): severe chest pain    "

## 2024-11-25 NOTE — ED Triage Notes (Signed)
 Pt BIBA from [facility] c/o large skin tear to left leg. Pt states she fell trying to get up and use the bathroom, hit her leg on her bed. Denies LOC, dizziness or n/v.

## 2024-11-26 DIAGNOSIS — S81812A Laceration without foreign body, left lower leg, initial encounter: Secondary | ICD-10-CM | POA: Diagnosis not present

## 2024-11-28 ENCOUNTER — Other Ambulatory Visit: Payer: Self-pay | Admitting: Internal Medicine

## 2024-11-29 ENCOUNTER — Other Ambulatory Visit: Payer: Self-pay | Admitting: Internal Medicine

## 2024-12-01 ENCOUNTER — Encounter: Payer: Self-pay | Admitting: Family Medicine

## 2024-12-01 ENCOUNTER — Telehealth: Payer: Self-pay | Admitting: Internal Medicine

## 2024-12-01 ENCOUNTER — Ambulatory Visit (INDEPENDENT_AMBULATORY_CARE_PROVIDER_SITE_OTHER): Admitting: Family Medicine

## 2024-12-01 ENCOUNTER — Other Ambulatory Visit: Payer: Self-pay | Admitting: Internal Medicine

## 2024-12-01 ENCOUNTER — Other Ambulatory Visit: Payer: Self-pay | Admitting: Nurse Practitioner

## 2024-12-01 VITALS — BP 116/66 | HR 80 | Ht 60.0 in | Wt 101.0 lb

## 2024-12-01 DIAGNOSIS — I872 Venous insufficiency (chronic) (peripheral): Secondary | ICD-10-CM

## 2024-12-01 DIAGNOSIS — K529 Noninfective gastroenteritis and colitis, unspecified: Secondary | ICD-10-CM

## 2024-12-01 DIAGNOSIS — M170 Bilateral primary osteoarthritis of knee: Secondary | ICD-10-CM

## 2024-12-01 DIAGNOSIS — M1711 Unilateral primary osteoarthritis, right knee: Secondary | ICD-10-CM

## 2024-12-01 MED ORDER — DOXYCYCLINE HYCLATE 100 MG PO TABS: 100.0000 mg | ORAL_TABLET | Freq: Two times a day (BID) | ORAL | 0 refills | Status: AC

## 2024-12-01 NOTE — Telephone Encounter (Signed)
 Inbound call from patient daughter stating that she is needing to speak to someone in regards to her mother medication and getting a refill for them. Name of the medication is Questran  4 g packet and Lomotil  2.5-0.025 MG tablet. Please advise.

## 2024-12-01 NOTE — Patient Instructions (Addendum)
 Injection in knee today Good to see you! Happy New Year See you again in 8 weeks

## 2024-12-01 NOTE — Assessment & Plan Note (Signed)
 Patient given injection on the right knee.  He tolerated the procedure well.  Discussed with patient to continue to stay active.  Patient is 89 years old.  Doing very well but is going to be moving into an assisted living apartment.  Follow-up with me again in 6 to 8 weeks

## 2024-12-01 NOTE — Assessment & Plan Note (Addendum)
 Will need to continue to monitor.  Discussed with patient With the healing ulcer will need to continue to monitor.  Will be seen wound care in the near future.  Doxycycline  given as well with the open wound and Tegaderm given as well.

## 2024-12-02 ENCOUNTER — Encounter: Payer: Self-pay | Admitting: Internal Medicine

## 2024-12-02 ENCOUNTER — Ambulatory Visit (INDEPENDENT_AMBULATORY_CARE_PROVIDER_SITE_OTHER): Admitting: Internal Medicine

## 2024-12-02 VITALS — BP 116/66 | HR 54 | Temp 97.8°F | Ht 60.0 in | Wt 100.6 lb

## 2024-12-02 DIAGNOSIS — S81812A Laceration without foreign body, left lower leg, initial encounter: Secondary | ICD-10-CM | POA: Diagnosis not present

## 2024-12-02 DIAGNOSIS — I872 Venous insufficiency (chronic) (peripheral): Secondary | ICD-10-CM

## 2024-12-02 NOTE — Progress Notes (Signed)
 " [facility]IMARY [facility] [address] [place] [address] Dept: [phone] Dept Fax: [phone]  Acute Care Office Visit  Subjective:   Susan [name] 1924-03-30 12/02/2024  Chief Complaint  Patient presents with   Leg Injury    Left leg fell Wednesday swollen draining some pain     HPI:  Discussed the use of AI scribe software for clinical note transcription with the patient, who gave verbal consent to proceed.  History of Present Illness   Susan [name] is a 89 year old female who presents with a skin tear on her left leg. She is accompanied by her daughter, Alan.  On November 25, 2024, she sustained a skin tear on her left leg while getting out of bed. Unknown what object patient hit her leg on. The wound was cleaned and dressed in the emergency department, and she received a TDAP update.  The skin tear has been continuously oozing, saturating her clothing and shoes despite frequent dressing changes. Initially, a nonstick bandage with gauze and tape was used, but her orthopedic doctor recently changed the bandage type to help manage the drainage. She has been using Neosporin on the wound.  She was started on doxycycline  by orthopedic yesterday, December 01, 2024, to be taken twice daily for ten days.   She has a history of osteoarthritis in both knees and received a joint injection in her left knee on December 01, 2024. She also has a history of venous insufficiency , with pitting edema noted to bilateral LE's.   She does have appt with wound care on 12/16/2023.     The following portions of the patient's history were reviewed and updated as appropriate: past medical history, past surgical history, family history, social history, allergies, medications, and problem list.   Patient Active Problem List   Diagnosis Date Noted   GAD (generalized anxiety disorder) 11/03/2024   History of malignant neoplasm of  skin 11/03/2024   Non-rheumatic mitral regurgitation 11/03/2024   Nonrheumatic aortic valve stenosis 11/03/2024   Primary osteoarthritis of both hips 11/03/2024   Osteopenia 11/03/2024   Sjogren's disease 11/03/2024   Vitamin D  deficiency 11/03/2024   Precordial pain 01/23/2024   Demand ischemia (HCC) 01/23/2024   Left carotid stenosis 01/07/2024   Atypical chest pain 08/15/2023   Intermittent palpitations 08/15/2023   Carcinoma in situ of skin of upper limb, including shoulder 07/09/2023   Chronic fatigue syndrome with fibromyalgia 07/09/2023   Persistent cognitive impairment 01/08/2023   Sensorineural hearing loss (SNHL) 08/14/2022   TMJ (temporomandibular joint disorder) 08/14/2022   Murmur, cardiac 04/03/2022   Degenerative arthritis of knee, bilateral 12/14/2021   Lumbar radiculopathy 09/22/2020   Tibialis posterior tendinitis, right 11/05/2019   DNR (do not resuscitate) discussion 02/20/2019   Chronic diarrhea 12/03/2018   Venous insufficiency of both lower extremities 11/06/2018   Hyperglycemia 05/01/2018   Esophageal dysmotilities 10/16/2017   Anxiety 05/16/2017   Hearing loss 05/16/2017   Greater trochanteric bursitis of both hips 04/24/2017   Sicca syndrome 04/06/2017   Pyloric stenosis 04/06/2017   Hyperlipidemia 04/06/2017   Macular degeneration 04/06/2017   Fibromyalgia 03/21/2017   History of CVA (cerebrovascular accident) 03/21/2017   Irritable bowel syndrome 03/16/2017   Dysphagia 03/16/2017   GERD with esophagitis 03/16/2017   Arthritis of hip 02/13/2017   Essential hypertension 01/30/2017   Past Medical History:  Diagnosis Date   Actinic keratosis, hx of    chest wall (2012)   Allergic rhinitis  Anxiety    Arthritis    spine, hips (01/30/2017)   Bursitis    Cellulitis 07/20/2023   Cellulitis of right foot 01/30/2017   Colon polyps    Diverticulosis    Elevated brain natriuretic peptide (BNP) level 01/23/2024   Elevated troponin 01/30/2017    Esophageal dysmotilities 10/16/2017   Fibromyalgia    qwhere (01/30/2017)   Gallstones    GERD (gastroesophageal reflux disease)    High cholesterol    History of kidney stones    History of stomach ulcers    Hx of squamous cell carcinoma excision    right mandible (2011), right arm (2014)   Hypertension    Hypertensive urgency 01/23/2024   Hyponatremia 07/20/2023   IBS (irritable bowel syndrome)    Lymphangitis 12/06/2018   Osteoarthritis    Pneumonia 2000s X 2   twice (01/30/2017)   Pyloric stenosis    Shortness of breath 01/23/2024   Stroke (HCC) 10/2014   denies residual on 01/30/2017   Past Surgical History:  Procedure Laterality Date   APPENDECTOMY     CARDIOVASCULAR STRESS TEST  04/2016   nuclear cardiolite stress test done in [place]  (no ischemia, no LVH, no LV systolic function)   CATARACT EXTRACTION W/ INTRAOCULAR LENS  IMPLANT, BILATERAL Bilateral    CHOLECYSTECTOMY OPEN     COLONOSCOPY  2009, 2014   DIAGNOSTIC MAMMOGRAM  2009, 2012   TONSILLECTOMY     Family History  Problem Relation Age of Onset   Stroke Mother    Heart attack Mother    Stroke Father    Heart attack Father    Heart attack Maternal Grandmother    Stroke Maternal Grandmother    Stroke Paternal Grandmother    Heart attack Paternal Grandmother    Heart attack Maternal Grandfather    Stroke Maternal Grandfather    Stroke Paternal Grandfather    Heart attack Paternal Grandfather    Colon cancer Neg Hx    Pancreatic cancer Neg Hx    Stomach cancer Neg Hx    Esophageal cancer Neg Hx    Current Medications[1] Allergies[2]   ROS: A complete ROS was performed with pertinent positives/negatives noted in the HPI. The remainder of the ROS are negative.    Objective:   Today's Vitals   12/02/24 1035  BP: 116/66  Pulse: (!) 54  Temp: 97.8 F (36.6 C)  TempSrc: Temporal  SpO2: 98%  Weight: 100 lb 9.6 oz (45.6 kg)  Height: 5' (1.524 m)    GENERAL: Well-appearing, in NAD. Well  nourished.  SKIN: Pink, warm, dry. Thin skin. 3 cm x3 cm partial thickness wound with subcutaneous fat exposure on left lateral leg. Mild-moderate amount of serous drainage from wound. No warmth, redness. See media tab for picture of wound.  NECK: Trachea midline. Full ROM w/o pain or tenderness. No lymphadenopathy.  RESPIRATORY: Chest wall symmetrical. Respirations even and non-labored. Breath sounds clear to auscultation bilaterally.  CARDIAC: S1, S2 present, regular rate and rhythm. Peripheral pulses 2+ bilaterally.  MSK: Muscle tone and strength appropriate for age. Joints w/o tenderness, redness, or swelling. EXTREMITIES: Without clubbing, cyanosis, or edema.  NEUROLOGIC: No motor or sensory deficits. Steady, even gait.  PSYCH/MENTAL STATUS: Alert, oriented x 3. Cooperative, appropriate mood and affect.    No results found for any visits on 12/02/24.    Assessment & Plan:   Assessment and Plan    Left lower leg skin tear with subcutaneous fat exposure Sustained a 3x3 cm partial thickness wound  with subcutaneous fat exposure on the left lateral leg. Wound oozing clear serous drainage, no signs of infection. Healing may be prolonged due to venous insufficiency. - Continue doxycycline  twice daily for 10 days. - Apply Neosporin to wound. - Change dressing when saturated or dirty.  - Elevate feet to reduce swelling  - Rewrapped wound with nonadherent gauze and gauze wrap. Advised family to use non-adherent dressing to prevent further skin tears. Tegaderm dressing applied from ortho appt will cause skin to tear further - discontinued use.   Chronic venous insufficiency of lower extremity Chronic venous insufficiency causing lower extremity swelling, exacerbating oozing from skin tear due to fluid accumulation. - Elevate feet to reduce swelling. - continue follow up with wound care as scheduled on 12/16/23.      No orders of the defined types were placed in this encounter.  No orders of  the defined types were placed in this encounter.  Lab Orders  No laboratory test(s) ordered today   No images are attached to the encounter or orders placed in the encounter.  Return for Scheduled Routine Office Visits and as needed.   Rosina Senters, FNP     [1]  Current Outpatient Medications:    acetaminophen  (TYLENOL ) 650 MG CR tablet, Take 650 mg by mouth every 8 (eight) hours as needed for pain., Disp: , Rfl:    ALPRAZolam  (XANAX ) 0.5 MG tablet, Take 1 tablet (0.5 mg total) by mouth daily as needed for anxiety., Disp: 30 tablet, Rfl: 5   aspirin  EC 81 MG tablet, Take 81 mg by mouth daily. Swallow whole., Disp: , Rfl:    cholestyramine  (QUESTRAN ) 4 g packet, DISSOLVE & TAKE 1 POWDER BY MOUTH TWICE DAILY, Disp: 60 each, Rfl: 0   dicyclomine  (BENTYL ) 10 MG capsule, Take 1 capsule by mouth twice daily, Disp: 180 capsule, Rfl: 0   diphenoxylate -atropine  (LOMOTIL ) 2.5-0.025 MG tablet, TAKE 1 TABLET BY MOUTH TWICE DAILY FOR DIARRHEA OR LOOSE STOOL, Disp: 60 tablet, Rfl: 2   doxycycline  (VIBRA -TABS) 100 MG tablet, Take 1 tablet (100 mg total) by mouth 2 (two) times daily., Disp: 20 tablet, Rfl: 0   DULoxetine  (CYMBALTA ) 20 MG capsule, Take 1 capsule by mouth once daily, Disp: 30 capsule, Rfl: 0   famotidine  (PEPCID ) 20 MG tablet, Take 1 tablet by mouth once daily, Disp: 90 tablet, Rfl: 0   fexofenadine (ALLEGRA) 180 MG tablet, Take 180 mg by mouth daily., Disp: , Rfl:    fluticasone  (FLONASE ) 50 MCG/ACT nasal spray, Use 1 spray(s) in each nostril twice daily, Disp: 16 g, Rfl: 0   furosemide  (LASIX ) 20 MG tablet, Take 1 tablet by mouth once daily, Disp: 90 tablet, Rfl: 0   IRON PO, Take 65 mg by mouth 3 (three) times a week., Disp: , Rfl:    losartan  (COZAAR ) 50 MG tablet, Take 1 tablet (50 mg total) by mouth daily., Disp: 90 tablet, Rfl: 3   metoprolol  succinate (TOPROL -XL) 25 MG 24 hr tablet, Take 1 tablet by mouth once daily, Disp: 90 tablet, Rfl: 0   potassium chloride  SA (KLOR-CON  M) 20  MEQ tablet, Take 1 tablet (20 mEq total) by mouth daily., Disp: 90 tablet, Rfl: 1   Vitamin D , Ergocalciferol , (DRISDOL ) 1.25 MG (50000 UNIT) CAPS capsule, Take 1 capsule by mouth once a week, Disp: 12 capsule, Rfl: 0 [2]  Allergies Allergen Reactions   Butrans [Buprenorphine] Other (See Comments)    Severe chest pains   Codeine Other (See Comments)    Severe Chest  pains  Other Reaction(s): severe chest pain   Morphine And Codeine Other (See Comments)    Severe chest pains   Morphine Sulfate     Other Reaction(s): severe chest pain    "

## 2024-12-02 NOTE — Patient Instructions (Signed)
 Continue antibiotic (doxycycline ) as prescribed  Neosporin to wound 2 times a day  Change dressing when dirty or saturated Keep follow up appointment with wound care as scheduled

## 2024-12-02 NOTE — Telephone Encounter (Signed)
 Spoke to patient's daughter and was told someone sent these refills in yesterday.  They have been picked up

## 2024-12-03 ENCOUNTER — Encounter: Payer: Self-pay | Admitting: Internal Medicine

## 2024-12-03 ENCOUNTER — Other Ambulatory Visit: Payer: Self-pay | Admitting: Family

## 2024-12-03 ENCOUNTER — Encounter: Payer: Self-pay | Admitting: Nurse Practitioner

## 2024-12-03 DIAGNOSIS — Z111 Encounter for screening for respiratory tuberculosis: Secondary | ICD-10-CM

## 2024-12-03 NOTE — Telephone Encounter (Signed)
Can you please order the test .

## 2024-12-03 NOTE — Telephone Encounter (Signed)
 Requesting: Dicyclomine  HCl 10 MG Oral Capsule  Last Visit: 11/03/2024 Next Visit: 02/04/2025 Last Refill: 08/20/2024  Please Advise

## 2024-12-05 ENCOUNTER — Telehealth: Payer: Self-pay | Admitting: Nurse Practitioner

## 2024-12-05 ENCOUNTER — Telehealth: Payer: Self-pay

## 2024-12-05 ENCOUNTER — Other Ambulatory Visit (INDEPENDENT_AMBULATORY_CARE_PROVIDER_SITE_OTHER)

## 2024-12-05 DIAGNOSIS — Z111 Encounter for screening for respiratory tuberculosis: Secondary | ICD-10-CM | POA: Diagnosis not present

## 2024-12-05 NOTE — Telephone Encounter (Signed)
 Copied from CRM [id number]. Topic: Clinical - Medication Prior Auth >> Dec 05, 2024  3:48 PM Alfonso HERO wrote: Reason for CRM: patients daughter called saying the pharmacy informed her that a PA is needed for the Dicyclomine  HCl 10 mg Oral.

## 2024-12-05 NOTE — Telephone Encounter (Signed)
 Called patient and informed her daughter Barnie Arts who is on DPR that the PA request was sent to our PA team and that I will be in contact with throughout the process. She thanked me for calling and verbalized understanding

## 2024-12-05 NOTE — Telephone Encounter (Signed)
 Created a separate telephone encounter to PA team

## 2024-12-05 NOTE — Telephone Encounter (Signed)
 Per telephone call PA is needed for Dicyclomine  HCl 10 mg Oral

## 2024-12-07 LAB — QUANTIFERON-TB GOLD PLUS
Mitogen-NIL: 8.58 [IU]/mL
NIL: 0.01 [IU]/mL
QuantiFERON-TB Gold Plus: NEGATIVE
TB1-NIL: 0 [IU]/mL
TB2-NIL: 0 [IU]/mL

## 2024-12-08 ENCOUNTER — Telehealth: Payer: Self-pay

## 2024-12-08 ENCOUNTER — Other Ambulatory Visit (HOSPITAL_COMMUNITY): Payer: Self-pay

## 2024-12-08 NOTE — Telephone Encounter (Signed)
 Called the number listed for paitent and it was to her daughter Susan [name] who is on DPR on file. I informed her that the medication Dicyclomine  10 mg has been approved by insurance. She thanked me for calling and verbalized understanding. All (if any) questions were answered.

## 2024-12-08 NOTE — Telephone Encounter (Signed)
 Pharmacy Patient Advocate Encounter  Received notification from [employer] that Prior Authorization for  Dicyclomine  10 mg capsule has been APPROVED from 12/04/2024 to 12/03/2025. Ran test claim, Copay is $5.10. This test claim was processed through [facility]- copay amounts may vary at other pharmacies due to pharmacy/plan contracts, or as the patient moves through the different stages of their insurance plan.   PA #/Case ID/Reference #: [id number]

## 2024-12-08 NOTE — Telephone Encounter (Signed)
 Pharmacy Patient Advocate Encounter   Received notification from Physician's Office that prior authorization for Dicyclomine  10 mg capsule is required/requested.   Insurance verification completed.   The patient is insured through [place].   Per test claim: PA required; PA submitted to above mentioned insurance via Latent Key/confirmation #/EOC [id number] Status is pending

## 2024-12-09 ENCOUNTER — Ambulatory Visit: Payer: Self-pay | Admitting: Family

## 2024-12-11 ENCOUNTER — Encounter: Payer: Self-pay | Admitting: Nurse Practitioner

## 2024-12-11 DIAGNOSIS — F411 Generalized anxiety disorder: Secondary | ICD-10-CM

## 2024-12-12 MED ORDER — ALPRAZOLAM 0.5 MG PO TABS: 0.5000 mg | ORAL_TABLET | Freq: Every day | ORAL | 0 refills | Status: AC | PRN

## 2024-12-12 NOTE — Telephone Encounter (Signed)
 Copied from CRM [id number]. Topic: Clinical - Medication Question >> Dec 12, 2024 11:26 AM Thersia BROCKS wrote: Reason for CRM: Patient has been moved into a memory care facilty , has their own pharmacy are requesting ALPRAZolam  (XANAX ) 0.5 MG tablet needs the prescription in writing since it is a controlled substance , needs it to on paper, daugther states she will come pick it up

## 2024-12-12 NOTE — Telephone Encounter (Signed)
 Daughter Susan [name] called and informed that she can come pick up the Rx. She thanked me for calling and all (if any) questions were answered.

## 2024-12-12 NOTE — Telephone Encounter (Signed)
 Called patient's daughter Barnie Arts who is [place]. I informed her that I will have to had DOD-Doctor of the Day to review this request and get back with her. She asked if it could be done by end of day that would be great due to her mother is getting low on current medication that was on hand.

## 2024-12-15 ENCOUNTER — Encounter (HOSPITAL_BASED_OUTPATIENT_CLINIC_OR_DEPARTMENT_OTHER): Admitting: Internal Medicine

## 2024-12-22 ENCOUNTER — Encounter (HOSPITAL_BASED_OUTPATIENT_CLINIC_OR_DEPARTMENT_OTHER): Attending: Internal Medicine | Admitting: Internal Medicine

## 2024-12-22 DIAGNOSIS — I87312 Chronic venous hypertension (idiopathic) with ulcer of left lower extremity: Secondary | ICD-10-CM | POA: Insufficient documentation

## 2024-12-22 DIAGNOSIS — T798XXA Other early complications of trauma, initial encounter: Secondary | ICD-10-CM | POA: Diagnosis not present

## 2024-12-22 DIAGNOSIS — L97822 Non-pressure chronic ulcer of other part of left lower leg with fat layer exposed: Secondary | ICD-10-CM | POA: Diagnosis not present

## 2024-12-23 ENCOUNTER — Encounter: Payer: Self-pay | Admitting: Internal Medicine

## 2024-12-23 ENCOUNTER — Ambulatory Visit: Admitting: Internal Medicine

## 2024-12-23 VITALS — BP 116/78 | HR 89 | Ht 60.0 in | Wt 94.5 lb

## 2024-12-23 DIAGNOSIS — R195 Other fecal abnormalities: Secondary | ICD-10-CM | POA: Diagnosis not present

## 2024-12-23 DIAGNOSIS — K6289 Other specified diseases of anus and rectum: Secondary | ICD-10-CM

## 2024-12-23 DIAGNOSIS — K580 Irritable bowel syndrome with diarrhea: Secondary | ICD-10-CM

## 2024-12-23 DIAGNOSIS — R159 Full incontinence of feces: Secondary | ICD-10-CM | POA: Diagnosis not present

## 2024-12-23 DIAGNOSIS — K2100 Gastro-esophageal reflux disease with esophagitis, without bleeding: Secondary | ICD-10-CM

## 2024-12-23 DIAGNOSIS — K529 Noninfective gastroenteritis and colitis, unspecified: Secondary | ICD-10-CM

## 2024-12-23 DIAGNOSIS — R1319 Other dysphagia: Secondary | ICD-10-CM

## 2024-12-23 MED ORDER — DIPHENOXYLATE-ATROPINE 2.5-0.025 MG PO TABS: 1.0000 | ORAL_TABLET | Freq: Three times a day (TID) | ORAL | 3 refills | Status: AC

## 2024-12-23 MED ORDER — CHOLESTYRAMINE 4 G PO PACK: 4.0000 g | PACK | Freq: Two times a day (BID) | ORAL | 3 refills | Status: AC

## 2024-12-23 NOTE — Patient Instructions (Signed)
 We have sent the following medications to your pharmacy for you to pick up at your convenience:  Questran , Lomotil  - if patient starts to get constipated she can back off each medication .  _______________________________________________________  If your blood pressure at your visit was 140/90 or greater, please contact your primary care physician to follow up on this.  _______________________________________________________  If you are age 89 or older, your body mass index should be between 23-30. Your Body mass index is 18.46 kg/m. If this is out of the aforementioned range listed, please consider follow up with your Primary Care Provider.  If you are age 62 or younger, your body mass index should be between 19-25. Your Body mass index is 18.46 kg/m. If this is out of the aformentioned range listed, please consider follow up with your Primary Care Provider.   ________________________________________________________  The [facility] providers would like to encourage you to use MYCHART to communicate with providers for non-urgent requests or questions.  Due to long hold times on the telephone, sending your provider a message by [facility] may be a faster and more efficient way to get a response.  Please allow 48 business hours for a response.  Please remember that this is for non-urgent requests.  _______________________________________________________  [facility] is using a team-based approach to care.  Your team is made up of your doctor and two to three APPS. Our APPS (Nurse Practitioners and Physician Assistants) work with your physician to ensure care continuity for you. They are fully qualified to address your health concerns and develop a treatment plan. They communicate directly with your gastroenterologist to care for you. Seeing the Advanced Practice Practitioners on your physician's team can help you by facilitating care more promptly, often allowing for earlier appointments,  access to diagnostic testing, procedures, and other specialty referrals.

## 2024-12-23 NOTE — Progress Notes (Signed)
 HISTORY OF PRESENT ILLNESS:  Susan [name] is a 89 y.o. female with past medical history as listed below.  She presents today with her daughter regarding worsening diarrhea with incontinence.  I last saw the patient December 13, 2020 for follow-up regarding the same issues.  She is known to have decreased rectal tone.  She has been using Questran  4 g packets twice daily and Lomotil  twice daily.  For the most part, this has controlled her problems nicely.  Occasionally she will get constipated and hold off on Questran  therapy.  More recently she moved into an assisted living facility.  She states that her diarrhea is worse as is incontinence.  She seems to be taking both Questran  and Lomotil  twice daily.  Her medications were administered by the nursing facility.  No abdominal pain or bleeding.  She does complain of poor appetite.  Just not interested in eating.  Associated with this has been some weight loss.  She is accompanied today by her daughter.  REVIEW OF SYSTEMS:  All non-GI ROS negative except for arthritis  Past Medical History:  Diagnosis Date   Actinic keratosis, hx of    chest wall (2012)   Allergic rhinitis    Anxiety    Arthritis    spine, hips (01/30/2017)   Bursitis    Cellulitis 07/20/2023   Cellulitis of right foot 01/30/2017   Colon polyps    Diverticulosis    Elevated brain natriuretic peptide (BNP) level 01/23/2024   Elevated troponin 01/30/2017   Esophageal dysmotilities 10/16/2017   Fibromyalgia    qwhere (01/30/2017)   Gallstones    GERD (gastroesophageal reflux disease)    High cholesterol    History of kidney stones    History of stomach ulcers    Hx of squamous cell carcinoma excision    right mandible (2011), right arm (2014)   Hypertension    Hypertensive urgency 01/23/2024   Hyponatremia 07/20/2023   IBS (irritable bowel syndrome)    Lymphangitis 12/06/2018   Osteoarthritis    Pneumonia 2000s X 2   twice (01/30/2017)   Pyloric  stenosis    Shortness of breath 01/23/2024   Stroke (HCC) 10/2014   denies residual on 01/30/2017    Past Surgical History:  Procedure Laterality Date   APPENDECTOMY     CARDIOVASCULAR STRESS TEST  04/2016   nuclear cardiolite stress test done in [place]  (no ischemia, no LVH, no LV systolic function)   CATARACT EXTRACTION W/ INTRAOCULAR LENS  IMPLANT, BILATERAL Bilateral    CHOLECYSTECTOMY OPEN     COLONOSCOPY  2009, 2014   DIAGNOSTIC MAMMOGRAM  2009, 2012   TONSILLECTOMY      Social History Susan [name]  reports that she has never smoked. She has never used smokeless tobacco. She reports that she does not currently use alcohol. She reports that she does not use drugs.  family history includes Heart attack in her father, maternal grandfather, maternal grandmother, mother, paternal grandfather, and paternal grandmother; Stroke in her father, maternal grandfather, maternal grandmother, mother, paternal grandfather, and paternal grandmother.  Allergies[1]     PHYSICAL EXAMINATION: Vital signs: BP 116/78   Pulse 89   Ht 5' (1.524 m)   Wt 94 lb 8 oz (42.9 kg)   BMI 18.46 kg/m   Constitutional: generally well-appearing, no acute distress Psychiatric: alert and oriented x3, cooperative Eyes: extraocular movements intact, anicteric, conjunctiva pink Mouth: oral pharynx moist, no lesions Neck: supple no lymphadenopathy Cardiovascular: heart regular rate and rhythm, systolic  murmur Lungs: clear to auscultation bilaterally Abdomen: soft, nontender, nondistended, no obvious ascites, no peritoneal signs, normal bowel sounds, no organomegaly Rectal: Omitted Extremities: no clubbing or cyanosis.  No lower extremity edema bilaterally.  Left leg wrapped (apparently has ulcer) Skin: no lesions on visible extremities Neuro: No focal deficits.  Cranial nerves intact  ASSESSMENT:  1.  Chronic loose stools with incontinence.  Worsened 2.  Decreased rectal sphincter tone 3.   Advanced age   PLAN:  1.  Continue Questran  twice daily.  Hold for constipation 2.  Increase Lomotil  to 3 times daily.  Hold for constipation. 3.  Add Citrucel powder in order to bulk up stools 4.  Instructions provided to assisted care living facility. 5.  GI follow-up as needed A total time of 45 minutes was spent preparing to see the patient, obtaining comprehensive history, performing medically appropriate physical exam, counseling and educating the patient and her daughter regarding the above listed issues, directing medical therapies, prescribing medications, and documenting clinical information in the health record         [1]  Allergies Allergen Reactions   Butrans [Buprenorphine] Other (See Comments)    Severe chest pains   Codeine Other (See Comments)    Severe Chest pains  Other Reaction(s): severe chest pain   Morphine And Codeine Other (See Comments)    Severe chest pains   Morphine Sulfate     Other Reaction(s): severe chest pain

## 2024-12-25 ENCOUNTER — Encounter: Payer: Self-pay | Admitting: Nurse Practitioner

## 2024-12-25 DIAGNOSIS — M1610 Unilateral primary osteoarthritis, unspecified hip: Secondary | ICD-10-CM

## 2024-12-25 DIAGNOSIS — M797 Fibromyalgia: Secondary | ICD-10-CM

## 2024-12-25 DIAGNOSIS — M170 Bilateral primary osteoarthritis of knee: Secondary | ICD-10-CM

## 2024-12-25 MED ORDER — ACETAMINOPHEN ER 650 MG PO TBCR: 650.0000 mg | EXTENDED_RELEASE_TABLET | Freq: Three times a day (TID) | ORAL | 3 refills | Status: AC

## 2024-12-25 NOTE — Telephone Encounter (Signed)
 Faxed Rx for Acetaminophen  (Tylenol ) 650 mg to [facility]. Received successful confirmation page.

## 2024-12-29 ENCOUNTER — Encounter (HOSPITAL_BASED_OUTPATIENT_CLINIC_OR_DEPARTMENT_OTHER): Admitting: Internal Medicine

## 2024-12-30 ENCOUNTER — Encounter (HOSPITAL_BASED_OUTPATIENT_CLINIC_OR_DEPARTMENT_OTHER): Admitting: Internal Medicine

## 2024-12-30 DIAGNOSIS — L97822 Non-pressure chronic ulcer of other part of left lower leg with fat layer exposed: Secondary | ICD-10-CM | POA: Diagnosis not present

## 2024-12-30 DIAGNOSIS — I87312 Chronic venous hypertension (idiopathic) with ulcer of left lower extremity: Secondary | ICD-10-CM | POA: Diagnosis not present

## 2024-12-30 NOTE — Progress Notes (Signed)
 "    Assessment/Plan:   Dementia likely due to Alzheimer disease    Susan [name] is a very pleasant 89 y.o. year old RH female with a history of hypertension, hyperlipidemia, carotid artery disease, IBS, GERD, presents disease, fibromyalgia, anxiety, very HOH, CVA in the mid 2010s seen today for evaluation of memory loss. Unable to perform MoCA today. MMSE 18/30.  In view of her performance, most recent information including CT of the head from November 2025 showing cerebral atrophy with some hippocampal atrophy, family history, the etiology is likely due to Alzheimer's disease.  We discussed all of the results and findings with her daughter Rock who supplements the history, who agrees that the risks of initiating antidementia medication, along with advanced age, and unlikely to be of therapeutic results, would outweigh the benefits of receiving treatment.  She also agrees with no further imaging or invasive procedures.  The patient needs assistance with some ADLs.  Mood is controlled by her PCP.  No further testing or follow-up is indicated.  All questions and concerns were answered to their satisfaction     Subjective:     Discussed the use of AI scribe software for clinical note transcription with the patient, who gave verbal consent to proceed.  History of Present Illness Susan [name] is a 89 year old right-handed female with progressive cognitive impairment presenting for evaluation of worsening memory loss and functional decline.  Over the past year, there has been significant progression of cognitive and functional decline. She was diagnosed with mild cognitive impairment in 2021 at [facility] but it was felt to be attributed to aging and remained socially active and independent with activities of daily living until recently. She has difficulty recalling the current year and month, though she can eventually recall her city. She often experiences word-finding difficulty,  particularly with names and places.Since October 2025, she has experienced three episodes of acute overnight delirium, each approximately one week apart, followed by rapid deterioration in memory and daily functioning. In September 2025, she was able to participate in complex activities such as a five-hour strategy game, but by late October and early November, she developed wandering behaviors, difficulty locating her room, and attempts to leave her independent living facility. She now resides in a secured assisted living facility.  She exhibits frequent short-interval repetition of questions and information (every 5-10 minutes), episodes of disorientation--particularly nocturnally--expressing a desire to go home despite being in her residence, and misplaces items such as hearing aids and telephones, often placing them in unusual locations. She moves valued items to perceived safe or accessible locations. There has been a single episode of paranoia during a stressful period, with beliefs that family members had stolen from her. Visual hallucinations have occurred, particularly during delirium, including seeing people in her room and believing someone was trying to harm a baby; she was able to recall these hallucinations in detail the following day, though her current recall is limited. No auditory hallucinations have been reported. She has a remote history of ischemic stroke in the mid-2010s, resulting in right hand weakness and requiring physical therapy; no speech or leg involvement was recalled. Brain imaging has shown chronic small vessel ischemic changes and evidence of a prior small stroke, no acute findings.   Sleep is disrupted; she takes alprazolam  at night to aid sleep and has recently been sleeping in her chair rather than her bed for unclear reasons. She denies nightmares or vivid dreams. There is no known history of sleep apnea or  CPAP use.  Functional decline is evident, with increased need for  assistance with hygiene and bathing, partly due to discomfort while showering. She continues to select her own clothes and maintains a desire to look presentable, though family may assist if rushed. Appetite is decreased, attributed to poor food quality, but there is no change in sense of smell or taste. She has experienced significant weight loss and reduced mobility.  Urinary incontinence persists, requiring pads, with the last urinary tract infection in 2024 requiring hospitalization. Chronic diarrhea and fecal incontinence continue daily despite prior gastroenterology evaluation and medication adjustments.  No history of seizures, epilepsy, tremor, or Parkinsonism. No alcohol or tobacco use reported.     She has a history of blepharospasm and dry eyes, with prior cataract surgery more than eight years ago. She declined Botox treatment for blepharospasm.   Family history is notable for dementia in her mother and grandmother, possibly Alzheimer's disease.  Retired from designer, industrial/product work.    Allergies[1]  Current Outpatient Medications  Medication Instructions   acetaminophen  (TYLENOL ) 650 mg, Oral, Every 8 hours   ALPRAZolam  (XANAX ) 0.5 mg, Oral, Daily PRN   aspirin  EC 81 mg, Daily   cholestyramine  (QUESTRAN ) 4 g, Oral, 2 times daily   dicyclomine  (BENTYL ) 10 mg, Oral, 2 times daily   diphenoxylate -atropine  (LOMOTIL ) 2.5-0.025 MG tablet 1 tablet, Oral, 3 times daily   doxycycline  (VIBRA -TABS) 100 mg, Oral, 2 times daily   DULoxetine  (CYMBALTA ) 20 mg, Oral, Daily   famotidine  (PEPCID ) 20 mg, Oral, Daily   fexofenadine (ALLEGRA) 180 mg, Daily   fluticasone  (FLONASE ) 50 MCG/ACT nasal spray Use 1 spray(s) in each nostril twice daily   furosemide  (LASIX ) 20 mg, Oral, Daily   IRON PO 65 mg, 3 times weekly   losartan  (COZAAR ) 50 mg, Oral, Daily   metoprolol  succinate (TOPROL -XL) 25 mg, Oral, Daily   potassium chloride  SA (KLOR-CON  M) 20 MEQ tablet 20 mEq, Oral, Daily   Vitamin D   (Ergocalciferol ) (DRISDOL ) 50,000 Units, Oral, Weekly     VITALS:   Vitals:   12/31/24 0934  BP: 95/61  Pulse: 82  Resp: 20  SpO2: 97%  Weight: 90 lb (40.8 kg)  Height: 5' (1.524 m)           No data to display             12/31/2024   12:00 PM 11/03/2024   11:12 AM 10/06/2024   10:39 AM  MMSE - Mini Mental State Exam  Orientation to time 0 5 4  Orientation to Place 1 4 4   Registration 3 3 2   Attention/ Calculation 5 5 5   Attention/Calculation-comments  Spelled WORLD backwards   Recall 2 3 2   Language- name 2 objects 2 2 2   Language- name 2 objects-comments  Pen, tissue box   Language- repeat 1 1 1   Language- follow 3 step command 2 3 3   Language- read & follow direction 1 1 1   Write a sentence 1 1 1   Copy design 0 0 1  Total score 18 28 26      Neurological Exam   Orientation:  Alert and oriented to person, not to place or  time. No aphasia or dysarthria. Fund of knowledge is reduced. Recent and remote memory impaired.  Attention and concentration are reduced.  Able to name objects and unable to repeat phrases. Delayed recall 2/3   Cranial nerves: There is good facial symmetry. Extraocular muscles are intact and visual fields are full to confrontational testing. Speech  is not very fluent, but clear, no tongue deviation. Hearing is very decreased to conversational tone. Tone: Tone is good throughout. Sensation: Sensation is intact to light touch and pinprick throughout. Vibration is intact at the bilateral big toe. Coordination: The patient has no difficulty with RAM's or FNF bilaterally. Normal finger to nose  Motor: Strength is 5/5 in the bilateral upper and lower extremities. There is no pronator drift. There are no fasciculations noted. DTR's: Deep tendon reflexes are 1/4 .  Plantar responses are downgoing bilaterally. Gait and Station: The patient is able to ambulate with some difficulty. Needs a walker to ambulate . Gait is cautious and narrow.      Thank  you for allowing us  the opportunity to participate in the care of this nice patient. Please do not hesitate to contact us  for any questions or concerns.   Total time spent on today's visit was 97 minutes dedicated to this patient today, preparing to see patient, examining the patient, ordering tests and/or medications and counseling the patient, documenting clinical information in the EHR or other health record, independently interpreting results and communicating results to the patient/family, discussing treatment and goals, answering patient's questions and coordinating care.  Cc:  Katheen Roselie Rockford, NP  Camie Sevin 12/31/2024 12:17 PM       [1]  Allergies Allergen Reactions   Butrans [Buprenorphine] Other (See Comments)    Severe chest pains   Codeine Other (See Comments)    Severe Chest pains  Other Reaction(s): severe chest pain   Morphine And Codeine Other (See Comments)    Severe chest pains   Morphine Sulfate     Other Reaction(s): severe chest pain    "

## 2024-12-31 ENCOUNTER — Other Ambulatory Visit: Payer: Self-pay | Admitting: Nurse Practitioner

## 2024-12-31 ENCOUNTER — Ambulatory Visit: Admitting: Physician Assistant

## 2024-12-31 ENCOUNTER — Encounter: Payer: Self-pay | Admitting: Physician Assistant

## 2024-12-31 ENCOUNTER — Ambulatory Visit

## 2024-12-31 VITALS — BP 95/61 | HR 82 | Resp 20 | Ht 60.0 in | Wt 90.0 lb

## 2024-12-31 DIAGNOSIS — G309 Alzheimer's disease, unspecified: Secondary | ICD-10-CM | POA: Diagnosis not present

## 2024-12-31 DIAGNOSIS — F0280 Dementia in other diseases classified elsewhere without behavioral disturbance: Secondary | ICD-10-CM | POA: Diagnosis not present

## 2024-12-31 NOTE — Patient Instructions (Signed)
 It was a pleasure to see you today at our office.   Recommendations:   No follow up is indicated  Recommend visiting the website :  Dementia Success [facility] to better understand some behaviors related to memory loss.  For psychiatric meds, mood meds: Please have your primary care physician manage these medications.  If you have any severe symptoms of a stroke, or other severe issues such as confusion,severe chills or fever, etc call 911 or go to the ER as you may need to be evaluated further   [email or web address]   RECOMMENDATIONS FOR ALL PATIENTS WITH MEMORY PROBLEMS: 1. Continue to exercise (Recommend 30 minutes of walking everyday, or 3 hours every week) 2. Increase social interactions - continue going to [place] and enjoy social gatherings with friends and family 3. Eat healthy, avoid fried foods and eat more fruits and vegetables 4. Maintain adequate blood pressure, blood sugar, and blood cholesterol level. Reducing the risk of stroke and cardiovascular disease also helps promoting better memory. 5. Avoid stressful situations. Live a simple life and avoid aggravations. Organize your time and prepare for the next day in anticipation. 6. Sleep well, avoid any interruptions of sleep and avoid any distractions in the bedroom that may interfere with adequate sleep quality 7. Avoid sugar, avoid sweets as there is a strong link between excessive sugar intake, diabetes, and cognitive impairment We discussed the Mediterranean diet, which has been shown to help patients reduce the risk of progressive memory disorders and reduces cardiovascular risk. This includes eating fish, eat fruits and green leafy vegetables, nuts like almonds and hazelnuts, walnuts, and also use olive oil. Avoid fast foods and fried foods as much as possible. Avoid sweets and sugar as sugar use has been linked to worsening of memory function.  There is always a concern of  gradual progression of memory problems. If this is the case, then we may need to adjust level of care according to patient needs. Support, both to the patient and caregiver, should then be put into place.      You have been referred for a neuropsychological evaluation (i.e., evaluation of memory and thinking abilities). Please bring someone with you to this appointment if possible, as it is helpful for the doctor to hear from both you and another adult who knows you well. Please bring eyeglasses and hearing aids if you wear them.    The evaluation will take approximately 3 hours and has two parts:   The first part is a clinical interview with the neuropsychologist (Dr. Richie or Dr. Gayland). During the interview, the neuropsychologist will speak with you and the individual you brought to the appointment.    The second part of the evaluation is testing with the doctor's technician Neal or Luke). During the testing, the technician will ask you to remember different types of material, solve problems, and answer some questionnaires. Your family member will not be present for this portion of the evaluation.   Please note: We must reserve several hours of the neuropsychologist's time and the psychometrician's time for your evaluation appointment. As such, there is a No-Show fee of $100. If you are unable to attend any of your appointments, please contact our office as soon as possible to reschedule.      DRIVING: Regarding driving, in patients with progressive memory problems, driving will be impaired. We advise to have someone else do the driving if trouble finding directions or if minor accidents are reported. Independent driving assessment is available to determine safety  of driving.   If you are interested in the driving assessment, you can contact the following:  [facility] [employer] [facility] in [place] [phone]  [facility] [phone]  [facility]  [phone]  [facility] [phone] or [phone]   FALL PRECAUTIONS: Be cautious when walking. Scan the area for obstacles that may increase the risk of trips and falls. When getting up in the mornings, sit up at the edge of the bed for a few minutes before getting out of bed. Consider elevating the bed at the head end to avoid drop of blood pressure when getting up. Walk always in a well-lit room (use night lights in the walls). Avoid area rugs or power cords from appliances in the middle of the walkways. Use a walker or a cane if necessary and consider physical therapy for balance exercise. Get your eyesight checked regularly.  FINANCIAL OVERSIGHT: Supervision, especially oversight when making financial decisions or transactions is also recommended.  HOME SAFETY: Consider the safety of the kitchen when operating appliances like stoves, microwave oven, and blender. Consider having supervision and share cooking responsibilities until no longer able to participate in those. Accidents with firearms and other hazards in the house should be identified and addressed as well.   ABILITY TO BE LEFT ALONE: If patient is unable to contact 911 operator, consider using LifeLine, or when the need is there, arrange for someone to stay with patients. Smoking is a fire hazard, consider supervision or cessation. Risk of wandering should be assessed by caregiver and if detected at any point, supervision and safe proof recommendations should be instituted.  MEDICATION SUPERVISION: Inability to self-administer medication needs to be constantly addressed. Implement a mechanism to ensure safe administration of the medications.      Mediterranean Diet A Mediterranean diet refers to food and lifestyle choices that are based on the traditions of countries located on the [place]. This way of eating has been shown to help prevent certain conditions and improve outcomes for people who have chronic  diseases, like kidney disease and heart disease. What are tips for following this plan? Lifestyle  Cook and eat meals together with your family, when possible. Drink enough fluid to keep your urine clear or pale yellow. Be physically active every day. This includes: Aerobic exercise like running or swimming. Leisure activities like gardening, walking, or housework. Get 7-8 hours of sleep each night. If recommended by your health care provider, drink red wine in moderation. This means 1 glass a day for nonpregnant women and 2 glasses a day for men. A glass of wine equals 5 oz (150 mL). Reading food labels  Check the serving size of packaged foods. For foods such as rice and pasta, the serving size refers to the amount of cooked product, not dry. Check the total fat in packaged foods. Avoid foods that have saturated fat or trans fats. Check the ingredients list for added sugars, such as corn syrup. Shopping  At the grocery store, buy most of your food from the areas near the walls of the store. This includes: Fresh fruits and vegetables (produce). Grains, beans, nuts, and seeds. Some of these may be available in unpackaged forms or large amounts (in bulk). Fresh seafood. Poultry and eggs. Low-fat dairy products. Buy whole ingredients instead of prepackaged foods. Buy fresh fruits and vegetables in-season from local farmers markets. Buy frozen fruits and vegetables in resealable bags. If you do not have access to quality fresh seafood, buy precooked frozen shrimp or  canned fish, such as tuna, salmon, or sardines. Buy small amounts of raw or cooked vegetables, salads, or olives from the deli or salad bar at your store. Stock your pantry so you always have certain foods on hand, such as olive oil, canned tuna, canned tomatoes, rice, pasta, and beans. Cooking  Cook foods with extra-virgin olive oil instead of using butter or other vegetable oils. Have meat as a side dish, and have vegetables  or grains as your main dish. This means having meat in small portions or adding small amounts of meat to foods like pasta or stew. Use beans or vegetables instead of meat in common dishes like chili or lasagna. Experiment with different cooking methods. Try roasting or broiling vegetables instead of steaming or sauteing them. Add frozen vegetables to soups, stews, pasta, or rice. Add nuts or seeds for added healthy fat at each meal. You can add these to yogurt, salads, or vegetable dishes. Marinate fish or vegetables using olive oil, lemon juice, garlic, and fresh herbs. Meal planning  Plan to eat 1 vegetarian meal one day each week. Try to work up to 2 vegetarian meals, if possible. Eat seafood 2 or more times a week. Have healthy snacks readily available, such as: Vegetable sticks with hummus. Greek yogurt. Fruit and nut trail mix. Eat balanced meals throughout the week. This includes: Fruit: 2-3 servings a day Vegetables: 4-5 servings a day Low-fat dairy: 2 servings a day Fish, poultry, or lean meat: 1 serving a day Beans and legumes: 2 or more servings a week Nuts and seeds: 1-2 servings a day Whole grains: 6-8 servings a day Extra-virgin olive oil: 3-4 servings a day Limit red meat and sweets to only a few servings a month What are my food choices? Mediterranean diet Recommended Grains: Whole-grain pasta. Brown rice. Bulgar wheat. Polenta. Couscous. Whole-wheat bread. [place] Madeira. Vegetables: Artichokes. Beets. Broccoli. Cabbage. Carrots. Eggplant. Green beans. Chard. Kale. Spinach. Onions. Leeks. Peas. Squash. Tomatoes. Peppers. Radishes. Fruits: Apples. Apricots. Avocado. Berries. Bananas. Cherries. Dates. Figs. Grapes. Lemons. Melon. Oranges. Peaches. Plums. Pomegranate. Meats and other protein foods: Beans. Almonds. Sunflower seeds. Pine nuts. Peanuts. Cod. Salmon. Scallops. Shrimp. Tuna. Tilapia. Clams. Oysters. Eggs. Dairy: Low-fat milk. Cheese. Greek yogurt. Beverages:  Water. Red wine. Herbal tea. Fats and oils: Extra virgin olive oil. Avocado oil. Grape seed oil. Sweets and desserts: Greek yogurt with honey. Baked apples. Poached pears. Trail mix. Seasoning and other foods: Basil. Cilantro. Coriander. Cumin. Mint. Parsley. Sage. Rosemary. Tarragon. Garlic. Oregano. Thyme. Pepper. Balsalmic vinegar. Tahini. Hummus. Tomato sauce. Olives. Mushrooms. Limit these Grains: Prepackaged pasta or rice dishes. Prepackaged cereal with added sugar. Vegetables: Deep fried potatoes (french fries). Fruits: Fruit canned in syrup. Meats and other protein foods: Beef. Pork. Lamb. Poultry with skin. Hot dogs. [place]. Dairy: Ice cream. Sour cream. Whole milk. Beverages: Juice. Sugar-sweetened soft drinks. Beer. Liquor and spirits. Fats and oils: Butter. Canola oil. Vegetable oil. Beef fat (tallow). Lard. Sweets and desserts: Cookies. Cakes. Pies. Candy. Seasoning and other foods: Mayonnaise. Premade sauces and marinades. The items listed may not be a complete list. Talk with your dietitian about what dietary choices are right for you. Summary The Mediterranean diet includes both food and lifestyle choices. Eat a variety of fresh fruits and vegetables, beans, nuts, seeds, and whole grains. Limit the amount of red meat and sweets that you eat. Talk with your health care provider about whether it is safe for you to drink red wine in moderation. This means 1 glass a  day for nonpregnant women and 2 glasses a day for men. A glass of wine equals 5 oz (150 mL). This information is not intended to replace advice given to you by your health care provider. Make sure you discuss any questions you have with your health care provider. Document Released: 07/13/2016 Document Revised: 08/15/2016 Document Reviewed: 07/13/2016 Elsevier Interactive Patient Education  2017 [employer].

## 2025-01-02 ENCOUNTER — Telehealth: Payer: Self-pay

## 2025-01-05 ENCOUNTER — Encounter (HOSPITAL_BASED_OUTPATIENT_CLINIC_OR_DEPARTMENT_OTHER): Admitting: Internal Medicine

## 2025-01-06 ENCOUNTER — Ambulatory Visit: Admitting: Internal Medicine

## 2025-01-06 NOTE — Telephone Encounter (Signed)
 Form faxed back to [facility] at [place] on Friday 01/02/25 by Avelina Finder, CMA

## 2025-01-12 ENCOUNTER — Encounter (HOSPITAL_BASED_OUTPATIENT_CLINIC_OR_DEPARTMENT_OTHER): Admitting: Internal Medicine

## 2025-01-19 ENCOUNTER — Encounter (HOSPITAL_BASED_OUTPATIENT_CLINIC_OR_DEPARTMENT_OTHER): Admitting: Internal Medicine

## 2025-01-26 ENCOUNTER — Ambulatory Visit: Admitting: Family Medicine

## 2025-01-27 ENCOUNTER — Encounter (HOSPITAL_BASED_OUTPATIENT_CLINIC_OR_DEPARTMENT_OTHER): Admitting: Internal Medicine

## 2025-02-04 ENCOUNTER — Ambulatory Visit: Admitting: Nurse Practitioner

## 2025-06-01 ENCOUNTER — Ambulatory Visit
